# Patient Record
Sex: Female | Born: 1935 | Race: Black or African American | Hispanic: No
Health system: Southern US, Community
[De-identification: ages and names within clinical notes are randomized; demographics above are authoritative.]

## PROBLEM LIST (undated history)

## (undated) DIAGNOSIS — E119 Type 2 diabetes mellitus without complications: Secondary | ICD-10-CM

## (undated) DIAGNOSIS — J449 Chronic obstructive pulmonary disease, unspecified: Secondary | ICD-10-CM

## (undated) DIAGNOSIS — I10 Essential (primary) hypertension: Secondary | ICD-10-CM

## (undated) DIAGNOSIS — N186 End stage renal disease: Secondary | ICD-10-CM

## (undated) DIAGNOSIS — N189 Chronic kidney disease, unspecified: Secondary | ICD-10-CM

## (undated) DIAGNOSIS — E785 Hyperlipidemia, unspecified: Secondary | ICD-10-CM

## (undated) HISTORY — PX: ABDOMINAL HYSTERECTOMY: SHX81

## (undated) HISTORY — DX: Hyperlipidemia, unspecified: E78.5

## (undated) HISTORY — DX: Type 2 diabetes mellitus without complications: E11.9

## (undated) HISTORY — DX: Chronic kidney disease, unspecified: N18.9

## (undated) HISTORY — DX: Chronic obstructive pulmonary disease, unspecified: J44.9

## (undated) HISTORY — DX: Essential (primary) hypertension: I10

## (undated) HISTORY — DX: End stage renal disease: N18.6

## (undated) HISTORY — PX: AV FISTULA PLACEMENT: SHX1204

---

## 2020-03-06 ENCOUNTER — Other Ambulatory Visit: Payer: Self-pay

## 2020-03-06 DIAGNOSIS — G458 Other transient cerebral ischemic attacks and related syndromes: Secondary | ICD-10-CM

## 2020-03-21 ENCOUNTER — Other Ambulatory Visit: Payer: Self-pay

## 2020-03-21 ENCOUNTER — Ambulatory Visit (INDEPENDENT_AMBULATORY_CARE_PROVIDER_SITE_OTHER): Payer: Medicare (Managed Care) | Admitting: Vascular Surgery

## 2020-03-21 ENCOUNTER — Encounter: Payer: Self-pay | Admitting: *Deleted

## 2020-03-21 ENCOUNTER — Other Ambulatory Visit: Payer: Self-pay | Admitting: Vascular Surgery

## 2020-03-21 ENCOUNTER — Ambulatory Visit (HOSPITAL_COMMUNITY)
Admission: RE | Admit: 2020-03-21 | Discharge: 2020-03-21 | Disposition: A | Discharge status: Home / Self Care | Payer: Medicare (Managed Care) | Source: Ambulatory Visit | Attending: Vascular Surgery | Admitting: Vascular Surgery

## 2020-03-21 ENCOUNTER — Other Ambulatory Visit: Payer: Self-pay | Admitting: *Deleted

## 2020-03-21 ENCOUNTER — Encounter: Payer: Self-pay | Admitting: Vascular Surgery

## 2020-03-21 VITALS — BP 126/76 | HR 83 | Temp 97.8°F | Resp 20 | Ht 64.0 in | Wt 148.0 lb

## 2020-03-21 DIAGNOSIS — N186 End stage renal disease: Secondary | ICD-10-CM

## 2020-03-21 DIAGNOSIS — G458 Other transient cerebral ischemic attacks and related syndromes: Secondary | ICD-10-CM | POA: Insufficient documentation

## 2020-03-21 DIAGNOSIS — Z992 Dependence on renal dialysis: Secondary | ICD-10-CM | POA: Diagnosis not present

## 2020-03-21 NOTE — Progress Notes (Signed)
REASON FOR CONSULT:    Steal symptoms left upper extremity.  The consult is requested by Dr. Corliss Parish.  ASSESSMENT & PLAN:   STEAL SYNDROME: This patient has steal symptoms related to her upper arm fistula in the left arm.  In addition the fistula has been difficult to cannulate and she had a tunneled dialysis catheter placed in June.  I have recommended that we proceed with a fistulogram to evaluate our options to try to salvage the fistula.  I think we could band the fistula and help relieve her steal symptoms but if the fistula currently is difficult to cannulate this would only make things worse.  The difficulty with cannulation may be related to the tortuosity of the vein.  Based on her fistulogram she may be a candidate to have the fistula revised and retunneled to straighten it at the same time as banding.  If the fistula is not salvageable and we would have to ligate the fistula in place access in the right arm.  Veins in the right arm are somewhat marginal.  I will make further recommendations pending results of her fistulogram which is scheduled for 03/30/2020.  I have reviewed the indications for the procedure and the potential complications with the patient and her daughter.  Deitra Mayo, MD Office: [phone]   HPI:   Stacey Dorsey is a pleasant 86 y.o. female, who had a left brachiocephalic fistula placed in [place] about a year and a half ago.  She was having paresthesias in her hand ever since the fistula was placed.  The symptoms have been stable over the last year and a half.  However she began dialysis 3 months ago and the pain was worse when she was on dialysis.  In addition they had a hard time cannulating her fistula and for this reason a tunneled dialysis catheter was placed on 01/05/2020.  She is continuing to have pain and paresthesias in her left hand.  She is had no recent uremic symptoms.  Specifically, she denies nausea, vomiting, fatigue, anorexia, or  palpitations.  Past Medical History:  Diagnosis Date  . Chronic kidney disease   . COPD (chronic obstructive pulmonary disease) ([place])   . Diabetes mellitus without complication ([place])   . Hyperlipidemia   . Hypertension     History reviewed. No pertinent family history.  SOCIAL HISTORY: Social History   Socioeconomic History  . Marital status: Married    Spouse name: Not on file  . Number of children: Not on file  . Years of education: Not on file  . Highest education level: Not on file  Occupational History  . Not on file  Tobacco Use  . Smoking status: Never Smoker  . Smokeless tobacco: Never Used  Vaping Use  . Vaping Use: Never used  Substance and Sexual Activity  . Alcohol use: Not on file    Comment: occasional wine  . Drug use: Never  . Sexual activity: Not on file  Other Topics Concern  . Not on file  Social History Narrative  . Not on file   Social Determinants of Health   Financial Resource Strain:   . Difficulty of Paying Living Expenses: Not on file  Food Insecurity:   . Worried About Charity fundraiser in the Last Year: Not on file  . Ran Out of Food in the Last Year: Not on file  Transportation Needs:   . Lack of Transportation (Medical): Not on file  . Lack of Transportation (Non-Medical):  Not on file  Physical Activity:   . Days of Exercise per Week: Not on file  . Minutes of Exercise per Session: Not on file  Stress:   . Feeling of Stress : Not on file  Social Connections:   . Frequency of Communication with Friends and Family: Not on file  . Frequency of Social Gatherings with Friends and Family: Not on file  . Attends Religious Services: Not on file  . Active Member of Clubs or Organizations: Not on file  . Attends Archivist Meetings: Not on file  . Marital Status: Not on file  Intimate Partner Violence:   . Fear of Current or Ex-Partner: Not on file  . Emotionally Abused: Not on file  . Physically Abused: Not on file  .  Sexually Abused: Not on file    Allergies  Allergen Reactions  . Penicillins Rash    Current Outpatient Medications  Medication Sig Dispense Refill  . albuterol (PROVENTIL) (2.5 MG/3ML) 0.083% nebulizer solution Take by nebulization.    Marland Kitchen allopurinol (ZYLOPRIM) 100 MG tablet Take 100 mg by mouth daily.    . carvedilol (COREG) 25 MG tablet Take by mouth.    . esomeprazole (NEXIUM) 20 MG capsule Take by mouth.    . fluticasone-salmeterol (ADVAIR HFA) 115-21 MCG/ACT inhaler Inhale 2 puffs into the lungs 2 (two) times daily.    . insulin degludec (TRESIBA FLEXTOUCH) 100 UNIT/ML FlexTouch Pen Inject into the skin.    . multivitamin (RENA-VIT) TABS tablet Take 1 tablet by mouth daily.    Marland Kitchen NIFEdipine (PROCARDIA XL/NIFEDICAL XL) 60 MG 24 hr tablet Take 60 mg by mouth daily.    Marland Kitchen rOPINIRole (REQUIP) 0.5 MG tablet Take 0.5 mg by mouth at bedtime as needed.    . simvastatin (ZOCOR) 20 MG tablet Take 20 mg by mouth at bedtime.    Marland Kitchen VITAMIN D PO Take by mouth.     No current facility-administered medications for this visit.    REVIEW OF SYSTEMS:  [X]  denotes positive finding, [ ]  denotes negative finding Cardiac  Comments:  Chest pain or chest pressure:    Shortness of breath upon exertion:    Short of breath when lying flat:    Irregular heart rhythm:        Vascular    Pain in calf, thigh, or hip brought on by ambulation:    Pain in feet at night that wakes you up from your sleep:     Blood clot in your veins:    Leg swelling:         Pulmonary    Oxygen at home:    Productive cough:     Wheezing:         Neurologic    Sudden weakness in arms or legs:     Sudden numbness in arms or legs:     Sudden onset of difficulty speaking or slurred speech:    Temporary loss of vision in one eye:     Problems with dizziness:         Gastrointestinal    Blood in stool:     Vomited blood:         Genitourinary    Burning when urinating:     Blood in urine:        Psychiatric     Major depression:         Hematologic    Bleeding problems:    Problems with blood clotting too easily:  Skin    Rashes or ulcers:        Constitutional    Fever or chills:     PHYSICAL EXAM:   Vitals:   03/21/20 1243  BP: 126/76  Pulse: 83  Resp: 20  Temp: 97.8 F (36.6 C)  SpO2: 96%  Weight: 67.1 kg  Height: 5\' 4"  (1.626 m)    GENERAL: The patient is a well-nourished female, in no acute distress. The vital signs are documented above. CARDIAC: There is a regular rate and rhythm.  VASCULAR: I do not detect carotid bruits. She has a palpable right radial pulse. I do not palpate a left radial pulse. She has a thrill in her proximal fistula.  The fistula was more difficult to follow distally.  The fistula is somewhat tortuous. She has a radial and ulnar signal in the left hand which is monophasic which augments significantly with compression of her fistula. PULMONARY: There is good air exchange bilaterally without wheezing or rales. ABDOMEN: Soft and non-tender with normal pitched bowel sounds.  MUSCULOSKELETAL: There are no major deformities or cyanosis. NEUROLOGIC: No focal weakness or paresthesias are detected. SKIN: There are no ulcers or rashes noted. PSYCHIATRIC: The patient has a normal affect.  DATA:    UPPER EXTREMITY VEIN MAP: I have independently interpreted her upper extremity vein map of the right upper extremity.  The diameters of the cephalic vein in the right upper extremity range from 0.15-0.27 cm.  Thus the vein is marginal in size.  The basilic vein in the right arm ranges from 0.32-0.38 cm.  Thus she might be a candidate for basilic vein transposition based on her vein map.

## 2020-03-28 ENCOUNTER — Other Ambulatory Visit (HOSPITAL_COMMUNITY)
Admission: RE | Admit: 2020-03-28 | Discharge: 2020-03-28 | Disposition: A | Discharge status: Home / Self Care | Payer: Medicare (Managed Care) | Source: Ambulatory Visit | Attending: Vascular Surgery | Admitting: Vascular Surgery

## 2020-03-28 DIAGNOSIS — Z01812 Encounter for preprocedural laboratory examination: Secondary | ICD-10-CM | POA: Insufficient documentation

## 2020-03-28 DIAGNOSIS — Z20822 Contact with and (suspected) exposure to covid-19: Secondary | ICD-10-CM | POA: Diagnosis not present

## 2020-03-28 LAB — SARS CORONAVIRUS 2 (TAT 6-24 HRS): SARS Coronavirus 2: NEGATIVE

## 2020-03-29 ENCOUNTER — Telehealth: Payer: Self-pay

## 2020-03-29 NOTE — Telephone Encounter (Signed)
Pt's daughter [name]) called to cx pt's procedure tomorrow, as she is [facility] is out of network. MD is aware.

## 2020-03-30 ENCOUNTER — Ambulatory Visit (HOSPITAL_COMMUNITY)
Admission: RE | Admit: 2020-03-30 | Payer: Medicare (Managed Care) | Source: Ambulatory Visit | Admitting: Vascular Surgery

## 2020-03-30 ENCOUNTER — Encounter (HOSPITAL_COMMUNITY): Admission: RE | Payer: Self-pay | Source: Ambulatory Visit

## 2020-03-30 SURGERY — A/V FISTULAGRAM
Anesthesia: LOCAL

## 2020-09-04 ENCOUNTER — Other Ambulatory Visit (HOSPITAL_COMMUNITY): Payer: Self-pay | Admitting: Nephrology

## 2020-09-04 DIAGNOSIS — N186 End stage renal disease: Secondary | ICD-10-CM

## 2020-09-07 ENCOUNTER — Other Ambulatory Visit: Payer: Self-pay

## 2020-09-07 ENCOUNTER — Ambulatory Visit (HOSPITAL_COMMUNITY)
Admission: RE | Admit: 2020-09-07 | Discharge: 2020-09-07 | Disposition: A | Discharge status: Home / Self Care | Payer: Medicare (Managed Care) | Source: Ambulatory Visit | Attending: Nephrology | Admitting: Nephrology

## 2020-09-07 ENCOUNTER — Other Ambulatory Visit (HOSPITAL_COMMUNITY): Payer: Self-pay | Admitting: Nephrology

## 2020-09-07 DIAGNOSIS — Z4901 Encounter for fitting and adjustment of extracorporeal dialysis catheter: Secondary | ICD-10-CM | POA: Diagnosis not present

## 2020-09-07 DIAGNOSIS — N186 End stage renal disease: Secondary | ICD-10-CM

## 2020-09-07 HISTORY — PX: IR REMOVAL TUN CV CATH W/O FL: IMG2289

## 2020-09-07 MED ORDER — LIDOCAINE HCL 1 % IJ SOLN
INTRAMUSCULAR | Status: AC
  Filled 2020-09-07: qty 20

## 2020-09-07 MED ORDER — CHLORHEXIDINE GLUCONATE 4 % EX LIQD
CUTANEOUS | Status: AC
  Filled 2020-09-07: qty 15

## 2020-09-07 MED ORDER — LIDOCAINE HCL (PF) 1 % IJ SOLN
INTRAMUSCULAR | Status: DC | PRN
  Administered 2020-09-07: 10 mL

## 2020-09-07 NOTE — Procedures (Signed)
Successful removal of right IJ tunneled HD catheter.   After obtaining consent and performing a time-out, the right upper chest was prepped and draped in the normal sterile fashion. The heparin was removed from both ports. 1% lidocaine was used for local anesthesia. Using gentle blunt dissection the cuff of the catheter was exposed and the catheter was removed in its entirety. Pressure was held until hemostasis was obtained. A sterile dressing was applied. The patient tolerated the procedure well with no immediate complications.    Soyla Dryer, [place] [phone] 09/07/2020, 12:44 PM

## 2020-12-31 ENCOUNTER — Emergency Department (HOSPITAL_COMMUNITY)
Admission: EM | Admit: 2020-12-31 | Discharge: 2021-01-01 | Disposition: A | Discharge status: Home / Self Care | Payer: Medicare (Managed Care) | Attending: Emergency Medicine | Admitting: Emergency Medicine

## 2020-12-31 ENCOUNTER — Emergency Department (HOSPITAL_COMMUNITY): Payer: Medicare (Managed Care)

## 2020-12-31 DIAGNOSIS — Z794 Long term (current) use of insulin: Secondary | ICD-10-CM | POA: Insufficient documentation

## 2020-12-31 DIAGNOSIS — N186 End stage renal disease: Secondary | ICD-10-CM | POA: Diagnosis not present

## 2020-12-31 DIAGNOSIS — J449 Chronic obstructive pulmonary disease, unspecified: Secondary | ICD-10-CM | POA: Insufficient documentation

## 2020-12-31 DIAGNOSIS — R2243 Localized swelling, mass and lump, lower limb, bilateral: Secondary | ICD-10-CM | POA: Insufficient documentation

## 2020-12-31 DIAGNOSIS — R0603 Acute respiratory distress: Secondary | ICD-10-CM | POA: Insufficient documentation

## 2020-12-31 DIAGNOSIS — I120 Hypertensive chronic kidney disease with stage 5 chronic kidney disease or end stage renal disease: Secondary | ICD-10-CM | POA: Insufficient documentation

## 2020-12-31 DIAGNOSIS — J810 Acute pulmonary edema: Secondary | ICD-10-CM | POA: Diagnosis not present

## 2020-12-31 DIAGNOSIS — Z992 Dependence on renal dialysis: Secondary | ICD-10-CM | POA: Diagnosis not present

## 2020-12-31 DIAGNOSIS — Z20822 Contact with and (suspected) exposure to covid-19: Secondary | ICD-10-CM | POA: Diagnosis not present

## 2020-12-31 DIAGNOSIS — R0602 Shortness of breath: Secondary | ICD-10-CM | POA: Diagnosis present

## 2020-12-31 DIAGNOSIS — I160 Hypertensive urgency: Secondary | ICD-10-CM | POA: Diagnosis not present

## 2020-12-31 IMAGING — DX DG CHEST 1V PORT
1 series · 1 of 1 positions shown · non-contrast
Comparison: None.

CLINICAL DATA: Dyspnea, respiratory distress

EXAM:
PORTABLE CHEST 1 VIEW

[chest]
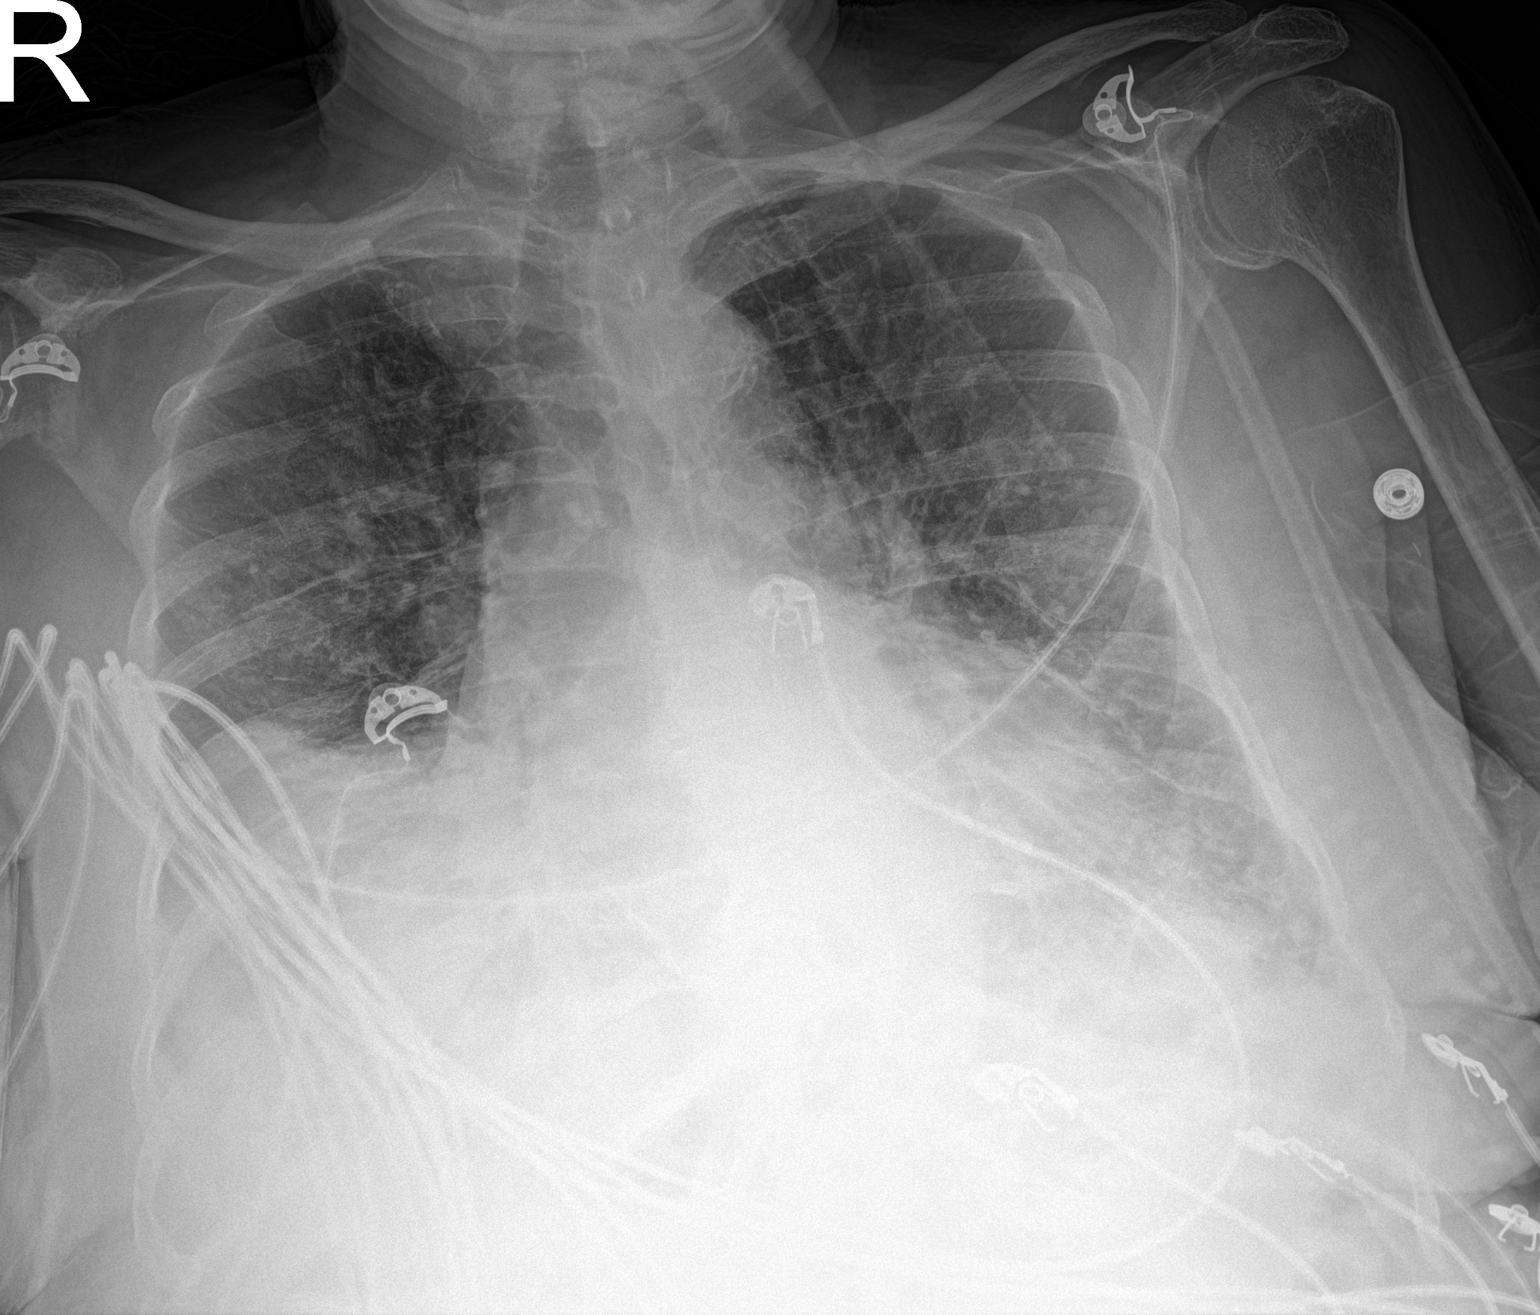

[1 of 1 positions shown; findings below may reference images not displayed]

FINDINGS: There is left basilar opacification which may relate to eventration
of the right hemidiaphragm, left pleural effusion, or a pleural
based pulmonary mass. Mild bronchial wall thickening in keeping with
airway inflammation changes are noted within the left mid and lower
lung zone. No confluent pulmonary infiltrate. No pneumothorax. No
pleural effusion on the left. Mild cardiomegaly is present.
Pulmonary vascularity is normal. No acute bone abnormality.
IMPRESSION: Left basilar opacification representing eventration of the right
hemidiaphragm, basilar pleural effusion, or pleural based mass. This
would be better assessed with a standard two view chest radiograph
or chest CT.

Findings in keeping with airway inflammation within the left mid and
lower lung zone.

## 2020-12-31 NOTE — Progress Notes (Signed)
PT on cpap via EMS on arrival, per MD PT placed on BIPAP. Tolerating will at this time.

## 2021-01-01 ENCOUNTER — Emergency Department (HOSPITAL_COMMUNITY): Payer: Medicare (Managed Care)

## 2021-01-01 LAB — CBC WITH DIFFERENTIAL/PLATELET
Abs Immature Granulocytes: 0.07 10*3/uL (ref 0.00–0.07)
Basophils Absolute: 0.1 10*3/uL (ref 0.0–0.1)
Basophils Relative: 0 %
Eosinophils Absolute: 0.4 10*3/uL (ref 0.0–0.5)
Eosinophils Relative: 3 %
HCT: 33.5 % — ABNORMAL LOW (ref 36.0–46.0)
Hemoglobin: 10.2 g/dL — ABNORMAL LOW (ref 12.0–15.0)
Immature Granulocytes: 1 %
Lymphocytes Relative: 37 %
Lymphs Abs: 5.1 10*3/uL — ABNORMAL HIGH (ref 0.7–4.0)
MCH: 30 pg (ref 26.0–34.0)
MCHC: 30.4 g/dL (ref 30.0–36.0)
MCV: 98.5 fL (ref 80.0–100.0)
Monocytes Absolute: 1.1 10*3/uL — ABNORMAL HIGH (ref 0.1–1.0)
Monocytes Relative: 8 %
Neutro Abs: 7.3 10*3/uL (ref 1.7–7.7)
Neutrophils Relative %: 51 %
Platelets: 194 10*3/uL (ref 150–400)
RBC: 3.4 MIL/uL — ABNORMAL LOW (ref 3.87–5.11)
RDW: 15.2 % (ref 11.5–15.5)
WBC: 14 10*3/uL — ABNORMAL HIGH (ref 4.0–10.5)
nRBC: 0 % (ref 0.0–0.2)

## 2021-01-01 LAB — BASIC METABOLIC PANEL
Anion gap: 14 (ref 5–15)
BUN: 63 mg/dL — ABNORMAL HIGH (ref 8–23)
CO2: 24 mmol/L (ref 22–32)
Calcium: 8.5 mg/dL — ABNORMAL LOW (ref 8.9–10.3)
Chloride: 102 mmol/L (ref 98–111)
Creatinine, Ser: 8.94 mg/dL — ABNORMAL HIGH (ref 0.44–1.00)
GFR, Estimated: 4 mL/min — ABNORMAL LOW (ref >60)
Glucose, Bld: 286 mg/dL — ABNORMAL HIGH (ref 70–99)
Potassium: 4.4 mmol/L (ref 3.5–5.1)
Sodium: 140 mmol/L (ref 135–145)

## 2021-01-01 LAB — RESP PANEL BY RT-PCR (FLU A&B, COVID) ARPGX2
Influenza A by PCR: NEGATIVE
Influenza B by PCR: NEGATIVE
SARS Coronavirus 2 by RT PCR: NEGATIVE

## 2021-01-01 IMAGING — CT CT CHEST W/O CM
2 of 3 series · 15 of 36 positions shown, 18 images · non-contrast
Comparison: None.

CLINICAL DATA: Abnormal x-ray.  Pleural effusion.

EXAM:
CT CHEST WITHOUT CONTRAST
TECHNIQUE: Multidetector CT imaging of the chest was performed following the
standard protocol without IV contrast.

[Series 3: chest wo · axial · 0.78mm/px · z∈[+1181,+1415]mm · 12 of 139 slices shown, 15 images]
[im 11/139  mediastinal]
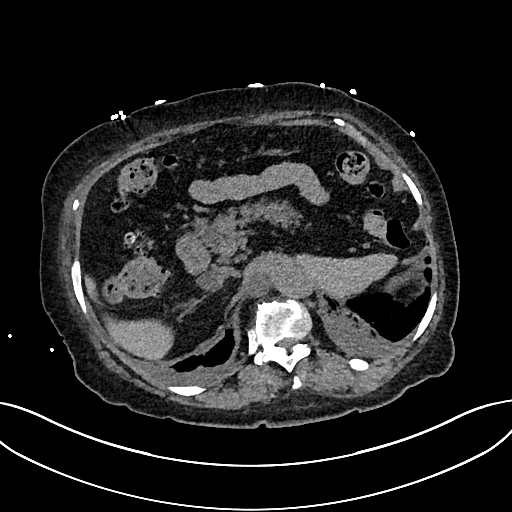
[im 11/139  lung]
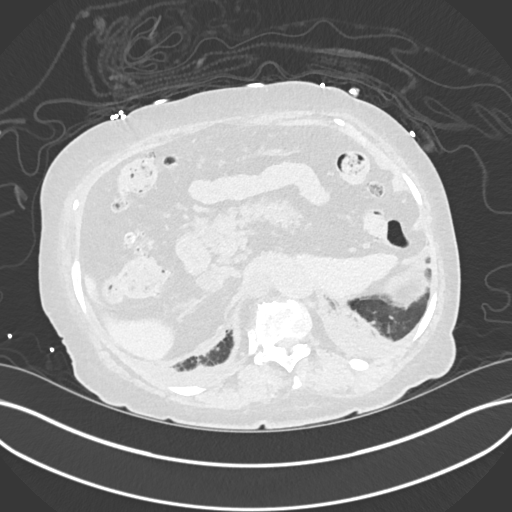
[im 21/139  lung]
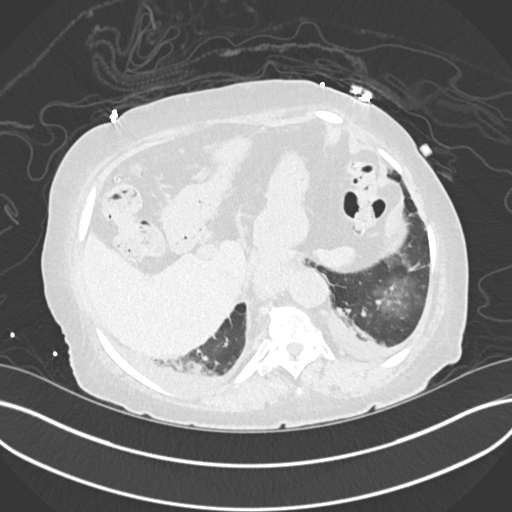
[im 31/139  lung]
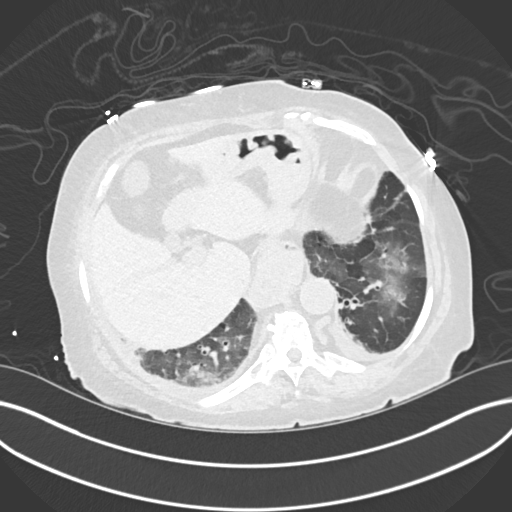
[im 41/139  lung]
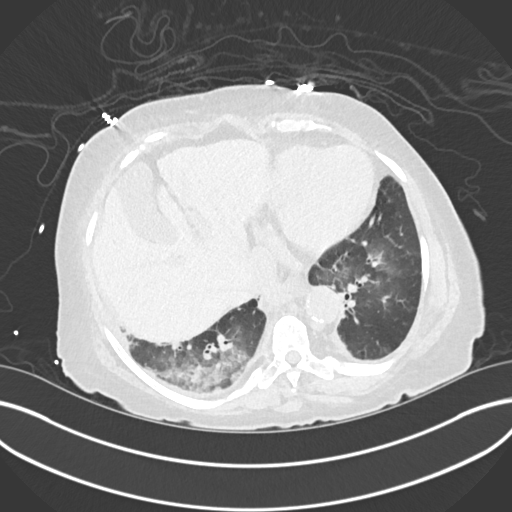
[im 52/139  mediastinal]
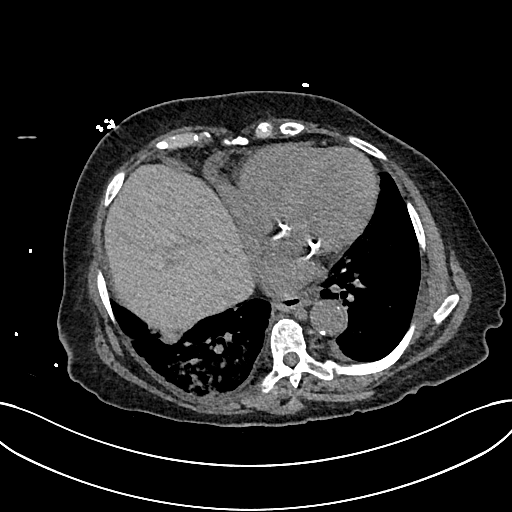
[im 52/139  lung]
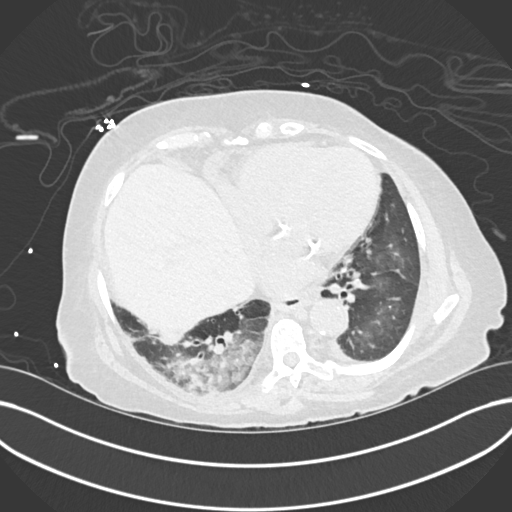
[im 62/139  lung]
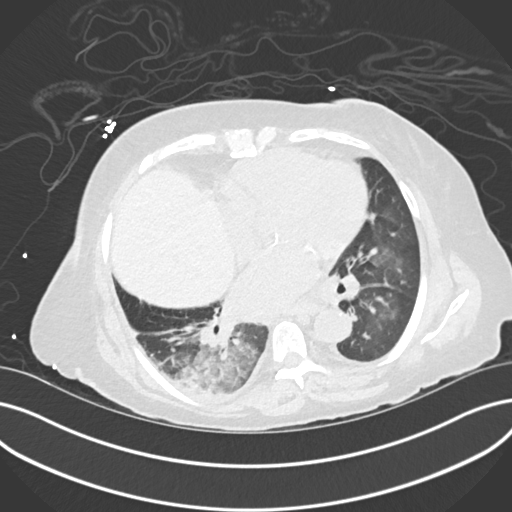
[im 77/139  lung]
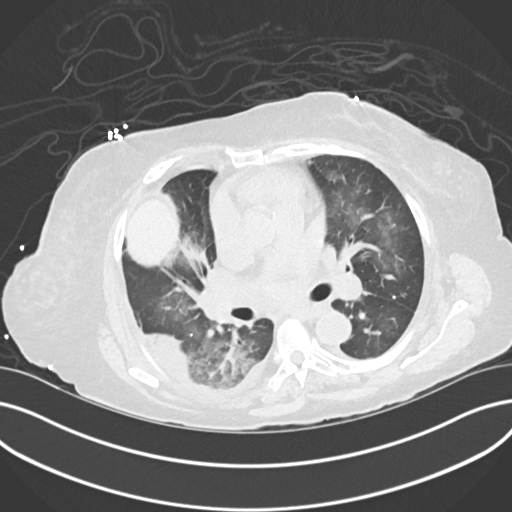
[im 87/139  lung]
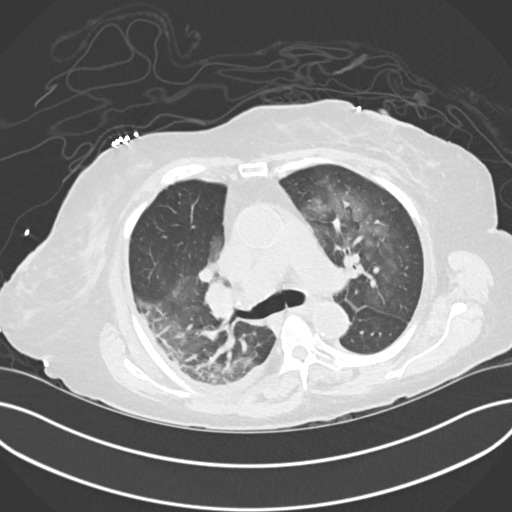
[im 98/139  mediastinal]
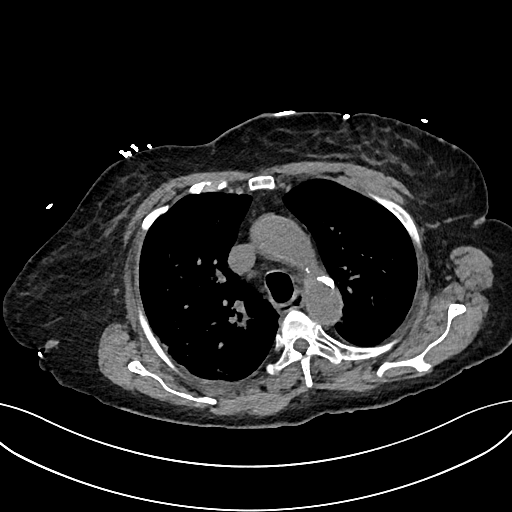
[im 98/139  lung]
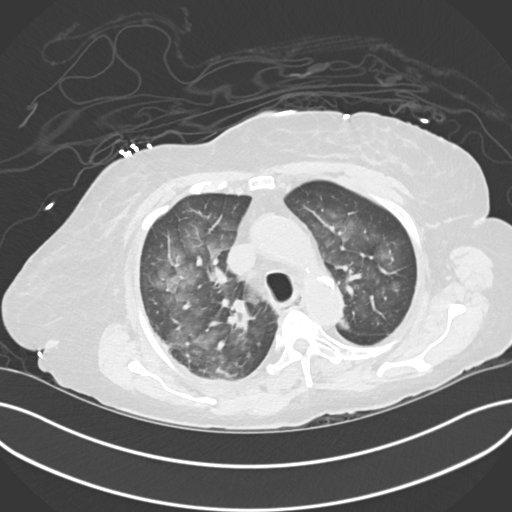
[im 108/139  lung]
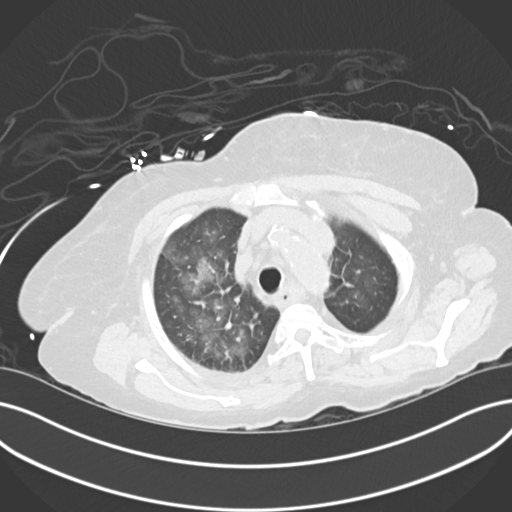
[im 118/139  lung]
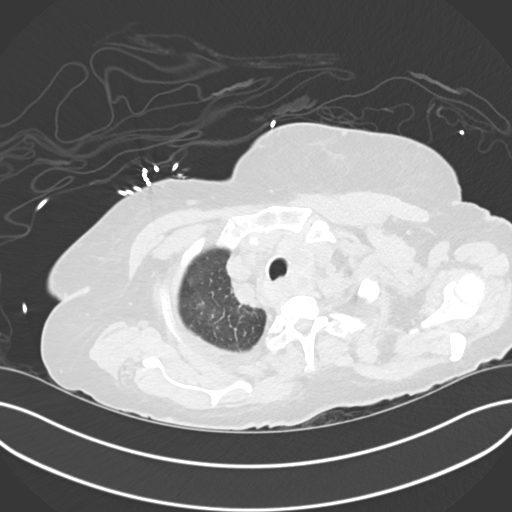
[im 128/139  lung]
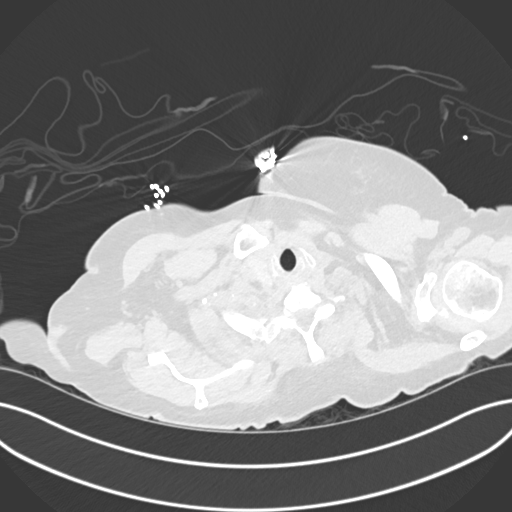

[Series 6: cor · coronal · 0.64mm/px · 3 of 138 slices shown]
[im 28/138  lung]
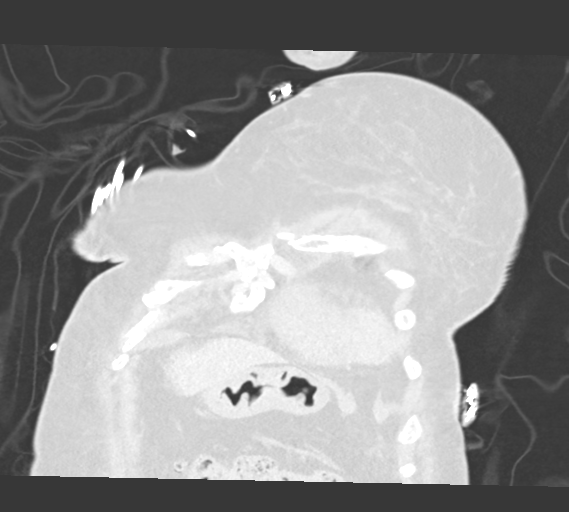
[im 55/138  lung]
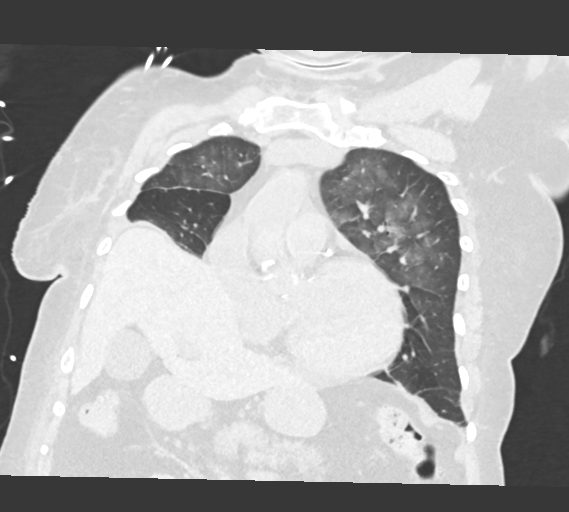
[im 83/138  lung]
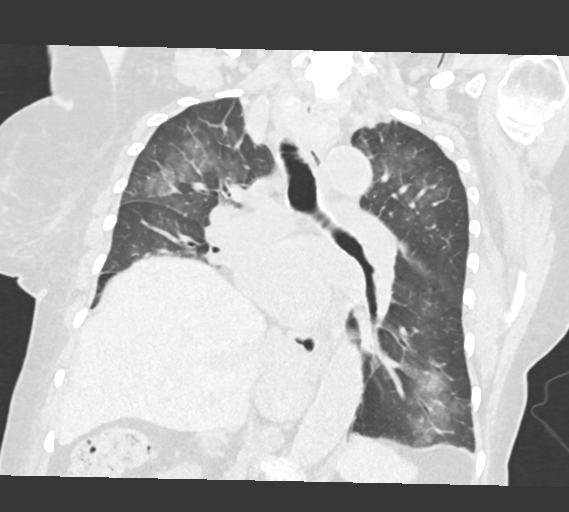

[15 of 36 positions shown; findings below may reference images not displayed]

FINDINGS: Cardiovascular: Normal heart size. No pericardial effusion. Aortic
and coronary atheromatous calcification. Mitral annular
calcification.

Mediastinum/Nodes: Small sliding hiatal hernia. Calcified right
hilar lymph nodes. Lobulated and enlarged thyroid with nodule
measuring up to 13 mm in the left upper pole. Left lower pole nodule
measures 14 mm. There is superior mediastinal extension. No overt
invasive features or regional adenopathy.

Lungs/Pleura: Airspace opacity in all lobes, greatest on the right
and at the lower lobe. Small pleural effusions. There is
interlobular septal thickening and fissure thickening. The right
diaphragm is elevated.

Calcified left upper lobe nodules

Upper Abdomen: Colonic diverticulosis.

Musculoskeletal: Advanced lower thoracic disc degeneration.
Scoliosis.
IMPRESSION: 1. Airspace disease, small pleural effusions, and interstitial
thickening favoring failure over atypical infection.
2. Nodular goiter. No follow-up recommended unless clinically
warranted (ref: [HOSPITAL]. [DATE]): 143-50).
3. Aortic Atherosclerosis ([38]-[38]).  Coronary atherosclerosis.

## 2021-01-01 NOTE — ED Notes (Signed)
Patient verbalizes understanding of discharge instructions. Opportunity for questioning and answers were provided. Armband removed by staff, pt discharged from ED and ambulated to lobby to return home with family.  

## 2021-01-01 NOTE — ED Notes (Signed)
Ambulated O2 95% HR 73

## 2021-01-01 NOTE — ED Provider Notes (Signed)
[facility] EMERGENCY DEPARTMENT Provider Note   CSN: [id number] Arrival date & time: 12/31/20  2326     History Chief Complaint  Patient presents with  . Respiratory Distress    Stacey Dorsey is a 87 y.o. female.  HPI     This is an 87 year old female with a history of chronic kidney disease on dialysis Tuesday, Thursday, Saturday, COPD, diabetes, hypertension, hyperlipidemia who presents with shortness of breath.  Per EMS, they were called out for acute onset shortness of breath.  Patient reportedly had a "good day" but 30 minutes prior to arrival had acute onset of shortness of breath.  She went out of town this past weekend and her dialysis schedule was altered last week to accommodate.  She had dialysis on Tuesday, Thursday, and Friday.  She is due for dialysis Tuesday morning.  She is not had any fevers or cough.  No chest pain.  Per EMS, her O2 sats were in the 60s upon arrival.  She does not wear oxygen.  She was administered 1 dose of albuterol by EMS.  She was placed on CPAP for oxygenation with significant clinical improvement in route.  Level 5 caveat for acuity of condition  Past Medical History:  Diagnosis Date  . Chronic kidney disease   . COPD (chronic obstructive pulmonary disease) ([place])   . Diabetes mellitus without complication ([place])   . Hyperlipidemia   . Hypertension     There are no problems to display for this patient.   Past Surgical History:  Procedure Laterality Date  . ABDOMINAL HYSTERECTOMY    . AV FISTULA PLACEMENT    . IR REMOVAL TUN CV CATH W/O FL  09/07/2020     OB History   No obstetric history on file.     No family history on file.  Social History   Tobacco Use  . Smoking status: Never Smoker  . Smokeless tobacco: Never Used  Vaping Use  . Vaping Use: Never used  Substance Use Topics  . Drug use: Never    Home Medications Prior to Admission medications   Medication Sig Start Date End Date Taking?  Authorizing Provider  albuterol (PROVENTIL) (2.5 MG/3ML) 0.083% nebulizer solution Take 2.5 mg by nebulization every 6 (six) hours as needed for wheezing or shortness of breath.  02/16/20   [provider]  carvedilol (COREG) 25 MG tablet Take 12.5 mg by mouth See admin instructions. Take 12.5 mg twice daily on nondailysis days (Sun, Mon, Wed, and Fri) 02/16/20   [provider]  fluticasone-salmeterol (ADVAIR HFA) 115-21 MCG/ACT inhaler Inhale 1 puff into the lungs daily as needed (shortness of breath).  02/17/20   [provider]  ibuprofen (ADVIL) 200 MG tablet Take 200 mg by mouth every 8 (eight) hours as needed for moderate pain.    [provider]  insulin degludec (TRESIBA FLEXTOUCH) 100 UNIT/ML FlexTouch Pen Inject 10 Units into the skin at bedtime.  02/15/20   [provider]  Menthol-Methyl Salicylate (MUSCLE RUB) 10-15 % CREA Apply 1 application topically as needed for muscle pain.    [provider]  NIFEdipine (PROCARDIA XL/NIFEDICAL XL) 60 MG 24 hr tablet Take 60 mg by mouth at bedtime.  02/16/20   [provider]  omeprazole (PRILOSEC) 20 MG capsule Take 40 mg by mouth 4 (four) times a week. On Sun, Mon, Wed, and Fri (nondialysis days)    [provider]  Polyethyl Glycol-Propyl Glycol (SYSTANE OP) Place 1 drop into  both eyes daily as needed (dry eyes).    [provider]  rOPINIRole (REQUIP) 0.5 MG tablet Take 0.5 mg by mouth at bedtime.  02/15/20   [provider]  Sennosides (SENNA) 8.6 MG CAPS Take 1 tablet by mouth daily.    [provider]  simvastatin (ZOCOR) 20 MG tablet Take 20 mg by mouth at bedtime. 02/22/20   [provider]    Allergies    Penicillins and Scallops [shellfish allergy]  Review of Systems   Review of Systems  Constitutional: Negative for fever.  Respiratory: Positive for shortness of breath. Negative for cough.   Cardiovascular: Negative for chest pain  and leg swelling.  Gastrointestinal: Negative for abdominal pain, nausea and vomiting.  All other systems reviewed and are negative.   Physical Exam Updated Vital Signs BP (!) 136/54   Pulse 85   Temp 97.8 F (36.6 C) (Oral)   Resp (!) 23   SpO2 97%   Physical Exam Vitals and nursing note reviewed.  Constitutional:      Appearance: She is well-developed. She is obese.     Comments: Ill-appearing but nontoxic  HENT:     Head: Normocephalic and atraumatic.     Mouth/Throat:     Mouth: Mucous membranes are moist.  Eyes:     Pupils: Pupils are equal, round, and reactive to light.  Cardiovascular:     Rate and Rhythm: Normal rate and regular rhythm.     Heart sounds: Normal heart sounds.  Pulmonary:     Effort: Pulmonary effort is normal. No respiratory distress.     Breath sounds: No wheezing.     Comments: Coarse breath sounds in all lung fields, no wheezing noted, CPAP in place, patient appears comfortable Abdominal:     General: Bowel sounds are normal.     Palpations: Abdomen is soft.     Tenderness: There is no guarding or rebound.  Musculoskeletal:     Cervical back: Neck supple.     Right lower leg: Edema present.     Left lower leg: Edema present.  Skin:    General: Skin is warm and dry.  Neurological:     Mental Status: She is alert and oriented to person, place, and time.  Psychiatric:        Mood and Affect: Mood normal.     ED Results / Procedures / Treatments   Labs (all labs ordered are listed, but only abnormal results are displayed) Labs Reviewed  CBC WITH DIFFERENTIAL/PLATELET - Abnormal; Notable for the following components:      Result Value   WBC 14.0 (*)    RBC 3.40 (*)    Hemoglobin 10.2 (*)    HCT 33.5 (*)    Lymphs Abs 5.1 (*)    Monocytes Absolute 1.1 (*)    All other components within normal limits  BASIC METABOLIC PANEL - Abnormal; Notable for the following components:   Glucose, Bld 286 (*)    BUN 63 (*)    Creatinine, Ser 8.94  (*)    Calcium 8.5 (*)    GFR, Estimated 4 (*)    All other components within normal limits  RESP PANEL BY RT-PCR (FLU A&B, COVID) [id number]    EKG EKG Interpretation  Date/Time:  Monday December 31 2020 23:39:35 EDT Ventricular Rate:  92 PR Interval:  171 QRS Duration: 84 QT Interval:  384 QTC Calculation: 475 R Axis:   54 Text Interpretation: Sinus rhythm Atrial premature complex Consider left  ventricular hypertrophy No prior for comparison Confirmed by Thayer Jew [phone]) on 01/01/2021 12:42:15 AM   Radiology CT Chest Wo Contrast  Result Date: 01/01/2021 CLINICAL DATA:  Abnormal x-ray.  Pleural effusion. EXAM: CT CHEST WITHOUT CONTRAST TECHNIQUE: Multidetector CT imaging of the chest was performed following the standard protocol without IV contrast. COMPARISON:  None. FINDINGS: Cardiovascular: Normal heart size. No pericardial effusion. Aortic and coronary atheromatous calcification. Mitral annular calcification. Mediastinum/Nodes: Small sliding hiatal hernia. Calcified right hilar lymph nodes. Lobulated and enlarged thyroid with nodule measuring up to 13 mm in the left upper pole. Left lower pole nodule measures 14 mm. There is superior mediastinal extension. No overt invasive features or regional adenopathy. Lungs/Pleura: Airspace opacity in all lobes, greatest on the right and at the lower lobe. Small pleural effusions. There is interlobular septal thickening and fissure thickening. The right diaphragm is elevated. Calcified left upper lobe nodules Upper Abdomen: Colonic diverticulosis. Musculoskeletal: Advanced lower thoracic disc degeneration. Scoliosis. IMPRESSION: 1. Airspace disease, small pleural effusions, and interstitial thickening favoring failure over atypical infection. 2. Nodular goiter. No follow-up recommended unless clinically warranted (ref: J Am Coll Radiol. 2015 Feb;12(2): 143-50). 3. Aortic Atherosclerosis (ICD10-I70.0).  Coronary atherosclerosis. Electronically Signed    By: Monte Fantasia M.D.   On: 01/01/2021 04:39   DG Chest Portable 1 View  Result Date: 01/01/2021 CLINICAL DATA:  Dyspnea, respiratory distress EXAM: PORTABLE CHEST 1 VIEW COMPARISON:  None. FINDINGS: There is left basilar opacification which may relate to eventration of the right hemidiaphragm, left pleural effusion, or a pleural based pulmonary mass. Mild bronchial wall thickening in keeping with airway inflammation changes are noted within the left mid and lower lung zone. No confluent pulmonary infiltrate. No pneumothorax. No pleural effusion on the left. Mild cardiomegaly is present. Pulmonary vascularity is normal. No acute bone abnormality. IMPRESSION: Left basilar opacification representing eventration of the right hemidiaphragm, basilar pleural effusion, or pleural based mass. This would be better assessed with a standard two view chest radiograph or chest CT. Findings in keeping with airway inflammation within the left mid and lower lung zone. Electronically Signed   By: Fidela Salisbury MD   On: 01/01/2021 00:30    Procedures .Critical Care Performed by: Merryl Hacker, MD Authorized by: Merryl Hacker, MD   Critical care provider statement:    Critical care time (minutes):  45   Critical care was necessary to treat or prevent imminent or life-threatening deterioration of the following conditions:  Respiratory failure   Critical care was time spent personally by me on the following activities:  Discussions with consultants, evaluation of patient's response to treatment, examination of patient, ordering and performing treatments and interventions, ordering and review of laboratory studies, ordering and review of radiographic studies, pulse oximetry, re-evaluation of patient's condition, obtaining history from patient or surrogate and review of old charts     Medications Ordered in ED Medications - No data to display  ED Course  I have reviewed the triage vital signs and the  nursing notes.  Pertinent labs & imaging results that were available during my care of the patient were reviewed by me and considered in my medical decision making (see chart for details).  Clinical Course as of 01/01/21 0515  Tue Jan 01, 2021  0511 Patient weaned to room air.  CT reviewed.  Consistent with fluid overload.  Patient's blood pressure more under control likely helping with improved respiratory state.  Discussed admission for dialysis.  Daughter and patient's preference is  that she go to her outpatient scheduled dialysis.  Requested nursing ambulate patient to ensure she did not drop her oxygen saturations precipitously. [CH]  [id number] Per nursing, patient ambulated without difficulty and maintained her O2 sats. [CH]    Clinical Course User Index [CH] Vladislav Axelson, Barbette Hair, MD   MDM Rules/Calculators/A&P                          Patient presents with acute hypoxia.  Required CPAP in route.  She is overall significantly hypertensive but nontoxic.  Denies infectious symptoms or chest pain.  EKG without acute ischemic changes.  Basic lab work including CBC and BMP were obtained.  Highly suspect pulmonary edema given physical exam findings and history.  Other considerations include but not limited to, pneumonia, pneumothorax, other atypical infection.  Doubt ACS.  Chest x-ray shows pleural effusion versus pleural-based mass.  Recommend CT.  Patient was able to come off of CPAP and transition to nasal cannula without difficulty.  Blood pressures have been downtrending without intervention.  Basic lab work shows no evidence of hyperkalemia and largely reassuring.  COVID testing is negative.  CT shows no evidence of mass.  CT favors pulmonary edema.  This would be consistent with need for dialysis as well as hypertension.  Patient likely had a component of hypertensive urgency on initial evaluation.  Again blood pressures have down trended without treatment and patient was weaned off of oxygen.   Discussed with patient and her daughter admission for dialysis as I feel this will likely completely correct the patient's presenting issue.  Both the daughter and the patient prefer for her to go to her scheduled dialysis this morning.  This is not unreasonable given that her vital signs of can significantly improved and her O2 sats are 97-98% on room air.  She did ambulate and maintain her O2 sats as well.  I highly suspect her initial presentation was likely a combination of hypertensive urgency and need for dialysis.  Based on chart review, she is a very compliant patient with her dialysis and does not have frequent visits to the emergency room for dialysis or blood pressure related issues.  Given stabilization and family preference, will discharge to dialysis this morning.  After history, exam, and medical workup I feel the patient has been appropriately medically screened and is safe for discharge home. Pertinent diagnoses were discussed with the patient. Patient was given return precautions.  Final Clinical Impression(s) / ED Diagnoses Final diagnoses:  Acute pulmonary edema ([place])  Hypertensive urgency  ESRD (end stage renal disease) ([place])    Rx / DC Orders ED Discharge Orders    None       Jamillah Camilo, Barbette Hair, MD 01/01/21 [phone]

## 2021-01-01 NOTE — ED Notes (Signed)
With RT at bedside, pt transferred from BiPap to O2 via  at 2 lpm. Pt's SpO2 maintained in the upper 90's when transferred to [facility]. Pt also laid supine without a drop in SpO2 or change in RR.

## 2021-01-01 NOTE — ED Notes (Signed)
Pt to CT

## 2021-01-01 NOTE — Discharge Instructions (Addendum)
You were seen today for shortness of breath.  This is likely related to fluid on your lungs.  This can sometimes be related to missing dialysis or high blood pressures.  It is very important that you go to your dialysis session today.  If you have recurrence of symptoms or worsening shortness of breath, you should be reevaluated.

## 2021-01-01 NOTE — ED Notes (Addendum)
Pt maintaining 100% on 2 L [place]. This RN, turned off 02. Pt 02 sat maintaining at 99%

## 2021-07-04 ENCOUNTER — Emergency Department (HOSPITAL_COMMUNITY): Payer: Medicare Other

## 2021-07-04 ENCOUNTER — Inpatient Hospital Stay (HOSPITAL_COMMUNITY)
Admission: EM | Admit: 2021-07-04 | Discharge: 2021-07-16 | DRG: 208 | Disposition: A | Discharge status: Rehabilitation Facility | Payer: Medicare Other | Attending: Internal Medicine | Admitting: Internal Medicine

## 2021-07-04 ENCOUNTER — Inpatient Hospital Stay (HOSPITAL_COMMUNITY): Payer: Medicare Other

## 2021-07-04 DIAGNOSIS — R0789 Other chest pain: Secondary | ICD-10-CM | POA: Diagnosis not present

## 2021-07-04 DIAGNOSIS — J189 Pneumonia, unspecified organism: Principal | ICD-10-CM | POA: Diagnosis present

## 2021-07-04 DIAGNOSIS — I639 Cerebral infarction, unspecified: Secondary | ICD-10-CM | POA: Diagnosis not present

## 2021-07-04 DIAGNOSIS — D696 Thrombocytopenia, unspecified: Secondary | ICD-10-CM | POA: Diagnosis present

## 2021-07-04 DIAGNOSIS — R29702 NIHSS score 2: Secondary | ICD-10-CM | POA: Diagnosis not present

## 2021-07-04 DIAGNOSIS — E785 Hyperlipidemia, unspecified: Secondary | ICD-10-CM | POA: Diagnosis present

## 2021-07-04 DIAGNOSIS — S2243XA Multiple fractures of ribs, bilateral, initial encounter for closed fracture: Secondary | ICD-10-CM | POA: Diagnosis present

## 2021-07-04 DIAGNOSIS — D509 Iron deficiency anemia, unspecified: Secondary | ICD-10-CM | POA: Diagnosis present

## 2021-07-04 DIAGNOSIS — I6340 Cerebral infarction due to embolism of unspecified cerebral artery: Secondary | ICD-10-CM | POA: Insufficient documentation

## 2021-07-04 DIAGNOSIS — M1A9XX0 Chronic gout, unspecified, without tophus (tophi): Secondary | ICD-10-CM | POA: Diagnosis present

## 2021-07-04 DIAGNOSIS — Z20822 Contact with and (suspected) exposure to covid-19: Secondary | ICD-10-CM | POA: Diagnosis present

## 2021-07-04 DIAGNOSIS — G9341 Metabolic encephalopathy: Secondary | ICD-10-CM | POA: Diagnosis present

## 2021-07-04 DIAGNOSIS — J9601 Acute respiratory failure with hypoxia: Secondary | ICD-10-CM | POA: Diagnosis not present

## 2021-07-04 DIAGNOSIS — Z4659 Encounter for fitting and adjustment of other gastrointestinal appliance and device: Secondary | ICD-10-CM

## 2021-07-04 DIAGNOSIS — E162 Hypoglycemia, unspecified: Secondary | ICD-10-CM | POA: Diagnosis not present

## 2021-07-04 DIAGNOSIS — I132 Hypertensive heart and chronic kidney disease with heart failure and with stage 5 chronic kidney disease, or end stage renal disease: Secondary | ICD-10-CM | POA: Diagnosis present

## 2021-07-04 DIAGNOSIS — J440 Chronic obstructive pulmonary disease with acute lower respiratory infection: Secondary | ICD-10-CM | POA: Diagnosis present

## 2021-07-04 DIAGNOSIS — Z8673 Personal history of transient ischemic attack (TIA), and cerebral infarction without residual deficits: Secondary | ICD-10-CM

## 2021-07-04 DIAGNOSIS — R778 Other specified abnormalities of plasma proteins: Secondary | ICD-10-CM

## 2021-07-04 DIAGNOSIS — Z880 Allergy status to penicillin: Secondary | ICD-10-CM

## 2021-07-04 DIAGNOSIS — E119 Type 2 diabetes mellitus without complications: Secondary | ICD-10-CM | POA: Diagnosis not present

## 2021-07-04 DIAGNOSIS — J9621 Acute and chronic respiratory failure with hypoxia: Secondary | ICD-10-CM | POA: Diagnosis present

## 2021-07-04 DIAGNOSIS — X58XXXA Exposure to other specified factors, initial encounter: Secondary | ICD-10-CM | POA: Diagnosis present

## 2021-07-04 DIAGNOSIS — N186 End stage renal disease: Secondary | ICD-10-CM | POA: Diagnosis present

## 2021-07-04 DIAGNOSIS — I63412 Cerebral infarction due to embolism of left middle cerebral artery: Secondary | ICD-10-CM | POA: Diagnosis not present

## 2021-07-04 DIAGNOSIS — K219 Gastro-esophageal reflux disease without esophagitis: Secondary | ICD-10-CM | POA: Diagnosis present

## 2021-07-04 DIAGNOSIS — E7220 Disorder of urea cycle metabolism, unspecified: Secondary | ICD-10-CM | POA: Diagnosis present

## 2021-07-04 DIAGNOSIS — N2581 Secondary hyperparathyroidism of renal origin: Secondary | ICD-10-CM | POA: Diagnosis present

## 2021-07-04 DIAGNOSIS — J9600 Acute respiratory failure, unspecified whether with hypoxia or hypercapnia: Secondary | ICD-10-CM

## 2021-07-04 DIAGNOSIS — I469 Cardiac arrest, cause unspecified: Secondary | ICD-10-CM | POA: Diagnosis present

## 2021-07-04 DIAGNOSIS — Z8249 Family history of ischemic heart disease and other diseases of the circulatory system: Secondary | ICD-10-CM

## 2021-07-04 DIAGNOSIS — I959 Hypotension, unspecified: Secondary | ICD-10-CM | POA: Diagnosis not present

## 2021-07-04 DIAGNOSIS — Z79899 Other long term (current) drug therapy: Secondary | ICD-10-CM

## 2021-07-04 DIAGNOSIS — Z992 Dependence on renal dialysis: Secondary | ICD-10-CM | POA: Diagnosis not present

## 2021-07-04 DIAGNOSIS — R4701 Aphasia: Secondary | ICD-10-CM | POA: Diagnosis not present

## 2021-07-04 DIAGNOSIS — E440 Moderate protein-calorie malnutrition: Secondary | ICD-10-CM | POA: Insufficient documentation

## 2021-07-04 DIAGNOSIS — G9389 Other specified disorders of brain: Secondary | ICD-10-CM | POA: Diagnosis present

## 2021-07-04 DIAGNOSIS — I6523 Occlusion and stenosis of bilateral carotid arteries: Secondary | ICD-10-CM | POA: Diagnosis present

## 2021-07-04 DIAGNOSIS — R29704 NIHSS score 4: Secondary | ICD-10-CM | POA: Diagnosis not present

## 2021-07-04 DIAGNOSIS — K5900 Constipation, unspecified: Secondary | ICD-10-CM | POA: Diagnosis not present

## 2021-07-04 DIAGNOSIS — R682 Dry mouth, unspecified: Secondary | ICD-10-CM | POA: Diagnosis not present

## 2021-07-04 DIAGNOSIS — I63512 Cerebral infarction due to unspecified occlusion or stenosis of left middle cerebral artery: Secondary | ICD-10-CM | POA: Diagnosis not present

## 2021-07-04 DIAGNOSIS — Z91013 Allergy to seafood: Secondary | ICD-10-CM

## 2021-07-04 DIAGNOSIS — Q211 Atrial septal defect, unspecified: Secondary | ICD-10-CM

## 2021-07-04 DIAGNOSIS — Z9981 Dependence on supplemental oxygen: Secondary | ICD-10-CM

## 2021-07-04 DIAGNOSIS — E1122 Type 2 diabetes mellitus with diabetic chronic kidney disease: Secondary | ICD-10-CM | POA: Diagnosis present

## 2021-07-04 DIAGNOSIS — R7401 Elevation of levels of liver transaminase levels: Secondary | ICD-10-CM | POA: Diagnosis present

## 2021-07-04 DIAGNOSIS — I509 Heart failure, unspecified: Secondary | ICD-10-CM | POA: Diagnosis present

## 2021-07-04 DIAGNOSIS — K5909 Other constipation: Secondary | ICD-10-CM | POA: Diagnosis present

## 2021-07-04 DIAGNOSIS — G2581 Restless legs syndrome: Secondary | ICD-10-CM | POA: Diagnosis present

## 2021-07-04 DIAGNOSIS — Z90710 Acquired absence of both cervix and uterus: Secondary | ICD-10-CM

## 2021-07-04 DIAGNOSIS — F05 Delirium due to known physiological condition: Secondary | ICD-10-CM | POA: Diagnosis not present

## 2021-07-04 DIAGNOSIS — R29703 NIHSS score 3: Secondary | ICD-10-CM | POA: Diagnosis not present

## 2021-07-04 DIAGNOSIS — D631 Anemia in chronic kidney disease: Secondary | ICD-10-CM | POA: Diagnosis present

## 2021-07-04 DIAGNOSIS — I6501 Occlusion and stenosis of right vertebral artery: Secondary | ICD-10-CM | POA: Diagnosis present

## 2021-07-04 DIAGNOSIS — R29701 NIHSS score 1: Secondary | ICD-10-CM | POA: Diagnosis not present

## 2021-07-04 DIAGNOSIS — R471 Dysarthria and anarthria: Secondary | ICD-10-CM | POA: Diagnosis not present

## 2021-07-04 DIAGNOSIS — E874 Mixed disorder of acid-base balance: Secondary | ICD-10-CM | POA: Diagnosis present

## 2021-07-04 DIAGNOSIS — S2242XS Multiple fractures of ribs, left side, sequela: Secondary | ICD-10-CM | POA: Diagnosis not present

## 2021-07-04 LAB — I-STAT ARTERIAL BLOOD GAS, ED
Acid-Base Excess: 3 mmol/L — ABNORMAL HIGH (ref 0.0–2.0)
Bicarbonate: 26.9 mmol/L (ref 20.0–28.0)
Calcium, Ion: 1.27 mmol/L (ref 1.15–1.40)
HCT: 33 % — ABNORMAL LOW (ref 36.0–46.0)
Hemoglobin: 11.2 g/dL — ABNORMAL LOW (ref 12.0–15.0)
O2 Saturation: 99 %
Potassium: 4 mmol/L (ref 3.5–5.1)
Sodium: 143 mmol/L (ref 135–145)
TCO2: 28 mmol/L (ref 22–32)
pCO2 arterial: 37.8 mmHg (ref 32.0–48.0)
pH, Arterial: 7.46 — ABNORMAL HIGH (ref 7.350–7.450)
pO2, Arterial: 128 mmHg — ABNORMAL HIGH (ref 83.0–108.0)

## 2021-07-04 LAB — LACTIC ACID, PLASMA
Lactic Acid, Venous: 4.1 mmol/L (ref 0.5–1.9)
Lactic Acid, Venous: 1.6 mmol/L (ref 0.5–1.9)

## 2021-07-04 LAB — RESPIRATORY PANEL BY PCR

## 2021-07-04 LAB — RESP PANEL BY RT-PCR (FLU A&B, COVID) ARPGX2
Influenza A by PCR: NEGATIVE
Influenza B by PCR: NEGATIVE
SARS Coronavirus 2 by RT PCR: NEGATIVE

## 2021-07-04 LAB — URINALYSIS, MICROSCOPIC (REFLEX): Bacteria, UA: NONE SEEN

## 2021-07-04 LAB — CBC
HCT: 34 % — ABNORMAL LOW (ref 36.0–46.0)
Hemoglobin: 10.6 g/dL — ABNORMAL LOW (ref 12.0–15.0)
MCH: 30.7 pg (ref 26.0–34.0)
MCHC: 31.2 g/dL (ref 30.0–36.0)
MCV: 98.6 fL (ref 80.0–100.0)
Platelets: 126 10*3/uL — ABNORMAL LOW (ref 150–400)
RBC: 3.45 MIL/uL — ABNORMAL LOW (ref 3.87–5.11)
RDW: 17.1 % — ABNORMAL HIGH (ref 11.5–15.5)
WBC: 13.7 10*3/uL — ABNORMAL HIGH (ref 4.0–10.5)
nRBC: 0.2 % (ref 0.0–0.2)

## 2021-07-04 LAB — GLUCOSE, CAPILLARY
Glucose-Capillary: 139 mg/dL — ABNORMAL HIGH (ref 70–99)
Glucose-Capillary: 74 mg/dL (ref 70–99)
Glucose-Capillary: 83 mg/dL (ref 70–99)

## 2021-07-04 LAB — TROPONIN I (HIGH SENSITIVITY)
Troponin I (High Sensitivity): 418 ng/L (ref <18)
Troponin I (High Sensitivity): 330 ng/L (ref <18)

## 2021-07-04 LAB — BRAIN NATRIURETIC PEPTIDE: B Natriuretic Peptide: 1308.5 pg/mL — ABNORMAL HIGH (ref 0.0–100.0)

## 2021-07-04 LAB — BASIC METABOLIC PANEL
Anion gap: 15 (ref 5–15)
BUN: 28 mg/dL — ABNORMAL HIGH (ref 8–23)
CO2: 19 mmol/L — ABNORMAL LOW (ref 22–32)
Calcium: 9 mg/dL (ref 8.9–10.3)
Chloride: 100 mmol/L (ref 98–111)
Creatinine, Ser: 5.84 mg/dL — ABNORMAL HIGH (ref 0.44–1.00)
GFR, Estimated: 7 mL/min — ABNORMAL LOW (ref >60)
Glucose, Bld: 205 mg/dL — ABNORMAL HIGH (ref 70–99)
Potassium: 4.4 mmol/L (ref 3.5–5.1)
Sodium: 134 mmol/L — ABNORMAL LOW (ref 135–145)

## 2021-07-04 LAB — HEPATIC FUNCTION PANEL
ALT: 60 U/L — ABNORMAL HIGH (ref 0–44)
AST: 93 U/L — ABNORMAL HIGH (ref 15–41)
Albumin: 2.7 g/dL — ABNORMAL LOW (ref 3.5–5.0)
Alkaline Phosphatase: 71 U/L (ref 38–126)
Bilirubin, Direct: 0.2 mg/dL (ref 0.0–0.2)
Indirect Bilirubin: 1 mg/dL — ABNORMAL HIGH (ref 0.3–0.9)
Total Bilirubin: 1.2 mg/dL (ref 0.3–1.2)
Total Protein: 5.2 g/dL — ABNORMAL LOW (ref 6.5–8.1)

## 2021-07-04 LAB — I-STAT CHEM 8, ED
BUN: 36 mg/dL — ABNORMAL HIGH (ref 8–23)
Calcium, Ion: 1.28 mmol/L (ref 1.15–1.40)
Chloride: 107 mmol/L (ref 98–111)
Creatinine, Ser: 6 mg/dL — ABNORMAL HIGH (ref 0.44–1.00)
Glucose, Bld: 228 mg/dL — ABNORMAL HIGH (ref 70–99)
HCT: 34 % — ABNORMAL LOW (ref 36.0–46.0)
Hemoglobin: 11.6 g/dL — ABNORMAL LOW (ref 12.0–15.0)
Potassium: 4.3 mmol/L (ref 3.5–5.1)
Sodium: 143 mmol/L (ref 135–145)
TCO2: 28 mmol/L (ref 22–32)

## 2021-07-04 LAB — URINALYSIS, ROUTINE W REFLEX MICROSCOPIC
Bilirubin Urine: NEGATIVE
Glucose, UA: >=500 mg/dL — AB
Ketones, ur: NEGATIVE mg/dL
Leukocytes,Ua: NEGATIVE
Nitrite: NEGATIVE
Protein, ur: 100 mg/dL — AB
Specific Gravity, Urine: 1.015 (ref 1.005–1.030)
pH: 8.5 — ABNORMAL HIGH (ref 5.0–8.0)

## 2021-07-04 LAB — CBG MONITORING, ED: Glucose-Capillary: 218 mg/dL — ABNORMAL HIGH (ref 70–99)

## 2021-07-04 LAB — ECHOCARDIOGRAM COMPLETE
AR max vel: 1.06 cm2
AV Area VTI: 1.07 cm2
AV Area mean vel: 1.03 cm2
AV Mean grad: 9 mmHg
AV Peak grad: 17.5 mmHg
Ao pk vel: 2.09 m/s
Area-P 1/2: 3.21 cm2
Height: 69 in
S' Lateral: 2.7 cm
Single Plane A4C EF: 53 %
Weight: 2821.89 oz

## 2021-07-04 LAB — MAGNESIUM: Magnesium: 2 mg/dL (ref 1.7–2.4)

## 2021-07-04 LAB — HEPATITIS B CORE ANTIBODY, TOTAL: Hep B Core Total Ab: NONREACTIVE

## 2021-07-04 LAB — HEPATITIS B SURFACE ANTIGEN: Hepatitis B Surface Ag: NONREACTIVE

## 2021-07-04 LAB — HEMOGLOBIN A1C
Hgb A1c MFr Bld: 6.1 % — ABNORMAL HIGH (ref 4.8–5.6)
Mean Plasma Glucose: 128.37 mg/dL

## 2021-07-04 LAB — AMMONIA: Ammonia: 68 umol/L — ABNORMAL HIGH (ref 9–35)

## 2021-07-04 LAB — MRSA NEXT GEN BY PCR, NASAL: MRSA by PCR Next Gen: NOT DETECTED

## 2021-07-04 LAB — HEPATITIS B SURFACE ANTIBODY,QUALITATIVE: Hep B S Ab: REACTIVE — AB

## 2021-07-04 IMAGING — DX DG ABDOMEN 1V
1 series · 1 of 1 positions shown · non-contrast
Comparison: CT chest [DATE].

CLINICAL DATA: OG tube.

EXAM:
ABDOMEN - 1 VIEW

[abdomen kub]
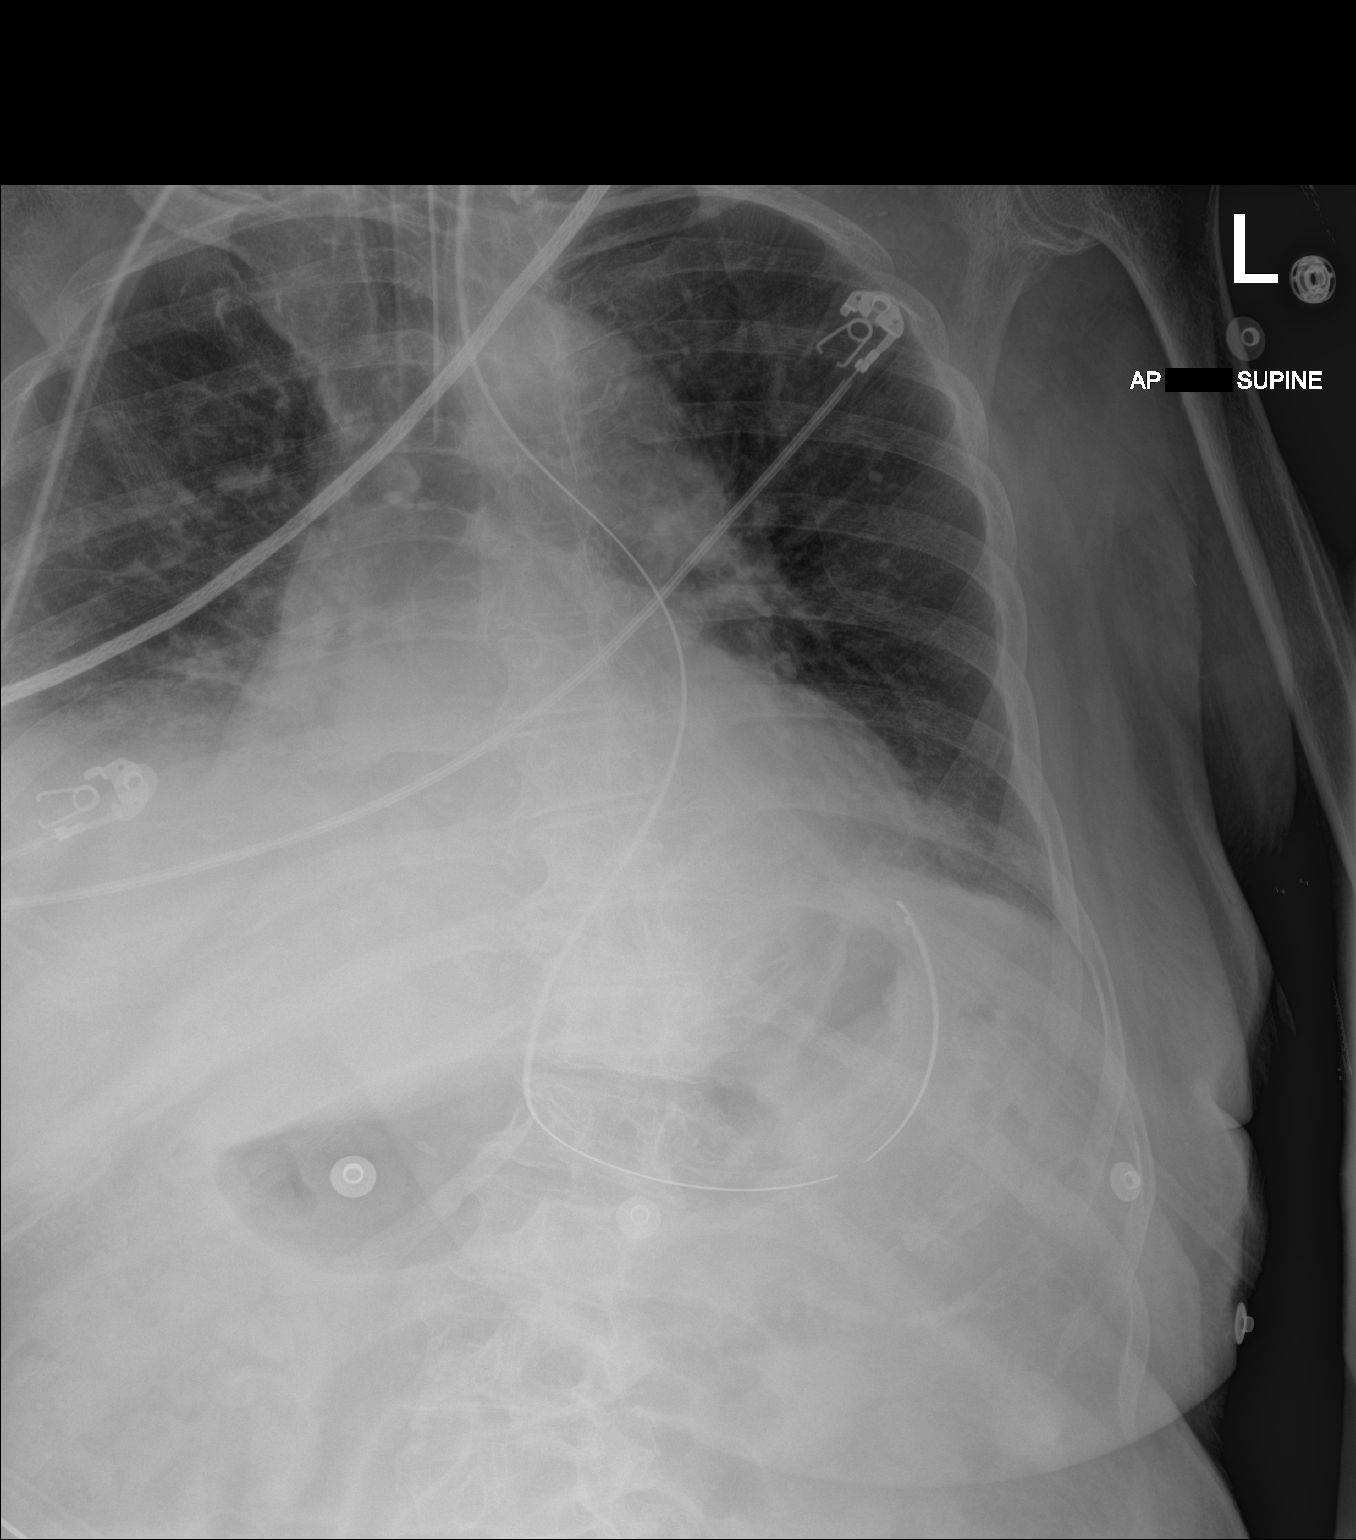

[1 of 1 positions shown; findings below may reference images not displayed]

FINDINGS: Orogastric tube tip is at the gastric fundus. No dilated bowel loops
are seen. Small right pleural effusion and right basilar infiltrates
persist. Endotracheal tube tip is 2 cm above the carina.
Cardiomediastinal silhouette within normal limits.
IMPRESSION: 1. Orogastric tube tip in the gastric fundus.

## 2021-07-04 IMAGING — CT CT HEAD W/O CM
4 of 5 series · 15 of 47 positions shown, 17 images · non-contrast
Comparison: None.

CLINICAL DATA: Altered mental status

EXAM:
CT HEAD WITHOUT CONTRAST
TECHNIQUE: Contiguous axial images were obtained from the base of the skull
through the vertex without intravenous contrast.

[Series 3: head without · axial · non-contrast · 0.42mm/px · z∈[-146,-46]mm · 4 of 34 slices shown]
[im 7/34  brain]
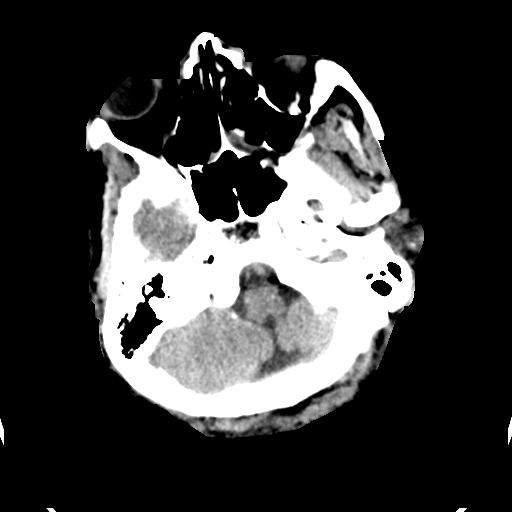
[im 14/34  brain]
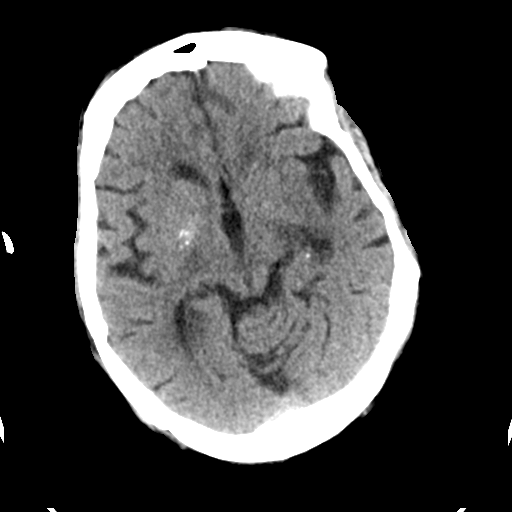
[im 20/34  brain]
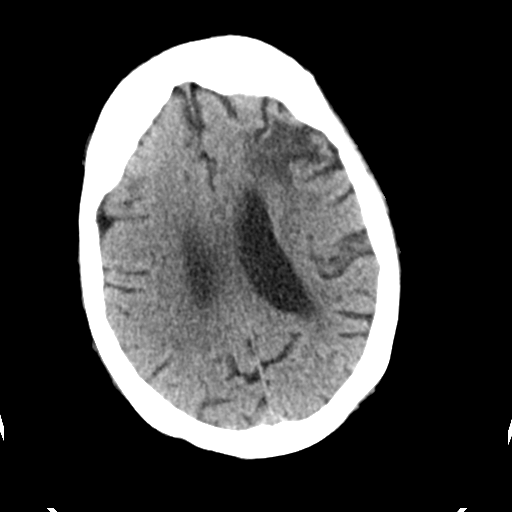
[im 27/34  brain]
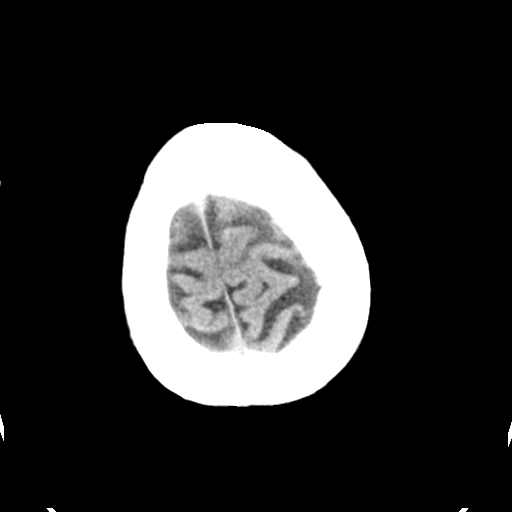

[Series 5: head without cor · coronal · non-contrast · 0.33mm/px · 3 of 67 slices shown]
[im 23/67  brain]
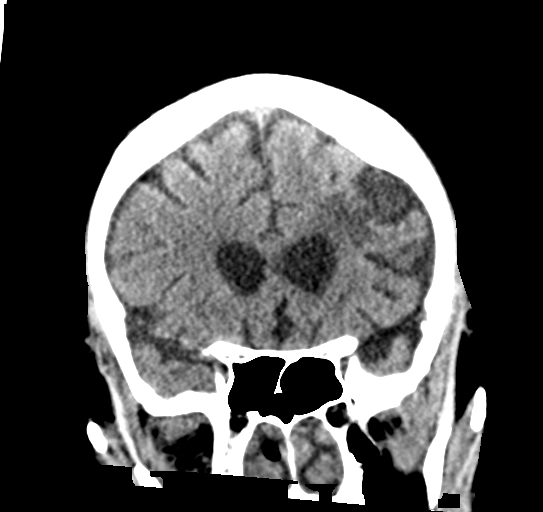
[im 30/67  brain]
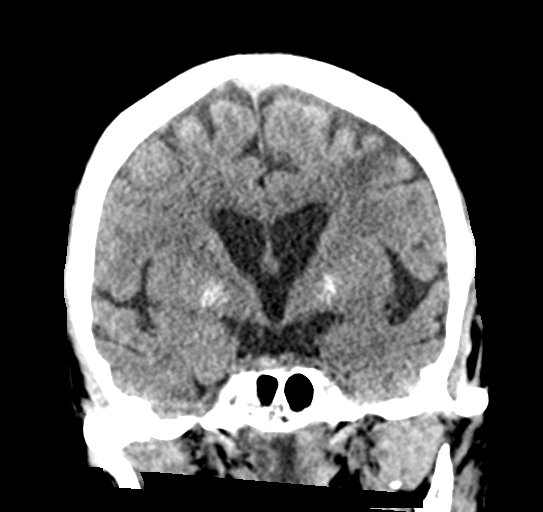
[im 37/67  brain]
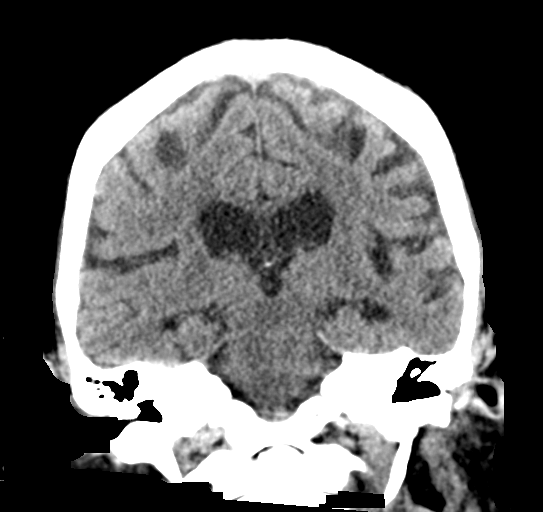

[Series 6: head without sag · sagittal · non-contrast · 0.33mm/px · 3 of 54 slices shown]
[im 19/54  brain]
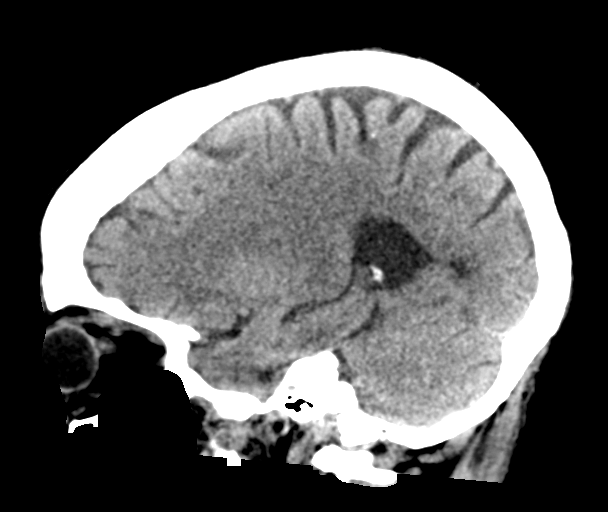
[im 27/54  brain]
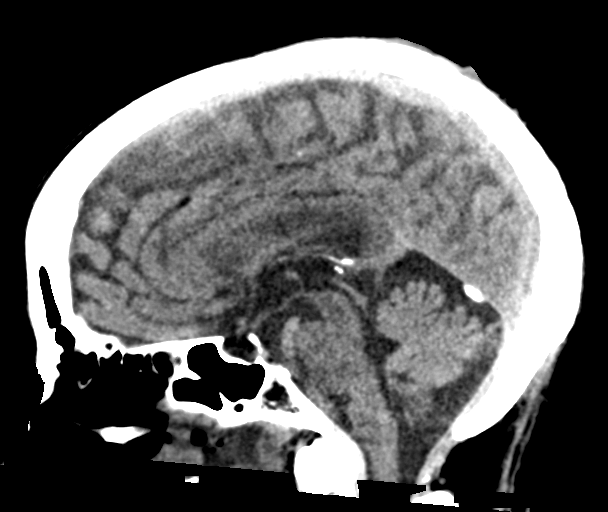
[im 36/54  brain]
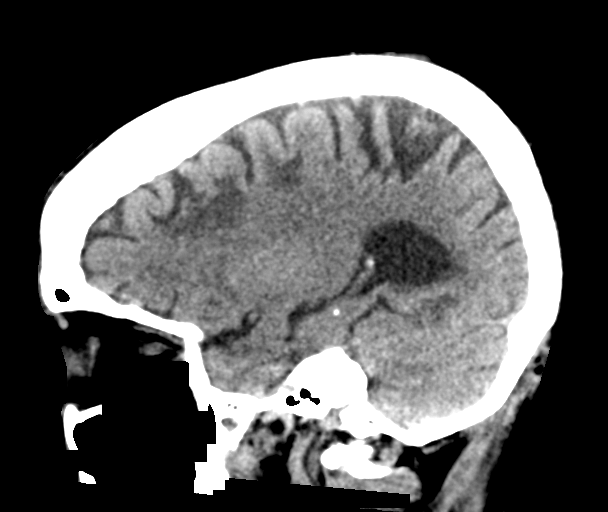

[Series 7: head without ax · axial · non-contrast · 0.33mm/px · z∈[-173,-69]mm · 5 of 33 slices shown, 7 images]
[im 6/33  brain]
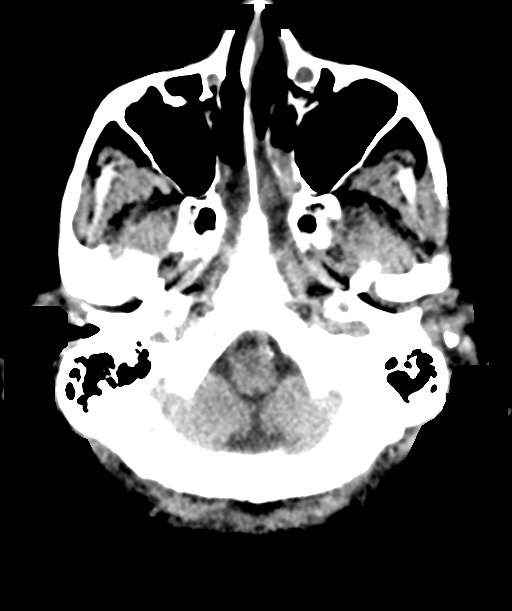
[im 6/33  bone]
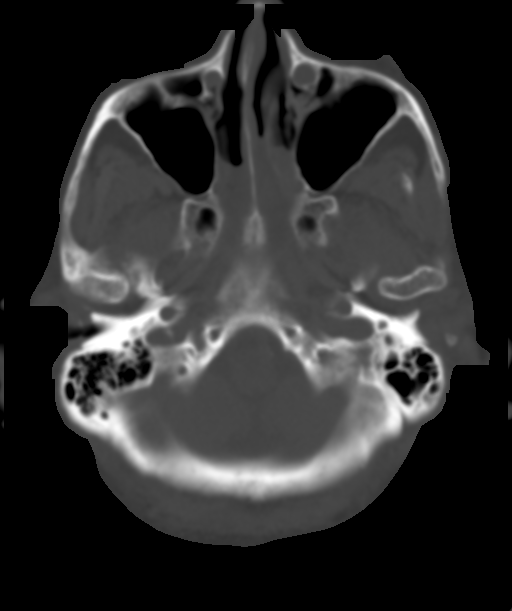
[im 11/33  brain]
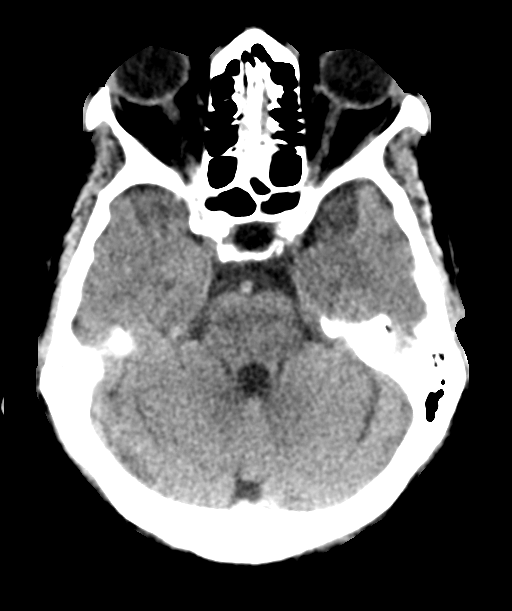
[im 17/33  brain]
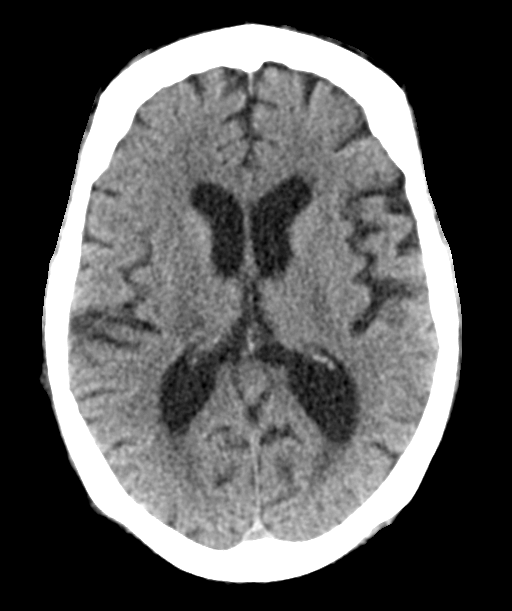
[im 22/33  brain]
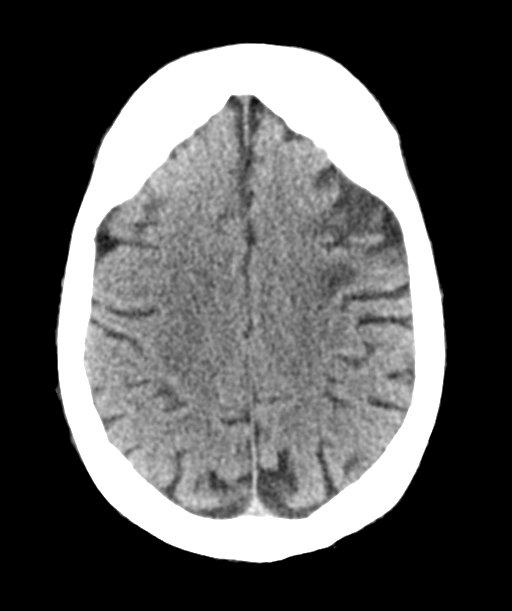
[im 27/33  brain]
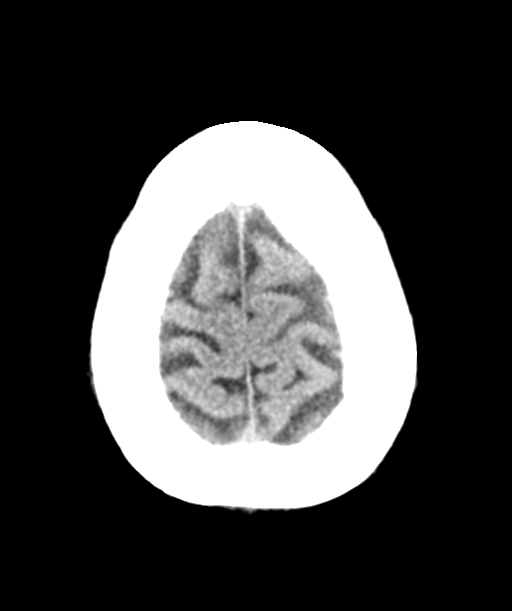
[im 27/33  bone]
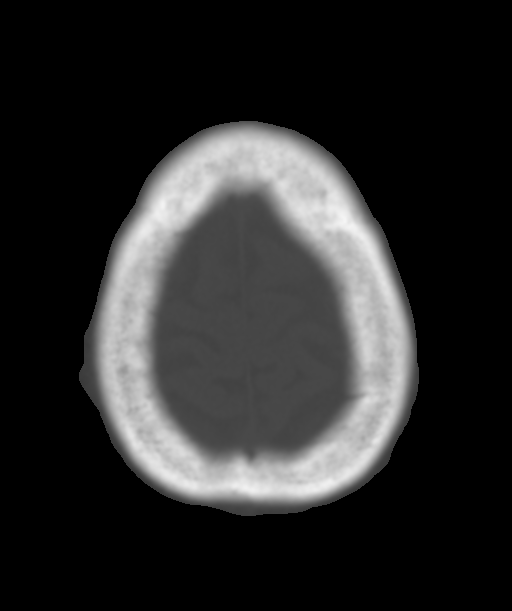

[15 of 47 positions shown; findings below may reference images not displayed]

FINDINGS: Brain: There are no signs of bleeding within the cranium. There is
moderate sized area of decreased density in the left frontal lobe
suggesting old infarct. Cortical sulci are prominent, more so in the
left temporal region. Calcifications are seen in the basal ganglia
on both sides.

Vascular: There are scattered arterial calcifications.

Skull: Unremarkable

Sinuses/Orbits: There are no air-fluid levels in the paranasal
sinuses. There is mild mucosal thickening in the ethmoid and
sphenoid sinuses. Secretions are seen in the lumen of nasopharynx.

Other: There is increased amount of CSF insula suggesting partial
empty sella.
IMPRESSION: No acute intracranial findings are seen in noncontrast CT brain.
Encephalomalacia in the left frontal lobe suggests old infarct.
Atrophy.

## 2021-07-04 IMAGING — CT CT ANGIO CHEST
2 of 7 series · 17 of 46 positions shown · IV contrast (omnipaque)
Comparison: Same-day chest radiograph, CT chest [DATE]

CLINICAL DATA: CPR with ROSC obtained, suspect PE

EXAM:
CT ANGIOGRAPHY CHEST WITH CONTRAST
TECHNIQUE: Multidetector CT imaging of the chest was performed using the
standard protocol during bolus administration of intravenous
contrast. Multiplanar CT image reconstructions and MIPs were
obtained to evaluate the vascular anatomy.
CONTRAST:  100mL OMNIPAQUE IOHEXOL 350 MG/ML SOLN

[Series 7: thins · axial · 0.85mm/px · z∈[+1193,+1431]mm · 14 of 266 slices shown]
[im 14/266  lung]
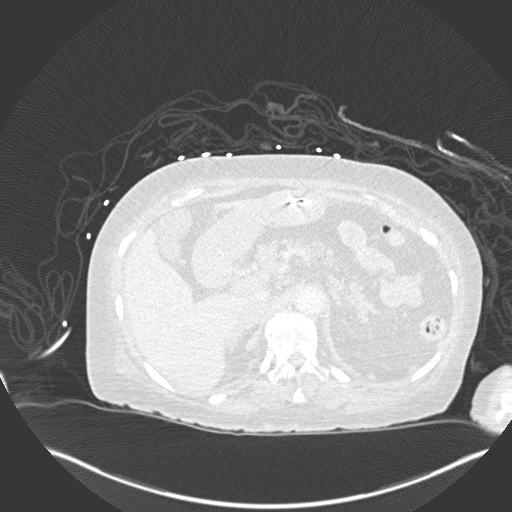
[im 28/266  soft-tissue]
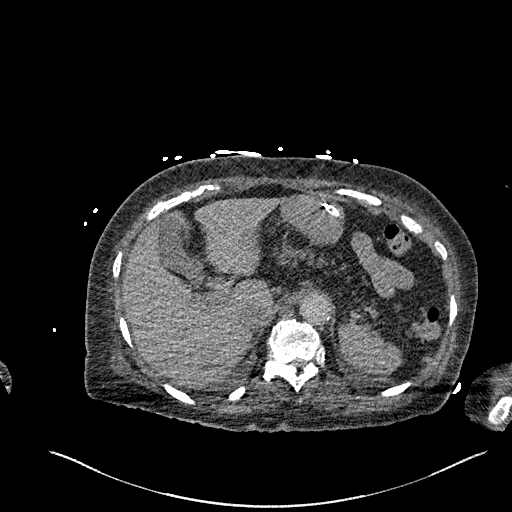
[im 56/266  lung]
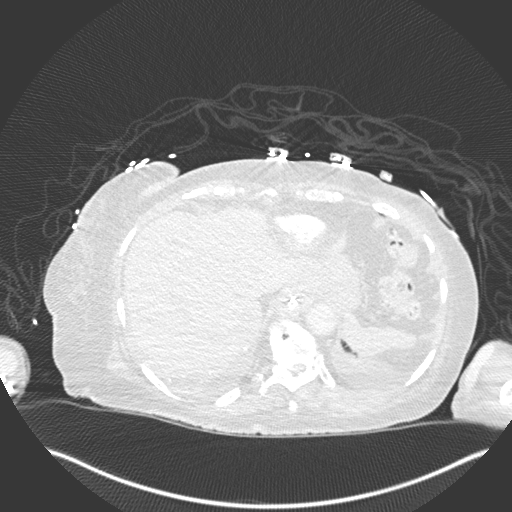
[im 70/266  soft-tissue]
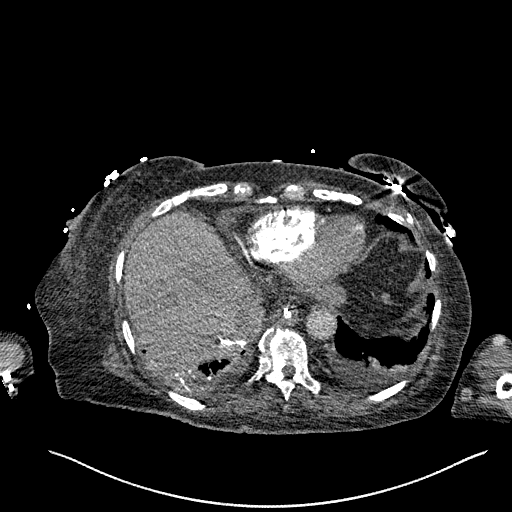
[im 84/266  lung]
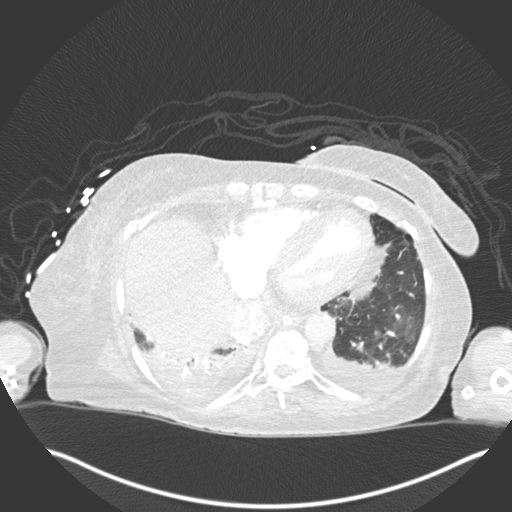
[im 112/266  soft-tissue]
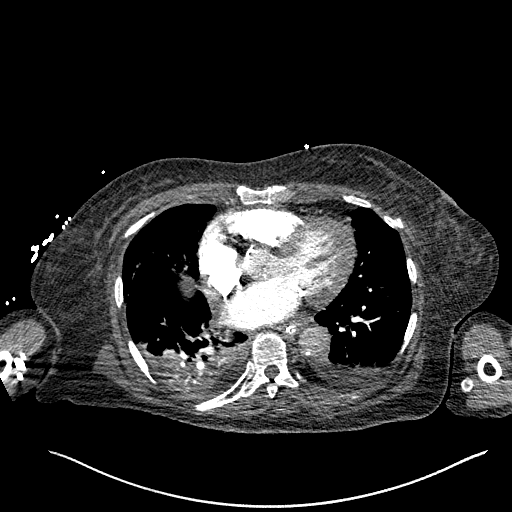
[im 126/266  lung]
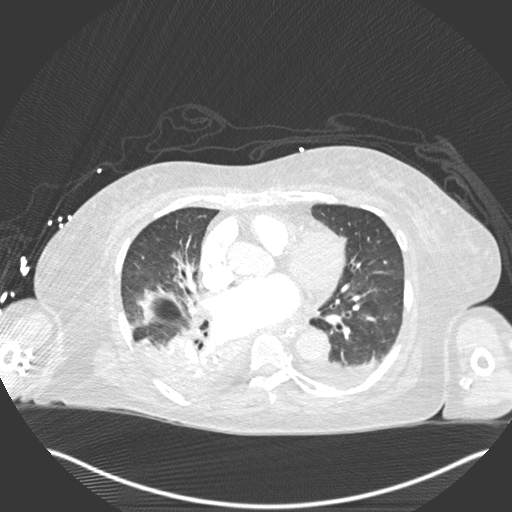
[im 140/266  soft-tissue]
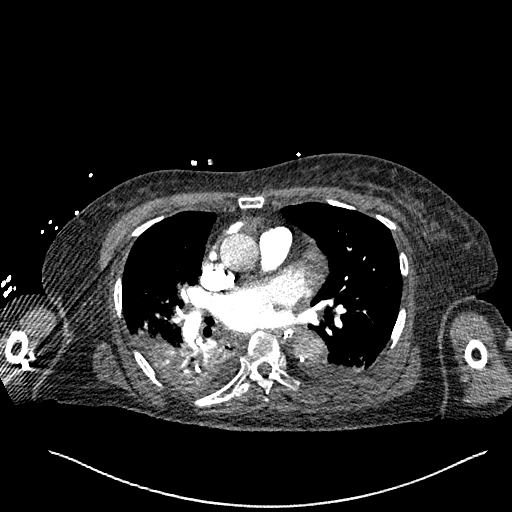
[im 154/266  lung]
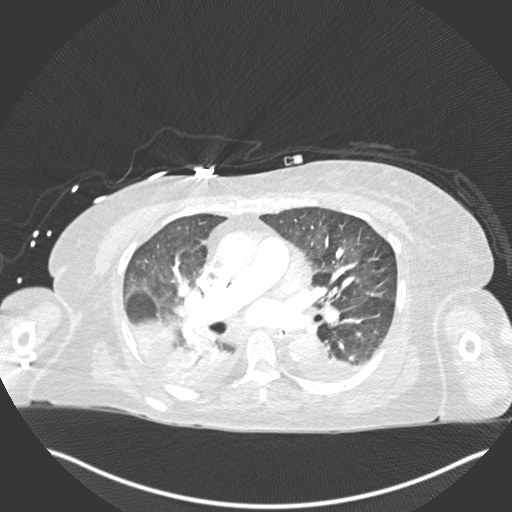
[im 182/266  soft-tissue]
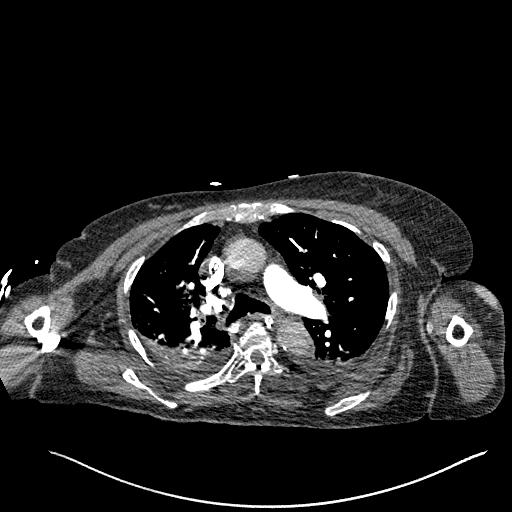
[im 196/266  lung]
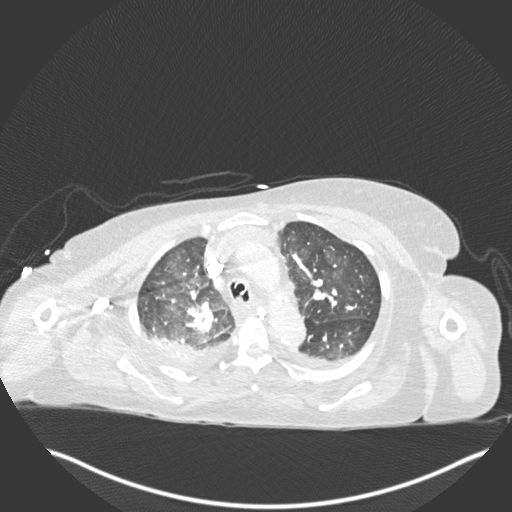
[im 210/266  soft-tissue]
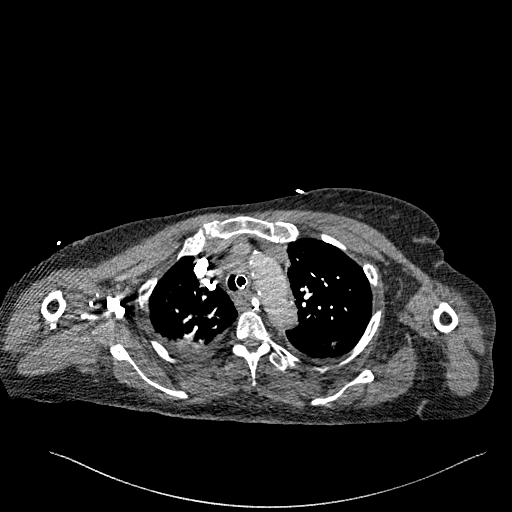
[im 238/266  lung]
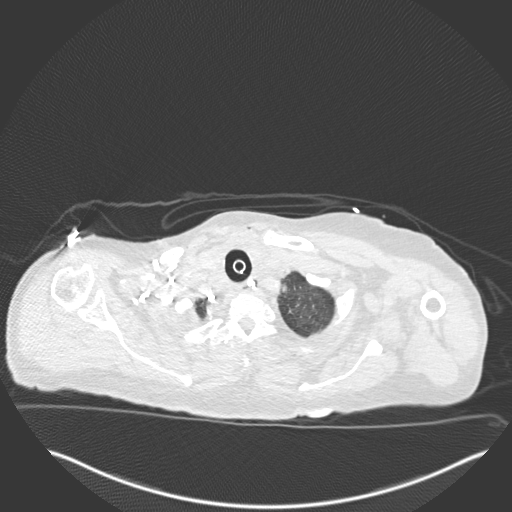
[im 252/266  soft-tissue]
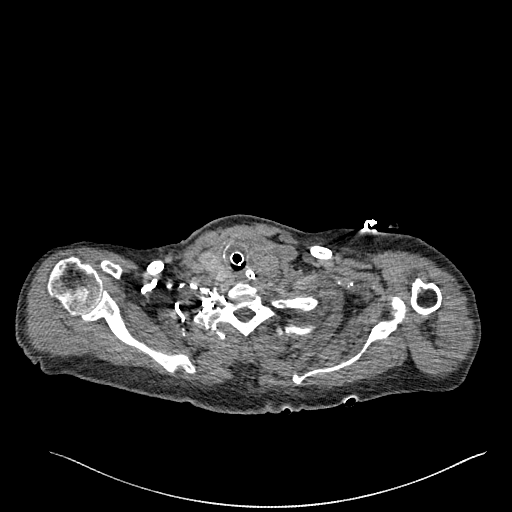

[Series 9: coronal mpr · coronal · 0.56mm/px · 3 of 130 slices shown]
[im 33/130  soft-tissue]
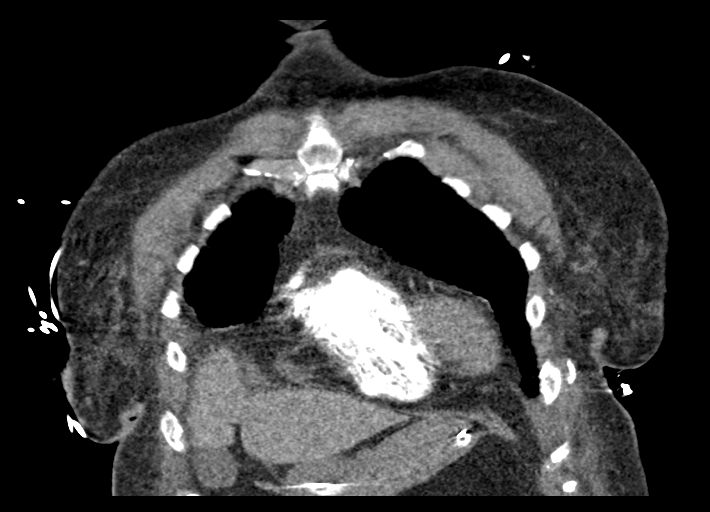
[im 65/130  soft-tissue]
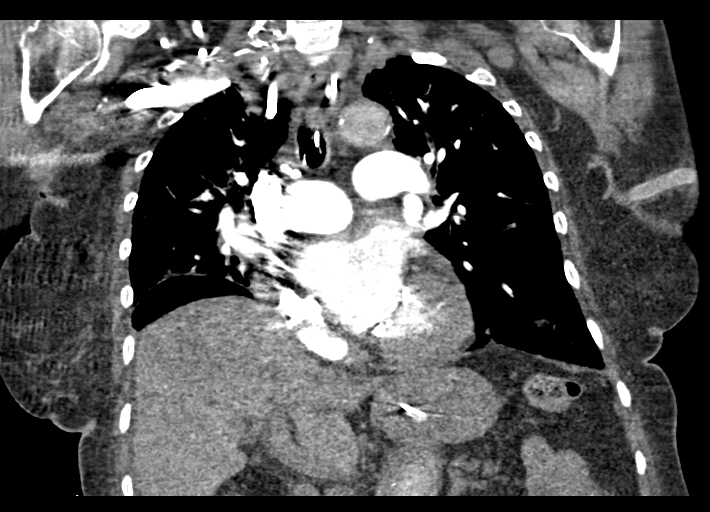
[im 97/130  soft-tissue]
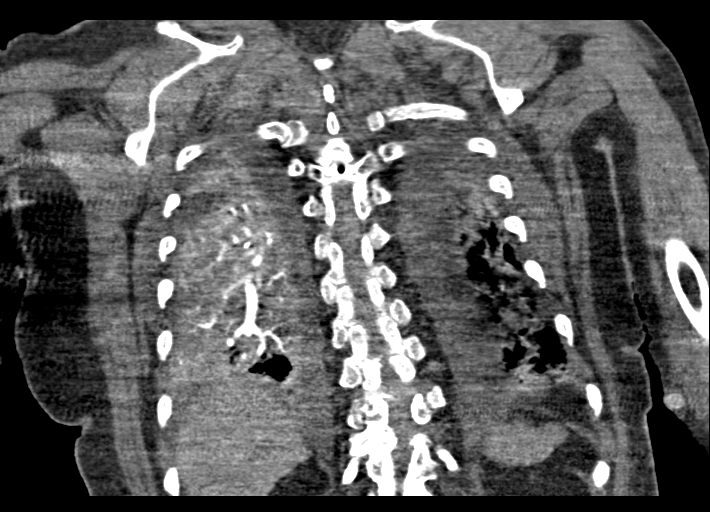

[17 of 46 positions shown; findings below may reference images not displayed]

FINDINGS: Cardiovascular: There is adequate opacification of the pulmonary
arteries to the subsegmental level. There is no evidence of
pulmonary embolism. The heart is enlarged. There are mitral annular
calcifications, mild aortic valve calcifications, and scattered
coronary artery calcifications. There is mild calcified
atherosclerotic plaque in the nonaneurysmal thoracic aorta. There is
reflux of contrast into the IVC suggesting right heart dysfunction.

Mediastinum/Nodes: The thyroid is unremarkable. The esophagus is
grossly unremarkable. There is no mediastinal, hilar, or axillary
lymphadenopathy.

Lungs/Pleura: The endotracheal tube tip is approximally 6 mm from
the carina and approaches the right main bronchus. The trachea and
central airways are patent.

There is consolidation in the dependent lower lobes, right
significantly worse than left. There are additional patchy opacities
and interlobular septal thickening in the lung apices and left lung
base. There are small bilateral pleural effusions. There is no
pneumothorax.

Upper Abdomen: The enteric catheter tip is in the distal stomach. A
right adrenal adenoma is unchanged there are no acute findings in
the upper abdomen.

Musculoskeletal: There are acute fractures of the right fourth and
sixth through eighth ribs and left sixth through ninth ribs.

Review of the MIP images confirms the above findings.
IMPRESSION: 1. No evidence of pulmonary embolism.
2. Findings above suggesting heart failure including cardiomegaly,
patchy ground-glass opacities and interlobular septal thickening
likely reflecting mild to moderate pulmonary interstitial edema, and
small bilateral pleural effusions. Additionally, reflux of contrast
into the IVC suggests right heart dysfunction.
3. Consolidations in the bilateral lower lobes may reflect
atelectasis, though pneumonia can not be excluded.
4. Multiple bilateral rib fractures likely related to CPR.
5. Endotracheal tube tip approaching the right main bronchus.
Recommend retraction by approximately 1-2 cm.

## 2021-07-04 IMAGING — DX DG CHEST 1V PORT
1 series · 1 of 1 positions shown · non-contrast
Comparison: [DATE]

CLINICAL DATA: Code, post intubation

EXAM:
PORTABLE CHEST 1 VIEW

[chest ap]
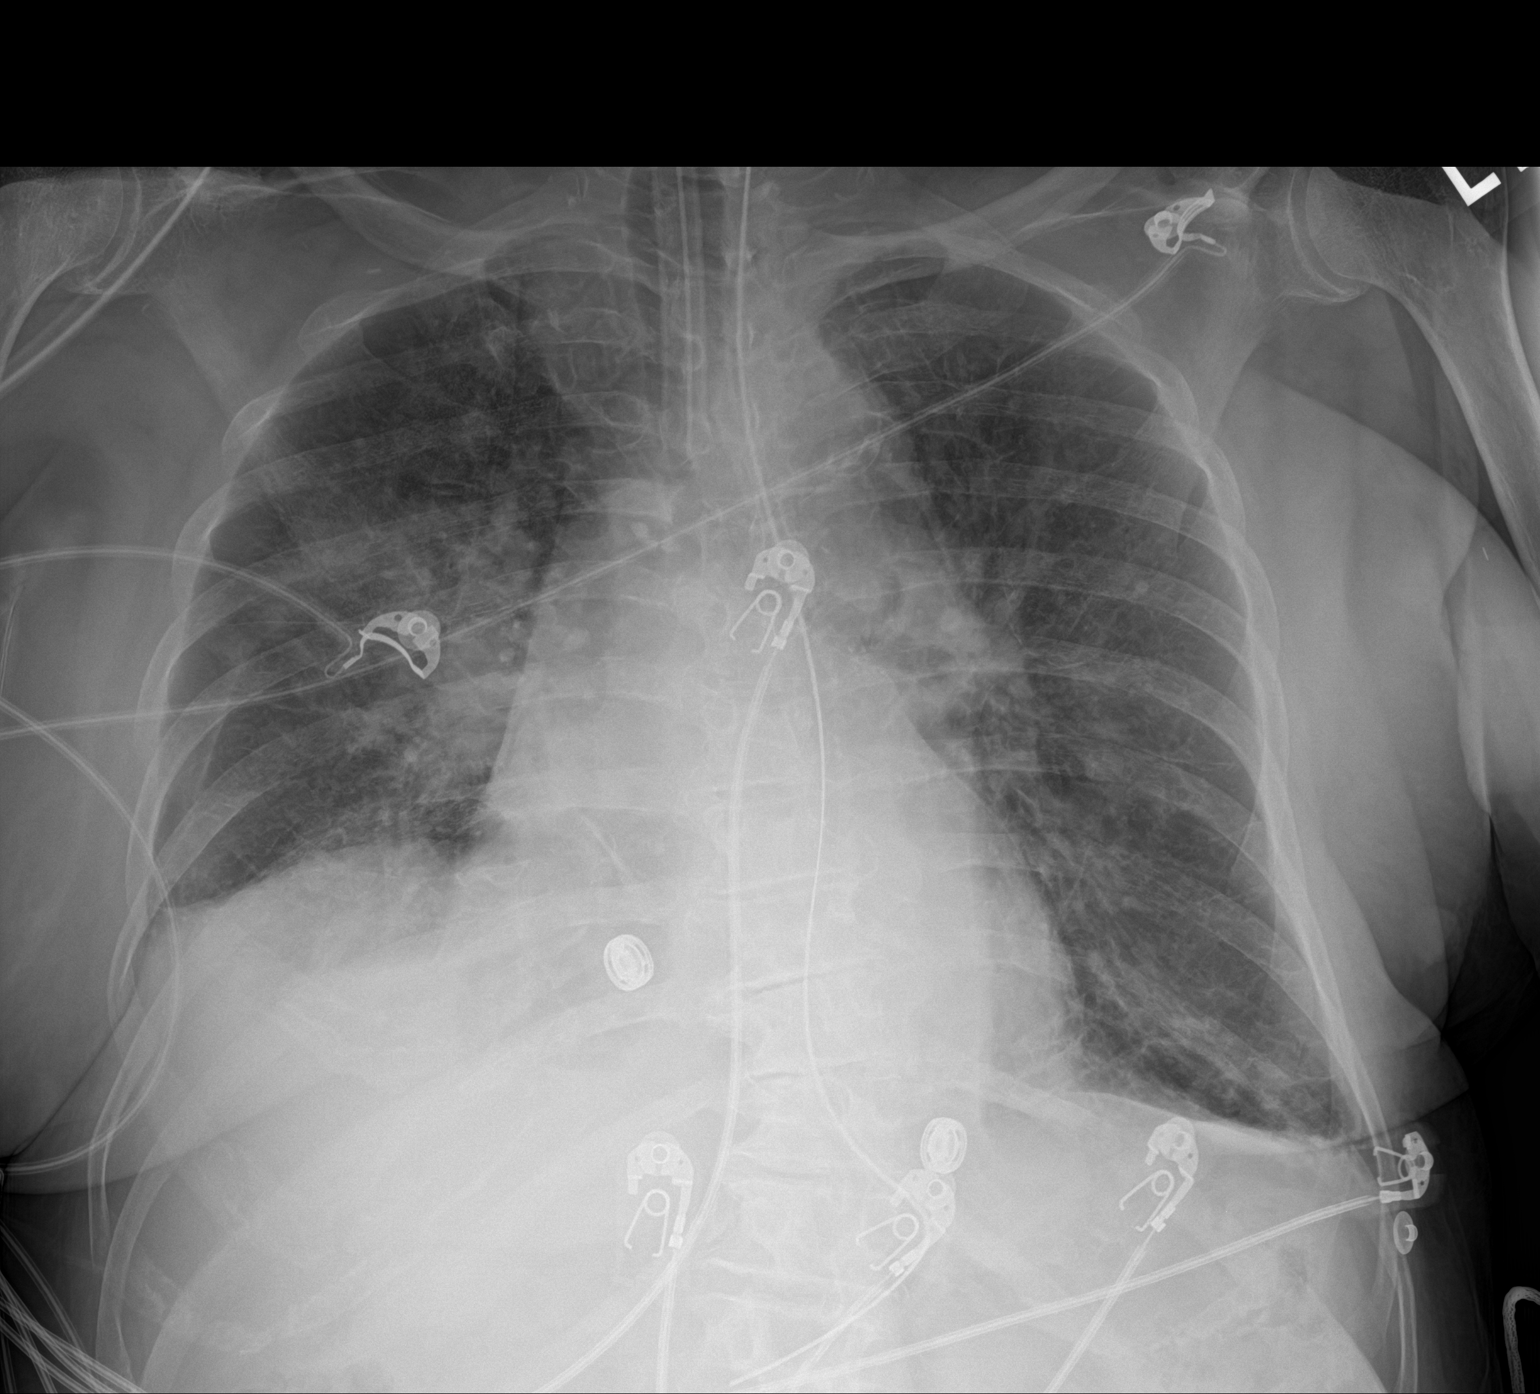

[1 of 1 positions shown; findings below may reference images not displayed]

FINDINGS: Endotracheal tube is 3 cm above the carina. NG tube tip is near the
GE junction. No confluent opacity on the left. Patchy airspace
disease in the right perihilar region and right lower lobe with
volume loss and elevation of the right hemidiaphragm. No visible
effusions or pneumothorax. No acute bony abnormality.
IMPRESSION: Airspace disease in the right perihilar region and right lower lobe
concerning for pneumonia.

Volume loss on the right.

## 2021-07-04 MED ORDER — LACTATED RINGERS IV SOLN: INTRAVENOUS | Status: DC

## 2021-07-04 MED ORDER — PROPOFOL 1000 MG/100ML IV EMUL
0.0000 ug/kg/min | INTRAVENOUS | Status: DC
  Administered 2021-07-04: 25 ug/kg/min via INTRAVENOUS

## 2021-07-04 MED ORDER — ROCURONIUM BROMIDE 50 MG/5ML IV SOLN
INTRAVENOUS | Status: DC | PRN
  Administered 2021-07-04: 80 mg via INTRAVENOUS

## 2021-07-04 MED ORDER — FENTANYL CITRATE PF 50 MCG/ML IJ SOSY
25.0000 ug | PREFILLED_SYRINGE | INTRAMUSCULAR | Status: DC | PRN
  Administered 2021-07-04: 100 ug via INTRAVENOUS
  Administered 2021-07-04 (×2): 50 ug via INTRAVENOUS
  Filled 2021-07-04 (×2): qty 1
  Filled 2021-07-04: qty 2

## 2021-07-04 MED ORDER — IOHEXOL 350 MG/ML SOLN
100.0000 mL | Freq: Once | INTRAVENOUS | Status: AC | PRN
  Administered 2021-07-04: 100 mL via INTRAVENOUS

## 2021-07-04 MED ORDER — FENTANYL CITRATE (PF) 100 MCG/2ML IJ SOLN
25.0000 ug | INTRAMUSCULAR | Status: DC | PRN
  Administered 2021-07-05: 100 ug via INTRAVENOUS
  Administered 2021-07-05: 50 ug via INTRAVENOUS
  Administered 2021-07-05: 25 ug via INTRAVENOUS
  Filled 2021-07-04 (×3): qty 2

## 2021-07-04 MED ORDER — ONDANSETRON HCL 4 MG/2ML IJ SOLN: 4.0000 mg | Freq: Four times a day (QID) | INTRAMUSCULAR | Status: DC | PRN

## 2021-07-04 MED ORDER — PANTOPRAZOLE SODIUM 40 MG IV SOLR
40.0000 mg | Freq: Every day | INTRAVENOUS | Status: DC
  Administered 2021-07-04 – 2021-07-05 (×2): 40 mg via INTRAVENOUS
  Filled 2021-07-04 (×2): qty 40

## 2021-07-04 MED ORDER — ALBUMIN HUMAN 25 % IV SOLN
25.0000 g | Freq: Once | INTRAVENOUS | Status: AC
  Administered 2021-07-04: 25 g via INTRAVENOUS
  Filled 2021-07-04: qty 100

## 2021-07-04 MED ORDER — HEPARIN SODIUM (PORCINE) 5000 UNIT/ML IJ SOLN
5000.0000 [IU] | Freq: Three times a day (TID) | INTRAMUSCULAR | Status: DC
  Administered 2021-07-04 – 2021-07-16 (×35): 5000 [IU] via SUBCUTANEOUS
  Filled 2021-07-04 (×34): qty 1

## 2021-07-04 MED ORDER — IOHEXOL 350 MG/ML SOLN: 100.0000 mL | Freq: Once | INTRAVENOUS | Status: DC

## 2021-07-04 MED ORDER — CHLORHEXIDINE GLUCONATE CLOTH 2 % EX PADS
6.0000 | MEDICATED_PAD | Freq: Every day | CUTANEOUS | Status: DC
  Administered 2021-07-04 – 2021-07-16 (×11): 6 via TOPICAL

## 2021-07-04 MED ORDER — SODIUM CHLORIDE 0.9 % IV SOLN
1.0000 g | Freq: Once | INTRAVENOUS | Status: AC
  Administered 2021-07-04: 1 g via INTRAVENOUS
  Filled 2021-07-04: qty 10

## 2021-07-04 MED ORDER — POLYETHYLENE GLYCOL 3350 17 G PO PACK: 17.0000 g | PACK | Freq: Every day | ORAL | Status: DC | PRN

## 2021-07-04 MED ORDER — DOCUSATE SODIUM 50 MG/5ML PO LIQD
100.0000 mg | Freq: Two times a day (BID) | ORAL | Status: DC | PRN
  Administered 2021-07-06: 100 mg

## 2021-07-04 MED ORDER — FENTANYL CITRATE PF 50 MCG/ML IJ SOSY: 25.0000 ug | PREFILLED_SYRINGE | INTRAMUSCULAR | Status: DC | PRN

## 2021-07-04 MED ORDER — POLYETHYLENE GLYCOL 3350 17 G PO PACK
17.0000 g | PACK | Freq: Every day | ORAL | Status: DC
  Administered 2021-07-05 – 2021-07-07 (×3): 17 g
  Filled 2021-07-04 (×3): qty 1

## 2021-07-04 MED ORDER — ACETAMINOPHEN 325 MG PO TABS
650.0000 mg | ORAL_TABLET | ORAL | Status: DC | PRN
  Administered 2021-07-05: 650 mg
  Filled 2021-07-04: qty 2

## 2021-07-04 MED ORDER — DOCUSATE SODIUM 50 MG/5ML PO LIQD
100.0000 mg | Freq: Two times a day (BID) | ORAL | Status: DC
  Administered 2021-07-04 – 2021-07-06 (×5): 100 mg
  Filled 2021-07-04 (×6): qty 10

## 2021-07-04 MED ORDER — CHLORHEXIDINE GLUCONATE 0.12% ORAL RINSE (MEDLINE KIT)
15.0000 mL | Freq: Two times a day (BID) | OROMUCOSAL | Status: DC
  Administered 2021-07-04 – 2021-07-05 (×3): 15 mL via OROMUCOSAL

## 2021-07-04 MED ORDER — SODIUM CHLORIDE 0.9 % IV SOLN
500.0000 mg | INTRAVENOUS | Status: AC
  Administered 2021-07-04 – 2021-07-08 (×5): 500 mg via INTRAVENOUS
  Filled 2021-07-04 (×5): qty 5

## 2021-07-04 MED ORDER — DEXMEDETOMIDINE HCL IN NACL 400 MCG/100ML IV SOLN
0.4000 ug/kg/h | INTRAVENOUS | Status: DC
  Administered 2021-07-04: 0.4 ug/kg/h via INTRAVENOUS
  Administered 2021-07-04: 0.6 ug/kg/h via INTRAVENOUS
  Administered 2021-07-05: 0.7 ug/kg/h via INTRAVENOUS
  Filled 2021-07-04 (×2): qty 100

## 2021-07-04 MED ORDER — INSULIN ASPART 100 UNIT/ML IJ SOLN
0.0000 [IU] | INTRAMUSCULAR | Status: DC
  Administered 2021-07-04 – 2021-07-06 (×5): 1 [IU] via SUBCUTANEOUS
  Administered 2021-07-07: 2 [IU] via SUBCUTANEOUS
  Administered 2021-07-07 (×2): 1 [IU] via SUBCUTANEOUS
  Administered 2021-07-08: 2 [IU] via SUBCUTANEOUS
  Administered 2021-07-08: 1 [IU] via SUBCUTANEOUS
  Administered 2021-07-09: 2 [IU] via SUBCUTANEOUS

## 2021-07-04 MED ORDER — SODIUM CHLORIDE 0.9 % IV BOLUS
1000.0000 mL | Freq: Once | INTRAVENOUS | Status: AC
  Administered 2021-07-04: 1000 mL via INTRAVENOUS

## 2021-07-04 MED ORDER — ETOMIDATE 2 MG/ML IV SOLN
INTRAVENOUS | Status: DC | PRN
  Administered 2021-07-04: 20 mg via INTRAVENOUS

## 2021-07-04 MED ORDER — SODIUM CHLORIDE 0.9 % IV SOLN: 2.0000 g | Freq: Once | INTRAVENOUS | Status: DC

## 2021-07-04 MED ORDER — ORAL CARE MOUTH RINSE
15.0000 mL | OROMUCOSAL | Status: DC
  Administered 2021-07-04 – 2021-07-05 (×6): 15 mL via OROMUCOSAL

## 2021-07-04 MED ORDER — CLEVIDIPINE BUTYRATE 0.5 MG/ML IV EMUL
0.0000 mg/h | INTRAVENOUS | Status: DC
  Administered 2021-07-04: 1 mg/h via INTRAVENOUS
  Administered 2021-07-05: 3 mg/h via INTRAVENOUS
  Administered 2021-07-05: 4 mg/h via INTRAVENOUS
  Administered 2021-07-05: 8 mg/h via INTRAVENOUS
  Administered 2021-07-06: 9 mg/h via INTRAVENOUS
  Filled 2021-07-04 (×2): qty 100
  Filled 2021-07-04: qty 50
  Filled 2021-07-04: qty 100
  Filled 2021-07-04: qty 50
  Filled 2021-07-04: qty 100

## 2021-07-04 NOTE — Consult Note (Addendum)
[facility] Renal Consultation Note  Indication for Consultation:  Management of ESRD/hemodialysis; anemia, hypertension/volume and secondary hyperparathyroidism  HPI: Stacey Dorsey is a 87 y.o. female with ESRD secondary to DM(HD started 12/20/2019 [place] transferred to [facility] June 2021 on  HD TTS, has not missed HD this week), HO COPD, hypertension, GERD, gout. Now admitted to [facility] service with out of hospital PEA arrest proceeding with shortness of breath, acute respiratory failure with hypoxia acute encephalopathy metabolic.  Patient seen in ER intubated ,daughter present gives history of patient in her usual state of health, got up with getting ready for HD , cooked breakfast, and then alerted her at 10 AM call EMS 2/2.SOB.  EMS arrived patient was altered subsequently sustained cardiac arrest --PEA ,"received 15-minute CPR, 1 epi, 1 g calcium, 50 mg bicarb as well as 100 MCG of Fent and 5 mg Versed.  K ING placed which was exchanged for ETT in ED.  So far labs available in ER Na  148, K4.0.  Hgb 11.2, Troponin I= 330, BNP 1308 Chest x-ray airspace disease in right perihilar disease and right lower lobe concerning for pneumonia, volume loss on the right Echo being done in ER  When I arrived patient intubated in ER, eyes open able to follow commands moves all extremities.    No past medical history on file.     No family history on file.    has no history on file for tobacco use, alcohol use, and drug use.   Allergies  Allergen Reactions   Penicillins    Shellfish Allergy     Prior to Admission medications   Medication Sig Start Date End Date Taking? Authorizing Provider  AURYXIA 1 GM 210 MG(Fe) tablet Take 1 tablet by mouth 3 (three) times daily. 07/04/21   [provider]  carvedilol (COREG) 12.5 MG tablet Take 12.5 mg by mouth 2 (two) times daily. 05/01/21   [provider]  lidocaine-prilocaine (EMLA) cream Apply 1  application topically 3 (three) times a week. 04/05/21   [provider]  NIFEdipine (PROCARDIA-XL/NIFEDICAL-XL) 30 MG 24 hr tablet Take 30 mg by mouth at bedtime. 05/01/21   [provider]  omeprazole (PRILOSEC) 20 MG capsule Take 20 mg by mouth 2 (two) times daily. 05/08/21   [provider]  rOPINIRole (REQUIP) 0.5 MG tablet Take 0.5 mg by mouth at bedtime. 05/03/21   [provider]  simvastatin (ZOCOR) 20 MG tablet Take 20 mg by mouth at bedtime. 05/03/21   [provider]      Results for orders placed or performed during the hospital encounter of 07/04/21 (from the past 48 hour(s))  Resp Panel by RT-PCR (Flu A&B, Covid) Nasopharyngeal Swab     Status: None   Collection Time: 07/04/21  9:41 AM   Specimen: Nasopharyngeal Swab; Nasopharyngeal(NP) swabs in vial transport medium  Result Value Ref Range   SARS Coronavirus 2 by RT PCR NEGATIVE NEGATIVE    Comment: (NOTE) SARS-CoV-2 target nucleic acids are NOT DETECTED.  The SARS-CoV-2 RNA is generally detectable in upper respiratory specimens during the acute phase of infection. The lowest concentration of SARS-CoV-2 viral copies this assay can detect is 138 copies/mL. A negative result does not preclude SARS-Cov-2 infection and should not be used as the sole basis for treatment or other patient management decisions. A negative result may occur with  improper specimen collection/handling, submission of specimen other than nasopharyngeal swab, presence of viral mutation(s) within the areas targeted  by this assay, and inadequate number of viral copies(<138 copies/mL). A negative result must be combined with clinical observations, patient history, and epidemiological information. The expected result is Negative.  Fact Sheet for Patients:  EntrepreneurPulse.com.au  Fact Sheet for Healthcare Providers:  IncredibleEmployment.be  This test is no t yet approved  or cleared by the [place] FDA and  has been authorized for detection and/or diagnosis of SARS-CoV-2 by FDA under an Emergency Use Authorization (EUA). This EUA will remain  in effect (meaning this test can be used) for the duration of the COVID-19 declaration under Section 564(b)(1) of the Act, 21 U.S.C.section 360bbb-3(b)(1), unless the authorization is terminated  or revoked sooner.       Influenza A by PCR NEGATIVE NEGATIVE   Influenza B by PCR NEGATIVE NEGATIVE    Comment: (NOTE) The Xpert Xpress SARS-CoV-2/FLU/RSV plus assay is intended as an aid in the diagnosis of influenza from Nasopharyngeal swab specimens and should not be used as a sole basis for treatment. Nasal washings and aspirates are unacceptable for Xpert Xpress SARS-CoV-2/FLU/RSV testing.  Fact Sheet for Patients: EntrepreneurPulse.com.au  Fact Sheet for Healthcare Providers: IncredibleEmployment.be  This test is not yet approved or cleared by the [place] FDA and has been authorized for detection and/or diagnosis of SARS-CoV-2 by FDA under an Emergency Use Authorization (EUA). This EUA will remain in effect (meaning this test can be used) for the duration of the COVID-19 declaration under Section 564(b)(1) of the Act, 21 U.S.C. section 360bbb-3(b)(1), unless the authorization is terminated or revoked.  Performed at [facility], [place] [address] [place], [place] [address]   CBG monitoring, ED     Status: Abnormal   Collection Time: 07/04/21  9:46 AM  Result Value Ref Range   Glucose-Capillary 218 (H) 70 - 99 mg/dL    Comment: Glucose reference range applies only to samples taken after fasting for at least 8 hours.  I-stat chem 8, ED (not at [facility])     Status: Abnormal   Collection Time: 07/04/21  9:51 AM  Result Value Ref Range   Sodium 143 135 - 145 mmol/L   Potassium 4.3 3.5 - 5.1 mmol/L   Chloride 107 98 - 111 mmol/L   BUN 36 (H) 8 - 23  mg/dL   Creatinine, Ser 6.00 (H) 0.44 - 1.00 mg/dL   Glucose, Bld 228 (H) 70 - 99 mg/dL    Comment: Glucose reference range applies only to samples taken after fasting for at least 8 hours.   Calcium, Ion 1.28 1.15 - 1.40 mmol/L   TCO2 28 22 - 32 mmol/L   Hemoglobin 11.6 (L) 12.0 - 15.0 g/dL   HCT 34.0 (L) 36.0 - 27.5 %  Basic metabolic panel     Status: Abnormal   Collection Time: 07/04/21 10:18 AM  Result Value Ref Range   Sodium 134 (L) 135 - 145 mmol/L    Comment: POST-ULTRACENTRIFUGATION   Potassium 4.4 3.5 - 5.1 mmol/L    Comment: POST-ULTRACENTRIFUGATION   Chloride 100 98 - 111 mmol/L   CO2 19 (L) 22 - 32 mmol/L   Glucose, Bld 205 (H) 70 - 99 mg/dL    Comment: Glucose reference range applies only to samples taken after fasting for at least 8 hours.   BUN 28 (H) 8 - 23 mg/dL   Creatinine, Ser 5.84 (H) 0.44 - 1.00 mg/dL   Calcium 9.0 8.9 - 10.3 mg/dL   GFR, Estimated 7 (L) >60 mL/min  Comment: (NOTE) Calculated using the CKD-EPI Creatinine Equation (2021)    Anion gap 15 5 - 15    Comment: Performed at [facility], [place] [address], [place], [place] [address]  CBC     Status: Abnormal   Collection Time: 07/04/21 10:18 AM  Result Value Ref Range   WBC 13.7 (H) 4.0 - 10.5 K/uL   RBC 3.45 (L) 3.87 - 5.11 MIL/uL   Hemoglobin 10.6 (L) 12.0 - 15.0 g/dL   HCT 34.0 (L) 36.0 - 46.0 %   MCV 98.6 80.0 - 100.0 fL   MCH 30.7 26.0 - 34.0 pg   MCHC 31.2 30.0 - 36.0 g/dL   RDW 17.1 (H) 11.5 - 15.5 %   Platelets 126 (L) 150 - 400 K/uL   nRBC 0.2 0.0 - 0.2 %    Comment: Performed at [facility] [address] [place], [place] [address]  Lactic acid, plasma     Status: Abnormal   Collection Time: 07/04/21 10:18 AM  Result Value Ref Range   Lactic Acid, Venous 4.1 (HH) 0.5 - 1.9 mmol/L    Comment: CRITICAL RESULT CALLED TO, READ BACK BY AND VERIFIED WITH: Shirline Frees, RN 1147 07/04/21 L. KLAR Performed at [facility], [place] [address], [place], [place] [address]    Magnesium     Status: None   Collection Time: 07/04/21 10:18 AM  Result Value Ref Range   Magnesium 2.0 1.7 - 2.4 mg/dL    Comment: Performed at [facility] [address] [place], [place] [address]  Troponin I (High Sensitivity)     Status: Abnormal   Collection Time: 07/04/21 10:18 AM  Result Value Ref Range   Troponin I (High Sensitivity) 418 (HH) <18 ng/L    Comment: CRITICAL RESULT CALLED TO, READ BACK BY AND VERIFIED WITH: Leodis Rains, RN 1204 07/04/21 L. KLAR (NOTE) Elevated high sensitivity troponin I (hsTnI) values and significant  changes across serial measurements may suggest ACS but many other  chronic and acute conditions are known to elevate hsTnI results.  Refer to the Links section for chest pain algorithms and additional  guidance. Performed at [facility], [place] [address], [place], [place] [address]   Ammonia     Status: Abnormal   Collection Time: 07/04/21 10:18 AM  Result Value Ref Range   Ammonia 68 (H) 9 - 35 umol/L    Comment: Performed at [facility], [place] [address], [place], [place] [address]  Hepatic function panel     Status: Abnormal   Collection Time: 07/04/21 10:18 AM  Result Value Ref Range   Total Protein 5.2 (L) 6.5 - 8.1 g/dL    Comment: POST-ULTRACENTRIFUGATION   Albumin 2.7 (L) 3.5 - 5.0 g/dL   AST 93 (H) 15 - 41 U/L   ALT 60 (H) 0 - 44 U/L   Alkaline Phosphatase 71 38 - 126 U/L   Total Bilirubin 1.2 0.3 - 1.2 mg/dL   Bilirubin, Direct 0.2 0.0 - 0.2 mg/dL    Comment: POST-ULTRACENTRIFUGATION   Indirect Bilirubin 1.0 (H) 0.3 - 0.9 mg/dL    Comment: Performed at [facility] [address], [place],  [address]  Urinalysis, Routine w reflex microscopic Urine, Unspecified Source     Status: Abnormal   Collection Time: 07/04/21 12:05 PM  Result Value Ref Range   Color, Urine YELLOW YELLOW   APPearance HAZY (A) CLEAR   Specific Gravity, Urine 1.015  1.005 - 1.030   pH 8.5 (H) 5.0 - 8.0   Glucose, UA >=500  (A) NEGATIVE mg/dL   Hgb urine dipstick SMALL (A) NEGATIVE   Bilirubin Urine NEGATIVE NEGATIVE   Ketones, ur NEGATIVE NEGATIVE mg/dL   Protein, ur 100 (A) NEGATIVE mg/dL   Nitrite NEGATIVE NEGATIVE   Leukocytes,Ua NEGATIVE NEGATIVE    Comment: Performed at [facility] [address], [place], [place] [address]  Urinalysis, Microscopic (reflex)     Status: None   Collection Time: 07/04/21 12:05 PM  Result Value Ref Range   RBC / HPF 0-5 0 - 5 RBC/hpf   WBC, UA 0-5 0 - 5 WBC/hpf   Bacteria, UA NONE SEEN NONE SEEN   Squamous Epithelial / LPF 0-5 0 - 5   Mucus PRESENT     Comment: Performed at [facility], [place] [address], [place], [place] [address]  I-Stat arterial blood gas, ED     Status: Abnormal   Collection Time: 07/04/21 12:34 PM  Result Value Ref Range   pH, Arterial 7.460 (H) 7.350 - 7.450   pCO2 arterial 37.8 32.0 - 48.0 mmHg   pO2, Arterial 128 (H) 83.0 - 108.0 mmHg   Bicarbonate 26.9 20.0 - 28.0 mmol/L   TCO2 28 22 - 32 mmol/L   O2 Saturation 99.0 %   Acid-Base Excess 3.0 (H) 0.0 - 2.0 mmol/L   Sodium 143 135 - 145 mmol/L   Potassium 4.0 3.5 - 5.1 mmol/L   Calcium, Ion 1.27 1.15 - 1.40 mmol/L   HCT 33.0 (L) 36.0 - 46.0 %   Hemoglobin 11.2 (L) 12.0 - 15.0 g/dL   Sample type ARTERIAL   Brain natriuretic peptide     Status: Abnormal   Collection Time: 07/04/21  1:22 PM  Result Value Ref Range   B Natriuretic Peptide 1,308.5 (H) 0.0 - 100.0 pg/mL    Comment: Performed at [facility], [place] [address], [place], [place] [address]  Troponin I (High Sensitivity)     Status: Abnormal   Collection Time: 07/04/21  1:25 PM  Result Value Ref Range   Troponin I (High Sensitivity) 330 (HH) <18 ng/L    Comment: CRITICAL VALUE NOTED.  VALUE IS CONSISTENT WITH PREVIOUSLY REPORTED AND CALLED VALUE. (NOTE) Elevated high sensitivity troponin I (hsTnI) values and significant  changes across serial measurements may suggest ACS but many other  chronic and acute  conditions are known to elevate hsTnI results.  Refer to the Links section for chest pain algorithms and additional  guidance. Performed at [facility], [place] [address] [place], [place] [address]      ROS: Patient unable to give history see HPI, but daughter relates only to shortness of breath occurring acutely otherwise was in her normal state of health.  No reported history of chills fever   Physical Exam: Vitals:   07/04/21 1445 07/04/21 1500  BP: (!) 193/84 (!) 171/67  Pulse: 73 73  Resp: 16 (!) 23  Temp: (!) 96.2 F (35.7 C) (!) 96.6 F (35.9 C)  SpO2: 100% 100%     General: Elderly female, thin chronically ill intubated, NAD HEENT: [place], intubated Neck: No JVD Heart: RRR, soft 1/6 SEM versus thrill from AV upper arm AV Gore-Tex graft, no appreciated RG Lungs: Mechanically ventilated CTA Abdomen: NABS, soft, NT, ND Extremities: No pedal edema Skin: Warm dry no appreciated pedal ulcers or acute rash Neuro: Intubated eyes open moves extremities to command Dialysis Access:  LUA AV GG positive bruit   OP Dialysis Orders:  [facility] , TTS ,EDW 64.5 kg  2K 2 CA bath 400/500 excess LUA AV GG No heparin Sensipar 30 mg Q dialysis Hectorol 5 MCG q. dialysis Mircera 75 MCG Shaili every 2 weeks last given 1201/20 Venofer 50 mg q. weekly dialysis  Assessment/Plan Out of hospital PEA arrest= currently intubated PCCM management ESRD -HD TTS, last HD on schedule 12/06, initial K 4.0, intubated with O2 sat 100% right transferred to ICU for possible HD today and will discuss with Dr. Johnney Ou Acute respiratory failure with hypoxia/aspiration NAD versus CAP= on IV antibiotics per PCM/ Ho COPD Acute encephalopathy, metabolic/hypoxia work-up per admit Hypertension/volume  -BP initially 165/72, PCM holding meds for now after equilibrates from sedation changes, no gross edema  on exam.  Anemia  -Hgb 11 current ESA needs last given 06/27/2021 ,weekly Venofer on HD hold till next  week Metabolic bone disease -Hectorol q. Dialysis hold till calcium level back, Auryxia as binder when taking p.o.'s Nutrition -renal carb modified when taking p.o.'s  Ernest Haber, PA-C [place] [phone] 07/04/2021, 3:11 PM

## 2021-07-04 NOTE — Sedation Documentation (Signed)
Portable Xray at bedside.

## 2021-07-04 NOTE — H&P (Signed)
NAME:  Stacey Dorsey, MRN:  [id number], DOB:  05-24-1933, LOS: 0 ADMISSION DATE:  07/04/2021, CONSULTATION DATE:  07/04/21 REFERRING MD:  Gilford Raid - EM, CHIEF COMPLAINT:  Cardiac arrest   History of Present Illness:  87 yo F hx ESRD on HD, poorly controlled HTN, DM, HLD, (presumed) COPD  who presented to [facility] 12/8 following cardiac arrest. Patient was in usual state of health until 12/8 morning when she felt short of breath while getting ready to go to dialysis. EMS was called, on EMS arrival pt was altered and subsequently sustained witnessed cardiac arrest -- PEA. Received 15 min CPR, 1 epi, 1g calcium, 50mg  bicarb, as well as 154mcg fent and  5mg  versed. King placed in field which was exchanged for ETT in ED. Started on rocephin and azithromycin.   PCCM consulted for admission in this setting  Labs are limited at time of consultation -- BMP not resulted ABG not resulted   CBC fairly benign. Plt are low at 126, hgb low at 10.6 and WBC slightly elevated at 13.7 Trop - 418 LA 4.1 ECG without evidence of STEMI CT H without acute abnormality.  CXR with R perihilar ASD.  CTA chest ordered and pending   Pertinent  Medical History  ESRD    Significant Hospital Events: Including procedures, antibiotic start and stop dates in addition to other pertinent events   12/8 OOH witnessed arrest. 15 min CPR before ROSC. PEA, precipitated by SOB, resp failure. Admitting to ICU   Interim History / Subjective:  Hypertensive in ED  Intubated and on 45 prop    I personally shut off propofol so neuro status could be assessed. Notified bedside RN that prop has been shut off.   Objective   Blood pressure (!) 170/68, pulse 65, temperature (!) 95.1 F (35.1 C), resp. rate 17, height 5\' 9"  (1.753 m), weight 80 kg, SpO2 100 %.    Vent Mode: PRVC FiO2 (%):  [60 %] 60 % Set Rate:  [15 bmp] 15 bmp Vt Set:  [520 mL-540 mL] 520 mL PEEP:  [5 cmH20] 5 cmH20 Plateau Pressure:  [21 cmH20] 21 cmH20  No  intake or output data in the 24 hours ending 07/04/21 1233 Filed Weights   07/04/21 0900  Weight: 80 kg    Examination: General: Critically and chronically ill appearing elderly F intubated NAD HENT: NCAT pink mm. Copious thick clear oral secretions. ETTsecure Lungs: CTAb symmetrical chest expansion, mechanically ventilated  Cardiovascular: RRR s1s2 no rgm  Abdomen: soft ndnt + bowel sounds  Extremities: LLE I/O. RUE 2 PIVs. No acute joint deformity. No extremity edema, erythema.  Neuro: With propofol off: Drowsy.Pupils 38mm reactive. Awakens to voice. Moving BUE BLE spontaneously. Following commands.  GU: clear yellow urine   Resolved Hospital Problem list     Assessment & Plan:   Out of hospital PEA arrest -preceding SOB. Likely hypoxia OOH.  -Does have R sided ASD : possible aspiration or CAP. Flu and Covid neg.  -consider also possible PE  P -CTA ordered in Ed, pending -ECHO  - no indication for targeted temp management as pt seems to be neurologically intact   Acute encephalopathy, metabolic -initially hypoxia, hypoperfusion. Now sedation related  -CT H no acute abnormality. Neuro exam promising on PCCM exam in ED. -does have degree of hyperammonemia  P -dc prop -PRN fent  -if sedation needed with PRN fent, will order precedex  -lactulose   Acute respiratory failure with hypoxia R sided ASD : Aspiration PNA  vs CAP  Presumed COPD on chronic O2 -managed by PCP, I don't see PFTs. No features of AECOPD now P -cont MV  -wean support as able -WUA/SBT  -send extended RVP  -send tracheal aspirate -cont empiric abx  ESRD P -nephro consult   HTN P -hold restarting meds -- will see how she equilibrates with sedation changes  -HD would also help   DM2 P -SSI   Thrombocytopenia Anemia -suspect chronic in setting of ESRD  P -trend CBC no indication for transfusion right now   Best Practice (right click and "Reselect all SmartList Selections" daily)    Diet/type: NPO DVT prophylaxis: prophylactic heparin  GI prophylaxis: PPI Lines: N/A Foley:  Yes, and it is still needed Code Status:  full code Last date of multidisciplinary goals of care discussion [12/8 - daughter ]  Labs   CBC: Recent Labs  Lab 07/04/21 0951 07/04/21 1018  WBC  --  13.7*  HGB 11.6* 10.6*  HCT 34.0* 34.0*  MCV  --  98.6  PLT  --  126*    Basic Metabolic Panel: Recent Labs  Lab 07/04/21 0951  NA 143  K 4.3  CL 107  GLUCOSE 228*  BUN 36*  CREATININE 6.00*   GFR: Estimated Creatinine Clearance: 7.6 mL/min (A) (by C-G formula based on SCr of 6 mg/dL (H)). Recent Labs  Lab 07/04/21 1018  WBC 13.7*  LATICACIDVEN 4.1*    Liver Function Tests: No results for input(s): AST, ALT, ALKPHOS, BILITOT, PROT, ALBUMIN in the last 168 hours. No results for input(s): LIPASE, AMYLASE in the last 168 hours. No results for input(s): AMMONIA in the last 168 hours.  ABG    Component Value Date/Time   TCO2 28 07/04/2021 0951     Coagulation Profile: No results for input(s): INR, PROTIME in the last 168 hours.  Cardiac Enzymes: No results for input(s): CKTOTAL, CKMB, CKMBINDEX, TROPONINI in the last 168 hours.  HbA1C: No results found for: HGBA1C  CBG: Recent Labs  Lab 07/04/21 0946  GLUCAP 218*    Review of Systems:   Unable to obtain, Intubated   Past Medical History:  She,  has no past medical history on file.   Surgical History:  AVF   Social History:     Glass of wine, occasionally Family History:  Father - cancer Son - DM   Allergies Allergies  Allergen Reactions   Penicillins    Shellfish Allergy      Home Medications  Prior to Admission medications   Medication Sig Start Date End Date Taking? Authorizing Provider  AURYXIA 1 GM 210 MG(Fe) tablet Take 1 tablet by mouth 3 (three) times daily. 07/04/21   [provider]  carvedilol (COREG) 12.5 MG tablet Take 12.5 mg by mouth 2 (two) times daily. 05/01/21    [provider]  NIFEdipine (PROCARDIA-XL/NIFEDICAL-XL) 30 MG 24 hr tablet Take 30 mg by mouth at bedtime. 05/01/21   [provider]  omeprazole (PRILOSEC) 20 MG capsule Take 20 mg by mouth 2 (two) times daily. 05/08/21   [provider]  rOPINIRole (REQUIP) 0.5 MG tablet Take 0.5 mg by mouth at bedtime. 05/03/21   [provider]  simvastatin (ZOCOR) 20 MG tablet Take 20 mg by mouth at bedtime. 05/03/21   [provider]     Critical care time: 55 minutes     CRITICAL CARE Performed by: Cristal Generous   Total critical care time: 55 minutes  Critical care time was exclusive  of separately billable procedures and treating other patients.  Critical care was necessary to treat or prevent imminent or life-threatening deterioration.  Critical care was time spent personally by me on the following activities: development of treatment plan with patient and/or surrogate as well as nursing, discussions with consultants, evaluation of patient's response to treatment, examination of patient, obtaining history from patient or surrogate, ordering and performing treatments and interventions, ordering and review of laboratory studies, ordering and review of radiographic studies, pulse oximetry and re-evaluation of patient's condition.  Eliseo Gum MSN, AGACNP-BC [place] for pager  07/04/2021, 2:06 PM

## 2021-07-04 NOTE — ED Provider Notes (Signed)
[facility] DEPART[facility] Provider Note   CSN: [id number] Arrival date & time: 07/04/21  9518     History Chief Complaint  Patient presents with   Respiratory Arrest    Stacey Dorsey is a 87 y.o. female.  Pt presents to the ED today s/p CPR.  Pt was getting ready for dialysis.  She was sob, so family called EMS.  Pt lost pulses for EMS and was in PEA.  Pt had 15 minutes of CPR.  She was given 1 epi, 1 calcium, 1 amp bicarb.  They placed a king airway, but she started to bite on it and was a little agitated, so she was given 100 mcg fentanyl and 5 of versed as well.   PMhx: Chronic kidney disease Date Unknown COPD (chronic obstructive pulmonary disease) ([place]) Date Unknown Diabetes mellitus without complication ([place]) Date Unknown Hyperlipidemia Date Unknown Hypertension     OB History   No obstetric history on file.     No family history on file.  Surg Hx:   Ir removal tun cv cath w/o fl Date Unknown Abdominal hysterectomy Date Unknown Av fistula placement  Home Medications Prior to Admission medications   Medication Sig Start Date End Date Taking? Authorizing Provider  AURYXIA 1 GM 210 MG(Fe) tablet Take 1 tablet by mouth 3 (three) times daily. 07/04/21   [provider]  carvedilol (COREG) 12.5 MG tablet Take 12.5 mg by mouth 2 (two) times daily. 05/01/21   [provider]  lidocaine-prilocaine (EMLA) cream Apply 1 application topically 3 (three) times a week. 04/05/21   [provider]  NIFEdipine (PROCARDIA-XL/NIFEDICAL-XL) 30 MG 24 hr tablet Take 30 mg by mouth at bedtime. 05/01/21   [provider]  omeprazole (PRILOSEC) 20 MG capsule Take 20 mg by mouth 2 (two) times daily. 05/08/21   [provider]  rOPINIRole (REQUIP) 0.5 MG tablet Take 0.5 mg by mouth at bedtime. 05/03/21   [provider]  simvastatin (ZOCOR) 20 MG tablet Take 20 mg by mouth at bedtime. 05/03/21   [provider]  albuterol (PROVENTIL) (2.5 MG/3ML) 0.083% nebulizer solution carvedilol (COREG) 25 MG tablet fluticasone-salmeterol (ADVAIR HFA) 115-21 MCG/ACT inhaler ibuprofen (ADVIL) 200 MG tablet insulin degludec (TRESIBA FLEXTOUCH) 100 UNIT/ML FlexTouch Pen Menthol-Methyl Salicylate (MUSCLE RUB) 10-15 % CREA NIFEdipine (PROCARDIA XL/NIFEDICAL XL) 60 MG 24 hr tablet omeprazole (PRILOSEC) 20 MG capsule Polyethyl Glycol-Propyl Glycol (SYSTANE OP) rOPINIRole (REQUIP) 0.5 MG tablet Sennosides (SENNA) 8.6 MG CAPS simvastatin (ZOCOR) 20 MG tablet  Allergies    Penicillins and Shellfish allergy  Review of Systems   Review of Systems  Unable to perform ROS: Mental status change   Physical Exam Updated Vital Signs BP (!) 171/70   Pulse 65   Temp (!) 95 F (35 C)   Resp 15   Ht 5\' 9"  (1.753 m)   Wt 80 kg   SpO2 100%   BMI 26.05 kg/m   Physical Exam Vitals and nursing note reviewed.  Constitutional:      General: She is in acute distress.     Appearance: She is ill-appearing and toxic-appearing.  HENT:     Head: Normocephalic and atraumatic.     Right Ear: External ear normal.     Left Ear: External ear normal.     Nose: Nose normal.     Mouth/Throat:     Mouth: Mucous membranes are moist.     Pharynx: Oropharynx is clear.  Eyes:     Pupils:  Pupils are equal, round, and reactive to light.  Cardiovascular:     Rate and Rhythm: Normal rate and regular rhythm.     Pulses: Normal pulses.     Heart sounds: Normal heart sounds.  Pulmonary:     Effort: Bradypnea present.     Comments: Breaths assisted by BVM Abdominal:     General: Abdomen is flat. Bowel sounds are normal.     Palpations: Abdomen is soft.  Musculoskeletal:        General: Normal range of motion.  Lymphadenopathy:     Cervical: No cervical adenopathy.  Skin:    General: Skin is warm.     Capillary Refill: Capillary refill takes less than 2 seconds.  Neurological:     Mental Status: She is  unresponsive.  Psychiatric:     Comments: Unable to assess    ED Results / Procedures / Treatments   Labs (all labs ordered are listed, but only abnormal results are displayed) Labs Reviewed  BASIC METABOLIC PANEL - Abnormal; Notable for the following components:      Result Value   Sodium 134 (*)    CO2 19 (*)    Glucose, Bld 205 (*)    BUN 28 (*)    Creatinine, Ser 5.84 (*)    GFR, Estimated 7 (*)    All other components within normal limits  CBC - Abnormal; Notable for the following components:   WBC 13.7 (*)    RBC 3.45 (*)    Hemoglobin 10.6 (*)    HCT 34.0 (*)    RDW 17.1 (*)    Platelets 126 (*)    All other components within normal limits  LACTIC ACID, PLASMA - Abnormal; Notable for the following components:   Lactic Acid, Venous 4.1 (*)    All other components within normal limits  URINALYSIS, ROUTINE W REFLEX MICROSCOPIC - Abnormal; Notable for the following components:   APPearance HAZY (*)    pH 8.5 (*)    Glucose, UA >=500 (*)    Hgb urine dipstick SMALL (*)    Protein, ur 100 (*)    All other components within normal limits  AMMONIA - Abnormal; Notable for the following components:   Ammonia 68 (*)    All other components within normal limits  HEPATIC FUNCTION PANEL - Abnormal; Notable for the following components:   Total Protein 5.2 (*)    Albumin 2.7 (*)    AST 93 (*)    ALT 60 (*)    Indirect Bilirubin 1.0 (*)    All other components within normal limits  I-STAT CHEM 8, ED - Abnormal; Notable for the following components:   BUN 36 (*)    Creatinine, Ser 6.00 (*)    Glucose, Bld 228 (*)    Hemoglobin 11.6 (*)    HCT 34.0 (*)    All other components within normal limits  CBG MONITORING, ED - Abnormal; Notable for the following components:   Glucose-Capillary 218 (*)    All other components within normal limits  I-STAT ARTERIAL BLOOD GAS, ED - Abnormal; Notable for the following components:   pH, Arterial 7.460 (*)    pO2, Arterial 128 (*)     Acid-Base Excess 3.0 (*)    HCT 33.0 (*)    Hemoglobin 11.2 (*)    All other components within normal limits  TROPONIN I (HIGH SENSITIVITY) - Abnormal; Notable for the following components:   Troponin I (High Sensitivity) 418 (*)    All  other components within normal limits  RESP PANEL BY RT-PCR (FLU A&B, COVID) ARPGX2  CULTURE, BLOOD (ROUTINE X 2)  CULTURE, BLOOD (ROUTINE X 2)  RESPIRATORY PANEL BY PCR  CULTURE, RESPIRATORY W GRAM STAIN  MAGNESIUM  URINALYSIS, MICROSCOPIC (REFLEX)  BLOOD GAS, ARTERIAL  BRAIN NATRIURETIC PEPTIDE  TROPONIN I (HIGH SENSITIVITY)    EKG EKG Interpretation  Date/Time:  Thursday July 04 2021 09:36:54 EST Ventricular Rate:  84 PR Interval:  178 QRS Duration: 85 QT Interval:  408 QTC Calculation: 483 R Axis:   66 Text Interpretation: Sinus rhythm Left ventricular hypertrophy no stemi Confirmed by Isla Pence [phone]) on 07/04/2021 10:00:54 AM  Radiology CT Head Wo Contrast  Result Date: 07/04/2021 CLINICAL DATA:  Altered mental status EXAM: CT HEAD WITHOUT CONTRAST TECHNIQUE: Contiguous axial images were obtained from the base of the skull through the vertex without intravenous contrast. COMPARISON:  None. FINDINGS: Brain: There are no signs of bleeding within the cranium. There is moderate sized area of decreased density in the left frontal lobe suggesting old infarct. Cortical sulci are prominent, more so in the left temporal region. Calcifications are seen in the basal ganglia on both sides. Vascular: There are scattered arterial calcifications. Skull: Unremarkable Sinuses/Orbits: There are no air-fluid levels in the paranasal sinuses. There is mild mucosal thickening in the ethmoid and sphenoid sinuses. Secretions are seen in the lumen of nasopharynx. Other: There is increased amount of CSF insula suggesting partial empty sella. IMPRESSION: No acute intracranial findings are seen in noncontrast CT brain. Encephalomalacia in the left frontal lobe  suggests old infarct. Atrophy. Electronically Signed   By: Elmer Picker M.D.   On: 07/04/2021 10:49   CT Angio Chest PE W and/or Wo Contrast  Result Date: 07/04/2021 CLINICAL DATA:  CPR with ROSC obtained, suspect PE EXAM: CT ANGIOGRAPHY CHEST WITH CONTRAST TECHNIQUE: Multidetector CT imaging of the chest was performed using the standard protocol during bolus administration of intravenous contrast. Multiplanar CT image reconstructions and MIPs were obtained to evaluate the vascular anatomy. CONTRAST:  132mL OMNIPAQUE IOHEXOL 350 MG/ML SOLN COMPARISON:  Same-day chest radiograph, CT chest 01/01/2021 FINDINGS: Cardiovascular: There is adequate opacification of the pulmonary arteries to the subsegmental level. There is no evidence of pulmonary embolism. The heart is enlarged. There are mitral annular calcifications, mild aortic valve calcifications, and scattered coronary artery calcifications. There is mild calcified atherosclerotic plaque in the nonaneurysmal thoracic aorta. There is reflux of contrast into the IVC suggesting right heart dysfunction. Mediastinum/Nodes: The thyroid is unremarkable. The esophagus is grossly unremarkable. There is no mediastinal, hilar, or axillary lymphadenopathy. Lungs/Pleura: The endotracheal tube tip is approximally 6 mm from the carina and approaches the right main bronchus. The trachea and central airways are patent. There is consolidation in the dependent lower lobes, right significantly worse than left. There are additional patchy opacities and interlobular septal thickening in the lung apices and left lung base. There are small bilateral pleural effusions. There is no pneumothorax. Upper Abdomen: The enteric catheter tip is in the distal stomach. A right adrenal adenoma is unchanged there are no acute findings in the upper abdomen. Musculoskeletal: There are acute fractures of the right fourth and sixth through eighth ribs and left sixth through ninth ribs. Review of  the MIP images confirms the above findings. IMPRESSION: 1. No evidence of pulmonary embolism. 2. Findings above suggesting heart failure including cardiomegaly, patchy ground-glass opacities and interlobular septal thickening likely reflecting mild to moderate pulmonary interstitial edema, and small bilateral pleural effusions.  Additionally, reflux of contrast into the IVC suggests right heart dysfunction. 3. Consolidations in the bilateral lower lobes may reflect atelectasis, though pneumonia can not be excluded. 4. Multiple bilateral rib fractures likely related to CPR. 5. Endotracheal tube tip approaching the right main bronchus. Recommend retraction by approximately 1-2 cm. Electronically Signed   By: Valetta Mole M.D.   On: 07/04/2021 13:21   DG Chest Portable 1 View  Result Date: 07/04/2021 CLINICAL DATA:  Code, post intubation EXAM: PORTABLE CHEST 1 VIEW COMPARISON:  01/01/2021 FINDINGS: Endotracheal tube is 3 cm above the carina. NG tube tip is near the GE junction. No confluent opacity on the left. Patchy airspace disease in the right perihilar region and right lower lobe with volume loss and elevation of the right hemidiaphragm. No visible effusions or pneumothorax. No acute bony abnormality. IMPRESSION: Airspace disease in the right perihilar region and right lower lobe concerning for pneumonia. Volume loss on the right. Electronically Signed   By: Rolm Baptise M.D.   On: 07/04/2021 10:13    Procedures Procedure Name: Intubation Date/Time: 07/04/2021 10:12 AM Performed by: Isla Pence, MD Pre-anesthesia Checklist: Patient identified, Patient being monitored, Emergency Drugs available, Timeout performed and Suction available Oxygen Delivery Method: Non-rebreather mask Preoxygenation: Pre-oxygenation with 100% oxygen Induction Type: Rapid sequence Ventilation: Mask ventilation without difficulty Laryngoscope Size: Glidescope and 3 Tube size: 7.5 mm Number of attempts: 1 Placement  Confirmation: ETT inserted through vocal cords under direct vision, CO2 detector and Breath sounds checked- equal and bilateral Secured at: 23 cm Tube secured with: ETT holder Dental Injury: Teeth and Oropharynx as per pre-operative assessment       Medications Ordered in ED Medications  etomidate (AMIDATE) injection (20 mg Intravenous Given 07/04/21 0934)  propofol (DIPRIVAN) 1000 MG/100ML infusion (0 mcg/kg/min  80 kg Intravenous Stopped 07/04/21 1337)  rocuronium (ZEMURON) injection (80 mg Intravenous Given 07/04/21 0935)  azithromycin (ZITHROMAX) 500 mg in sodium chloride 0.9 % 250 mL IVPB (0 mg Intravenous Stopped 07/04/21 1324)  iohexol (OMNIPAQUE) 350 MG/ML injection 100 mL (has no administration in time range)  heparin injection 5,000 Units (has no administration in time range)  pantoprazole (PROTONIX) injection 40 mg (has no administration in time range)  lactated ringers infusion (0 mLs Intravenous Hold 07/04/21 1324)  acetaminophen (TYLENOL) tablet 650 mg (has no administration in time range)  docusate (COLACE) 50 MG/5ML liquid 100 mg (has no administration in time range)  polyethylene glycol (MIRALAX / GLYCOLAX) packet 17 g (has no administration in time range)  ondansetron (ZOFRAN) injection 4 mg (has no administration in time range)  docusate (COLACE) 50 MG/5ML liquid 100 mg (100 mg Per Tube Not Given 07/04/21 1323)  polyethylene glycol (MIRALAX / GLYCOLAX) packet 17 g (17 g Per Tube Not Given 07/04/21 1324)  fentaNYL (SUBLIMAZE) injection 25 mcg (has no administration in time range)  fentaNYL (SUBLIMAZE) injection 25-100 mcg (has no administration in time range)  cefTRIAXone (ROCEPHIN) 1 g in sodium chloride 0.9 % 100 mL IVPB (0 g Intravenous Stopped 07/04/21 1206)  sodium chloride 0.9 % bolus 1,000 mL (1,000 mLs Intravenous New Bag/Given 07/04/21 1231)  iohexol (OMNIPAQUE) 350 MG/ML injection 100 mL (100 mLs Intravenous Contrast Given 07/04/21 1303)    ED Course  I have  reviewed the triage vital signs and the nursing notes.  Pertinent labs & imaging results that were available during my care of the patient were reviewed by me and considered in my medical decision making (see chart for details).  MDM Rules/Calculators/A&P                           Pt intubated for bradypnea and airway protection.  CXR shows pna.  Pt given rocephin and zithromax.  Pt give IVFs.  Covid/flu neg.  Pt's head ct neg.  CTA shows no PE.  She has pulmonary edema and pna vs atelectasis.  She also has multiple rib fx (likely from CPR).  Pt's troponin elevated.  Pt d/w CCM for admission.  CRITICAL CARE Performed by: Isla Pence   Total critical care time: 45 minutes  Critical care time was exclusive of separately billable procedures and treating other patients.  Critical care was necessary to treat or prevent imminent or life-threatening deterioration.  Critical care was time spent personally by me on the following activities: development of treatment plan with patient and/or surrogate as well as nursing, discussions with consultants, evaluation of patient's response to treatment, examination of patient, obtaining history from patient or surrogate, ordering and performing treatments and interventions, ordering and review of laboratory studies, ordering and review of radiographic studies, pulse oximetry and re-evaluation of patient's condition.   Stacey Dorsey was evaluated in Emergency Department on 07/04/2021 for the symptoms described in the history of present illness. She was evaluated in the context of the global COVID-19 pandemic, which necessitated consideration that the patient might be at risk for infection with the SARS-CoV-2 virus that causes COVID-19. Institutional protocols and algorithms that pertain to the evaluation of patients at risk for COVID-19 are in a state of rapid change based on information released by regulatory bodies including the CDC and federal and  state organizations. These policies and algorithms were followed during the patient's care in the ED.    Final Clinical Impression(s) / ED Diagnoses Final diagnoses:  Cardiac arrest ([place])  Acute respiratory failure, unspecified whether with hypoxia or hypercapnia ([place])  ESRD on hemodialysis ([place])  Community acquired pneumonia of right lower lobe of lung  Elevated troponin  Multiple fractures of ribs, bilateral, initial encounter for closed fracture  Acute on chronic congestive heart failure, unspecified heart failure type [facility])    Rx / DC Orders ED Discharge Orders     None        Isla Pence, MD 07/04/21 1340

## 2021-07-04 NOTE — Progress Notes (Signed)
  Echocardiogram 2D Echocardiogram has been performed.  Stacey Dorsey 07/04/2021, 3:32 PM

## 2021-07-04 NOTE — ED Triage Notes (Signed)
Pt BIB EMS from home as post CPR. Originally called out for SOB. After fire dept arrived, pt went into respiratory arrest and lost pulses. Per EMS, pt received 15 minutes of CPR, then ROSC was obtained. IO established to L tibia.  Per EMS: PEA until pulses obtained, then ST. 1 Epi, 100 mcg Fentanyl, 5 Versed, 50 bicarb, 1g calcium

## 2021-07-04 NOTE — Progress Notes (Signed)
eLink Physician-Brief Progress Note Patient Name: Stacey Dorsey DOB: 1933/04/29 MRN: [id number]   Date of Service  07/04/2021  HPI/Events of Note  Pt hypertensive to 197/99.  Review of BP's shows her to be quite variable.  eICU Interventions  Will use a titratable infusion and avoid enteral meds given the variability in her MAP today     Intervention Category Major Interventions: Hypertension - evaluation and management  Tilden Dome 07/04/2021, 10:50 PM

## 2021-07-04 NOTE — Progress Notes (Signed)
Transported Pt. Via ventilator with no complications. Pt is now on [place] ED and is stable. RT will continue to monitor

## 2021-07-04 NOTE — Progress Notes (Signed)
Pt was transported via ventilator with no complications. Pt is currently stable at this time. Report was given to 51M RT.

## 2021-07-04 NOTE — ED Notes (Signed)
Social research officer, government with verbal order from admitting MD.

## 2021-07-04 NOTE — ED Notes (Signed)
Pt's daughter arrived at bedside. MD notified daughter is requesting an update on pt status and plan of care. Pt's daughter given bag with pt's watch, bracelet, and earrings.

## 2021-07-05 ENCOUNTER — Inpatient Hospital Stay (HOSPITAL_COMMUNITY): Payer: Medicare Other

## 2021-07-05 DIAGNOSIS — J9601 Acute respiratory failure with hypoxia: Secondary | ICD-10-CM

## 2021-07-05 LAB — POCT I-STAT 7, (LYTES, BLD GAS, ICA,H+H)
Acid-Base Excess: 3 mmol/L — ABNORMAL HIGH (ref 0.0–2.0)
Bicarbonate: 25.6 mmol/L (ref 20.0–28.0)
Calcium, Ion: 1.15 mmol/L (ref 1.15–1.40)
HCT: 34 % — ABNORMAL LOW (ref 36.0–46.0)
Hemoglobin: 11.6 g/dL — ABNORMAL LOW (ref 12.0–15.0)
O2 Saturation: 98 %
Patient temperature: 97.3
Potassium: 3.1 mmol/L — ABNORMAL LOW (ref 3.5–5.1)
Sodium: 141 mmol/L (ref 135–145)
TCO2: 27 mmol/L (ref 22–32)
pCO2 arterial: 31.2 mmHg — ABNORMAL LOW (ref 32.0–48.0)
pH, Arterial: 7.519 — ABNORMAL HIGH (ref 7.350–7.450)
pO2, Arterial: 88 mmHg (ref 83.0–108.0)

## 2021-07-05 LAB — GLUCOSE, CAPILLARY
Glucose-Capillary: 109 mg/dL — ABNORMAL HIGH (ref 70–99)
Glucose-Capillary: 114 mg/dL — ABNORMAL HIGH (ref 70–99)
Glucose-Capillary: 116 mg/dL — ABNORMAL HIGH (ref 70–99)
Glucose-Capillary: 121 mg/dL — ABNORMAL HIGH (ref 70–99)
Glucose-Capillary: 132 mg/dL — ABNORMAL HIGH (ref 70–99)
Glucose-Capillary: 95 mg/dL (ref 70–99)

## 2021-07-05 LAB — CBC
HCT: 33.5 % — ABNORMAL LOW (ref 36.0–46.0)
Hemoglobin: 10.4 g/dL — ABNORMAL LOW (ref 12.0–15.0)
MCH: 28.9 pg (ref 26.0–34.0)
MCHC: 31 g/dL (ref 30.0–36.0)
MCV: 93.1 fL (ref 80.0–100.0)
Platelets: 114 10*3/uL — ABNORMAL LOW (ref 150–400)
RBC: 3.6 MIL/uL — ABNORMAL LOW (ref 3.87–5.11)
RDW: 17.1 % — ABNORMAL HIGH (ref 11.5–15.5)
WBC: 8.9 10*3/uL (ref 4.0–10.5)
nRBC: 0.2 % (ref 0.0–0.2)

## 2021-07-05 LAB — BASIC METABOLIC PANEL
Anion gap: 15 (ref 5–15)
BUN: 22 mg/dL (ref 8–23)
CO2: 24 mmol/L (ref 22–32)
Calcium: 8.8 mg/dL — ABNORMAL LOW (ref 8.9–10.3)
Chloride: 101 mmol/L (ref 98–111)
Creatinine, Ser: 4.91 mg/dL — ABNORMAL HIGH (ref 0.44–1.00)
GFR, Estimated: 8 mL/min — ABNORMAL LOW (ref >60)
Glucose, Bld: 109 mg/dL — ABNORMAL HIGH (ref 70–99)
Potassium: 3.2 mmol/L — ABNORMAL LOW (ref 3.5–5.1)
Sodium: 140 mmol/L (ref 135–145)

## 2021-07-05 LAB — TRIGLYCERIDES: Triglycerides: 107 mg/dL (ref <150)

## 2021-07-05 LAB — AMMONIA: Ammonia: 32 umol/L (ref 9–35)

## 2021-07-05 LAB — MAGNESIUM: Magnesium: 1.9 mg/dL (ref 1.7–2.4)

## 2021-07-05 IMAGING — DX DG CHEST 1V PORT
1 series · 1 of 1 positions shown · non-contrast
Comparison: Previous studies including the examination of
[DATE]

CLINICAL DATA: Difficulty breathing

EXAM:
PORTABLE CHEST 1 VIEW

[chest ap]
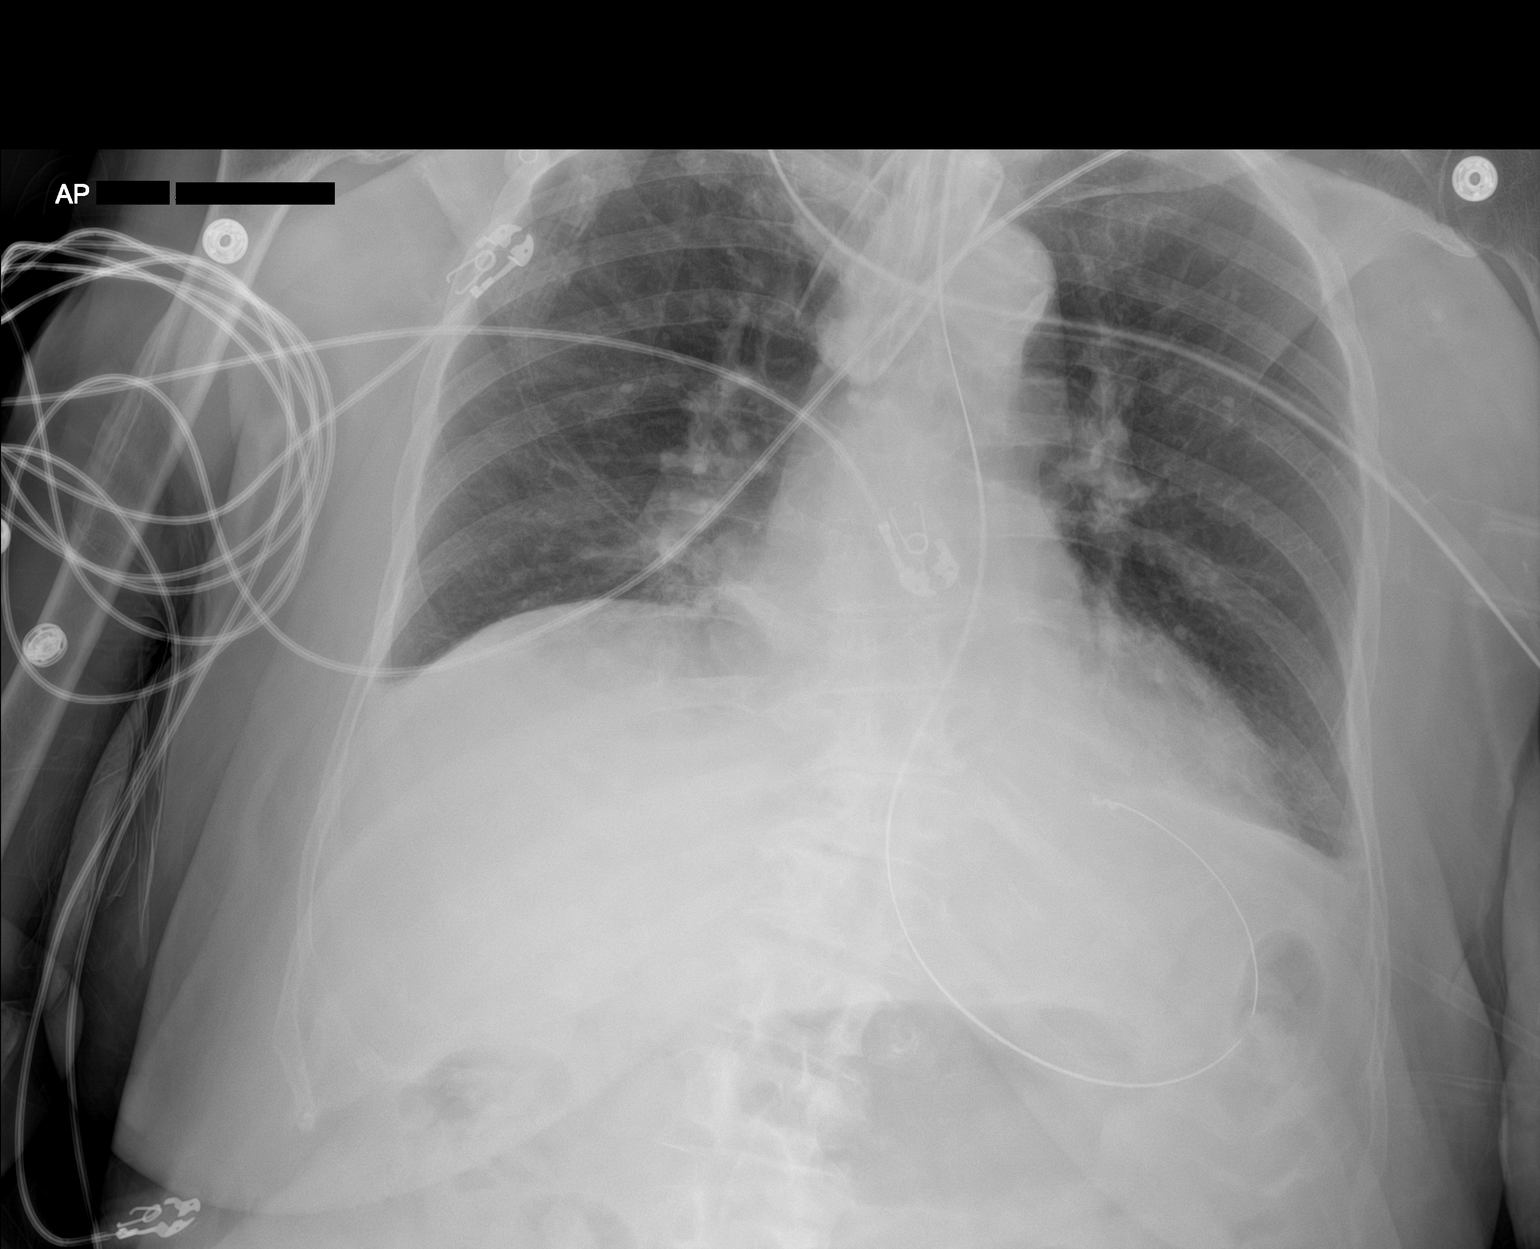

[1 of 1 positions shown; findings below may reference images not displayed]

FINDINGS: Transverse diameter of heart is increased. There is interval
decrease in pulmonary vascular congestion. There is improvement in
aeration of parahilar regions and lower lung fields. Residual
increased markings are seen in the medial lower lung fields. There
is blunting of both lateral CP angles. There is no pneumothorax. Tip
of endotracheal tube is 2.9 cm above the carina. Tip of enteric tube
is seen in the fundus of the stomach.
IMPRESSION: There is interval decrease in pulmonary vascular congestion and
pulmonary edema. Increased markings in the medial lower lung fields
may suggest crowding of normal bronchovascular structures due to
poor inspiration or atelectasis/pneumonitis. Small bilateral pleural
effusions are seen.

## 2021-07-05 MED ORDER — PHENOL 1.4 % MT LIQD
1.0000 | OROMUCOSAL | Status: DC | PRN
  Administered 2021-07-05: 1 via OROMUCOSAL
  Filled 2021-07-05: qty 177

## 2021-07-05 MED ORDER — POTASSIUM CHLORIDE 20 MEQ PO PACK
20.0000 meq | PACK | Freq: Once | ORAL | Status: AC
  Administered 2021-07-05: 20 meq
  Filled 2021-07-05: qty 1

## 2021-07-05 MED ORDER — NEPRO/CARBSTEADY PO LIQD
237.0000 mL | Freq: Two times a day (BID) | ORAL | Status: DC
  Administered 2021-07-06 – 2021-07-16 (×13): 237 mL via ORAL

## 2021-07-05 MED ORDER — RENA-VITE PO TABS
1.0000 | ORAL_TABLET | Freq: Every day | ORAL | Status: DC
  Administered 2021-07-05 – 2021-07-15 (×11): 1 via ORAL
  Filled 2021-07-05 (×11): qty 1

## 2021-07-05 MED ORDER — ORAL CARE MOUTH RINSE
15.0000 mL | Freq: Two times a day (BID) | OROMUCOSAL | Status: DC
  Administered 2021-07-05 (×2): 15 mL via OROMUCOSAL

## 2021-07-05 MED ORDER — MAGNESIUM SULFATE IN D5W 1-5 GM/100ML-% IV SOLN
1.0000 g | Freq: Once | INTRAVENOUS | Status: AC
  Administered 2021-07-05: 1 g via INTRAVENOUS
  Filled 2021-07-05: qty 100

## 2021-07-05 MED ORDER — IPRATROPIUM-ALBUTEROL 0.5-2.5 (3) MG/3ML IN SOLN
3.0000 mL | Freq: Four times a day (QID) | RESPIRATORY_TRACT | Status: DC
  Administered 2021-07-05 – 2021-07-06 (×4): 3 mL via RESPIRATORY_TRACT
  Filled 2021-07-05 (×4): qty 3

## 2021-07-05 MED ORDER — SODIUM CHLORIDE 0.9 % IV SOLN: INTRAVENOUS | Status: DC | PRN

## 2021-07-05 MED ORDER — SODIUM CHLORIDE 0.9 % IV SOLN
2.0000 g | Freq: Every day | INTRAVENOUS | Status: AC
  Administered 2021-07-05 – 2021-07-11 (×7): 2 g via INTRAVENOUS
  Filled 2021-07-05 (×7): qty 20

## 2021-07-05 NOTE — Plan of Care (Addendum)
1530 hrs: Patient fails first attempt at YSS (choking, coughing). Also of note, patient declines removal of Foley at this time despite re-education.    Problem: Education: Goal: Knowledge of General Education information will improve Description: Including pain rating scale, medication(s)/side effects and non-pharmacologic comfort measures Outcome: Progressing   Problem: Health Behavior/Discharge Planning: Goal: Ability to manage health-related needs will improve Outcome: Progressing   Problem: Clinical Measurements: Goal: Ability to maintain clinical measurements within normal limits will improve Outcome: Progressing Goal: Will remain free from infection Outcome: Progressing Goal: Diagnostic test results will improve Outcome: Progressing Goal: Respiratory complications will improve Outcome: Progressing Goal: Cardiovascular complication will be avoided Outcome: Progressing   Problem: Activity: Goal: Risk for activity intolerance will decrease Outcome: Progressing   Problem: Nutrition: Goal: Adequate nutrition will be maintained Outcome: Progressing   Problem: Coping: Goal: Level of anxiety will decrease Outcome: Progressing   Problem: Elimination: Goal: Will not experience complications related to bowel motility Outcome: Progressing Goal: Will not experience complications related to urinary retention Outcome: Progressing   Problem: Pain Managment: Goal: General experience of comfort will improve Outcome: Progressing   Problem: Safety: Goal: Ability to remain free from injury will improve Outcome: Progressing   Problem: Skin Integrity: Goal: Risk for impaired skin integrity will decrease Outcome: Progressing

## 2021-07-05 NOTE — Progress Notes (Signed)
Initial Nutrition Assessment  DOCUMENTATION CODES:   Underweight, Non-severe (moderate) malnutrition in context of chronic illness  INTERVENTION:   - Given pt with malnutrition, recommend Regular diet once diet able to be advanced  - Once diet advanced, Nepro Shake po BID, each supplement provides 425 kcal and 19 grams protein  - Once able to take POs, renal MVI daily  NUTRITION DIAGNOSIS:   Moderate Malnutrition related to chronic illness (ESRD, COPD) as evidenced by moderate fat depletion, severe muscle depletion.  GOAL:   Patient will meet greater than or equal to 90% of their needs  MONITOR:   Diet advancement, Supplement acceptance, Labs, Weight trends, I & O's  REASON FOR ASSESSMENT:   Ventilator, Consult Enteral/tube feeding initiation and management  ASSESSMENT:   87 year old female who presented to the ED on 12/08 after a PEA arrest. Pt required intubation in the ED. PMH of ESRD on HD, HTN, T2DM, HLD, COPD.  12/09 - extubated  Discussed pt with RN and during ICU rounds. Pt still NPO after being extubated this morning. Last HD was 12/08. Treatment was terminated early due to heart rate issues. Post-HD weight was 54.3 kg.  Spoke with pt and daughter at bedside. Pt has been living with daughter recently. Pt reports that she typically eats 3 times a day. Pt does not eat during HD treatments and is very hungry afterwards. For breakfast, pt will typically eat a piece of bacon and a piece of toast. Pt may have something small for lunch. Pt typically eats dinner with the family but has small portions. Pt's daughter recently purchased Ensure for pt to have.  Pt reports a UBW of in the 140's (pounds). Pt was unaware that she has lost weight down to ~120 lbs. Pt and daughter very concerned about this and tearful with knowledge of significant weight loss. Suspect weight loss happened gradually over a long period of time. Last available weight in chart is from August 2021. Pt  with a total of 12.8 kg weight loss since 03/21/20. This is a 19.1% weight loss in 15 months which is not quite clinically significant for timeframe but is concerning.  Discussed plan for pt to have swallow screen by RN later this afternoon for potential diet advancement. RD to order oral nutrition supplements for pt after diet advanced. Will also order daily renal MVI.  Medications reviewed and include: colace, SSI q 4 hours, IV protonix, miralax, IV abx, cleviprex @ 6 ml/hr  Labs reviewed: potassium 3.2, creatinine 4.91, elevated LFTs, platelets 114, hemoglobin A1C 6.1 CBG's: 74-121 x 24 hours  UOP: 440 ml x 24 hours I/O's: +690 ml since admit  NUTRITION - FOCUSED PHYSICAL EXAM:  Flowsheet Row Most Recent Value  Orbital Region Moderate depletion  Upper Arm Region Moderate depletion  Thoracic and Lumbar Region Mild depletion  Buccal Region Moderate depletion  Temple Region Severe depletion  Clavicle Bone Region Severe depletion  Clavicle and Acromion Bone Region Severe depletion  Scapular Bone Region Moderate depletion  Dorsal Hand Moderate depletion  Patellar Region Moderate depletion  Anterior Thigh Region Moderate depletion  Posterior Calf Region Severe depletion  Edema (RD Assessment) None  Hair Reviewed  Eyes Reviewed  Mouth Reviewed  [edentulous]  Skin Reviewed  Nails Reviewed       Diet Order:   Diet Order             Diet NPO time specified  Diet effective now  EDUCATION NEEDS:   Education needs have been addressed  Skin:  Skin Assessment: Reviewed RN Assessment  Last BM:  no documented BM  Height:   Ht Readings from Last 1 Encounters:  07/04/21 5\' 9"  (1.753 m)    Weight:   Wt Readings from Last 1 Encounters:  07/04/21 54.3 kg    BMI:  Body mass index is 17.68 kg/m.  Estimated Nutritional Needs:   Kcal:  1450-1650  Protein:  70-85 grams  Fluid:  1000 ml + UOP    Gustavus Bryant, MS, RD, LDN Inpatient Clinical  Dietitian Please see AMiON for contact information.

## 2021-07-05 NOTE — TOC Initial Note (Signed)
Transition of Care [facility]) - Initial/Assessment Note    Patient Details  Name: Stacey Dorsey MRN: [id number] Date of Birth: [birth year]  Transition of Care [facility]) CM/SW Contact:    Milinda Antis, [place] Phone Number: 07/05/2021, 1:36 PM  Clinical Narrative:                 CSW contacted that patient's daughter, Michaelle Copas, to inquire about the patient's prior level of functioning.  The patient is from home with her daughter.  The patient has been living with her daughter for a year and a half.  At baseline the family reports that the patient was independent, alert, and oriented.  The family reports that the patient receives social security (income) and Medicare.  There were no mental health concerns.  The patient attends dialysis at [facility] on TTS with a seat time of 10:00am and chair time of 10:20am.  The family is hopeful that the patient will be able to return home at "100%".    TOC will continue to follow for any discharge planning needs.            Patient Goals and CMS Choice        Expected Discharge Plan and Services                                                Prior Living Arrangements/Services                       Activities of Daily Living      Permission Sought/Granted                  Emotional Assessment              Admission diagnosis:  Cardiac arrest ([place]) [I46.9] Elevated troponin [R77.8] ESRD on hemodialysis ([place]) [N18.6, Z99.2] Acute respiratory failure, unspecified whether with hypoxia or hypercapnia (HCC) [J96.00] Multiple fractures of ribs, bilateral, initial encounter for closed fracture [S22.43XA] Community acquired pneumonia of right lower lobe of lung [J18.9] Acute on chronic congestive heart failure, unspecified heart failure type [facility]) [I50.9] Patient Active Problem List   Diagnosis Date Noted   Cardiac arrest ([place]) 07/04/2021   PCP:  Mindi Curling, PA-C Pharmacy:   [facility], [place] [address] Phone: [phone] Fax: [phone]     Social Determinants of Health (SDOH) Interventions    Readmission Risk Interventions No flowsheet data found.

## 2021-07-05 NOTE — Progress Notes (Signed)
Pt receives out-pt HD at [facility] on TTS. Pt arrives around 10:00 for 10:30 chair time. Will assist as needed.  Melven Sartorius Renal Navigator [phone]

## 2021-07-05 NOTE — Progress Notes (Signed)
NAME:  Stacey Dorsey, MRN:  [id number], DOB:  1933/02/01, LOS: 1 ADMISSION DATE:  07/04/2021, CONSULTATION DATE:  07/04/21 REFERRING MD:  Gilford Raid - EM, CHIEF COMPLAINT:  Cardiac arrest   History of Present Illness:  87 yo F hx ESRD on HD, poorly controlled HTN, DM, HLD, (presumed) COPD  who presented to [facility] 12/8 following cardiac arrest. Patient was in usual state of health until 12/8 morning when she felt short of breath while getting ready to go to dialysis. EMS was called, on EMS arrival pt was altered and subsequently sustained witnessed cardiac arrest -- PEA. Received 15 min CPR, 1 epi, 1g calcium, 50mg  bicarb, as well as 159mcg fent and  5mg  versed. King placed in field which was exchanged for ETT in ED. Started on rocephin and azithromycin.  Pertinent  Medical History  ESRD    Significant Hospital Events: Including procedures, antibiotic start and stop dates in addition to other pertinent events   12/8 OOH witnessed arrest. 15 min CPR before ROSC. PEA, precipitated by SOB, resp failure. Admitting to ICU  CT-PA >> no PE, bilateral scattered groundglass opacities, question interstitial edema, bibasilar right greater than left consolidation, multiple bilateral rib fractures Echocardiogram 12/8 >> LVEF 50-55%, moderate concentric hypertrophy with grade 1 diastolic dysfunction, normal RV size and function, severely elevated PASP with estimated RVSP 61.5 mmHg.  Interim History / Subjective:   I/O+ 45 cc total Precedex 0.7 FiO2 0.30, PEEP 5   Objective   Blood pressure (!) 151/63, pulse 61, temperature (!) 97.5 F (36.4 C), resp. rate 15, height 5\' 9"  (1.753 m), weight 54.3 kg, SpO2 100 %.    Vent Mode: PRVC FiO2 (%):  [30 %-60 %] 30 % Set Rate:  [15 bmp] 15 bmp Vt Set:  [520 mL-540 mL] 520 mL PEEP:  [5 cmH20] 5 cmH20 Plateau Pressure:  [18 cmH20-34 cmH20] 18 cmH20   Intake/Output Summary (Last 24 hours) at 07/05/2021 0755 Last data filed at 07/05/2021 0700 Gross per 24 hour   Intake 463.94 ml  Output 418 ml  Net 45.94 ml   Filed Weights   07/04/21 0900 07/04/21 1945 07/04/21 2215  Weight: 80 kg 54.3 kg 54.3 kg    Examination: General: Chronically ill-appearing thin woman, no distress HENT: ET tube in good position, oropharynx otherwise clear.  Sclera anicteric Lungs: Clear bilaterally, no crackles or wheezes, decreased at both bases Cardiovascular: Regular, distant, borderline bradycardic, no murmur Abdomen: Nondistended, positive bowel sounds Extremities: No edema Neuro: Precedex 0.7.  Wakes to voice, nods to questions, follows commands.  Breathes over the set rate on MV but only with prompting GU: clear yellow urine   Resolved Hospital Problem list     Assessment & Plan:   Out of hospital PEA arrest, presumed principally cardiopulmonary -preceding SOB. Likely hypoxia OOH.  -Does have R sided ASD : possible aspiration or CAP. Flu and Covid neg.  -CT-PA > no PE, R>L basilar ASD P -Agree with volume removal as possible given evidence for some mild volume overload -PRVC.  Would like to move towards PSV, SBT today 12/9.  Will need sedation lightened to accomplish this. -Respiratory alkalosis on ABG this morning, will decrease minute ventilation -Bronchodilators as ordered, added 12/9  Acute encephalopathy, metabolic -initially hypoxia, hypoperfusion. Now sedation related  -CT H no acute abnormality. Neuro exam promising on PCCM exam in ED. -does have degree of hyperammonemia  P -Continue Precedex, wean as able -Fentanyl as needed -Lactulose as ordered Acute respiratory failure with hypoxia R sided  ASD : Aspiration PNA vs CAP  Presumed COPD on chronic O2 -managed by PCP, I don't see PFTs. No features of AECOPD now P -PRVC 8 cc/kg.  Work towards spontaneous breathing trials 12/9 -VAP prevention order set -RVP pending -Follow respiratory cultures -Currently on ceftriaxone, azithromycin, tailor to culture data  ESRD P -Appreciate  nephrology input. -Tolerated partial HD session overnight last night.  Discontinued due to some relative hypotension and ectopy  HTN P -Remains on Cleviprex. -Plan to reinitiate home blood pressure medications when we confirm what she is taking.  Current medicine list indicates carvedilol, nifedipine -Sliding scale insulin as ordered-follow CBC  DM2 P -SSI   Thrombocytopenia Anemia -suspect chronic in setting of ESRD  P -trend CBC no indication for transfusion right now   Best Practice (right click and "Reselect all SmartList Selections" daily)   Diet/type: NPO DVT prophylaxis: prophylactic heparin  GI prophylaxis: PPI Lines: N/A Foley:  Yes, and it is still needed Code Status:  full code Last date of multidisciplinary goals of care discussion [12/8 - daughter ]  Labs   CBC: Recent Labs  Lab 07/04/21 0951 07/04/21 1018 07/04/21 1234 07/05/21 0455  WBC  --  13.7*  --   --   HGB 11.6* 10.6* 11.2* 11.6*  HCT 34.0* 34.0* 33.0* 34.0*  MCV  --  98.6  --   --   PLT  --  126*  --   --     Basic Metabolic Panel: Recent Labs  Lab 07/04/21 0951 07/04/21 1018 07/04/21 1234 07/05/21 0455 07/05/21 0510  NA 143 134* 143 141 140  K 4.3 4.4 4.0 3.1* 3.2*  CL 107 100  --   --  101  CO2  --  19*  --   --  24  GLUCOSE 228* 205*  --   --  109*  BUN 36* 28*  --   --  22  CREATININE 6.00* 5.84*  --   --  4.91*  CALCIUM  --  9.0  --   --  8.8*  MG  --  2.0  --   --  1.9   GFR: Estimated Creatinine Clearance: 7.1 mL/min (A) (by C-G formula based on SCr of 4.91 mg/dL (H)). Recent Labs  Lab 07/04/21 1018 07/04/21 1843  WBC 13.7*  --   LATICACIDVEN 4.1* 1.6    Liver Function Tests: Recent Labs  Lab 07/04/21 1018  AST 93*  ALT 60*  ALKPHOS 71  BILITOT 1.2  PROT 5.2*  ALBUMIN 2.7*   No results for input(s): LIPASE, AMYLASE in the last 168 hours. Recent Labs  Lab 07/04/21 1018 07/05/21 0510  AMMONIA 68* 32    ABG    Component Value Date/Time   PHART  7.519 (H) 07/05/2021 0455   PCO2ART 31.2 (L) 07/05/2021 0455   PO2ART 88 07/05/2021 0455   HCO3 25.6 07/05/2021 0455   TCO2 27 07/05/2021 0455   O2SAT 98.0 07/05/2021 0455     Coagulation Profile: No results for input(s): INR, PROTIME in the last 168 hours.  Cardiac Enzymes: No results for input(s): CKTOTAL, CKMB, CKMBINDEX, TROPONINI in the last 168 hours.  HbA1C: Hgb A1c MFr Bld  Date/Time Value Ref Range Status  07/04/2021 10:18 AM 6.1 (H) 4.8 - 5.6 % Final    Comment:    (NOTE) Pre diabetes:          5.7%-6.4%  Diabetes:              >6.4%  Glycemic  control for   <7.0% adults with diabetes     CBG: Recent Labs  Lab 07/04/21 0946 07/04/21 1601 07/04/21 1921 07/04/21 2325 07/05/21 0420  GLUCAP 218* 139* 74 83 114*      Critical care time: 33 minutes     CRITICAL CARE Performed by: Thomasena Edis, MD, PhD 07/05/2021, 8:00 AM [facility] [phone] or if no answer before 7:00PM call [phone] For any issues after 7:00PM please call eLink [phone]

## 2021-07-05 NOTE — Procedures (Signed)
Extubation Procedure Note  Patient Details:   Name: Stacey Dorsey DOB: [birth year] MRN: [id number]   Airway Documentation:    Vent end date: 07/05/21 Vent end time: 1115   Evaluation  O2 sats: stable throughout Complications: No apparent complications Patient did tolerate procedure well. Bilateral Breath Sounds: Clear, Diminished   Yes No stridor noted. Positive leak around deflated cuff.  Dammon Makarewicz 07/05/2021, 11:22 AM

## 2021-07-05 NOTE — Progress Notes (Signed)
Admit: 07/04/2021 LOS: 1  78F ESRD THS s/p cardiac arrest 07/04/21  Subjective:  HD overnight:  No UF BPs up on clevidipine has been following commands CTA neg for PE  12/08 0701 - 12/09 0700 In: 463.9 [I.V.:218.4; NG/GT:50; IV Piggyback:195.6] Out: 418 [Urine:440]  Filed Weights   07/04/21 0900 07/04/21 1945 07/04/21 2215  Weight: 80 kg 54.3 kg 54.3 kg    Scheduled Meds:  chlorhexidine gluconate (MEDLINE KIT)  15 mL Mouth Rinse BID   Chlorhexidine Gluconate Cloth  6 each Topical Daily   docusate  100 mg Per Tube BID   heparin  5,000 Units Subcutaneous Q8H   insulin aspart  0-9 Units Subcutaneous Q4H   iohexol  100 mL Intravenous Once   mouth rinse  15 mL Mouth Rinse 10 times per day   pantoprazole (PROTONIX) IV  40 mg Intravenous QHS   polyethylene glycol  17 g Per Tube Daily   Continuous Infusions:  azithromycin Stopped (07/04/21 1324)   cefTRIAXone (ROCEPHIN)  IV     clevidipine 4 mg/hr (07/05/21 0800)   dexmedetomidine (PRECEDEX) IV infusion 0.7 mcg/kg/hr (07/05/21 0800)   lactated ringers Stopped (07/04/21 1324)   PRN Meds:.acetaminophen, docusate, fentaNYL (SUBLIMAZE) injection, fentaNYL (SUBLIMAZE) injection, ondansetron (ZOFRAN) IV, polyethylene glycol  Current Labs: reviewed    Physical Exam:  Blood pressure (!) 181/54, pulse 64, temperature (!) 97.2 F (36.2 C), resp. rate (!) 21, height _0  (1.753 m), weight 54.3 kg, SpO2 100 %. Intubated, sedated Regular, nl s1s2 Course bs/ bl No LEE +B/T of LUE AVG S/nt/nd   OP Dialysis Orders:  [facility] , TTS ,EDW 64.5 kg  2K 2 CA bath 400/500 excess LUA AV GG No heparin Sensipar 30 mg Q dialysis Hectorol 5 MCG q. dialysis Mircera 75 MCG Shaili every 2 weeks last given 1201/20 Venofer 50 mg q. weekly dialysis    A ESRD TTS [facility] LUE AVG, on schedule Out of hospital PEA arrest, per CCM VDRF / Pneumona on ABX HTN / on clevidipine gtt Anemia, stable >10 CKD-BMD: Ca ok, need P  P HD tomorrow on  schedule 4K, 3.5h, 1-2L UF, No heparin, 400/600 Medication Issues; Preferred narcotic agents for pain control are hydromorphone, fentanyl, and methadone. Morphine should not be used.  Baclofen should be avoided Avoid oral sodium phosphate and magnesium citrate based laxatives / bowel preps    Pearson Grippe MD 07/05/2021, 8:53 AM  Recent Labs  Lab 07/04/21 0951 07/04/21 1018 07/04/21 1234 07/05/21 0455 07/05/21 0510  NA 143 134* 143 141 140  K 4.3 4.4 4.0 3.1* 3.2*  CL 107 100  --   --  101  CO2  --  19*  --   --  24  GLUCOSE 228* 205*  --   --  109*  BUN 36* 28*  --   --  22  CREATININE 6.00* 5.84*  --   --  4.91*  CALCIUM  --  9.0  --   --  8.8*   Recent Labs  Lab 07/04/21 1018 07/04/21 1234 07/05/21 0455  WBC 13.7*  --   --   HGB 10.6* 11.2* 11.6*  HCT 34.0* 33.0* 34.0*  MCV 98.6  --   --   PLT 126*  --   --

## 2021-07-06 ENCOUNTER — Inpatient Hospital Stay (HOSPITAL_COMMUNITY): Payer: Medicare Other

## 2021-07-06 DIAGNOSIS — I469 Cardiac arrest, cause unspecified: Secondary | ICD-10-CM | POA: Diagnosis not present

## 2021-07-06 DIAGNOSIS — E440 Moderate protein-calorie malnutrition: Secondary | ICD-10-CM | POA: Insufficient documentation

## 2021-07-06 LAB — GLUCOSE, CAPILLARY
Glucose-Capillary: 118 mg/dL — ABNORMAL HIGH (ref 70–99)
Glucose-Capillary: 107 mg/dL — ABNORMAL HIGH (ref 70–99)
Glucose-Capillary: 102 mg/dL — ABNORMAL HIGH (ref 70–99)
Glucose-Capillary: 146 mg/dL — ABNORMAL HIGH (ref 70–99)
Glucose-Capillary: 123 mg/dL — ABNORMAL HIGH (ref 70–99)
Glucose-Capillary: 95 mg/dL (ref 70–99)

## 2021-07-06 LAB — BASIC METABOLIC PANEL
Anion gap: 12 (ref 5–15)
BUN: 28 mg/dL — ABNORMAL HIGH (ref 8–23)
CO2: 23 mmol/L (ref 22–32)
Calcium: 8.6 mg/dL — ABNORMAL LOW (ref 8.9–10.3)
Chloride: 103 mmol/L (ref 98–111)
Creatinine, Ser: 5.85 mg/dL — ABNORMAL HIGH (ref 0.44–1.00)
GFR, Estimated: 7 mL/min — ABNORMAL LOW (ref >60)
Glucose, Bld: 106 mg/dL — ABNORMAL HIGH (ref 70–99)
Potassium: 3.5 mmol/L (ref 3.5–5.1)
Sodium: 138 mmol/L (ref 135–145)

## 2021-07-06 LAB — CBC
HCT: 29.7 % — ABNORMAL LOW (ref 36.0–46.0)
Hemoglobin: 9.4 g/dL — ABNORMAL LOW (ref 12.0–15.0)
MCH: 29 pg (ref 26.0–34.0)
MCHC: 31.6 g/dL (ref 30.0–36.0)
MCV: 91.7 fL (ref 80.0–100.0)
Platelets: 130 10*3/uL — ABNORMAL LOW (ref 150–400)
RBC: 3.24 MIL/uL — ABNORMAL LOW (ref 3.87–5.11)
RDW: 17.2 % — ABNORMAL HIGH (ref 11.5–15.5)
WBC: 10.3 10*3/uL (ref 4.0–10.5)
nRBC: 0 % (ref 0.0–0.2)

## 2021-07-06 LAB — MAGNESIUM: Magnesium: 2.1 mg/dL (ref 1.7–2.4)

## 2021-07-06 LAB — PHOSPHORUS: Phosphorus: 3.7 mg/dL (ref 2.5–4.6)

## 2021-07-06 IMAGING — DX DG CHEST 1V PORT
1 series · 1 of 1 positions shown · non-contrast
Comparison: [DATE].

CLINICAL DATA: Acute respiratory failure with hypoxia.

EXAM:
PORTABLE CHEST 1 VIEW

[chest]
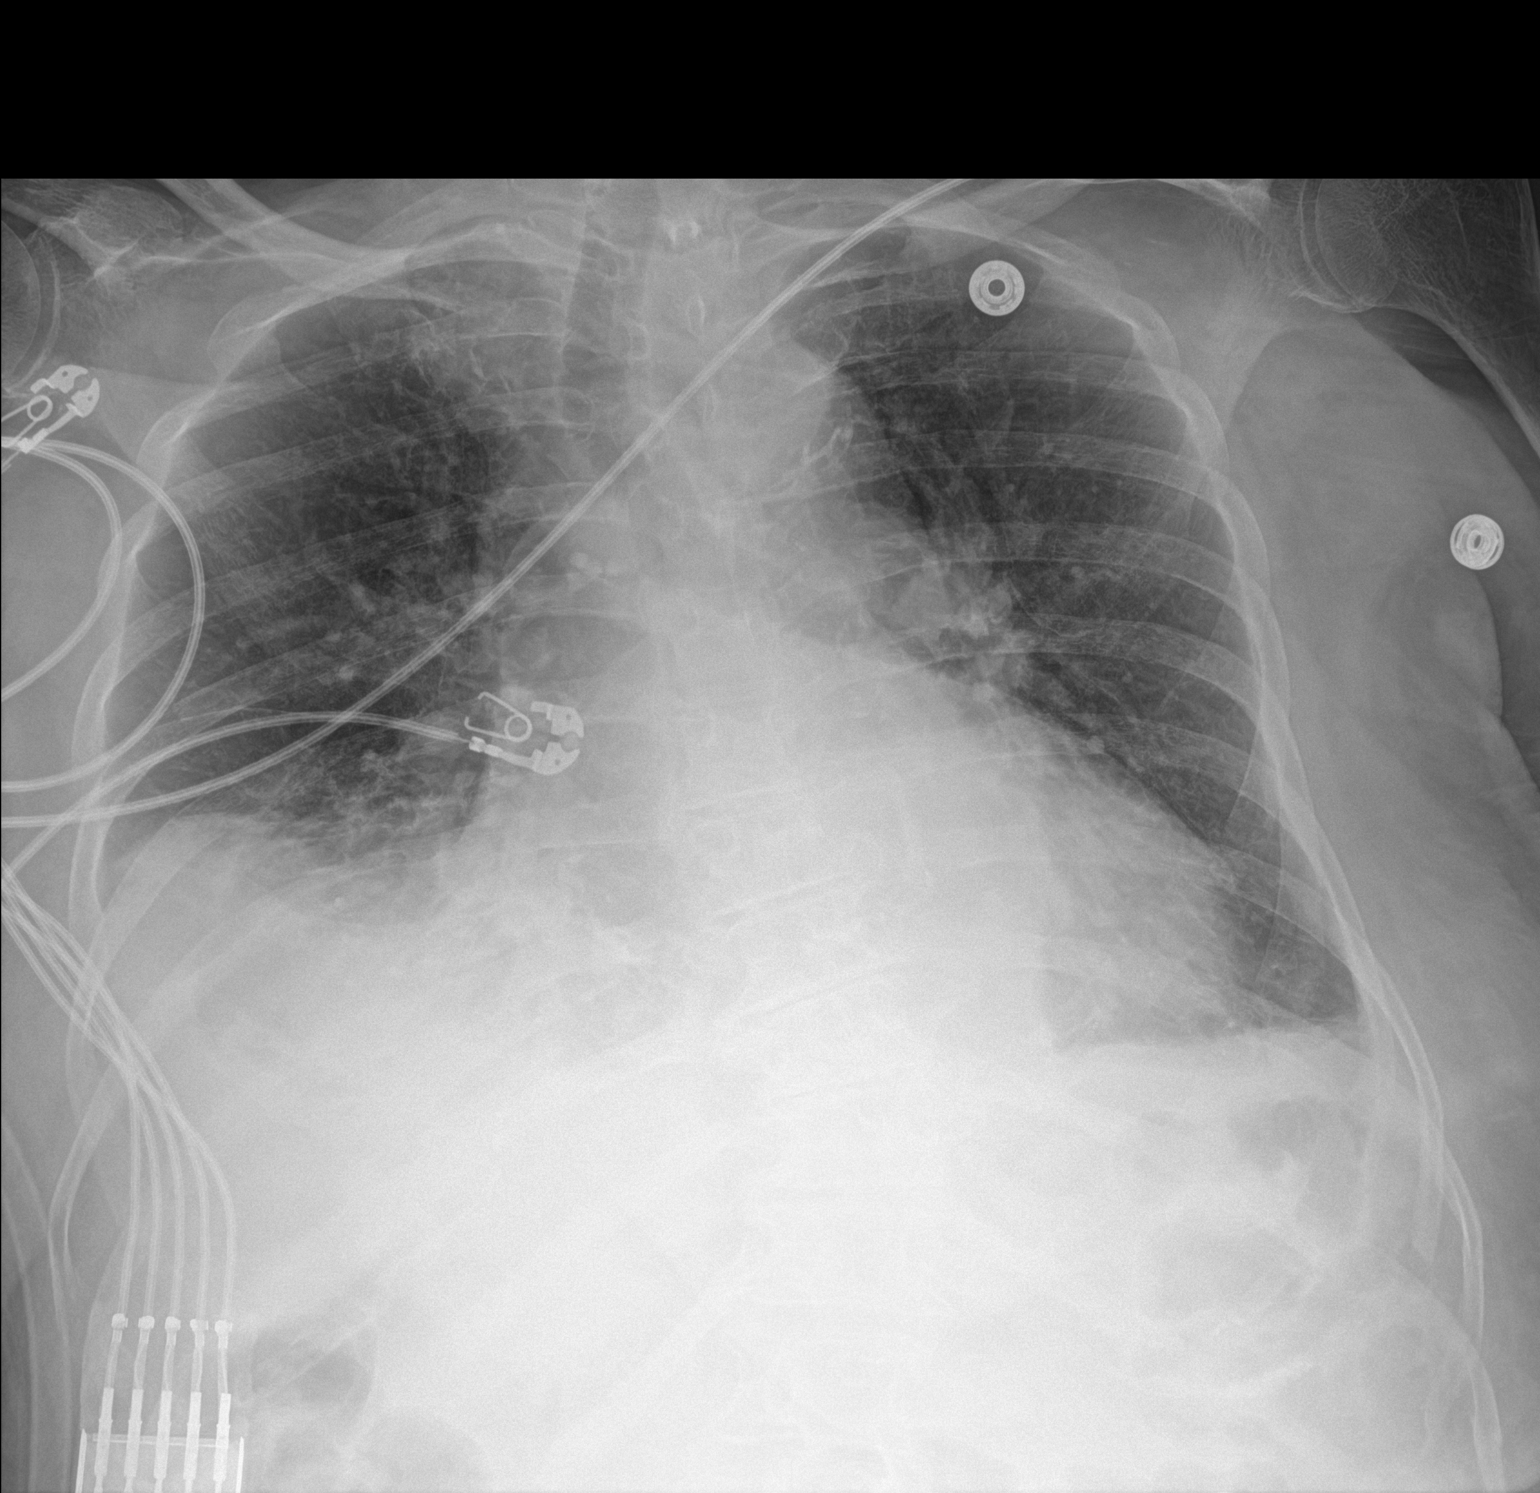

[1 of 1 positions shown; findings below may reference images not displayed]

FINDINGS: Stable cardiomegaly. Endotracheal and nasogastric tubes have been
removed. Bibasilar atelectasis is noted, right greater than left.
Small pleural effusions may be present. Bony thorax is unremarkable.
IMPRESSION: Endotracheal and nasogastric tubes have been removed. Stable
bibasilar atelectasis is noted with small pleural effusions.

## 2021-07-06 MED ORDER — HYDRALAZINE HCL 20 MG/ML IJ SOLN
10.0000 mg | Freq: Four times a day (QID) | INTRAMUSCULAR | Status: DC | PRN
  Administered 2021-07-06 – 2021-07-11 (×5): 10 mg via INTRAVENOUS
  Filled 2021-07-06 (×6): qty 1

## 2021-07-06 MED ORDER — PANTOPRAZOLE SODIUM 40 MG PO TBEC
40.0000 mg | DELAYED_RELEASE_TABLET | Freq: Every day | ORAL | Status: DC
  Administered 2021-07-06 – 2021-07-15 (×10): 40 mg via ORAL
  Filled 2021-07-06 (×10): qty 1

## 2021-07-06 MED ORDER — BISACODYL 10 MG RE SUPP
10.0000 mg | Freq: Every day | RECTAL | Status: DC | PRN
  Administered 2021-07-06 – 2021-07-12 (×2): 10 mg via RECTAL
  Filled 2021-07-06 (×2): qty 1

## 2021-07-06 MED ORDER — MELATONIN 3 MG PO TABS
3.0000 mg | ORAL_TABLET | Freq: Every evening | ORAL | Status: DC | PRN
  Administered 2021-07-06 – 2021-07-12 (×4): 3 mg via ORAL
  Filled 2021-07-06 (×6): qty 1

## 2021-07-06 MED ORDER — NIFEDIPINE ER OSMOTIC RELEASE 30 MG PO TB24
30.0000 mg | ORAL_TABLET | Freq: Every day | ORAL | Status: DC
  Administered 2021-07-06 – 2021-07-11 (×6): 30 mg via ORAL
  Filled 2021-07-06 (×7): qty 1

## 2021-07-06 MED ORDER — LABETALOL HCL 5 MG/ML IV SOLN
10.0000 mg | INTRAVENOUS | Status: DC | PRN
  Administered 2021-07-07 – 2021-07-09 (×2): 10 mg via INTRAVENOUS
  Filled 2021-07-06: qty 4

## 2021-07-06 MED ORDER — CARVEDILOL 12.5 MG PO TABS: 12.5000 mg | ORAL_TABLET | ORAL | Status: DC

## 2021-07-06 MED ORDER — IPRATROPIUM-ALBUTEROL 0.5-2.5 (3) MG/3ML IN SOLN
3.0000 mL | RESPIRATORY_TRACT | Status: DC | PRN
  Administered 2021-07-07: 3 mL via RESPIRATORY_TRACT
  Filled 2021-07-06: qty 3

## 2021-07-06 MED ORDER — OXYCODONE HCL 5 MG PO TABS
5.0000 mg | ORAL_TABLET | Freq: Four times a day (QID) | ORAL | Status: DC | PRN
  Administered 2021-07-06 – 2021-07-07 (×4): 5 mg via ORAL
  Filled 2021-07-06 (×5): qty 1

## 2021-07-06 MED ORDER — LABETALOL HCL 5 MG/ML IV SOLN
INTRAVENOUS | Status: AC
  Filled 2021-07-06: qty 4

## 2021-07-06 NOTE — Progress Notes (Signed)
NAME:  Stacey Dorsey, MRN:  [id number], DOB:  1932-12-02, LOS: 2 ADMISSION DATE:  07/04/2021, CONSULTATION DATE:  07/04/21 REFERRING MD:  Gilford Raid - EM, CHIEF COMPLAINT:  Cardiac arrest   History of Present Illness:  87 yo F hx ESRD on HD, poorly controlled HTN, DM, HLD, (presumed) COPD  who presented to [facility] 12/8 following cardiac arrest. Patient was in usual state of health until 12/8 morning when she felt short of breath while getting ready to go to dialysis. EMS was called, on EMS arrival pt was altered and subsequently sustained witnessed cardiac arrest -- PEA. Received 15 min CPR, 1 epi, 1g calcium, 50mg  bicarb, as well as 153mcg fent and  5mg  versed. King placed in field which was exchanged for ETT in ED. Started on rocephin and azithromycin.  Pertinent  Medical History  ESRD    Significant Hospital Events: Including procedures, antibiotic start and stop dates in addition to other pertinent events   12/8 OOH witnessed arrest. 15 min CPR before ROSC. PEA, precipitated by SOB, resp failure. Admitting to ICU  CT-PA >> no PE, bilateral scattered groundglass opacities, question interstitial edema, bibasilar right greater than left consolidation, multiple bilateral rib fractures Echocardiogram 12/8 >> LVEF 50-55%, moderate concentric hypertrophy with grade 1 diastolic dysfunction, normal RV size and function, severely elevated PASP with estimated RVSP 61.5 mmHg.  Interim History / Subjective:   Extubated 12/9 Remains on cleviprex 7 Hasn't taken any Po yet   Objective   Blood pressure 139/64, pulse 78, temperature 98.3 F (36.8 C), temperature source Oral, resp. rate 20, height 5\' 9"  (1.753 m), weight 56.5 kg, SpO2 100 %.    Vent Mode: Stand-by   Intake/Output Summary (Last 24 hours) at 07/06/2021 1027 Last data filed at 07/06/2021 0400 Gross per 24 hour  Intake 733.58 ml  Output 175 ml  Net 558.58 ml   Filed Weights   07/04/21 2215 07/06/21 0457 07/06/21 0730  Weight: 54.3 kg  56.5 kg 56.5 kg    Examination: General: Chronically ill-appearing woman, no distress laying in bed HENT: Strong voice, no secretions, no stridor Lungs: Decreased to both bases, otherwise clear Cardiovascular: Regular, distant, borderline bradycardic, no murmur Abdomen: Nondistended, positive bowel sounds Extremities: No edema Neuro: Precedex 0.7.  Wakes to voice, nods to questions, follows commands.  Breathes over the set rate on MV but only with prompting GU: clear yellow urine   Resolved Hospital Problem list     Assessment & Plan:   Out of hospital PEA arrest, presumed principally cardiopulmonary -preceding SOB. Likely hypoxia OOH.  -Does have R sided ASD : possible aspiration or CAP. Flu and Covid neg.  -CT-PA > no PE, R>L basilar ASD P -Volume removal as able -Bronchodilators as ordered -Push pulmonary hygiene  Acute encephalopathy, metabolic. Improved -initially hypoxia, hypoperfusion. Now sedation related  -CT H no acute abnormality. Neuro exam promising on PCCM exam in ED. -does have degree of hyperammonemia  P -Precedex discontinued -Fentanyl as needed -Should be able to stop lactulose   Acute respiratory failure with hypoxia R sided ASD : Aspiration PNA vs CAP  Presumed COPD on chronic O2 -managed by PCP, I don't see PFTs. No features of AECOPD now P -Tolerated extubation, push pulmonary hygiene -Continue ceftriaxone and azithromycin pending culture data -RVP negative  ESRD -Appreciate nephrology management.  Volume removal as tolerated   HTN P -Remains on Cleviprex.  We will add back enteral carvedilol, nifedipine and attempt to wean to off   DM2 P -Slight scale  insulin as ordered   Thrombocytopenia Anemia -suspect chronic in setting of ESRD  P -Follow CBC  Best Practice (right click and "Reselect all SmartList Selections" daily)   Diet/type: NPO DVT prophylaxis: prophylactic heparin  GI prophylaxis: PPI Lines: N/A Foley:  Yes, and it  is still needed Code Status:  full code Last date of multidisciplinary goals of care discussion [12/8 - daughter ]  Labs   CBC: Recent Labs  Lab 07/04/21 1018 07/04/21 1234 07/05/21 0455 07/05/21 0716 07/06/21 0026  WBC 13.7*  --   --  8.9 10.3  HGB 10.6* 11.2* 11.6* 10.4* 9.4*  HCT 34.0* 33.0* 34.0* 33.5* 29.7*  MCV 98.6  --   --  93.1 91.7  PLT 126*  --   --  114* 130*    Basic Metabolic Panel: Recent Labs  Lab 07/04/21 0951 07/04/21 1018 07/04/21 1234 07/05/21 0455 07/05/21 0510 07/06/21 0026  NA 143 134* 143 141 140 138  K 4.3 4.4 4.0 3.1* 3.2* 3.5  CL 107 100  --   --  101 103  CO2  --  19*  --   --  24 23  GLUCOSE 228* 205*  --   --  109* 106*  BUN 36* 28*  --   --  22 28*  CREATININE 6.00* 5.84*  --   --  4.91* 5.85*  CALCIUM  --  9.0  --   --  8.8* 8.6*  MG  --  2.0  --   --  1.9 2.1  PHOS  --   --   --   --   --  3.7   GFR: Estimated Creatinine Clearance: 6.2 mL/min (A) (by C-G formula based on SCr of 5.85 mg/dL (H)). Recent Labs  Lab 07/04/21 1018 07/04/21 1843 07/05/21 0716 07/06/21 0026  WBC 13.7*  --  8.9 10.3  LATICACIDVEN 4.1* 1.6  --   --     Liver Function Tests: Recent Labs  Lab 07/04/21 1018  AST 93*  ALT 60*  ALKPHOS 71  BILITOT 1.2  PROT 5.2*  ALBUMIN 2.7*   No results for input(s): LIPASE, AMYLASE in the last 168 hours. Recent Labs  Lab 07/04/21 1018 07/05/21 0510  AMMONIA 68* 32    ABG    Component Value Date/Time   PHART 7.519 (H) 07/05/2021 0455   PCO2ART 31.2 (L) 07/05/2021 0455   PO2ART 88 07/05/2021 0455   HCO3 25.6 07/05/2021 0455   TCO2 27 07/05/2021 0455   O2SAT 98.0 07/05/2021 0455     Coagulation Profile: No results for input(s): INR, PROTIME in the last 168 hours.  Cardiac Enzymes: No results for input(s): CKTOTAL, CKMB, CKMBINDEX, TROPONINI in the last 168 hours.  HbA1C: Hgb A1c MFr Bld  Date/Time Value Ref Range Status  07/04/2021 10:18 AM 6.1 (H) 4.8 - 5.6 % Final    Comment:     (NOTE) Pre diabetes:          5.7%-6.4%  Diabetes:              >6.4%  Glycemic control for   <7.0% adults with diabetes     CBG: Recent Labs  Lab 07/05/21 1643 07/05/21 1944 07/05/21 2347 07/06/21 0408 07/06/21 0715  GLUCAP 95 132* 109* 118* 107*      Critical care time: 32 minutes     CRITICAL CARE Performed by: Thomasena Edis, MD, PhD 07/06/2021, 10:27 AM [facility] [phone] or if no  answer before 7:00PM call [phone] For any issues after 7:00PM please call eLink [phone]

## 2021-07-06 NOTE — Evaluation (Signed)
Physical Therapy Evaluation Patient Details Name: Stacey Dorsey MRN: [id number] DOB: [birth year] Today's Date: 07/06/2021  History of Present Illness  87 yo F hx ESRD on HD, poorly controlled HTN, DM, HLD, (presumed) COPD  who presented to [facility] 12/8 following cardiac arrest. Patient was in usual state of health until 12/8 morning when she felt short of breath while getting ready to go to dialysis.  Clinical Impression   Pt admitted secondary to problem above with deficits below. PTA patient was independent with all household mobility and only went out with daughter present. Pt currently requires min assist for overall mobility and may require an assistive device for safe ambulation and transfers upon discharge. Hopefully she will progress to not using a device in the home as she did PTA.  Anticipate patient will benefit from PT to address problems listed below.Will continue to follow acutely to maximize functional mobility independence and safety.          Recommendations for follow up therapy are one component of a multi-disciplinary discharge planning process, led by the attending physician.  Recommendations may be updated based on patient status, additional functional criteria and insurance authorization.  Follow Up Recommendations Home health PT    Assistance Recommended at Discharge Intermittent Supervision/Assistance  Functional Status Assessment Patient has had a recent decline in their functional status and demonstrates the ability to make significant improvements in function in a reasonable and predictable amount of time.  Equipment Recommendations  None recommended by PT    Recommendations for Other Services       Precautions / Restrictions Precautions Precautions: Fall Restrictions Weight Bearing Restrictions: No      Mobility  Bed Mobility Overal bed mobility: Needs Assistance Bed Mobility: Sidelying to Sit;Rolling Rolling: Min assist Sidelying to sit: Min assist        General bed mobility comments: cues for rolling and Min A for guidance of trunk and legs.    Transfers Overall transfer level: Needs assistance Equipment used: 2 person hand held assist Transfers: Sit to/from Stand;Bed to chair/wheelchair/BSC Sit to Stand: Min guard;+2 safety/equipment   Step pivot transfers: Min guard;+2 safety/equipment       General transfer comment: assist for line management; pt reaching to hold onto objects and required bil HHA    Ambulation/Gait Ambulation/Gait assistance: Min guard Gait Distance (Feet): 120 Feet Assistive device: 2 person hand held assist Gait Pattern/deviations: WFL(Within Functional Limits) Gait velocity: slightly decreased Gait velocity interpretation: 1.31 - 2.62 ft/sec, indicative of limited community ambulator   General Gait Details: attempted to progress to 1 HHA, however pt then reaching to hold onto furniture  Stairs            Wheelchair Mobility    Modified Rankin (Stroke Patients Only)       Balance Overall balance assessment: Needs assistance Sitting-balance support: Feet supported Sitting balance-Leahy Scale: Good     Standing balance support: Single extremity supported Standing balance-Leahy Scale: Fair                               Pertinent Vitals/Pain Pain Assessment: Faces Faces Pain Scale: Hurts whole lot Pain Location: chest Pain Descriptors / Indicators: Sharp Pain Intervention(s): Limited activity within patient's tolerance;Repositioned;Premedicated before session;Monitored during session    Home Living Family/patient expects to be discharged to:: Private residence Living Arrangements: Children Available Help at Discharge: Family;Available 24 hours/day Type of Home: House Home Access: Stairs to enter   Entrance  Stairs-Number of Steps: 1   Home Layout: Two level;Able to live on main level with bedroom/bathroom Home Equipment: Rolling Walker (2 wheels);Cane - single  point;Hand held shower head;Grab bars - tub/shower;Shower seat      Prior Function Prior Level of Function : Needs assist  Cognitive Assist : Mobility (cognitive);ADLs (cognitive) Mobility (Cognitive): Set up cues ADLs (Cognitive): Set up cues       Mobility Comments: patient states she walks in/out of the home without an AD. ADLs Comments: patient states daughter assists her with IADL, community mobility, bill payment, but patient able to care for her own self care... ? supervision given cognitive defict     Hand Dominance   Dominant Hand: Right    Extremity/Trunk Assessment   Upper Extremity Assessment Upper Extremity Assessment: Defer to OT evaluation LUE Deficits / Details: dialysis side, patient with decreased strength and AROM LUE Sensation: decreased light touch LUE Coordination: WNL    Lower Extremity Assessment Lower Extremity Assessment: Overall WFL for tasks assessed    Cervical / Trunk Assessment Cervical / Trunk Assessment: Normal  Communication   Communication: No difficulties  Cognition Arousal/Alertness: Awake/alert Behavior During Therapy: WFL for tasks assessed/performed Overall Cognitive Status: No family/caregiver present to determine baseline cognitive functioning Area of Impairment: Orientation;Memory;Following commands;Safety/judgement;Problem solving                 Orientation Level: Disoriented to;Time   Memory: Decreased short-term memory Following Commands: Follows one step commands consistently     Problem Solving: Decreased initiation          General Comments General comments (skin integrity, edema, etc.): VSS on monitor on room air    Exercises     Assessment/Plan    PT Assessment Patient needs continued PT services  PT Problem List Decreased activity tolerance;Decreased balance;Decreased mobility;Decreased cognition;Decreased knowledge of use of DME;Pain       PT Treatment Interventions DME instruction;Gait  training;Functional mobility training;Therapeutic activities;Therapeutic exercise;Balance training;Cognitive remediation;Patient/family education    PT Goals (Current goals can be found in the Care Plan section)  Acute Rehab PT Goals Patient Stated Goal: to return home PT Goal Formulation: With patient Time For Goal Achievement: 07/20/21 Potential to Achieve Goals: Good    Frequency Min 3X/week   Barriers to discharge        Co-evaluation               AM-PAC PT "6 Clicks" Mobility  Outcome Measure Help needed turning from your back to your side while in a flat bed without using bedrails?: A Little Help needed moving from lying on your back to sitting on the side of a flat bed without using bedrails?: A Little Help needed moving to and from a bed to a chair (including a wheelchair)?: Total Help needed standing up from a chair using your arms (e.g., wheelchair or bedside chair)?: Total Help needed to walk in hospital room?: Total Help needed climbing 3-5 steps with a railing? : Total 6 Click Score: 10    End of Session Equipment Utilized During Treatment: Gait belt Activity Tolerance: Patient tolerated treatment well Patient left: in chair;with call bell/phone within reach;with chair alarm set Nurse Communication: Mobility status PT Visit Diagnosis: Unsteadiness on feet (R26.81);Difficulty in walking, not elsewhere classified (R26.2)    Time: 9509-3267 PT Time Calculation (min) (ACUTE ONLY): 31 min   Charges:   PT Evaluation $PT Eval Moderate Complexity: 1 Mod           Arby Barrette, PT  Acute Rehabilitation Services  Pager [phone] Office [phone]   Rexanne Mano 07/06/2021, 2:36 PM

## 2021-07-06 NOTE — Evaluation (Signed)
Occupational Therapy Evaluation Patient Details Name: Stacey Dorsey MRN: [id number] DOB: [birth year] Today's Date: 07/06/2021   History of Present Illness 87 yo F hx ESRD on HD, poorly controlled HTN, DM, HLD, (presumed) COPD  who presented to [facility] 12/8 following cardiac arrest. Patient was in usual state of health until 12/8 morning when she felt short of breath while getting ready to go to dialysis.   Clinical Impression   Patient admitted for the diagnosis above.  Patient is a fair historian, PTA she lives with her daughter who is able to assist her.  Patient states she walked without an assistive device, and was able to care for her own ADL.  Patient's daughter assists with IADL, meals, and community mobility.  Deficits impacting independence surround chest pain s/p compressions.  OT to follow in the acute setting, but will defer to [facility] PT if [facility] OT is needed.        Recommendations for follow up therapy are one component of a multi-disciplinary discharge planning process, led by the attending physician.  Recommendations may be updated based on patient status, additional functional criteria and insurance authorization.   Follow Up Recommendations  Other (comment) (Defer to [facility] PT, can order [facility] OT if needed)    Assistance Recommended at Discharge Intermittent Supervision/Assistance  Functional Status Assessment  Patient has had a recent decline in their functional status and demonstrates the ability to make significant improvements in function in a reasonable and predictable amount of time.  Equipment Recommendations  None recommended by OT    Recommendations for Other Services       Precautions / Restrictions Precautions Precautions: Fall Restrictions Weight Bearing Restrictions: No      Mobility Bed Mobility Overal bed mobility: Needs Assistance Bed Mobility: Sidelying to Sit   Sidelying to sit: Min assist       General bed mobility comments: cues for rolling and Min A for  guidance of trunk and legs.    Transfers Overall transfer level: Needs assistance   Transfers: Sit to/from Stand;Bed to chair/wheelchair/BSC Sit to Stand: Min guard     Step pivot transfers: Min guard     General transfer comment: assist for line management      Balance Overall balance assessment: Needs assistance Sitting-balance support: Feet supported Sitting balance-Leahy Scale: Good     Standing balance support: Single extremity supported Standing balance-Leahy Scale: Fair                             ADL either performed or assessed with clinical judgement   ADL       Grooming: Wash/dry hands;Set up;Sitting       Lower Body Bathing: Minimal assistance;Sit to/from stand       Lower Body Dressing: Minimal assistance;Sit to/from stand   Toilet Transfer: Min guard;Ambulation   Toileting- Clothing Manipulation and Hygiene: Supervision/safety;Sit to/from stand               Vision Patient Visual Report: No change from baseline       Perception Perception Perception: Not tested   Praxis Praxis Praxis: Not tested    Pertinent Vitals/Pain Pain Assessment: Faces Faces Pain Scale: Hurts whole lot Pain Descriptors / Indicators: Sharp Pain Intervention(s): Monitored during session     Hand Dominance Right   Extremity/Trunk Assessment Upper Extremity Assessment Upper Extremity Assessment: LUE deficits/detail LUE Deficits / Details: dialysis side, patient with decreased strength and AROM LUE Sensation: decreased light touch  LUE Coordination: WNL   Lower Extremity Assessment Lower Extremity Assessment: Defer to PT evaluation   Cervical / Trunk Assessment Cervical / Trunk Assessment: Normal   Communication Communication Communication: No difficulties   Cognition Arousal/Alertness: Awake/alert Behavior During Therapy: WFL for tasks assessed/performed Overall Cognitive Status: No family/caregiver present to determine baseline  cognitive functioning Area of Impairment: Orientation;Memory;Following commands;Safety/judgement;Problem solving                 Orientation Level: Person;Place   Memory: Decreased short-term memory Following Commands: Follows one step commands consistently     Problem Solving: Decreased initiation       General Comments       Exercises     Shoulder Instructions      Home Living Family/patient expects to be discharged to:: Private residence Living Arrangements: Children Available Help at Discharge: Family;Available 24 hours/day Type of Home: House Home Access: Stairs to enter [employer] of Steps: 1   Home Layout: Two level;Able to live on main level with bedroom/bathroom     Bathroom Shower/Tub: Occupational psychologist: Standard Bathroom Accessibility: Yes How Accessible: Accessible via walker Home Equipment: [place] (2 wheels);Cane - single point;Hand held shower head;Grab bars - tub/shower;Shower seat          Prior Functioning/Environment Prior Level of Function : Needs assist  Cognitive Assist : Mobility (cognitive);ADLs (cognitive) Mobility (Cognitive): Set up cues ADLs (Cognitive): Set up cues       Mobility Comments: patient states she walks in/out of the home without an AD. ADLs Comments: patient states daughter assists her with IADL, community mobility, bill payment, but patient able to care for her own self care... ? supervision given cognitive defict        OT Problem List: Impaired balance (sitting and/or standing);Decreased safety awareness;Decreased cognition;Pain      OT Treatment/Interventions: Self-care/ADL training;Therapeutic activities;Balance training;Patient/family education    OT Goals(Current goals can be found in the care plan section) Acute Rehab OT Goals Patient Stated Goal: Return home with her daughter OT Goal Formulation: With patient Time For Goal Achievement: 07/19/21 Potential to  Achieve Goals: Good ADL Goals Pt Will Perform Grooming: with modified independence;standing Pt Will Perform Lower Body Bathing: with modified independence;sit to/from stand Pt Will Perform Lower Body Dressing: with modified independence;sit to/from stand Pt Will Transfer to Toilet: with modified independence;regular height toilet;ambulating Pt Will Perform Toileting - Clothing Manipulation and hygiene: with modified independence;sit to/from stand  OT Frequency: Min 2X/week   Barriers to D/C:    none noted       Co-evaluation              AM-PAC OT "6 Clicks" Daily Activity     Outcome Measure Help from another person eating meals?: None Help from another person taking care of personal grooming?: None Help from another person toileting, which includes using toliet, bedpan, or urinal?: A Little Help from another person bathing (including washing, rinsing, drying)?: A Little Help from another person to put on and taking off regular upper body clothing?: A Little Help from another person to put on and taking off regular lower body clothing?: A Little 6 Click Score: 20   End of Session Nurse Communication: Mobility status  Activity Tolerance: Patient tolerated treatment well Patient left: in chair;with call bell/phone within reach;with chair alarm set  OT Visit Diagnosis: Unsteadiness on feet (R26.81);Pain                Time: 9323-5573 OT  Time Calculation (min): 23 min Charges:  OT General Charges $OT Visit: 1 Visit OT Evaluation $OT Eval Moderate Complexity: 1 Mod  07/06/2021  RP, OTR/L  Acute Rehabilitation Services  Office:  [phone]   Metta Clines 07/06/2021, 2:03 PM

## 2021-07-06 NOTE — Procedures (Signed)
I was present at this dialysis session. I have reviewed the session itself and made appropriate changes.   Extubated, still on clevidipine.  Interactive.  AVF, 4K, 2L UF goal tolerating well.    After HD would restart home CCB and BB to ween IV meds.    Filed Weights   07/04/21 1945 07/04/21 2215 07/06/21 0457  Weight: 54.3 kg 54.3 kg 56.5 kg    Recent Labs  Lab 07/06/21 0026  NA 138  K 3.5  CL 103  CO2 23  GLUCOSE 106*  BUN 28*  CREATININE 5.85*  CALCIUM 8.6*  PHOS 3.7    Recent Labs  Lab 07/04/21 1018 07/04/21 1234 07/05/21 0455 07/05/21 0716 07/06/21 0026  WBC 13.7*  --   --  8.9 10.3  HGB 10.6*   < > 11.6* 10.4* 9.4*  HCT 34.0*   < > 34.0* 33.5* 29.7*  MCV 98.6  --   --  93.1 91.7  PLT 126*  --   --  114* 130*   < > = values in this interval not displayed.    Scheduled Meds:  Chlorhexidine Gluconate Cloth  6 each Topical Daily   docusate  100 mg Per Tube BID   feeding supplement (NEPRO CARB STEADY)  237 mL Oral BID BM   heparin  5,000 Units Subcutaneous Q8H   insulin aspart  0-9 Units Subcutaneous Q4H   iohexol  100 mL Intravenous Once   multivitamin  1 tablet Oral QHS   pantoprazole (PROTONIX) IV  40 mg Intravenous QHS   polyethylene glycol  17 g Per Tube Daily   Continuous Infusions:  sodium chloride Stopped (07/05/21 1832)   azithromycin Stopped (07/05/21 1133)   cefTRIAXone (ROCEPHIN)  IV Stopped (07/05/21 1021)   clevidipine 9 mg/hr (07/06/21 0559)   dexmedetomidine (PRECEDEX) IV infusion Stopped (07/05/21 1103)   lactated ringers Stopped (07/04/21 1324)   PRN Meds:.sodium chloride, acetaminophen, docusate, fentaNYL (SUBLIMAZE) injection, fentaNYL (SUBLIMAZE) injection, ipratropium-albuterol, melatonin, ondansetron (ZOFRAN) IV, phenol, polyethylene glycol   Stacey Grippe  MD 07/06/2021, 8:09 AM

## 2021-07-06 NOTE — Progress Notes (Signed)
OT Cancellation Note  Patient Details Name: [name] MRN: [id number] DOB: 12/06/1932   Cancelled Treatment:    Reason Eval/Treat Not Completed: Patient currently undergoing dialysis.  OT to continue efforts as appropriate.    Leanda Padmore D Gao Mitnick 07/06/2021, 11:30 AM

## 2021-07-07 ENCOUNTER — Inpatient Hospital Stay (HOSPITAL_COMMUNITY): Payer: Medicare Other

## 2021-07-07 DIAGNOSIS — I469 Cardiac arrest, cause unspecified: Secondary | ICD-10-CM | POA: Diagnosis not present

## 2021-07-07 LAB — CBC
HCT: 29.6 % — ABNORMAL LOW (ref 36.0–46.0)
Hemoglobin: 9 g/dL — ABNORMAL LOW (ref 12.0–15.0)
MCH: 28.3 pg (ref 26.0–34.0)
MCHC: 30.4 g/dL (ref 30.0–36.0)
MCV: 93.1 fL (ref 80.0–100.0)
Platelets: 123 10*3/uL — ABNORMAL LOW (ref 150–400)
RBC: 3.18 MIL/uL — ABNORMAL LOW (ref 3.87–5.11)
RDW: 17.2 % — ABNORMAL HIGH (ref 11.5–15.5)
WBC: 9.8 10*3/uL (ref 4.0–10.5)
nRBC: 0 % (ref 0.0–0.2)

## 2021-07-07 LAB — BASIC METABOLIC PANEL
Anion gap: 10 (ref 5–15)
BUN: 17 mg/dL (ref 8–23)
CO2: 27 mmol/L (ref 22–32)
Calcium: 8.8 mg/dL — ABNORMAL LOW (ref 8.9–10.3)
Chloride: 101 mmol/L (ref 98–111)
Creatinine, Ser: 4.41 mg/dL — ABNORMAL HIGH (ref 0.44–1.00)
GFR, Estimated: 9 mL/min — ABNORMAL LOW (ref >60)
Glucose, Bld: 113 mg/dL — ABNORMAL HIGH (ref 70–99)
Potassium: 3.9 mmol/L (ref 3.5–5.1)
Sodium: 138 mmol/L (ref 135–145)

## 2021-07-07 LAB — GLUCOSE, CAPILLARY
Glucose-Capillary: 130 mg/dL — ABNORMAL HIGH (ref 70–99)
Glucose-Capillary: 129 mg/dL — ABNORMAL HIGH (ref 70–99)
Glucose-Capillary: 176 mg/dL — ABNORMAL HIGH (ref 70–99)
Glucose-Capillary: 123 mg/dL — ABNORMAL HIGH (ref 70–99)

## 2021-07-07 LAB — MAGNESIUM: Magnesium: 2 mg/dL (ref 1.7–2.4)

## 2021-07-07 IMAGING — CT CT HEAD W/O CM
4 series · 16 of 47 positions shown, 18 images · non-contrast
Comparison: Head CT [DATE].

CLINICAL DATA: 88-year-old female TIA.

EXAM:
CT HEAD WITHOUT CONTRAST
TECHNIQUE: Contiguous axial images were obtained from the base of the skull
through the vertex without intravenous contrast.

[Series 3: head without · axial · non-contrast · 0.42mm/px · z∈[-99,+21]mm · 7 of 33 slices shown, 9 images]
[im 5/33  brain]
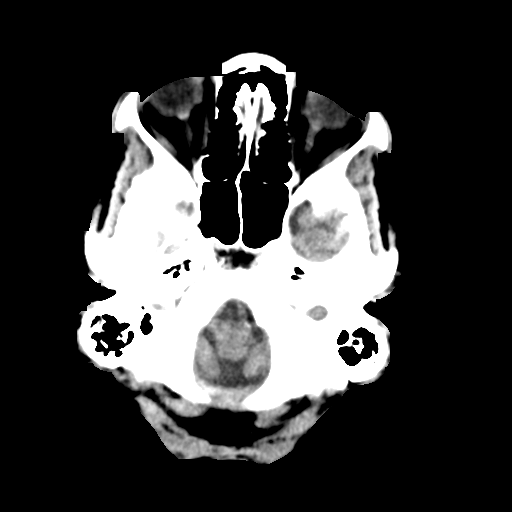
[im 5/33  bone]
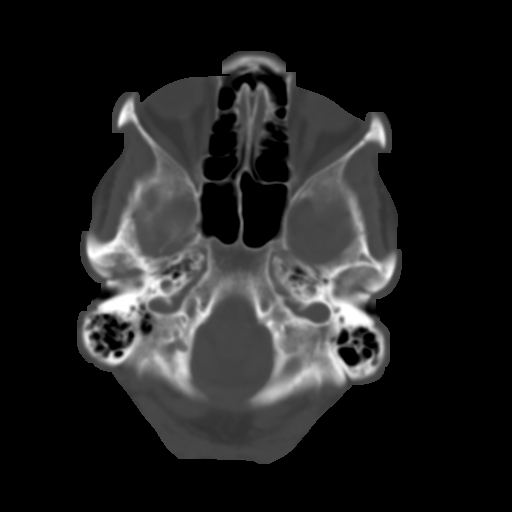
[im 9/33  brain]
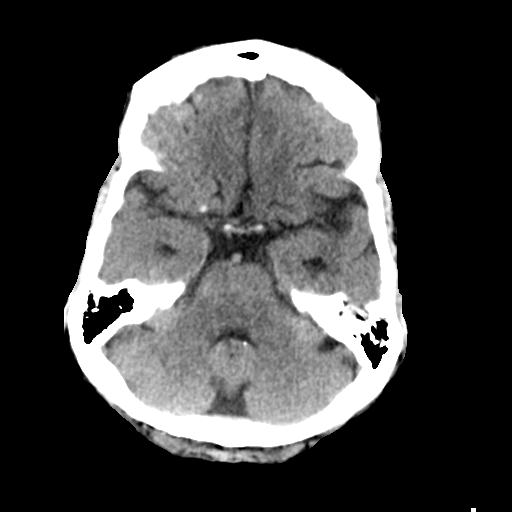
[im 13/33  brain]
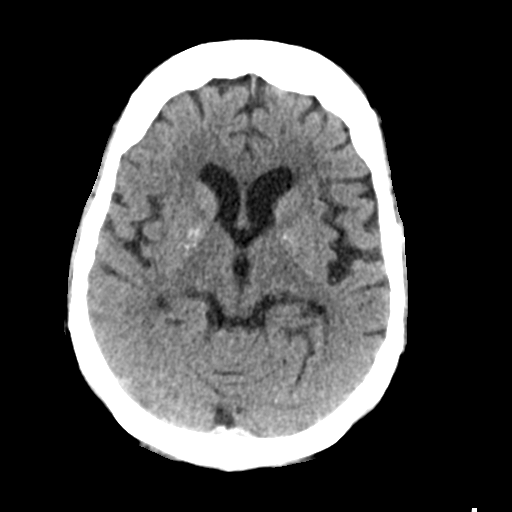
[im 17/33  brain]
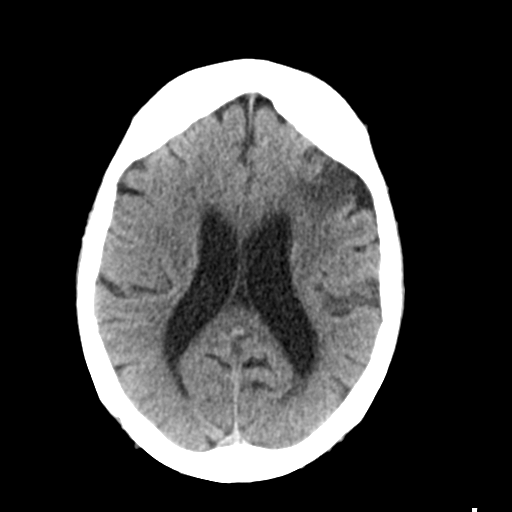
[im 21/33  brain]
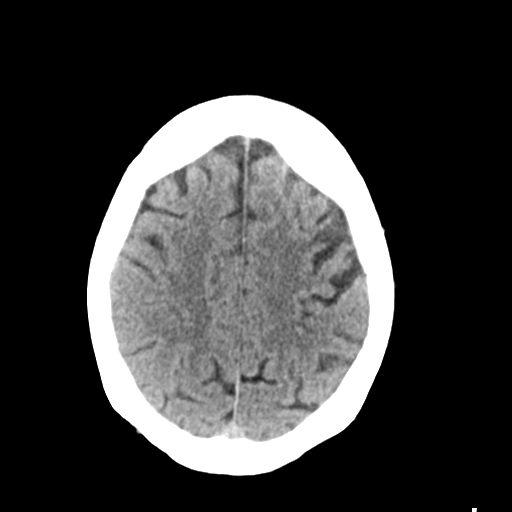
[im 21/33  bone]
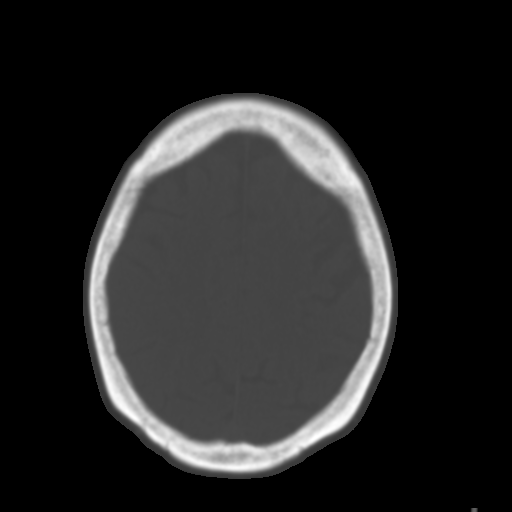
[im 25/33  brain]
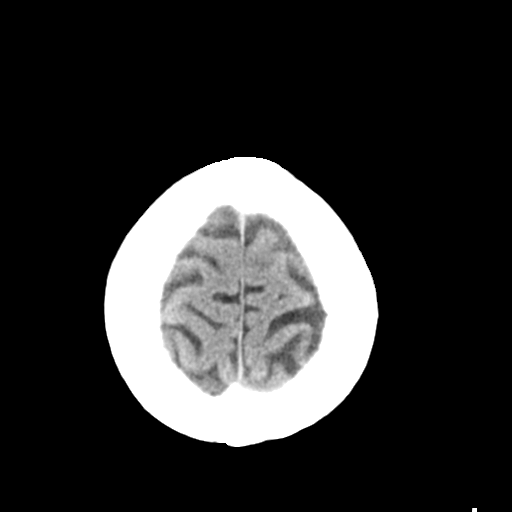
[im 29/33  brain]
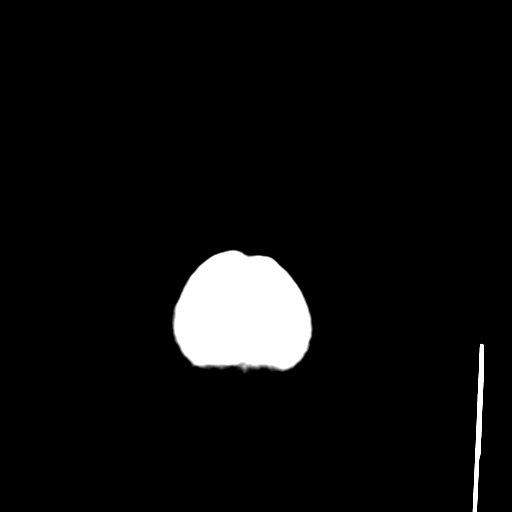

[Series 4: head bone · axial · 0.42mm/px · z∈[-103,-71]mm · 3 of 81 slices shown]
[im 9/81  bone]
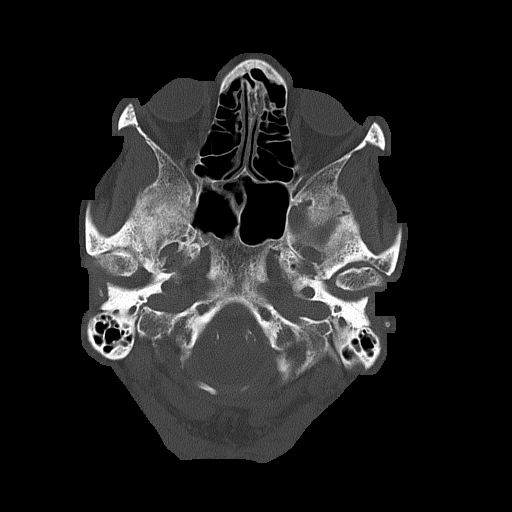
[im 17/81  bone]
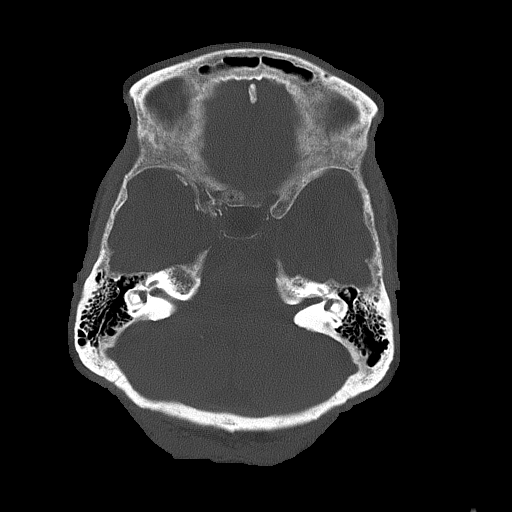
[im 25/81  bone]
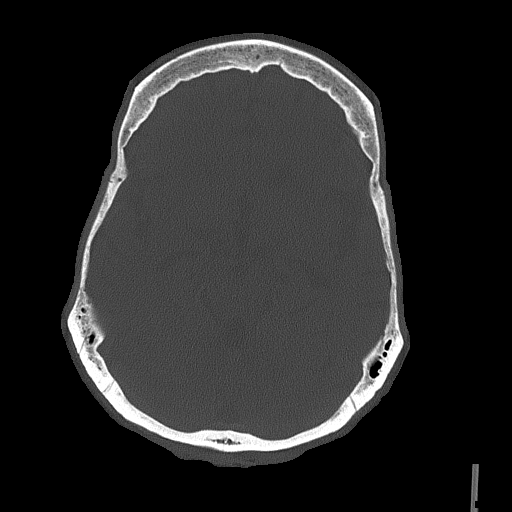

[Series 5: head without cor · coronal · non-contrast · 0.27mm/px · 3 of 67 slices shown]
[im 23/67  brain]
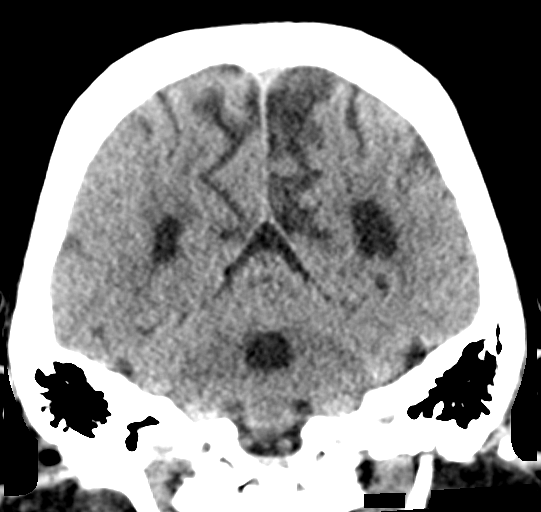
[im 30/67  brain]
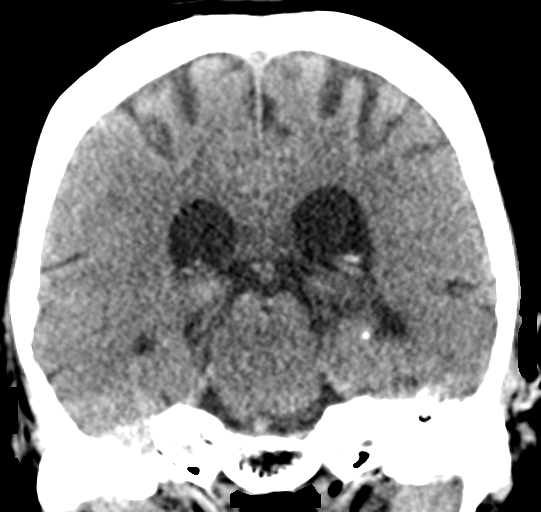
[im 37/67  brain]
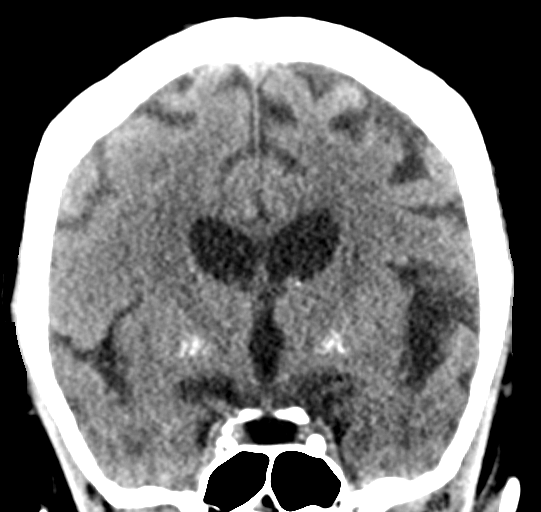

[Series 6: head without sag · sagittal · non-contrast · 0.30mm/px · 3 of 52 slices shown]
[im 18/52  brain]
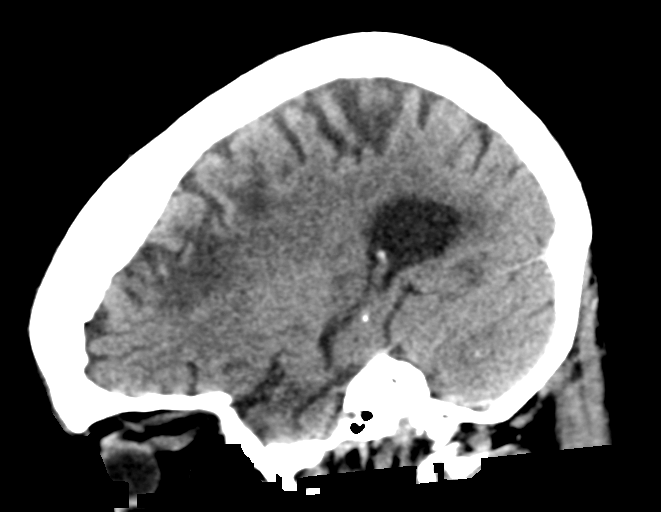
[im 26/52  brain]
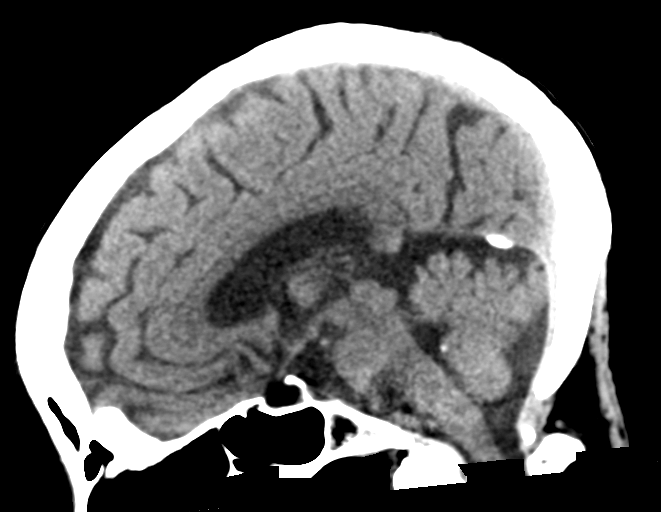
[im 35/52  brain]
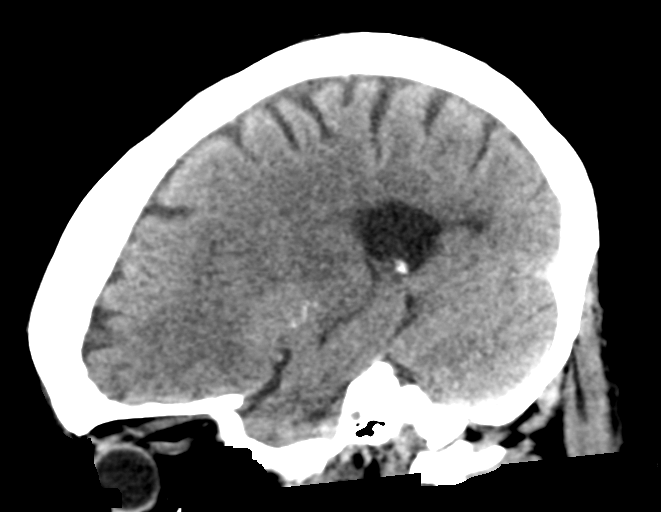

[16 of 47 positions shown; findings below may reference images not displayed]

FINDINGS: Brain: Stable bilateral basal ganglia vascular calcifications.
Stable encephalomalacia in the left middle frontal gyrus along the
anterior insula and operculum. Elsewhere gray-white matter
differentiation remains normal for age.

No superimposed midline shift, ventriculomegaly, mass effect,
evidence of mass lesion, intracranial hemorrhage or evidence of
cortically based acute infarction.

Vascular: Calcified atherosclerosis at the skull base. No suspicious
intracranial vascular hyperdensity.

Skull: Questionable chronic left orbital floor fracture. No acute
osseous abnormality identified.

Sinuses/Orbits: Visualized paranasal sinuses and mastoids are stable
and well aerated.

Other: No acute orbit or scalp soft tissue finding.
IMPRESSION: 1. Stable non contrast CT appearance of the brain. No acute or
evolving infarct is identified.
2. Stable chronic appearing left MCA anterior division territory
infarct.

## 2021-07-07 MED ORDER — ALLOPURINOL 100 MG PO TABS
100.0000 mg | ORAL_TABLET | Freq: Every day | ORAL | Status: DC
  Administered 2021-07-07 – 2021-07-16 (×7): 100 mg via ORAL
  Filled 2021-07-07 (×8): qty 1

## 2021-07-07 MED ORDER — IBUPROFEN 200 MG PO TABS
600.0000 mg | ORAL_TABLET | Freq: Four times a day (QID) | ORAL | Status: DC | PRN
  Administered 2021-07-07 – 2021-07-14 (×3): 600 mg via ORAL
  Filled 2021-07-07 (×3): qty 3

## 2021-07-07 MED ORDER — DOCUSATE SODIUM 100 MG PO CAPS
100.0000 mg | ORAL_CAPSULE | Freq: Two times a day (BID) | ORAL | Status: DC
  Administered 2021-07-07 – 2021-07-16 (×16): 100 mg via ORAL
  Filled 2021-07-07 (×16): qty 1

## 2021-07-07 MED ORDER — ROPINIROLE HCL 1 MG PO TABS
0.5000 mg | ORAL_TABLET | Freq: Every day | ORAL | Status: DC
  Administered 2021-07-07 – 2021-07-15 (×9): 0.5 mg via ORAL
  Filled 2021-07-07 (×11): qty 1

## 2021-07-07 MED ORDER — ACETAMINOPHEN 325 MG PO TABS
650.0000 mg | ORAL_TABLET | Freq: Three times a day (TID) | ORAL | Status: DC | PRN
  Administered 2021-07-07 – 2021-07-11 (×5): 650 mg via ORAL
  Filled 2021-07-07 (×5): qty 2

## 2021-07-07 MED ORDER — CLONIDINE HCL 0.1 MG PO TABS
0.1000 mg | ORAL_TABLET | Freq: Two times a day (BID) | ORAL | Status: DC
  Administered 2021-07-07 – 2021-07-16 (×17): 0.1 mg via ORAL
  Filled 2021-07-07 (×17): qty 1

## 2021-07-07 MED ORDER — DOCUSATE SODIUM 100 MG PO CAPS
100.0000 mg | ORAL_CAPSULE | Freq: Two times a day (BID) | ORAL | Status: DC | PRN
  Administered 2021-07-16: 12:00:00 100 mg via ORAL

## 2021-07-07 MED ORDER — CARVEDILOL 12.5 MG PO TABS
25.0000 mg | ORAL_TABLET | ORAL | Status: DC
  Administered 2021-07-07 – 2021-07-15 (×12): 25 mg via ORAL
  Filled 2021-07-07 (×2): qty 1
  Filled 2021-07-07 (×2): qty 2
  Filled 2021-07-07: qty 1
  Filled 2021-07-07: qty 2
  Filled 2021-07-07: qty 1
  Filled 2021-07-07 (×5): qty 2

## 2021-07-07 MED ORDER — SIMVASTATIN 20 MG PO TABS
20.0000 mg | ORAL_TABLET | Freq: Every day | ORAL | Status: DC
  Administered 2021-07-07 – 2021-07-15 (×9): 20 mg via ORAL
  Filled 2021-07-07 (×10): qty 1

## 2021-07-07 MED ORDER — SENNA 8.6 MG PO TABS
1.0000 | ORAL_TABLET | Freq: Every day | ORAL | Status: DC
Start: 2021-07-07 — End: 2021-07-10
  Administered 2021-07-08: 8.6 mg via ORAL
  Filled 2021-07-07 (×2): qty 1

## 2021-07-07 NOTE — Progress Notes (Signed)
NAME:  Stacey Dorsey, MRN:  [id number], DOB:  1933-01-05, LOS: 3 ADMISSION DATE:  07/04/2021, CONSULTATION DATE:  07/04/21 REFERRING MD:  Gilford Raid - EM, CHIEF COMPLAINT:  Cardiac arrest   History of Present Illness:  87 yo F hx ESRD on HD, poorly controlled HTN, DM, HLD, (presumed) COPD  who presented to [facility] 12/8 following cardiac arrest. Patient was in usual state of health until 12/8 morning when she felt short of breath while getting ready to go to dialysis. EMS was called, on EMS arrival pt was altered and subsequently sustained witnessed cardiac arrest -- PEA. Received 15 min CPR, 1 epi, 1g calcium, 50mg  bicarb, as well as 165mcg fent and  5mg  versed. King placed in field which was exchanged for ETT in ED. Started on rocephin and azithromycin.  Pertinent  Medical History  ESRD    Significant Hospital Events: Including procedures, antibiotic start and stop dates in addition to other pertinent events   12/8 OOH witnessed arrest. 15 min CPR before ROSC. PEA, precipitated by SOB, resp failure. Admitting to ICU  CT-PA >> no PE, bilateral scattered groundglass opacities, question interstitial edema, bibasilar right greater than left consolidation, multiple bilateral rib fractures Echocardiogram 12/8 >> LVEF 50-55%, moderate concentric hypertrophy with grade 1 diastolic dysfunction, normal RV size and function, severely elevated PASP with estimated RVSP 61.5 mmHg.  Interim History / Subjective:   I/O -620cc She had some delirium overnight, question sundowning, question component narcotics Still with significant rib pain Cleviprex is off   Objective   Blood pressure (!) 162/115, pulse 87, temperature 98.8 F (37.1 C), temperature source Oral, resp. rate 18, height 5\' 9"  (1.753 m), weight 62.7 kg, SpO2 99 %.        Intake/Output Summary (Last 24 hours) at 07/07/2021 0723 Last data filed at 07/06/2021 2200 Gross per 24 hour  Intake 536.14 ml  Output 2000 ml  Net -1463.86 ml   Filed  Weights   07/06/21 0730 07/06/21 1115 07/07/21 0354  Weight: 56.5 kg 54.5 kg 62.7 kg    Examination: General: Thin ill-appearing woman, laying in bed no distress HENT: Oropharynx clear, strong voice, no stridor or secretions Lungs: Decreased to both bases.  No wheezing or crackles.  Tender to palpation along costal margin and upper abdomen Cardiovascular: Regular, distant, no murmur Abdomen: Nondistended, positive bowel sounds Extremities: No edema Neuro: Awake, alert, interacting appropriately.  Oriented to self and situation.  Had some delirium overnight but not showing signs of this currently GU: clear yellow urine   Resolved Hospital Problem list     Assessment & Plan:   Out of hospital PEA arrest, presumed principally cardiopulmonary -preceding SOB. Likely hypoxia OOH.  -Does have R sided ASD : possible aspiration or CAP. Flu and Covid neg.  -CT-PA > no PE, R>L basilar ASD P -volume removal with HD as able -Bronchodilators as ordered -Pulmonary hygiene, especially given her rib soreness.  She will need narcotics to enhance cough, deep breathing.  We will have to balance with mental status   Acute encephalopathy, metabolic. Improved Delirium, suspect due to critical illness, ICU -initially hypoxia, hypoperfusion. Now sedation related  -CT H no acute abnormality. Neuro exam promising on PCCM exam in ED. -does have degree of hyperammonemia  P -Improved mental status but with some delirium.  Initiate delirium prevention maneuvers, minimize sedating medications although she is going to require some pain control   Acute respiratory failure with hypoxia R sided ASD : Aspiration PNA vs CAP  Presumed COPD  on chronic O2 -managed by PCP, I don't see PFTs. No features of AECOPD now P -Pulmonary hygiene as above -Continue ceftriaxone and azithromycin, plan for 7 days total   ESRD -Appreciate nephrology management.  Volume removal as tolerated   HTN P -Cleviprex is  off -Added back enteral carvedilol, nifedipine -Start clonidine 12/11  DM2 P -Sliding scale insulin as indicated   Thrombocytopenia Anemia -suspect chronic in setting of ESRD  P -Follow CBC  Dispo: hopefully to progressive unit 12/11 if she tolerates meds, MS stable, BP stable.   Best Practice (right click and "Reselect all SmartList Selections" daily)   Diet/type: NPO DVT prophylaxis: prophylactic heparin  GI prophylaxis: PPI Lines: N/A Foley:  Yes, and it is still needed Code Status:  full code Last date of multidisciplinary goals of care discussion [12/8 - daughter ]  Labs   CBC: Recent Labs  Lab 07/04/21 1018 07/04/21 1234 07/05/21 0455 07/05/21 0716 07/06/21 0026 07/07/21 0233  WBC 13.7*  --   --  8.9 10.3 9.8  HGB 10.6* 11.2* 11.6* 10.4* 9.4* 9.0*  HCT 34.0* 33.0* 34.0* 33.5* 29.7* 29.6*  MCV 98.6  --   --  93.1 91.7 93.1  PLT 126*  --   --  114* 130* 123*    Basic Metabolic Panel: Recent Labs  Lab 07/04/21 0951 07/04/21 1018 07/04/21 1234 07/05/21 0455 07/05/21 0510 07/06/21 0026 07/07/21 0233  NA 143 134* 143 141 140 138 138  K 4.3 4.4 4.0 3.1* 3.2* 3.5 3.9  CL 107 100  --   --  101 103 101  CO2  --  19*  --   --  24 23 27   GLUCOSE 228* 205*  --   --  109* 106* 113*  BUN 36* 28*  --   --  22 28* 17  CREATININE 6.00* 5.84*  --   --  4.91* 5.85* 4.41*  CALCIUM  --  9.0  --   --  8.8* 8.6* 8.8*  MG  --  2.0  --   --  1.9 2.1 2.0  PHOS  --   --   --   --   --  3.7  --    GFR: Estimated Creatinine Clearance: 9.1 mL/min (A) (by C-G formula based on SCr of 4.41 mg/dL (H)). Recent Labs  Lab 07/04/21 1018 07/04/21 1843 07/05/21 0716 07/06/21 0026 07/07/21 0233  WBC 13.7*  --  8.9 10.3 9.8  LATICACIDVEN 4.1* 1.6  --   --   --     Liver Function Tests: Recent Labs  Lab 07/04/21 1018  AST 93*  ALT 60*  ALKPHOS 71  BILITOT 1.2  PROT 5.2*  ALBUMIN 2.7*   No results for input(s): LIPASE, AMYLASE in the last 168 hours. Recent Labs  Lab  07/04/21 1018 07/05/21 0510  AMMONIA 68* 32    ABG    Component Value Date/Time   PHART 7.519 (H) 07/05/2021 0455   PCO2ART 31.2 (L) 07/05/2021 0455   PO2ART 88 07/05/2021 0455   HCO3 25.6 07/05/2021 0455   TCO2 27 07/05/2021 0455   O2SAT 98.0 07/05/2021 0455     Coagulation Profile: No results for input(s): INR, PROTIME in the last 168 hours.  Cardiac Enzymes: No results for input(s): CKTOTAL, CKMB, CKMBINDEX, TROPONINI in the last 168 hours.  HbA1C: Hgb A1c MFr Bld  Date/Time Value Ref Range Status  07/04/2021 10:18 AM 6.1 (H) 4.8 - 5.6 % Final    Comment:    (NOTE)  Pre diabetes:          5.7%-6.4%  Diabetes:              >6.4%  Glycemic control for   <7.0% adults with diabetes     CBG: Recent Labs  Lab 07/06/21 1152 07/06/21 1538 07/06/21 1924 07/06/21 2324 07/07/21 0338  GLUCAP 102* 146* 123* 95 130*      Critical care time: 31 minutes     CRITICAL CARE Performed by: Thomasena Edis, MD, PhD 07/07/2021, 7:23 AM [facility] [phone] or if no answer before 7:00PM call [phone] For any issues after 7:00PM please call eLink [phone]

## 2021-07-07 NOTE — Plan of Care (Signed)

## 2021-07-07 NOTE — Progress Notes (Signed)
Admit: 07/04/2021 LOS: 3  47F ESRD THS s/p cardiac arrest 07/04/21  Subjective:  HD yesterday with 2L UF Weened off IV clevidipine C/o pain in chest, after CPR  12/10 0701 - 12/11 0700 In: 536.1 [P.O.:120; I.V.:65.7; IV Piggyback:350.4] Out: 2000   Filed Weights   07/06/21 0730 07/06/21 1115 07/07/21 0354  Weight: 56.5 kg 54.5 kg 62.7 kg    Scheduled Meds:  carvedilol  12.5 mg Oral 2 times per day on Sun Mon Wed Fri   Chlorhexidine Gluconate Cloth  6 each Topical Daily   docusate  100 mg Per Tube BID   feeding supplement (NEPRO CARB STEADY)  237 mL Oral BID BM   heparin  5,000 Units Subcutaneous Q8H   insulin aspart  0-9 Units Subcutaneous Q4H   iohexol  100 mL Intravenous Once   multivitamin  1 tablet Oral QHS   NIFEdipine  30 mg Oral QHS   pantoprazole  40 mg Oral QHS   polyethylene glycol  17 g Per Tube Daily   Continuous Infusions:  sodium chloride Stopped (07/05/21 1832)   azithromycin Stopped (07/06/21 1250)   cefTRIAXone (ROCEPHIN)  IV Stopped (07/06/21 1128)   clevidipine Stopped (07/06/21 1321)   lactated ringers Stopped (07/04/21 1324)   PRN Meds:.sodium chloride, acetaminophen, bisacodyl, docusate, hydrALAZINE, ipratropium-albuterol, labetalol, melatonin, ondansetron (ZOFRAN) IV, oxyCODONE, phenol, polyethylene glycol  Current Labs: reviewed    Physical Exam:  Blood pressure (!) 153/98, pulse 90, temperature 98.8 F (37.1 C), temperature source Oral, resp. rate (!) 23, height 5\' 9"  (1.753 m), weight 62.7 kg, SpO2 96 %. Intubated, sedated Regular, nl s1s2 Course bs/ bl No LEE +B/T of LUE AVG S/nt/nd   OP Dialysis Orders:  [facility] , TTS ,EDW 64.5 kg  2K 2 CA bath 400/500 excess LUA AV GG No heparin Sensipar 30 mg Q dialysis Hectorol 5 MCG q. dialysis Mircera 75 MCG Shaili every 2 weeks last given 1201/20 Venofer 50 mg q. weekly dialysis    A ESRD TTS [facility] LUE AVG, on schedule Out of hospital PEA arrest, per CCM VDRF / Pneumona on  ABX HTN / off clevidipine gtt; back on PO BB and CCB, stable Anemia, stable >10 CKD-BMD: Ca ok, P at goal  P HD Tuesday on schedule  Medication Issues; Preferred narcotic agents for pain control are hydromorphone, fentanyl, and methadone. Morphine should not be used.  Baclofen should be avoided Avoid oral sodium phosphate and magnesium citrate based laxatives / bowel preps    Pearson Grippe MD 07/07/2021, 8:19 AM  Recent Labs  Lab 07/05/21 0510 07/06/21 0026 07/07/21 0233  NA 140 138 138  K 3.2* 3.5 3.9  CL 101 103 101  CO2 24 23 27   GLUCOSE 109* 106* 113*  BUN 22 28* 17  CREATININE 4.91* 5.85* 4.41*  CALCIUM 8.8* 8.6* 8.8*  PHOS  --  3.7  --     Recent Labs  Lab 07/05/21 0716 07/06/21 0026 07/07/21 0233  WBC 8.9 10.3 9.8  HGB 10.4* 9.4* 9.0*  HCT 33.5* 29.7* 29.6*  MCV 93.1 91.7 93.1  PLT 114* 130* 123*

## 2021-07-07 NOTE — Progress Notes (Addendum)
RN entering room for rounds, patient asleep. 1041- Woke patient up to retake blood pressure in chair. Upon awakening patient having objective trouble articulating words. Patient able to follow commands and NIH scale performed at bedside with fellow nurse, Shanon Brow. NIH scale . MD Byrum paged with new onset of symptoms. See new orders  Pt. Stating "something is wrong" "I feel like my brain is crazy" and is having intermittent expressive aphasia.   Lower than usual blood pressures noted after antihypertensives given this morning. Patient is alert, having conservations and following commands.   CT completed without complications- family called to notify of changes, voicemail left.

## 2021-07-08 ENCOUNTER — Inpatient Hospital Stay (HOSPITAL_COMMUNITY): Payer: Medicare Other

## 2021-07-08 ENCOUNTER — Encounter (HOSPITAL_COMMUNITY): Payer: Self-pay | Admitting: Critical Care Medicine

## 2021-07-08 ENCOUNTER — Other Ambulatory Visit: Payer: Self-pay

## 2021-07-08 DIAGNOSIS — E440 Moderate protein-calorie malnutrition: Secondary | ICD-10-CM

## 2021-07-08 DIAGNOSIS — R4701 Aphasia: Secondary | ICD-10-CM

## 2021-07-08 LAB — BASIC METABOLIC PANEL
Anion gap: 12 (ref 5–15)
BUN: 26 mg/dL — ABNORMAL HIGH (ref 8–23)
CO2: 25 mmol/L (ref 22–32)
Calcium: 8.9 mg/dL (ref 8.9–10.3)
Chloride: 101 mmol/L (ref 98–111)
Creatinine, Ser: 6.02 mg/dL — ABNORMAL HIGH (ref 0.44–1.00)
GFR, Estimated: 6 mL/min — ABNORMAL LOW (ref >60)
Glucose, Bld: 90 mg/dL (ref 70–99)
Potassium: 3.8 mmol/L (ref 3.5–5.1)
Sodium: 138 mmol/L (ref 135–145)

## 2021-07-08 LAB — CBC
HCT: 30.9 % — ABNORMAL LOW (ref 36.0–46.0)
Hemoglobin: 9.4 g/dL — ABNORMAL LOW (ref 12.0–15.0)
MCH: 28.5 pg (ref 26.0–34.0)
MCHC: 30.4 g/dL (ref 30.0–36.0)
MCV: 93.6 fL (ref 80.0–100.0)
Platelets: 154 10*3/uL (ref 150–400)
RBC: 3.3 MIL/uL — ABNORMAL LOW (ref 3.87–5.11)
RDW: 17 % — ABNORMAL HIGH (ref 11.5–15.5)
WBC: 9.5 10*3/uL (ref 4.0–10.5)
nRBC: 0 % (ref 0.0–0.2)

## 2021-07-08 LAB — GLUCOSE, CAPILLARY
Glucose-Capillary: 116 mg/dL — ABNORMAL HIGH (ref 70–99)
Glucose-Capillary: 119 mg/dL — ABNORMAL HIGH (ref 70–99)
Glucose-Capillary: 137 mg/dL — ABNORMAL HIGH (ref 70–99)
Glucose-Capillary: 157 mg/dL — ABNORMAL HIGH (ref 70–99)
Glucose-Capillary: 92 mg/dL (ref 70–99)
Glucose-Capillary: 97 mg/dL (ref 70–99)

## 2021-07-08 LAB — MAGNESIUM: Magnesium: 2.2 mg/dL (ref 1.7–2.4)

## 2021-07-08 IMAGING — MR MR HEAD W/O CM
6 of 11 series · 26 of 48 positions shown · non-contrast
Comparison: Head CT yesterday.

CLINICAL DATA: Neuro deficit, acute, stroke suspected.

EXAM:
MRI HEAD WITHOUT CONTRAST
TECHNIQUE: Multiplanar, multiecho pulse sequences of the brain and surrounding
structures were obtained without intravenous contrast.

[Series 2: DWI · axial · 3.0mm · 0.94mm/px · z∈[-53,+99]mm · 8 of 103 slices shown (1 of 2)]
[im 1/103]
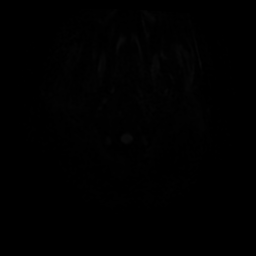
[im 15/103]
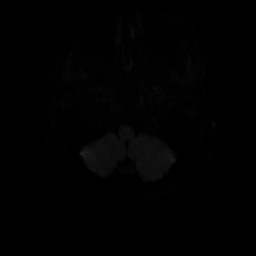
[im 30/103]
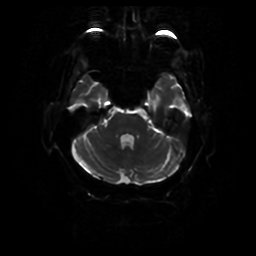
[im 44/103]
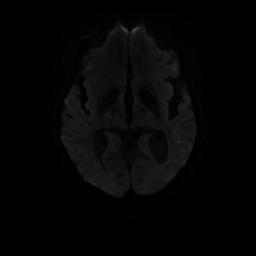
[im 59/103]
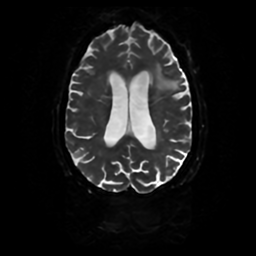
[im 73/103]
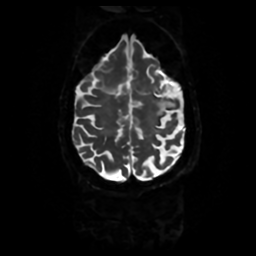
[im 88/103]
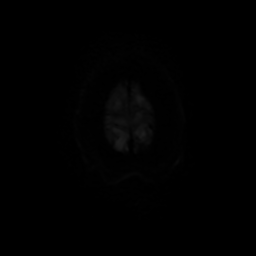
[im 103/103]
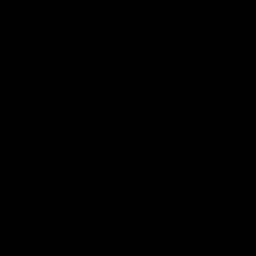

[Series 3: DWI · coronal · 4.0mm · 0.94mm/px · 6 of 76 slices shown (2 of 2)]
[im 1/76]
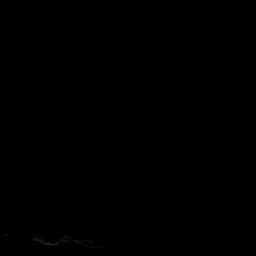
[im 16/76]
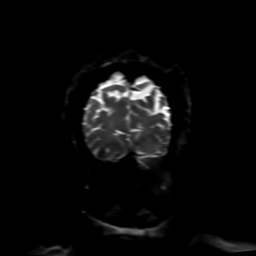
[im 31/76]
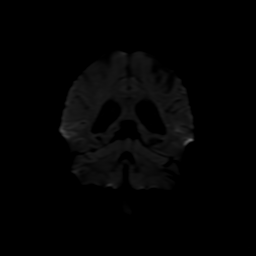
[im 46/76]
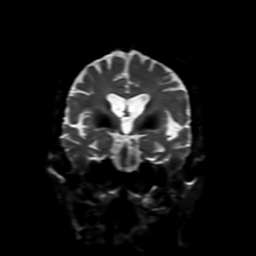
[im 61/76]
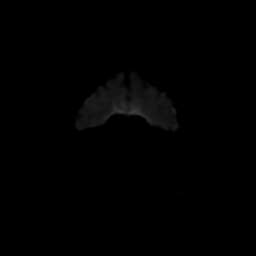
[im 76/76]
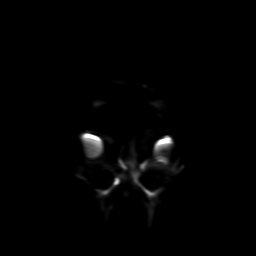

[Series 4: FLAIR · sagittal · 5.0mm · 0.23mm/px · 2 of 26 slices shown (1 of 2)]
[im 1/26]
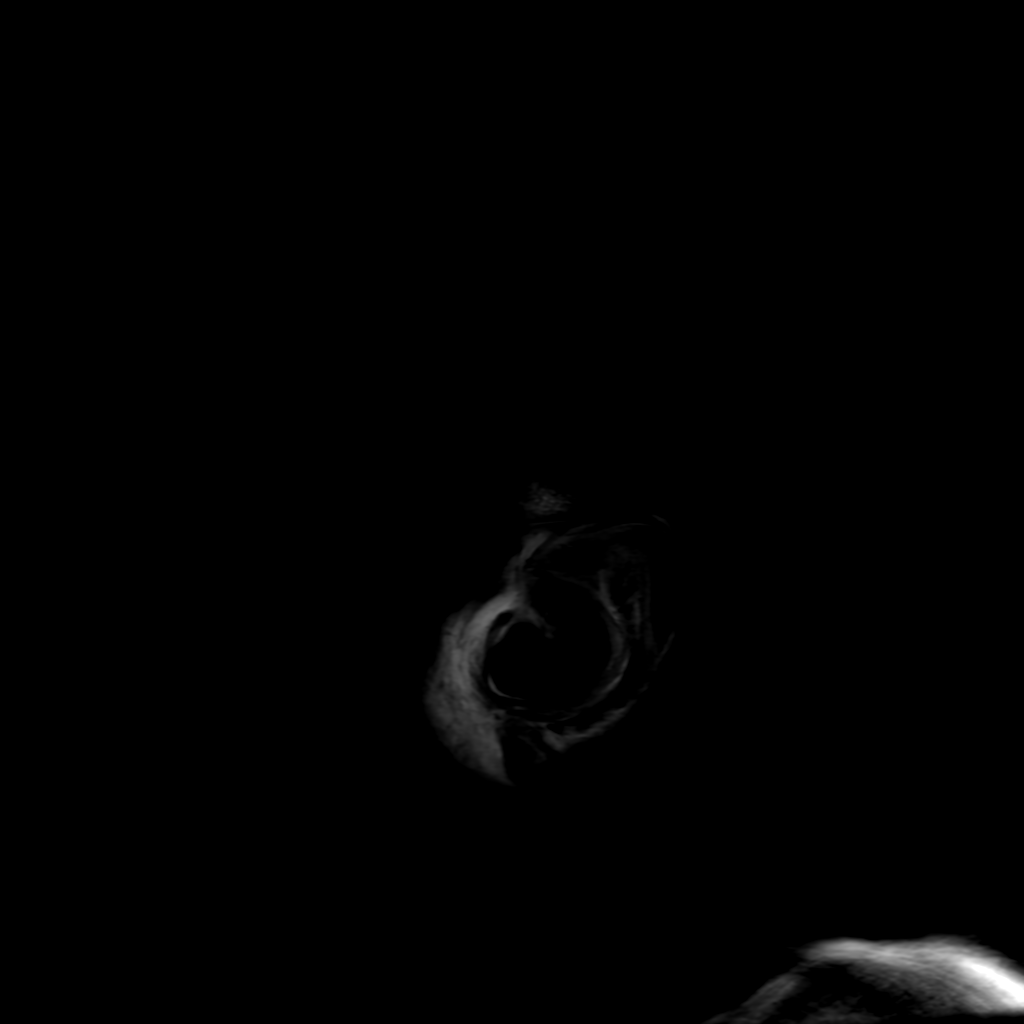
[im 26/26]
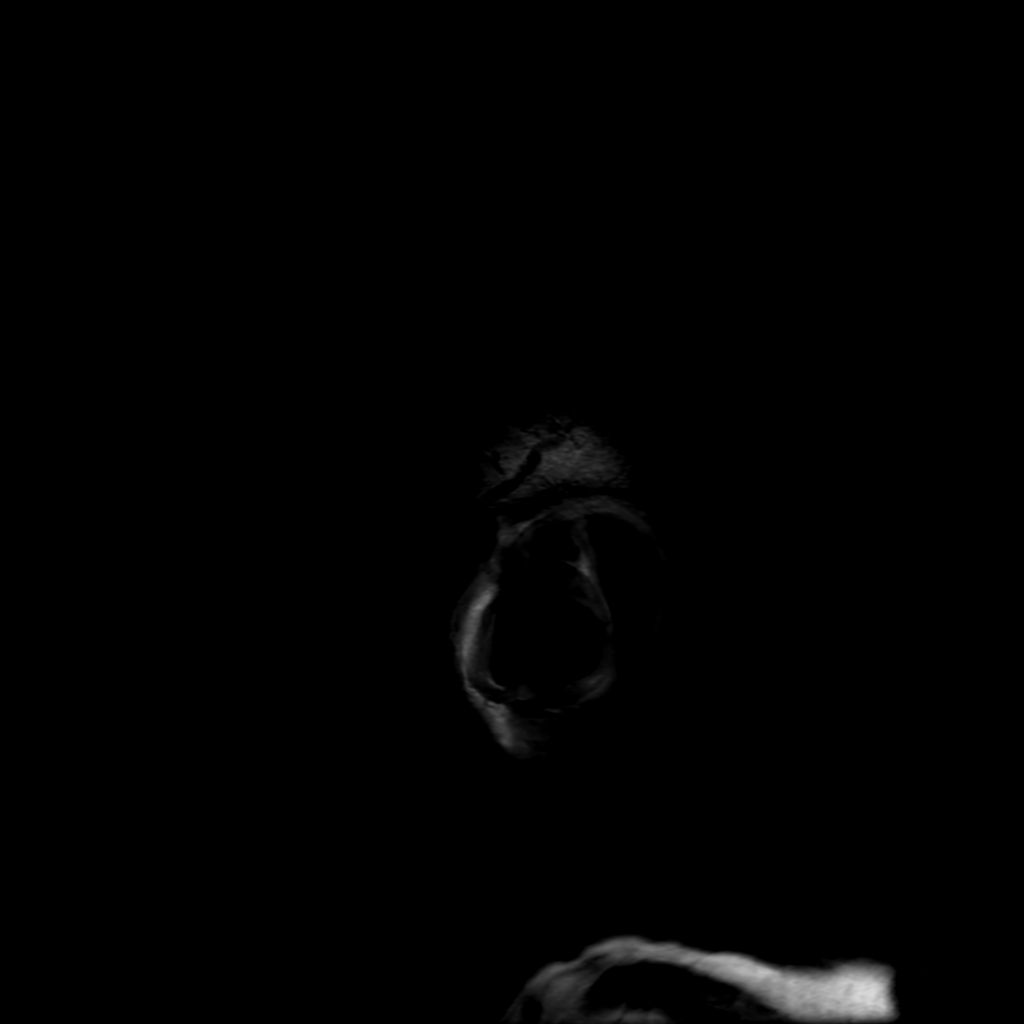

[Series 6: FLAIR · axial · 4.0mm · 0.45mm/px · z∈[-48,+101]mm · 3 of 35 slices shown (2 of 2)]
[im 1/35]
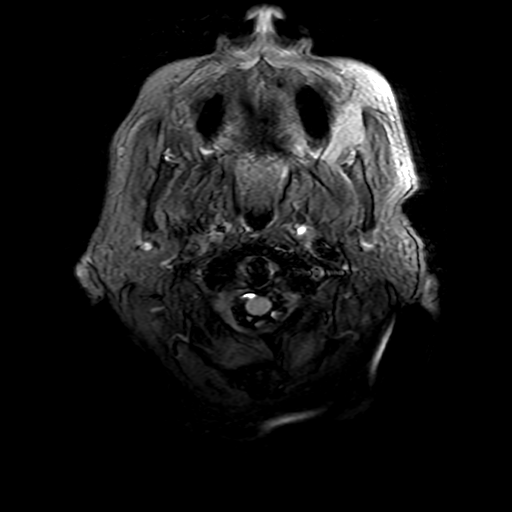
[im 18/35]
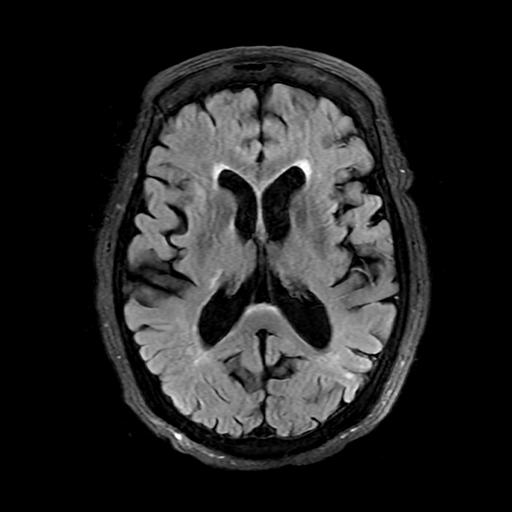
[im 35/35]
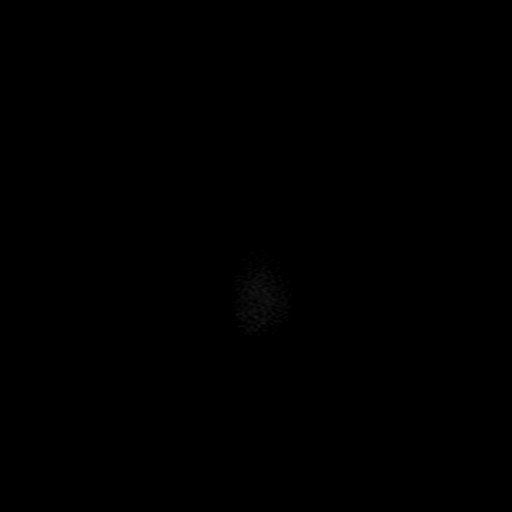

[Series 250: ADC · axial · 3.0mm · 0.94mm/px · z∈[-53,+99]mm · 4 of 52 slices shown (1 of 2)]
[im 1/52]
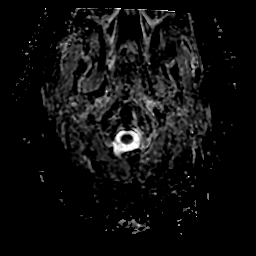
[im 18/52]
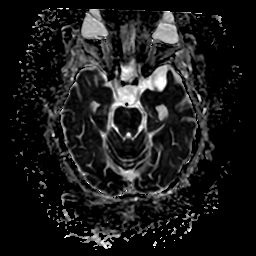
[im 35/52]
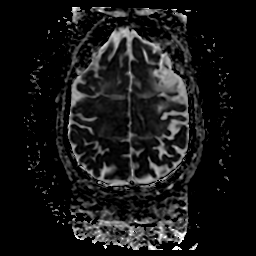
[im 52/52]
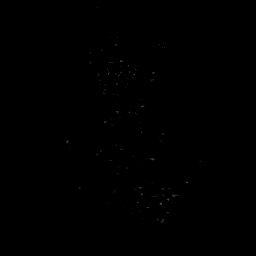

[Series 350: ADC · coronal · 4.0mm · 0.94mm/px · 3 of 38 slices shown (2 of 2)]
[im 1/38]
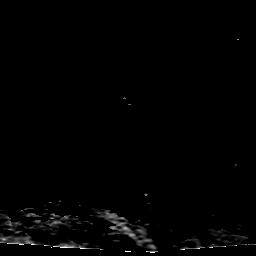
[im 19/38]
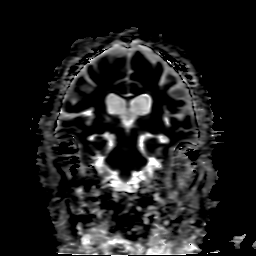
[im 38/38]
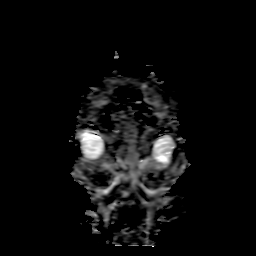

[26 of 48 positions shown; findings below may reference images not displayed]

FINDINGS: Brain: Diffusion imaging shows approximately 10 punctate acute
infarctions scattered within the left MCA territory consistent with
micro embolic infarctions. No large or confluent acute infarction.

No focal abnormality affects the brainstem or cerebellum. Old small
vessel infarction in the right thalamus. Mild chronic small-vessel
ischemic changes elsewhere within the cerebral hemispheric white
matter. Old cortical and subcortical infarctions in the left frontal
lobe. No mass, hydrocephalus or extra-axial collection.

Vascular: Major vessels at the base of the brain show flow.

Skull and upper cervical spine: Negative

Sinuses/Orbits: Clear/normal

Other: Small right mastoid effusion.
IMPRESSION: Approximately 10 punctate acute infarctions scattered within the
left MCA territory consistent with micro embolic infarctions.

Old cortical and subcortical infarctions in the left frontal lobe.

Mild chronic small-vessel ischemic changes elsewhere within the
cerebral hemispheric white matter. Old lacunar infarction right
thalamus.

## 2021-07-08 MED ORDER — HYDROMORPHONE HCL 2 MG PO TABS
1.0000 mg | ORAL_TABLET | ORAL | Status: DC | PRN
  Administered 2021-07-08 – 2021-07-11 (×4): 1 mg via ORAL
  Filled 2021-07-08 (×5): qty 1

## 2021-07-08 MED ORDER — HYDROMORPHONE HCL 2 MG PO TABS
2.0000 mg | ORAL_TABLET | ORAL | Status: DC | PRN
  Administered 2021-07-11 – 2021-07-12 (×3): 2 mg via ORAL
  Filled 2021-07-08 (×2): qty 1

## 2021-07-08 NOTE — TOC Progression Note (Addendum)
Transition of Care [facility]) - Progression Note    Patient Details  Name: Stacey Dorsey MRN: [id number] Date of Birth: [birth year]  Transition of Care [facility]) CM/SW Contact  Zenon Mayo, RN Phone Number: 07/08/2021, 4:37 PM  Clinical Narrative:    From home with daughter, had PEA, cpr for 15 minutes, asp pna vs cap, on iv abx, HD patient. , TOC will continue to follow for dc needs. Pt eval rec CIR.        Expected Discharge Plan and Services                                                 Social Determinants of Health (SDOH) Interventions    Readmission Risk Interventions No flowsheet data found.

## 2021-07-08 NOTE — Progress Notes (Signed)
Occupational Therapy Treatment Patient Details Name: Stacey Dorsey MRN: [id number] DOB: Sep 23, 1932 Today's Date: 07/08/2021   History of present illness 87 yo F hx ESRD on HD, poorly controlled HTN, DM, HLD, (presumed) COPD  who presented to [facility] 12/8 following cardiac arrest. Patient was in usual state of health until 12/8 morning when she felt short of breath while getting ready to go to dialysis.   OT comments  This 87 yo female admitted with above seen today and not doing as well today due pain and confusion. She will continue to benefit from acute OT with follow up HHOT and 24 hour S/prn A.   Recommendations for follow up therapy are one component of a multi-disciplinary discharge planning process, led by the attending physician.  Recommendations may be updated based on patient status, additional functional criteria and insurance authorization.    Follow Up Recommendations  Home health OT    Assistance Recommended at Discharge Frequent or constant Supervision/Assistance  Equipment Recommendations  None recommended by OT       Precautions / Restrictions Precautions Precautions: Fall Restrictions Weight Bearing Restrictions: No       Mobility Bed Mobility Overal bed mobility: Needs Assistance Bed Mobility: Supine to Sit   Sidelying to sit: Mod assist       General bed mobility comments: cues for squencing, A for trunk    Transfers Overall transfer level: Needs assistance Equipment used: 1 person hand held assist Transfers: Sit to/from Stand;Bed to chair/wheelchair/BSC Sit to Stand: Min assist   Step pivot transfers: Min assist             Balance Overall balance assessment: Needs assistance Sitting-balance support: No upper extremity supported;Feet supported Sitting balance-Leahy Scale: Good     Standing balance support: Single extremity supported Standing balance-Leahy Scale: Poor                             ADL either performed or  assessed with clinical judgement   ADL Overall ADL's : Needs assistance/impaired Eating/Feeding: Set up Eating/Feeding Details (indicate cue type and reason): sittiing in recliner Grooming: Set up;Sitting Grooming Details (indicate cue type and reason): EOB to wash face             Lower Body Dressing: Moderate assistance Lower Body Dressing Details (indicate cue type and reason): min A sit<>stand; pain limiting factor in ADLs Toilet Transfer: Minimal assistance;Stand-pivot                  Extremity/Trunk Assessment Upper Extremity Assessment Upper Extremity Assessment: Generalized weakness            Vision Patient Visual Report: No change from baseline            Cognition Arousal/Alertness: Awake/alert Behavior During Therapy: WFL for tasks assessed/performed Overall Cognitive Status: No family/caregiver present to determine baseline cognitive functioning Area of Impairment: Memory;Safety/judgement                     Memory: Decreased short-term memory ("is there a nurse here? I havent seen one all night or this morning"  RN had been in eariler this AM and she came in while I was with patient but told us she would be back when I finished.) Following Commands: Follows one step commands consistently Safety/Judgement: Decreased awareness of safety;Decreased awareness of deficits  Pertinent Vitals/ Pain       Pain Assessment: Faces Faces Pain Scale: Hurts whole lot Pain Location: chest--witih movement/coughing Pain Descriptors / Indicators: Sharp Pain Intervention(s): Limited activity within patient's tolerance;Monitored during session;Repositioned (had her hold heart pilliow)         Frequency  Min 2X/week        Progress Toward Goals  OT Goals(current goals can now be found in the care plan section)  Progress towards OT goals: Not progressing toward goals - comment (having more pain so not able to do as  much for herself today; also confusion)  Acute Rehab OT Goals Patient Stated Goal: to get nurse to check on her OT Goal Formulation: With patient Time For Goal Achievement: 07/19/21 Potential to Achieve Goals: Good  Plan Discharge plan needs to be updated       AM-PAC OT "6 Clicks" Daily Activity     Outcome Measure   Help from another person eating meals?: A Little Help from another person taking care of personal grooming?: A Little Help from another person toileting, which includes using toliet, bedpan, or urinal?: A Lot Help from another person bathing (including washing, rinsing, drying)?: A Lot Help from another person to put on and taking off regular upper body clothing?: A Lot Help from another person to put on and taking off regular lower body clothing?: A Lot 6 Click Score: 14    End of Session Equipment Utilized During Treatment: Gait belt  OT Visit Diagnosis: Unsteadiness on feet (R26.81);Pain Pain - part of body:  (chest)   Activity Tolerance Patient limited by pain   Patient Left in chair;with call bell/phone within reach;with chair alarm set   Nurse Communication Mobility status        Time: 4562-5638 OT Time Calculation (min): 33 min  Charges: OT General Charges $OT Visit: 1 Visit OT Treatments $Self Care/Home Management : 23-37 mins  Golden Circle, OTR/L Acute [employer] Pager [phone] Office [phone]   Almon Register 07/08/2021, 12:12 PM

## 2021-07-08 NOTE — Progress Notes (Signed)
Physical Therapy Treatment Patient Details Name: Stacey Dorsey MRN: [id number] DOB: 09-Jan-1933 Today's Date: 07/08/2021   History of Present Illness 87 yo female presenting 12/8 after cardiac arrest requiring 15 min CPR prior to ROSC. Intubated PTA, extubated 12/9. On 12/11, pt with new onset word-finding, rapid response called and neurology consulted. MRI ordered 12/12. PMH includes: ESRD on HD, poorly controlled HTN, DM, HLD, (presumed) COPD.    PT Comments    The pt was agreeable to session, but presents with decline in mobility and cognition from prior PT evaluation on 12/10. The pt continues to present with word-finding issues, in addition to dysarthria and mumbled words when she does speak. The pt also presents with difficulties in following commands, problem solving, and sequencing, all of which needed increased cues and often tactile facilitation of the movement for the pt to complete. The pt also presents with increased assist needed to complete bed mobility, sit-stand transfers, and shorter bout of ambulation in the room. The pt demos increased difficulty with coordination of steps, scissoring often with the LLE, and leaning to the R requiring min-modA to steady. Given the pt's mobility deficits and independence PTA, recommend acute inpatient rehab at d/c to maximize the pt's functional recovery.    Recommendations for follow up therapy are one component of a multi-disciplinary discharge planning process, led by the attending physician.  Recommendations may be updated based on patient status, additional functional criteria and insurance authorization.  Follow Up Recommendations  Acute inpatient rehab (3hours/day)     Assistance Recommended at Discharge Frequent or constant Supervision/Assistance  Equipment Recommendations  Rollator (4 wheels);BSC/3in1    Recommendations for Other Services Rehab consult     Precautions / Restrictions Precautions Precautions:  Fall Restrictions Weight Bearing Restrictions: No     Mobility  Bed Mobility Overal bed mobility: Needs Assistance Bed Mobility: Supine to Sit;Sit to Supine     Supine to sit: Mod assist Sit to supine: Mod assist   General bed mobility comments: modA with max sequential cues.    Transfers Overall transfer level: Needs assistance Equipment used: 1 person hand held assist Transfers: Sit to/from Stand Sit to Stand: Min assist           General transfer comment: significantly increased time to power up, minA to steady    Ambulation/Gait Ambulation/Gait assistance: Min assist;Mod assist Gait Distance (Feet): 30 Feet Assistive device: 1 person hand held assist Gait Pattern/deviations: Step-through pattern;Decreased stride length;Shuffle;Scissoring;Ataxic;Narrow base of support Gait velocity: decreased Gait velocity interpretation: <1.31 ft/sec, indicative of household ambulator   General Gait Details: pt drifting to L and R with frequent scissoring with LLE. VSS on RA, pt with no c/o chest pain once walking.       Balance Overall balance assessment: Needs assistance Sitting-balance support: No upper extremity supported;Feet supported Sitting balance-Leahy Scale: Poor Sitting balance - Comments: pt leaning to R with limited ability to correct until directly cued   Standing balance support: Single extremity supported Standing balance-Leahy Scale: Poor Standing balance comment: dependent on at least single UE support and min-modA                            Cognition Arousal/Alertness: Awake/alert Behavior During Therapy: WFL for tasks assessed/performed Overall Cognitive Status: Impaired/Different from baseline Area of Impairment: Memory;Following commands;Safety/judgement;Problem solving                     Memory: Decreased recall of precautions;Decreased short-term  memory Following Commands: Follows one step commands inconsistently;Follows  one step commands with increased time Safety/Judgement: Decreased awareness of safety;Decreased awareness of deficits   Problem Solving: Decreased initiation General Comments: pt with difficulty following commands for MMT, when asked to kick her RLE out, the pt would make a small step movement, then look at PT and say "right?" it required tactile facilitation of movement before the pt was able to complete the movement when asked. equal difficulty on R and L. continued significant work-finding difficulties. mumbling and dysarthric at times. daughter present and states this is worse        Exercises      General Comments General comments (skin integrity, edema, etc.): VSS on RA      Pertinent Vitals/Pain Pain Assessment: Faces Faces Pain Scale: Hurts whole lot Pain Location: chest, ribs with any movement of trunk Pain Descriptors / Indicators: Sharp Pain Intervention(s): Limited activity within patient's tolerance;Monitored during session;Repositioned     PT Goals (current goals can now be found in the care plan section) Acute Rehab PT Goals Patient Stated Goal: to return home PT Goal Formulation: With patient Time For Goal Achievement: 07/20/21 Potential to Achieve Goals: Good Progress towards PT goals: Progressing toward goals    Frequency    Min 3X/week      PT Plan Discharge plan needs to be updated       AM-PAC PT "6 Clicks" Mobility   Outcome Measure  Help needed turning from your back to your side while in a flat bed without using bedrails?: A Little Help needed moving from lying on your back to sitting on the side of a flat bed without using bedrails?: A Lot Help needed moving to and from a bed to a chair (including a wheelchair)?: A Lot Help needed standing up from a chair using your arms (e.g., wheelchair or bedside chair)?: A Lot Help needed to walk in hospital room?: A Lot Help needed climbing 3-5 steps with a railing? : Total 6 Click Score: 12    End  of Session Equipment Utilized During Treatment: Gait belt Activity Tolerance: Patient tolerated treatment well Patient left: in chair;with call bell/phone within reach;with chair alarm set Nurse Communication: Mobility status PT Visit Diagnosis: Unsteadiness on feet (R26.81);Difficulty in walking, not elsewhere classified (R26.2)     Time: 8828-0034 PT Time Calculation (min) (ACUTE ONLY): 38 min  Charges:  $Gait Training: 8-22 mins $Therapeutic Activity: 23-37 mins                     West Carbo, PT, DPT   Acute Rehabilitation Department Pager #: [phone]   Sandra Cockayne 07/08/2021, 6:12 PM

## 2021-07-08 NOTE — Consult Note (Signed)
Neurology Consultation  Reason for Consult: Intermittent aphasia Referring Physician: Dr. Bonner Puna  CC: "I can't speak"  History is obtained from: Chart review, unable to obtain from patient due to her speech deficit, patient's daughter Ms. [name] via telephone  HPI: Stacey Dorsey is a 87 y.o. female with a medical history significant for ESRD on hemodialysis, poorly controlled hypertension, type 2 diabetes mellitus, hyperlipidemia, and COPD on chronic supplemental oxygen who presented to the ED on 12/8 following a bout of hospital cardiac arrest.  Patient was getting ready to go to dialysis on the morning of 12/8 when she became short of breath.  EMS was activated and found patient to be altered with a subsequent cardiac arrest in PEA s/p 15 minutes of CPR prior to ROSC.  Arrival to the ED patient was intubated and a chest x-ray revealed patient with right sided airspace disease and patient was admitted and started on Rocephin and azithromycin for pneumonia. A CT head was obtained on arrival revealing left frontal lobe encephalomalacia, though family reported that patient had never previously had a stroke. While in the ICU, patient had intermittent agitation and restlessness with presumed delirium. Patient was also noted to have intermittent aphasia on 12/11 into 12/12 with a stable repeat CT head. Due to ongoing concerns for intermittent expressive aphasia, neurology was consulted for further evaluation.   Per patient's daughter, Ms. Allyne Gee, patient was speaking normally yesterday afternoon on 12/11 from 2:30 PM - 7:30 PM speaking on the telephone with family members without noted speech difficulties. Ms. Allyne Gee states that when she left her mother last night at 7:30 PM, she was speaking at her baseline with some disturbance in pitch due to her recent intubation. This morning, Ms. Allyne Gee called her mother on the phone and noted that she was not speaking normally and could not understand what she  was saying. There is no known history of stroke or word-finding difficulties in the past, through, Ms. Pastrana just moved in with her daughter about 1.5 years ago from [place] when she started getting hemodialysis.   LKW: Prior to hospital arrival 12/8  ROS: Unable to obtain due to patient's speech disturbance.   Past Medical History:  Diagnosis Date   Chronic kidney disease    COPD (chronic obstructive pulmonary disease) ([place])    Diabetes mellitus without complication ([place])    Hyperlipidemia    Hypertension    Past Surgical History:  Procedure Laterality Date   ABDOMINAL HYSTERECTOMY     AV FISTULA PLACEMENT     IR REMOVAL TUN CV CATH W/O FL  09/07/2020   History reviewed. No pertinent family history.  Social History:   reports that she has never smoked. She has never used smokeless tobacco. She reports that she does not use drugs. No history on file for alcohol use.  Medications  Current Facility-Administered Medications:    0.9 %  sodium chloride infusion, , Intravenous, PRN, Collene Gobble, MD, Stopped at 07/05/21 1832   acetaminophen (TYLENOL) tablet 650 mg, 650 mg, Oral, Q8H PRN, Collene Gobble, MD, 650 mg at 07/08/21 1034   allopurinol (ZYLOPRIM) tablet 100 mg, 100 mg, Oral, Daily, Byrum, Rose Fillers, MD, 100 mg at 07/08/21 1024   azithromycin (ZITHROMAX) 500 mg in sodium chloride 0.9 % 250 mL IVPB, 500 mg, Intravenous, Q24H, Byrum, Rose Fillers, MD, Stopped at 07/07/21 1121   bisacodyl (DULCOLAX) suppository 10 mg, 10 mg, Rectal, Daily PRN, Collene Gobble, MD, 10 mg at 07/06/21 1735   carvedilol (COREG)  tablet 25 mg, 25 mg, Oral, 2 times per day on Sun Mon Wed Fri, Byrum, Rose Fillers, MD, 25 mg at 07/08/21 1039   cefTRIAXone (ROCEPHIN) 2 g in sodium chloride 0.9 % 100 mL IVPB, 2 g, Intravenous, Daily, Collene Gobble, MD, Last Rate: 200 mL/hr at 07/08/21 1213, 2 g at 07/08/21 1213   Chlorhexidine Gluconate Cloth 2 % PADS 6 each, 6 each, Topical, Daily, Collene Gobble, MD, 6 each at  07/07/21 1334   cloNIDine (CATAPRES) tablet 0.1 mg, 0.1 mg, Oral, BID, Collene Gobble, MD, 0.1 mg at 07/08/21 1024   docusate sodium (COLACE) capsule 100 mg, 100 mg, Oral, BID, Collene Gobble, MD, 100 mg at 07/08/21 1024   docusate sodium (COLACE) capsule 100 mg, 100 mg, Oral, BID PRN, Collene Gobble, MD   feeding supplement (NEPRO CARB STEADY) liquid 237 mL, 237 mL, Oral, BID BM, Collene Gobble, MD, Rate Verify at 07/08/21 0300   heparin injection 5,000 Units, 5,000 Units, Subcutaneous, Q8H, Collene Gobble, MD, 5,000 Units at 07/08/21 8101   hydrALAZINE (APRESOLINE) injection 10 mg, 10 mg, Intravenous, Q6H PRN, Collene Gobble, MD, 10 mg at 07/07/21 2302   HYDROmorphone (DILAUDID) tablet 1 mg, 1 mg, Oral, Q4H PRN, Patrecia Pour, MD   HYDROmorphone (DILAUDID) tablet 2 mg, 2 mg, Oral, Q4H PRN, Patrecia Pour, MD   ibuprofen (ADVIL) tablet 600 mg, 600 mg, Oral, Q6H PRN, Collene Gobble, MD, 600 mg at 07/07/21 0923   insulin aspart (novoLOG) injection 0-9 Units, 0-9 Units, Subcutaneous, Q4H, Byrum, Rose Fillers, MD, 1 Units at 07/08/21 1213   ipratropium-albuterol (DUONEB) 0.5-2.5 (3) MG/3ML nebulizer solution 3 mL, 3 mL, Nebulization, Q4H PRN, Collene Gobble, MD, 3 mL at 07/07/21 7510   labetalol (NORMODYNE) injection 10 mg, 10 mg, Intravenous, Q4H PRN, Collene Gobble, MD, 10 mg at 07/07/21 0236   lactated ringers infusion, , Intravenous, Continuous, Collene Gobble, MD, Held at 07/04/21 1324   melatonin tablet 3 mg, 3 mg, Oral, QHS PRN, Collene Gobble, MD, 3 mg at 07/08/21 2585   multivitamin (RENA-VIT) tablet 1 tablet, 1 tablet, Oral, QHS, Collene Gobble, MD, 1 tablet at 07/07/21 2044   NIFEdipine (PROCARDIA-XL/NIFEDICAL-XL) 24 hr tablet 30 mg, 30 mg, Oral, QHS, Byrum, Rose Fillers, MD, 30 mg at 07/07/21 2251   ondansetron (ZOFRAN) injection 4 mg, 4 mg, Intravenous, Q6H PRN, Byrum, Rose Fillers, MD   pantoprazole (PROTONIX) EC tablet 40 mg, 40 mg, Oral, QHS, Byrum, Rose Fillers, MD, 40 mg at 07/07/21  2044   phenol (CHLORASEPTIC) mouth spray 1 spray, 1 spray, Mouth/Throat, PRN, Collene Gobble, MD, 1 spray at 07/05/21 1408   polyethylene glycol (MIRALAX / GLYCOLAX) packet 17 g, 17 g, Per Tube, Daily PRN, Byrum, Rose Fillers, MD   rOPINIRole (REQUIP) tablet 0.5 mg, 0.5 mg, Oral, QHS, Byrum, Rose Fillers, MD, 0.5 mg at 07/07/21 2251   senna (SENOKOT) tablet 8.6 mg, 1 tablet, Oral, Daily, Byrum, Rose Fillers, MD, 8.6 mg at 07/08/21 1024   simvastatin (ZOCOR) tablet 20 mg, 20 mg, Oral, QHS, Byrum, Rose Fillers, MD, 20 mg at 07/07/21 2044  Exam: Current vital signs: BP 139/72   Pulse 74   Temp 97.8 F (36.6 C) (Oral)   Resp (!) 21   Ht 5\' 9"  (1.753 m)   Wt 65.3 kg   SpO2 96%   BMI 21.26 kg/m  Vital signs in last 24 hours: Temp:  [97.8 F (36.6 C)-98.1 F (  36.7 C)] 97.8 F (36.6 C) (12/11 2252) Pulse Rate:  [73-83] 74 (12/12 1139) Resp:  [14-27] 21 (12/12 1139) BP: (106-183)/(51-90) 139/72 (12/12 1139) SpO2:  [92 %-96 %] 96 % (12/12 1139) Weight:  [65.3 kg] 65.3 kg (12/12 0100)  GENERAL: Awake, alert, becomes frustrated during examination due to her speech disturbance Psych: Affect appropriate for situation, patient is cooperative with examination, though appropriately frustrated with her speech disturbance Head: Normocephalic and atraumatic, without obvious abnormality EENT: Normal conjunctivae, dry mucous membranes, no OP obstruction LUNGS: Normal respiratory effort. Non-labored breathing on nasal cannula  CV: Regular rate and rhythm on telemetry ABDOMEN: Soft, non-tender, non-distended Extremities: warm, well perfused, without obvious deformity  NEURO:  Mental Status: Awake, alert, and oriented to name.  She is unable to answer further orientation questions.  She is unable to provide a clear and coherent history of present illness. Speech/Language: speech is without dysarthria. Naming and repetition are impaired. Fluency is intermittently intact and patient is able to form full sentences  intermittently. She asks "what is wrong with me?" and "I can't speak".  She follows commands without difficulty.  No neglect is noted Cranial Nerves:  II: PERRL. Visual fields full.  III, IV, VI: EOMI without ptosis  V: Sensation is intact to light touch and symmetrical to face. Blinks to threat throughout.  VII: Face is symmetric resting and with movement. VIII: Hearing is intact to voice IX, X: Phonation normal.  XI: Normal sternocleidomastoid and trapezius muscle strength XII: Tongue protrudes midline without fasciculations.   Motor: 5/5 strength is all muscle groups without vertical drift. There is no asymmetry.  Tone is normal. Bulk is normal.  Sensation: Intact to light touch bilaterally in all four extremities.  Gait: Deferred  NIHSS: 1a Level of Conscious.: 0 1b LOC Questions: 2 1c LOC Commands: 0 2 Best Gaze: 0 3 Visual: 0 4 Facial Palsy: 0 5a Motor Arm - left: 0 5b Motor Arm - Right: 0 6a Motor Leg - Left: 0 6b Motor Leg - Right: 0 7 Limb Ataxia: 0 8 Sensory: 0 9 Best Language: 2 10 Dysarthria: 0 11 Extinct. and Inatten.: 0 TOTAL: 4  Labs I have reviewed labs in epic and the results pertinent to this consultation are:\ CBC    Component Value Date/Time   WBC 9.5 07/08/2021 0041   RBC 3.30 (L) 07/08/2021 0041   HGB 9.4 (L) 07/08/2021 0041   HCT 30.9 (L) 07/08/2021 0041   PLT 154 07/08/2021 0041   MCV 93.6 07/08/2021 0041   MCH 28.5 07/08/2021 0041   MCHC 30.4 07/08/2021 0041   RDW 17.0 (H) 07/08/2021 0041   LYMPHSABS 5.1 (H) 12/31/2020 2334   MONOABS 1.1 (H) 12/31/2020 2334   EOSABS 0.4 12/31/2020 2334   BASOSABS 0.1 12/31/2020 2334   CMP     Component Value Date/Time   NA 138 07/08/2021 0041   K 3.8 07/08/2021 0041   CL 101 07/08/2021 0041   CO2 25 07/08/2021 0041   GLUCOSE 90 07/08/2021 0041   BUN 26 (H) 07/08/2021 0041   CREATININE 6.02 (H) 07/08/2021 0041   CALCIUM 8.9 07/08/2021 0041   PROT 5.2 (L) 07/04/2021 1018   ALBUMIN 2.7 (L)  07/04/2021 1018   AST 93 (H) 07/04/2021 1018   ALT 60 (H) 07/04/2021 1018   ALKPHOS 71 07/04/2021 1018   BILITOT 1.2 07/04/2021 1018   GFRNONAA 6 (L) 07/08/2021 0041   Lipid Panel     Component Value Date/Time   TRIG 107 07/05/2021  0510   Lab Results  Component Value Date   HGBA1C 6.1 (H) 07/04/2021   Imaging I have reviewed the images obtained:  CT-scan of the brain 12/12: 1. Stable non contrast CT appearance of the brain. No acute or evolving infarct is identified. 2. Stable chronic appearing left MCA anterior division territory infarct.  MRI examination of the brain pending  Assessment: 87 y.o. female who presented to the hospital 12/8 for evaluation of out-of-hospital cardiac arrest with 15 minutes of CPR prior to ROSC. Patient had presumed delirium while intubated and was extubated on 12/9. Patient has had documented concern of expressive aphasia since 12/11 with CT head imaging without acute intracranial abnormality. Neurology was consulted for further evaluation. - Examination reveals patient with expressive aphasia with naming and repetition impairment. She does have intermittent impairment of fluency as well. She does appear frustrated with her speech throughout assessment.  - CTH scans on 12/8 and 12/11 are without evidence of acute or evolving infarction but with chronic appearing left MCA anterior division territory infarct. Family denies history of stroke in the past. Family reported to bedside RN concern for worsening word-finding difficulties today compared to yesterday.  - Presentation is concerning for acute infarction versus seizures with intermittent word-finding difficulties versus recrudescence of old stroke symptoms though family does not recall any history of stroke or word-finding difficulties (patient only recently has lived with her daughter in the past 1.5 years and prior to that, they lived long-distance). Will obtain MRI brain and routine EEG for further  evaluation.  Recommendations: - MRI brain wo contrast- if acute finding will expand stroke work up  - Routine EEG  - Further recommendations pending initial testing  Anibal Henderson, AGAC-NP [facility] Pager: [phone]  Attending addendum Patient seen and examined Consultation requested by Dr. Bonner Puna for speech disturbance. Patient status post PEA arrest with 15 minutes of CPR prior to ROSC. On examination has significant expressive aphasia. CT head on arrival and 3 days later with left frontal encephalomalacia which does not appear to acute. Family has no history of prior strokes. At this time, I would recommend MRI of the brain-if acute, will do the full stroke work-up, routine EEG. Further recommendations based on the testing above. I have reviewed the imaging personally. Plan was discussed with Dr. Bonner Puna over secure chat.  -- Amie Portland, MD Neurologist [facility] Pager: [phone]

## 2021-07-08 NOTE — Progress Notes (Signed)
At approximately 11 am, pt's daughter called RN and said her mom was not speaking "as clearly as she was yesterday." RN assessed pt and did not notice any change in status from start of shift this am, though pt is having some difficulty retrieving words. Pt notices that her speech is at times difficult to express. RN took vitals, paged MD, and called rapid response. Rapid response RN to bedside. MD called RN. No new orders at this time. RN will continue to monitor pt.

## 2021-07-08 NOTE — Progress Notes (Signed)
Admit: 07/04/2021 LOS: 4  33F ESRD THS s/p cardiac arrest 07/04/21  Subjective: seen in room, having trouble w/ word-finding and memory  12/11 0701 - 12/12 0700 In: 308 [IV Piggyback:308] Out: -   Filed Weights   07/06/21 1115 07/07/21 0354 07/08/21 0100  Weight: 54.5 kg 62.7 kg 65.3 kg    Scheduled Meds:  allopurinol  100 mg Oral Daily   carvedilol  25 mg Oral 2 times per day on Sun Mon Wed Fri   Chlorhexidine Gluconate Cloth  6 each Topical Daily   cloNIDine  0.1 mg Oral BID   docusate sodium  100 mg Oral BID   feeding supplement (NEPRO CARB STEADY)  237 mL Oral BID BM   heparin  5,000 Units Subcutaneous Q8H   insulin aspart  0-9 Units Subcutaneous Q4H   multivitamin  1 tablet Oral QHS   NIFEdipine  30 mg Oral QHS   pantoprazole  40 mg Oral QHS   rOPINIRole  0.5 mg Oral QHS   senna  1 tablet Oral Daily   simvastatin  20 mg Oral QHS   Continuous Infusions:  sodium chloride Stopped (07/05/21 1832)   azithromycin Stopped (07/07/21 1121)   cefTRIAXone (ROCEPHIN)  IV Stopped (07/07/21 1016)   lactated ringers Stopped (07/04/21 1324)   PRN Meds:.sodium chloride, acetaminophen, bisacodyl, docusate sodium, hydrALAZINE, ibuprofen, ipratropium-albuterol, labetalol, melatonin, ondansetron (ZOFRAN) IV, oxyCODONE, phenol, polyethylene glycol  Current Labs: reviewed    Physical Exam:  Blood pressure (!) 133/59, pulse 83, temperature 97.8 F (36.6 C), temperature source Oral, resp. rate 20, height 5\' 9"  (1.753 m), weight 65.3 kg, SpO2 96 %. Intubated, sedated Regular, nl s1s2 Course bs/ bl No LEE +B/T of LUE AVG S/nt/nd   OP HD: [facility]  ? Hrs   64.5kg  2/2 bath  400/500   LUA AVG  Hep none  Sensipar 30 ug tiw po Hectorol 5 ug tiw IV Mircera 75 ug q 2wks , last 12/01  Venofer 50 mg q. weekly    Assessment/ Plan ESRD - TTS [facility] LUE AVG. HD tomorrow.  AMS - word-finding issues today, will d/w pmd PEA arrest - Out of hospital , per CCM VDRF/ Pneumonia - on  ABX HTN - off clevidipine gtt; back on PO BB and CCB, stable Anemia-  stable >10 CKD-BMD- Ca ok, P ok   Kelly Splinter, MD 07/08/2021, 11:32 AM      Recent Labs  Lab 07/06/21 0026 07/07/21 0233 07/08/21 0041  NA 138 138 138  K 3.5 3.9 3.8  CL 103 101 101  CO2 23 27 25   GLUCOSE 106* 113* 90  BUN 28* 17 26*  CREATININE 5.85* 4.41* 6.02*  CALCIUM 8.6* 8.8* 8.9  PHOS 3.7  --   --     Recent Labs  Lab 07/06/21 0026 07/07/21 0233 07/08/21 0041  WBC 10.3 9.8 9.5  HGB 9.4* 9.0* 9.4*  HCT 29.7* 29.6* 30.9*  MCV 91.7 93.1 93.6  PLT 130* 123* 154

## 2021-07-08 NOTE — Progress Notes (Signed)
EEG complete - results pending 

## 2021-07-08 NOTE — Progress Notes (Signed)
PROGRESS NOTE  Stacey Dorsey  WEX:[id number] DOB: 1933/05/10 DOA: 07/04/2021 PCP: Mindi Curling, PA-C   Brief Narrative: Stacey Dorsey is an 87 yo F hx ESRD on HD, poorly controlled HTN, T2DM, HLD, (presumed) COPD on chronic supplemental oxygen who presented to [facility] 12/8 following cardiac arrest. Patient was in usual state of health until 12/8 morning when she felt short of breath while getting ready to go to dialysis. EMS was called, on EMS arrival pt was altered and subsequently sustained witnessed cardiac arrest -- PEA. Received 15 min CPR, 1 epi, 1g calcium, 50mg  bicarb, as well as 191mcg fent and  5mg  versed. King placed in field which was exchanged for ETT in ED. CXR revealed right-sided airspace disease. Opacities confirmed on CTA chest which showed no PE, suggested volume overload, and multiple bilateral rib fractures. CT head showed only encephalomalacia from old anterior MCA-territory stroke. Started on rocephin and azithromycin for pneumonia. Echocardiogram revealed LVEF 50-55%, G1DD, normal RV size and function, severely elevated PASP with estimated RVSP of 61.61mmHg. Volume status has been regulated by hemodialysis. Ultimately was extubated 12/9 and transferred out of ICU 12/11. CT head on that date due to memory difficulties was stable from admission. Due to persistence of symptoms, MRI brain is ordered, neurology consulted.  Assessment & Plan: Principal Problem:   Cardiac arrest [facility]) Active Problems:   Malnutrition of moderate degree  OOH PEA cardiac arrest: ROSC after 15 minutes. With asymmetric infiltrates, preceding dyspnea and preserved LVEF, pneumonia/hypoxia is presumptive trigger.  - Continue cardiac monitoring.   Rib fractures:  - Continue pain medications as below - Pulmonary hygiene.   Acute metabolic encephalopathy: Suspected to be delirium related to ICU/critical illness and reportedly improving, though with some verbal blocking. - CT head suggests old anterior left  MCA stroke. With 15 minutes prior to ROSC, some watershed hypoperfusion could be expected which would reasonably be expected to impact verbal functioning. Checking brain MRI.  - Neurology, Dr. Rory Percy, consulted.  - Ammonia was 32. No asterixis or lethargy to suggest hepatic encephalopathy. No other suggestion of uremia. - Delirium precautions - Change oxycodone prn to hydromorphone prn due to ESRD.  Acute on chronic hypoxic respiratory failure: Due to right-sided CAP vs. aspiration pneumonia - Continue antibiotics x7 days per PCCM - Continue aggressive pulmonary hygiene - Consider outpatient PFTs/pulmonary follow up.  ESRD:  - Continue HD TTS per nephrology  HTN:  - Continue coreg, nifedipine. Clonidine was added, will monitor BP trends, avoid hypotension  T2DM: HbA1c 6.1%. Would not attempt too rigid of control to avoid neuroglycopenia.  - SSI   AOCKD and iron deficiency anemia: Stable - Per nephrology.   Gout: Chronic, quiescent.  - Continue allopurinol.  RLS:  - Continue ropinirole  HLD:  - Continue statin  Moderate protein-calorie malnutrition:  - MVM, protein supplementation  DVT prophylaxis: Heparin  Code Status: Full Family Communication: None at bedside Disposition Plan:  Status is: Inpatient  Remains inpatient appropriate because: AMS, neurological deficit.  Consultants:  PCCM Neurology  Procedures:  Echo  Antimicrobials: Ceftriaxone, azithromycin   Subjective: Has been finding it more difficult to get words out that she means to say. Not saying wrong words or having trouble forming the words. No trouble with people understanding her speech, just blocking words very frequently which is not her baseline. Started yesterday, getting worse. No numbness or weakness any where. Has severe chest pain with deep breathing.  Objective: Vitals:   07/08/21 0100 07/08/21 0756 07/08/21 1024 07/08/21 1139  BP:  (!) 132/54 (!) 133/59 139/72  Pulse:  83  74  Resp:   20  (!) 21  Temp:      TempSrc:      SpO2:  96%  96%  Weight: 65.3 kg     Height:        Intake/Output Summary (Last 24 hours) at 07/08/2021 1251 Last data filed at 07/08/2021 0300 Gross per 24 hour  Intake 308 ml  Output --  Net 308 ml   Filed Weights   07/06/21 1115 07/07/21 0354 07/08/21 0100  Weight: 54.5 kg 62.7 kg 65.3 kg    Gen: 87 y.o. female in no distress Pulm: Non-labored breathing supplemental oxygen, diminished without wheezes.  CV: Regular rate and rhythm. No murmur, rub, or gallop. No JVD, minimal pedal edema. Chest wall tender to palpation bilaterally GI: Abdomen soft, non-tender, non-distended, with normoactive bowel sounds. No organomegaly or masses felt. Ext: Warm, no deformities Skin: No rashes, lesions or ulcers on visualized skin Neuro: Alert and oriented. Strength and sensation in cranial and peripheral nerves appears intact. No dysarthria or word salad, but comprehensible speech is stuttering with long deliberation. Psych: Judgement and insight appear normal. Mood & affect appropriate.   Data Reviewed: I have personally reviewed following labs and imaging studies  CBC: Recent Labs  Lab 07/04/21 1018 07/04/21 1234 07/05/21 0455 07/05/21 0716 07/06/21 0026 07/07/21 0233 07/08/21 0041  WBC 13.7*  --   --  8.9 10.3 9.8 9.5  HGB 10.6*   < > 11.6* 10.4* 9.4* 9.0* 9.4*  HCT 34.0*   < > 34.0* 33.5* 29.7* 29.6* 30.9*  MCV 98.6  --   --  93.1 91.7 93.1 93.6  PLT 126*  --   --  114* 130* 123* 154   < > = values in this interval not displayed.   Basic Metabolic Panel: Recent Labs  Lab 07/04/21 1018 07/04/21 1234 07/05/21 0455 07/05/21 0510 07/06/21 0026 07/07/21 0233 07/08/21 0041  NA 134*   < > 141 140 138 138 138  K 4.4   < > 3.1* 3.2* 3.5 3.9 3.8  CL 100  --   --  101 103 101 101  CO2 19*  --   --  24 23 27 25   GLUCOSE 205*  --   --  109* 106* 113* 90  BUN 28*  --   --  22 28* 17 26*  CREATININE 5.84*  --   --  4.91* 5.85* 4.41* 6.02*   CALCIUM 9.0  --   --  8.8* 8.6* 8.8* 8.9  MG 2.0  --   --  1.9 2.1 2.0 2.2  PHOS  --   --   --   --  3.7  --   --    < > = values in this interval not displayed.   GFR: Estimated Creatinine Clearance: 6.9 mL/min (A) (by C-G formula based on SCr of 6.02 mg/dL (H)). Liver Function Tests: Recent Labs  Lab 07/04/21 1018  AST 93*  ALT 60*  ALKPHOS 71  BILITOT 1.2  PROT 5.2*  ALBUMIN 2.7*   No results for input(s): LIPASE, AMYLASE in the last 168 hours. Recent Labs  Lab 07/04/21 1018 07/05/21 0510  AMMONIA 68* 32   Coagulation Profile: No results for input(s): INR, PROTIME in the last 168 hours. Cardiac Enzymes: No results for input(s): CKTOTAL, CKMB, CKMBINDEX, TROPONINI in the last 168 hours. BNP (last 3 results) No results for input(s): PROBNP in the last 8760  hours. HbA1C: No results for input(s): HGBA1C in the last 72 hours. CBG: Recent Labs  Lab 07/07/21 1918 07/08/21 0052 07/08/21 0423 07/08/21 0755 07/08/21 1137  GLUCAP 123* 97 119* 92 137*   Lipid Profile: No results for input(s): CHOL, HDL, LDLCALC, TRIG, CHOLHDL, LDLDIRECT in the last 72 hours. Thyroid Function Tests: No results for input(s): TSH, T4TOTAL, FREET4, T3FREE, THYROIDAB in the last 72 hours. Anemia Panel: No results for input(s): VITAMINB12, FOLATE, FERRITIN, TIBC, IRON, RETICCTPCT in the last 72 hours. Urine analysis:    Component Value Date/Time   COLORURINE YELLOW 07/04/2021 1205   APPEARANCEUR HAZY (A) 07/04/2021 1205   LABSPEC 1.015 07/04/2021 1205   PHURINE 8.5 (H) 07/04/2021 1205   GLUCOSEU >=500 (A) 07/04/2021 1205   HGBUR SMALL (A) 07/04/2021 1205   BILIRUBINUR NEGATIVE 07/04/2021 1205   [place] 07/04/2021 1205   PROTEINUR 100 (A) 07/04/2021 1205   NITRITE NEGATIVE 07/04/2021 1205   LEUKOCYTESUR NEGATIVE 07/04/2021 1205   Recent Results (from the past 240 hour(s))  Resp Panel by RT-PCR (Flu A&B, Covid) Nasopharyngeal Swab     Status: None   Collection Time:  07/04/21  9:41 AM   Specimen: Nasopharyngeal Swab; Nasopharyngeal(NP) swabs in vial transport medium  Result Value Ref Range Status   SARS Coronavirus 2 by RT PCR NEGATIVE NEGATIVE Final    Comment: (NOTE) SARS-CoV-2 target nucleic acids are NOT DETECTED.  The SARS-CoV-2 RNA is generally detectable in upper respiratory specimens during the acute phase of infection. The lowest concentration of SARS-CoV-2 viral copies this assay can detect is 138 copies/mL. A negative result does not preclude SARS-Cov-2 infection and should not be used as the sole basis for treatment or other patient management decisions. A negative result may occur with  improper specimen collection/handling, submission of specimen other than nasopharyngeal swab, presence of viral mutation(s) within the areas targeted by this assay, and inadequate number of viral copies(<138 copies/mL). A negative result must be combined with clinical observations, patient history, and epidemiological information. The expected result is Negative.  Fact Sheet for Patients:  EntrepreneurPulse.com.au  Fact Sheet for Healthcare Providers:  IncredibleEmployment.be  This test is no t yet approved or cleared by the [place] FDA and  has been authorized for detection and/or diagnosis of SARS-CoV-2 by FDA under an Emergency Use Authorization (EUA). This EUA will remain  in effect (meaning this test can be used) for the duration of the COVID-19 declaration under Section 564(b)(1) of the Act, 21 U.S.C.section 360bbb-3(b)(1), unless the authorization is terminated  or revoked sooner.       Influenza A by PCR NEGATIVE NEGATIVE Final   Influenza B by PCR NEGATIVE NEGATIVE Final    Comment: (NOTE) The Xpert Xpress SARS-CoV-2/FLU/RSV plus assay is intended as an aid in the diagnosis of influenza from Nasopharyngeal swab specimens and should not be used as a sole basis for treatment. Nasal washings  and aspirates are unacceptable for Xpert Xpress SARS-CoV-2/FLU/RSV testing.  Fact Sheet for Patients: EntrepreneurPulse.com.au  Fact Sheet for Healthcare Providers: IncredibleEmployment.be  This test is not yet approved or cleared by the [place] FDA and has been authorized for detection and/or diagnosis of SARS-CoV-2 by FDA under an Emergency Use Authorization (EUA). This EUA will remain in effect (meaning this test can be used) for the duration of the COVID-19 declaration under Section 564(b)(1) of the Act, 21 U.S.C. section 360bbb-3(b)(1), unless the authorization is terminated or revoked.  Performed at [facility], [place] [address], [place], [place]  [address]   Culture, blood (routine x 2)     Status: None (Preliminary result)   Collection Time: 07/04/21 11:23 AM   Specimen: Right Antecubital; Blood  Result Value Ref Range Status   Specimen Description RIGHT ANTECUBITAL  Final   Special Requests BOTTLES DRAWN AEROBIC ONLY  Final   Culture   Final    NO GROWTH 4 DAYS Performed at [facility], [address] [place], [place] [address]    Report Status PENDING  Incomplete  Culture, blood (routine x 2)     Status: None (Preliminary result)   Collection Time: 07/04/21 11:23 AM   Specimen: Right Antecubital; Blood  Result Value Ref Range Status   Specimen Description RIGHT ANTECUBITAL  Final   Special Requests ANAEROBIC  Final   Culture   Final    NO GROWTH 4 DAYS Performed at [facility], [place] [address], [place], [place] [address]    Report Status PENDING  Incomplete  Respiratory (~20 pathogens) panel by PCR     Status: None   Collection Time: 07/04/21  1:15 PM   Specimen: Nasopharyngeal Swab; Respiratory  Result Value Ref Range Status   Adenovirus NOT DETECTED NOT DETECTED Final   Coronavirus 229E NOT DETECTED NOT DETECTED Final    Comment: (NOTE) The Coronavirus on the Respiratory Panel, DOES NOT test for  the novel  Coronavirus (2019 nCoV)    Coronavirus HKU1 NOT DETECTED NOT DETECTED Final   Coronavirus NL63 NOT DETECTED NOT DETECTED Final   Coronavirus OC43 NOT DETECTED NOT DETECTED Final   Metapneumovirus NOT DETECTED NOT DETECTED Final   Rhinovirus / Enterovirus NOT DETECTED NOT DETECTED Final   Influenza A NOT DETECTED NOT DETECTED Final   Influenza B NOT DETECTED NOT DETECTED Final   Parainfluenza Virus 1 NOT DETECTED NOT DETECTED Final   Parainfluenza Virus 2 NOT DETECTED NOT DETECTED Final   Parainfluenza Virus 3 NOT DETECTED NOT DETECTED Final   Parainfluenza Virus 4 NOT DETECTED NOT DETECTED Final   Respiratory Syncytial Virus NOT DETECTED NOT DETECTED Final   Bordetella pertussis NOT DETECTED NOT DETECTED Final   Bordetella Parapertussis NOT DETECTED NOT DETECTED Final   Chlamydophila pneumoniae NOT DETECTED NOT DETECTED Final   Mycoplasma pneumoniae NOT DETECTED NOT DETECTED Final    Comment: Performed at [facility], [place]. [address] [place], [place] [address]  MRSA Next Gen by PCR, Nasal     Status: None   Collection Time: 07/04/21  4:32 PM   Specimen: Nasal Mucosa; Nasal Swab  Result Value Ref Range Status   MRSA by PCR Next Gen NOT DETECTED NOT DETECTED Final    Comment: (NOTE) The GeneXpert MRSA Assay (FDA approved for NASAL specimens only), is one component of a comprehensive MRSA colonization surveillance program. It is not intended to diagnose MRSA infection nor to guide or monitor treatment for MRSA infections. Test performance is not FDA approved in patients less than 63 years old. Performed at [facility], [place] [address] [place], [place] [address]       Radiology Studies: CT HEAD WO CONTRAST (5MM)  Result Date: 07/07/2021 CLINICAL DATA:  87 year old female TIA. EXAM: CT HEAD WITHOUT CONTRAST TECHNIQUE: Contiguous axial images were obtained from the base of the skull through the vertex without intravenous contrast. COMPARISON:  Head CT  07/04/2021. FINDINGS: Brain: Stable bilateral basal ganglia vascular calcifications. Stable encephalomalacia in the left middle frontal gyrus along the anterior insula and operculum. Elsewhere gray-white matter differentiation  remains normal for age. No superimposed midline shift, ventriculomegaly, mass effect, evidence of mass lesion, intracranial hemorrhage or evidence of cortically based acute infarction. Vascular: Calcified atherosclerosis at the skull base. No suspicious intracranial vascular hyperdensity. Skull: Questionable chronic left orbital floor fracture. No acute osseous abnormality identified. Sinuses/Orbits: Visualized paranasal sinuses and mastoids are stable and well aerated. Other: No acute orbit or scalp soft tissue finding. IMPRESSION: 1. Stable non contrast CT appearance of the brain. No acute or evolving infarct is identified. 2. Stable chronic appearing left MCA anterior division territory infarct. Electronically Signed   By: Genevie Ann M.D.   On: 07/07/2021 11:56    Scheduled Meds:  allopurinol  100 mg Oral Daily   carvedilol  25 mg Oral 2 times per day on Sun Mon Wed Fri   Chlorhexidine Gluconate Cloth  6 each Topical Daily   cloNIDine  0.1 mg Oral BID   docusate sodium  100 mg Oral BID   feeding supplement (NEPRO CARB STEADY)  237 mL Oral BID BM   heparin  5,000 Units Subcutaneous Q8H   insulin aspart  0-9 Units Subcutaneous Q4H   multivitamin  1 tablet Oral QHS   NIFEdipine  30 mg Oral QHS   pantoprazole  40 mg Oral QHS   rOPINIRole  0.5 mg Oral QHS   senna  1 tablet Oral Daily   simvastatin  20 mg Oral QHS   Continuous Infusions:  sodium chloride Stopped (07/05/21 1832)   azithromycin Stopped (07/07/21 1121)   cefTRIAXone (ROCEPHIN)  IV 2 g (07/08/21 1213)   lactated ringers Stopped (07/04/21 1324)     LOS: 4 days   Time spent: 35 minutes.  Patrecia Pour, MD [facility] [email or web address] 07/08/2021, 12:51 PM

## 2021-07-08 NOTE — Significant Event (Signed)
Rapid Response Event Note   Reason for Call :  Difficulty with speech/word finding  Initial Focused Assessment:  Patient is sitting up in the chair.  She is alert.  No distress. She is having trouble with word finding.  She occasionally speaks clearly and other times can't find her words and has mild dysarthria.  Her last known well is unclear, over the past 24 hours there is documentation of her being delirious and occasionally having difficulty finding her words.  Per RN she has had difficulty with speech and word finding since she met her at shift change.   NIHSS 4, missed both questions, mild aphasia, mild dysarthria.  BP 139/72  SR 70s  RR 18-22  O2 sat 96% on RA  CT done yesterday for similar symptoms   Interventions:    Plan of Care:  MRI   Event Summary:   MD Notified: Bonner Puna Call Time: [place] Time: [id number] End Time: [place]  Raliegh Ip, RN

## 2021-07-09 ENCOUNTER — Inpatient Hospital Stay (HOSPITAL_COMMUNITY): Payer: Medicare Other

## 2021-07-09 DIAGNOSIS — I63412 Cerebral infarction due to embolism of left middle cerebral artery: Secondary | ICD-10-CM

## 2021-07-09 DIAGNOSIS — I639 Cerebral infarction, unspecified: Secondary | ICD-10-CM

## 2021-07-09 DIAGNOSIS — I6340 Cerebral infarction due to embolism of unspecified cerebral artery: Secondary | ICD-10-CM | POA: Insufficient documentation

## 2021-07-09 LAB — CULTURE, BLOOD (ROUTINE X 2)
Culture: NO GROWTH
Culture: NO GROWTH

## 2021-07-09 LAB — RENAL FUNCTION PANEL
Albumin: 2.8 g/dL — ABNORMAL LOW (ref 3.5–5.0)
Anion gap: 9 (ref 5–15)
BUN: 32 mg/dL — ABNORMAL HIGH (ref 8–23)
CO2: 26 mmol/L (ref 22–32)
Calcium: 8.7 mg/dL — ABNORMAL LOW (ref 8.9–10.3)
Chloride: 104 mmol/L (ref 98–111)
Creatinine, Ser: 6.48 mg/dL — ABNORMAL HIGH (ref 0.44–1.00)
GFR, Estimated: 6 mL/min — ABNORMAL LOW (ref >60)
Glucose, Bld: 95 mg/dL (ref 70–99)
Phosphorus: 3.5 mg/dL (ref 2.5–4.6)
Potassium: 3.5 mmol/L (ref 3.5–5.1)
Sodium: 139 mmol/L (ref 135–145)

## 2021-07-09 LAB — GLUCOSE, CAPILLARY
Glucose-Capillary: 113 mg/dL — ABNORMAL HIGH (ref 70–99)
Glucose-Capillary: 116 mg/dL — ABNORMAL HIGH (ref 70–99)
Glucose-Capillary: 152 mg/dL — ABNORMAL HIGH (ref 70–99)
Glucose-Capillary: 96 mg/dL (ref 70–99)

## 2021-07-09 LAB — CBC
HCT: 28.1 % — ABNORMAL LOW (ref 36.0–46.0)
Hemoglobin: 8.8 g/dL — ABNORMAL LOW (ref 12.0–15.0)
MCH: 29.1 pg (ref 26.0–34.0)
MCHC: 31.3 g/dL (ref 30.0–36.0)
MCV: 93 fL (ref 80.0–100.0)
Platelets: 176 10*3/uL (ref 150–400)
RBC: 3.02 MIL/uL — ABNORMAL LOW (ref 3.87–5.11)
RDW: 17.2 % — ABNORMAL HIGH (ref 11.5–15.5)
WBC: 6 10*3/uL (ref 4.0–10.5)
nRBC: 0 % (ref 0.0–0.2)

## 2021-07-09 IMAGING — MR MR MRA HEAD W/O CM
1 series · 19 of 48 positions shown · non-contrast
Comparison: MRI [DATE]

CLINICAL DATA: Stroke follow-up

EXAM:
MRA HEAD WITHOUT CONTRAST
TECHNIQUE: Angiographic images of the [place] were acquired using MRA
technique without intravenous contrast.

[Series 3: (id) mt fs · axial · 1.4mm · 0.43mm/px · z∈[-46,+48]mm · 19 of 144 slices shown]
[im 1/144]
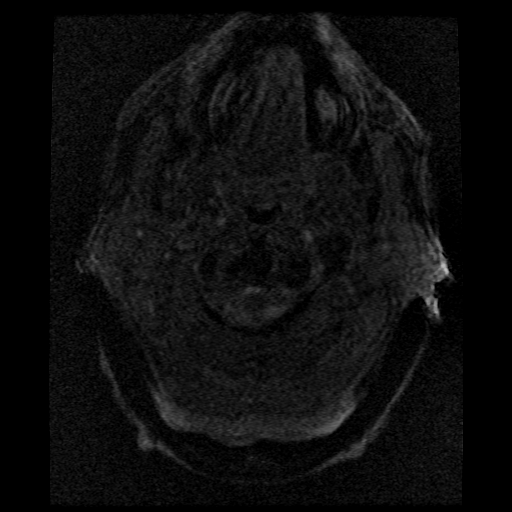
[im 4/144]
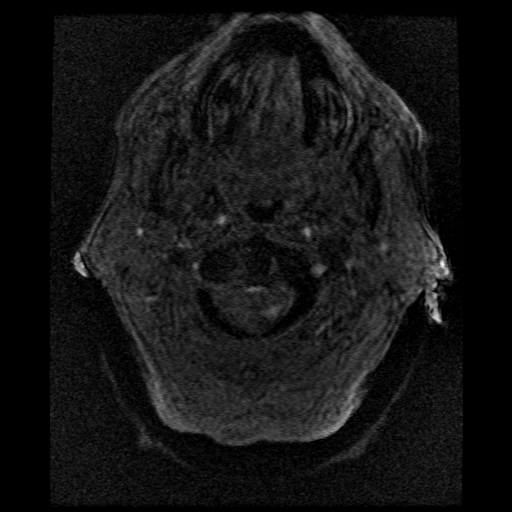
[im 7/144]
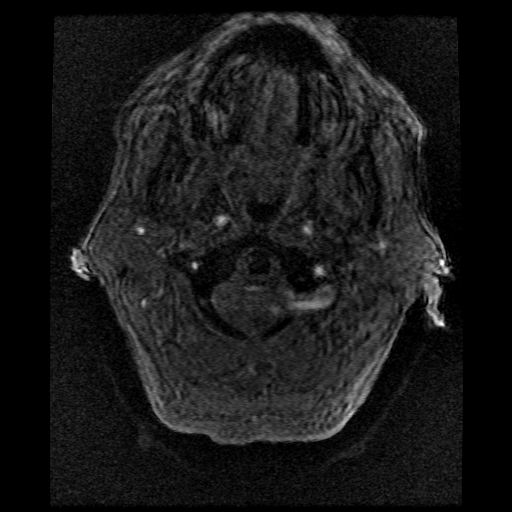
[im 10/144]
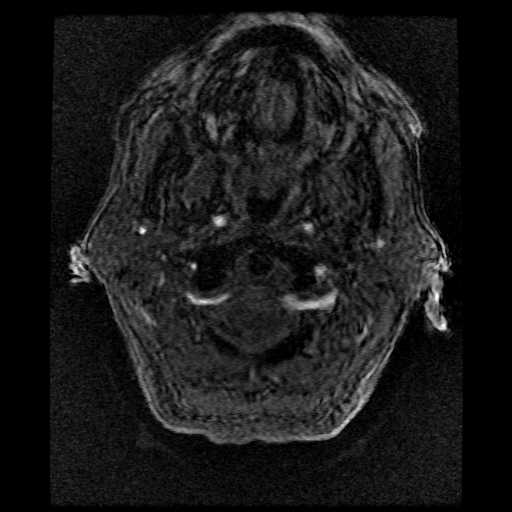
[im 13/144]
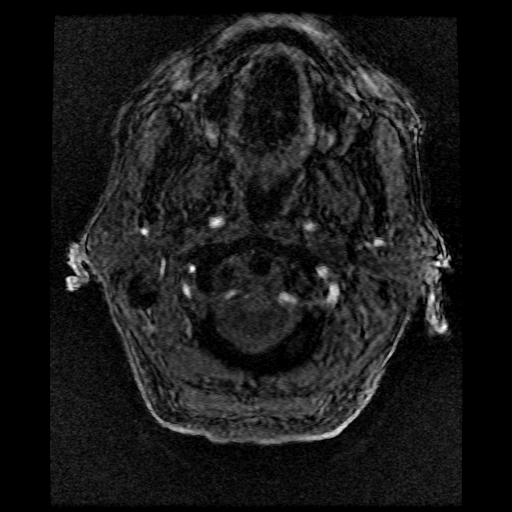
[im 16/144]
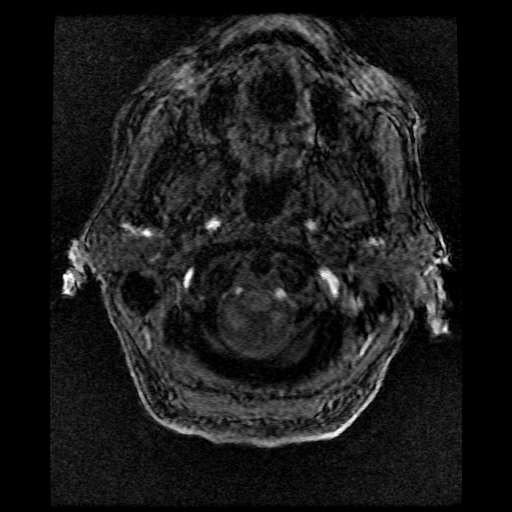
[im 19/144]
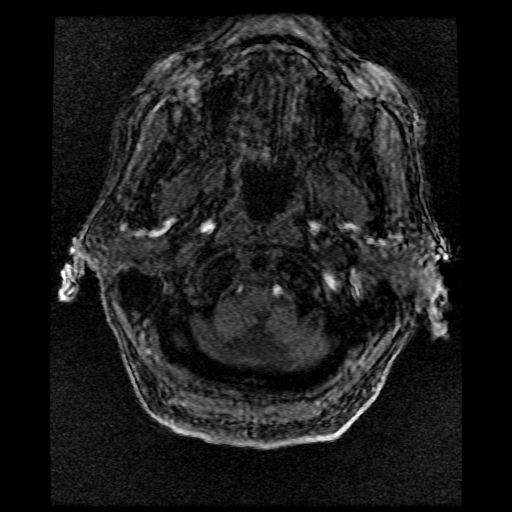
[im 22/144]
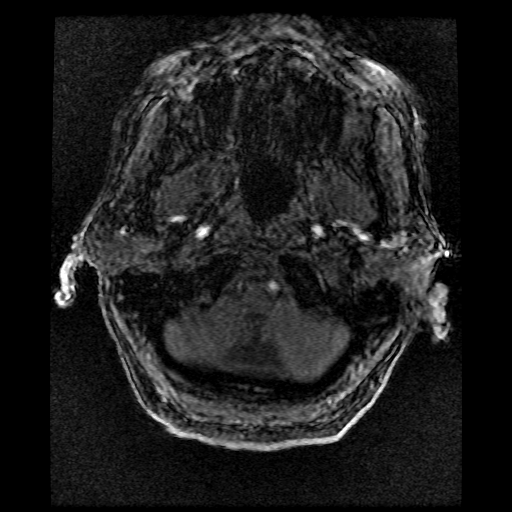
[im 25/144]
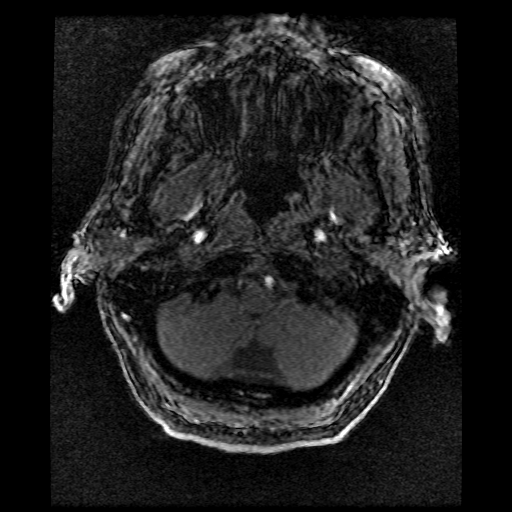
[im 28/144]
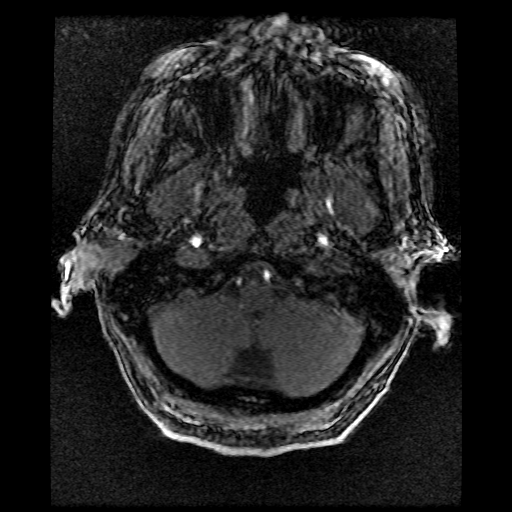
[im 31/144]
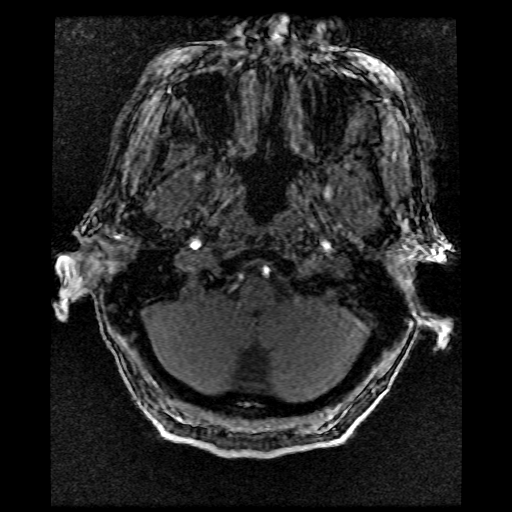
[im 46/144]
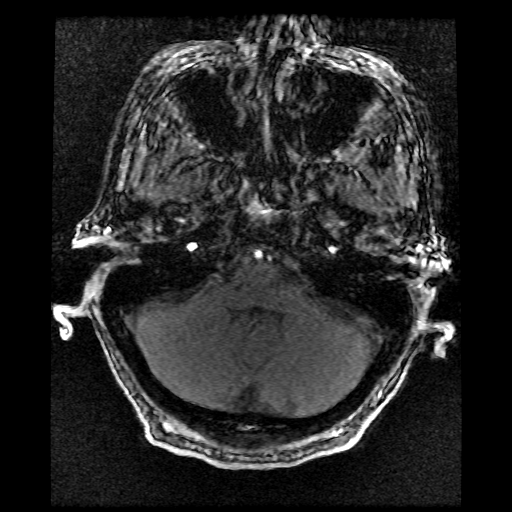
[im 64/144]
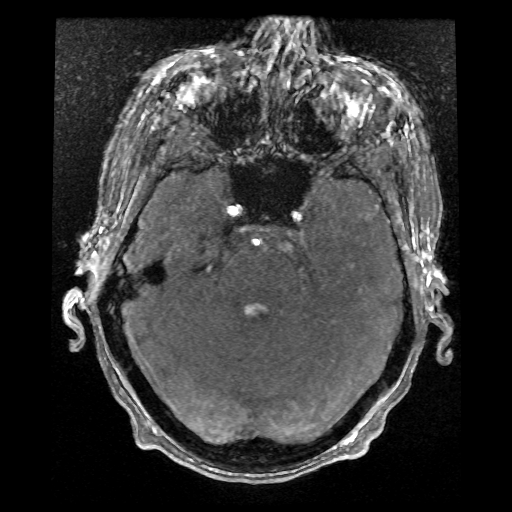
[im 74/144]
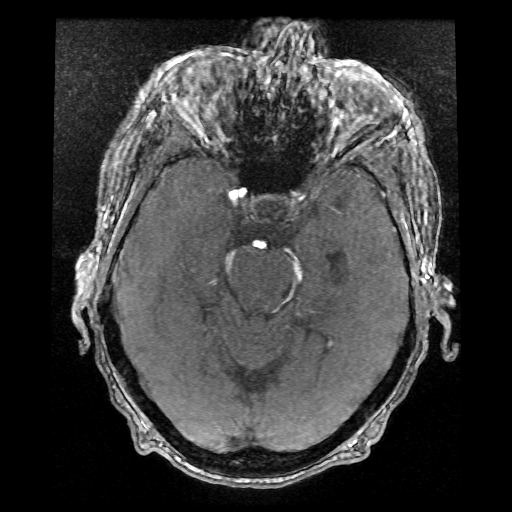
[im 83/144]
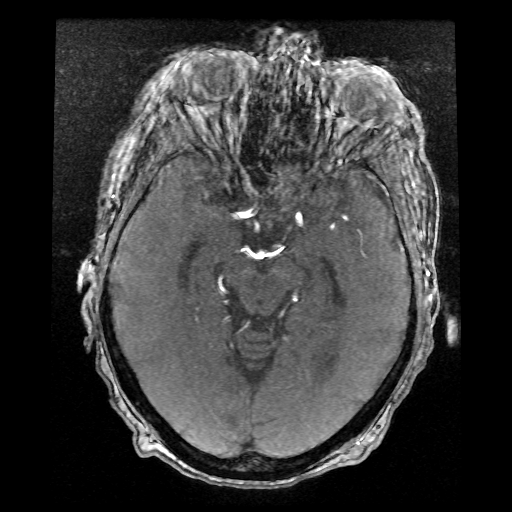
[im 101/144]
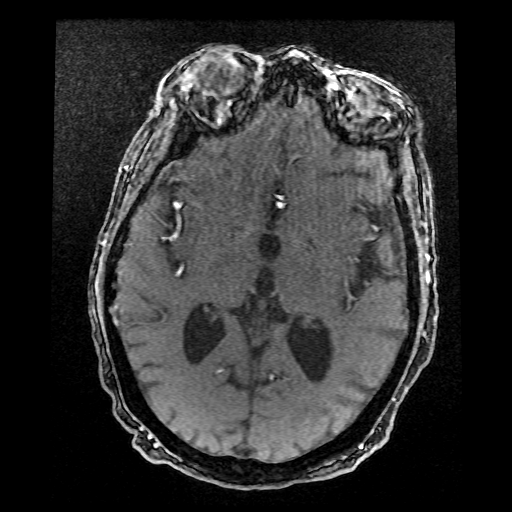
[im 119/144]
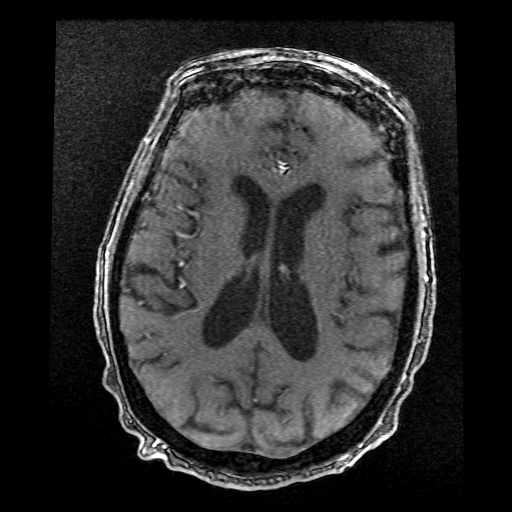
[im 122/144]
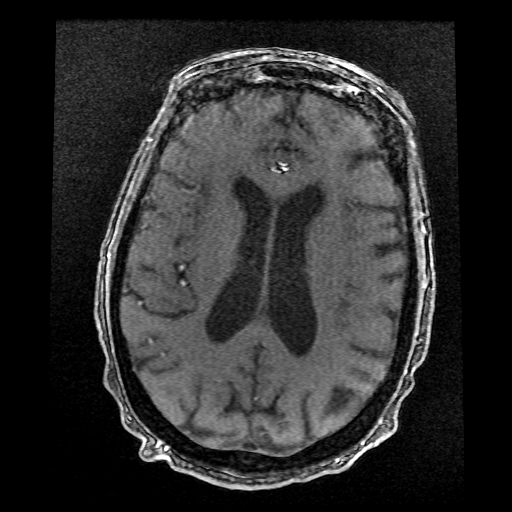
[im 137/144]
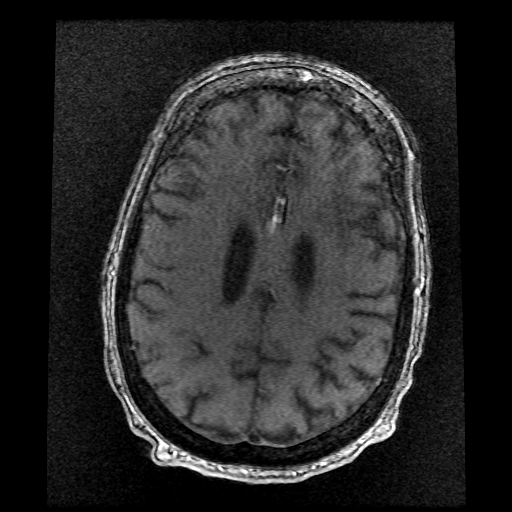

[19 of 48 positions shown; findings below may reference images not displayed]

FINDINGS: Evaluation is somewhat limited by motion artifact.

Anterior circulation: Both internal carotid arteries are patent to
the termini, although there is moderate to severe narrowing in the
left supraclinoid portion (series 3, image 75).

Hypoplastic or aplastic left A1. Normal right A1. Normal anterior
communicating artery. Anterior cerebral arteries are patent to their
distal aspects.

No M1 stenosis or occlusion. Normal MCA bifurcations. Distal MCA
branches perfused, although there is decreased perfusion in the left
distal MCA branches, without focal stenosis or occlusion.

Posterior circulation: Multifocal irregularity in the right
vertebral artery, which appears occluded just proximal to the
vertebrobasilar junction. The left vertebral artery is dominant and
patent to the vertebrobasilar junction without stenosis.

Basilar patent to its distal aspect. Superior cerebellar arteries
patent bilaterally. PCAs perfused to their distal aspects without
stenosis. The right posterior communicating artery is patent. The
left posterior communicating artery is not definitively seen.

Anatomic variants: None significant

Other: None.
IMPRESSION: 1. Evaluation is limited by motion artifact. Within this limitation,
there is moderate to severe narrowing in the left supraclinoid ICA.
Decreased perfusion in the left distal MCA branches, without focal
MCA stenosis or occlusion.
2. Multifocal narrowing of the right vertebral artery, which appears
occluded just proximal to the vertebrobasilar junction.

## 2021-07-09 MED ORDER — ASPIRIN 300 MG RE SUPP
300.0000 mg | Freq: Every day | RECTAL | Status: DC
  Filled 2021-07-09: qty 1

## 2021-07-09 MED ORDER — ASPIRIN EC 325 MG PO TBEC
325.0000 mg | DELAYED_RELEASE_TABLET | Freq: Every day | ORAL | Status: DC
  Filled 2021-07-09: qty 1

## 2021-07-09 MED ORDER — INSULIN ASPART 100 UNIT/ML IJ SOLN
0.0000 [IU] | Freq: Three times a day (TID) | INTRAMUSCULAR | Status: DC
  Administered 2021-07-10: 13:00:00 2 [IU] via SUBCUTANEOUS
  Administered 2021-07-10 – 2021-07-11 (×2): 1 [IU] via SUBCUTANEOUS
  Administered 2021-07-11: 3 [IU] via SUBCUTANEOUS
  Administered 2021-07-12: 2 [IU] via SUBCUTANEOUS
  Administered 2021-07-14: 13:00:00 5 [IU] via SUBCUTANEOUS

## 2021-07-09 MED ORDER — CLOPIDOGREL BISULFATE 75 MG PO TABS
75.0000 mg | ORAL_TABLET | Freq: Every day | ORAL | Status: DC
  Administered 2021-07-09 – 2021-07-16 (×7): 75 mg via ORAL
  Filled 2021-07-09 (×7): qty 1

## 2021-07-09 MED ORDER — ASPIRIN EC 81 MG PO TBEC
81.0000 mg | DELAYED_RELEASE_TABLET | Freq: Every day | ORAL | Status: DC
  Administered 2021-07-09 – 2021-07-10 (×2): 81 mg via ORAL
  Filled 2021-07-09 (×2): qty 1

## 2021-07-09 MED ORDER — STROKE: EARLY STAGES OF RECOVERY BOOK
Freq: Once | Status: DC
  Filled 2021-07-09: qty 1

## 2021-07-09 NOTE — Progress Notes (Addendum)
Admit: 07/04/2021 LOS: 5  85F ESRD THS s/p cardiac arrest 07/04/21  Subjective: seen on HD, no new c/o's  Physical Exam:  Blood pressure (!) 160/62, pulse 64, temperature 98.9 F (37.2 C), resp. rate 15, height 5\' 9"  (1.753 m), weight 63.6 kg, SpO2 99 %. Alert, pleasant, word-finding problems  Regular, nl s1s2 Course bs/ bl No LEE LUE AVG +bruit   OP HD: [facility] TTS  3.5h   64.5kg  2/2 bath  400/500   LUA AVG  Hep none  Sensipar 30 ug tiw po Hectorol 5 ug tiw IV Mircera 75 ug q 2wks , last 12/01  Venofer 50 mg q. weekly    Assessment/ Plan ESRD - TTS HD. HD today.   Acute CVA - L MCA by CT and MRI, neuro following PEA arrest - Out of hospital , per CCM Pneumonia/ resp failure- off of vent, getting IV ABX, improving HTN - off clevidipine gtt; back on PO BB and CCB, clonidine added Anemia-  stable >10 CKD-BMD- Ca ok, P ok   Rob Jonnie Finner, MD 07/09/2021, 11:33 AM     Recent Labs  Lab 07/06/21 0026 07/07/21 0233 07/08/21 0041 07/09/21 0800  NA 138 138 138 139  K 3.5 3.9 3.8 3.5  CL 103 101 101 104  CO2 23 27 25 26   GLUCOSE 106* 113* 90 95  BUN 28* 17 26* 32*  CREATININE 5.85* 4.41* 6.02* 6.48*  CALCIUM 8.6* 8.8* 8.9 8.7*  PHOS 3.7  --   --  3.5    Recent Labs  Lab 07/07/21 0233 07/08/21 0041 07/09/21 0802  WBC 9.8 9.5 6.0  HGB 9.0* 9.4* 8.8*  HCT 29.6* 30.9* 28.1*  MCV 93.1 93.6 93.0  PLT 123* 154 176   Scheduled Meds:   stroke: mapping our early stages of recovery book   Does not apply Once   allopurinol  100 mg Oral Daily   aspirin EC  325 mg Oral Daily   Or   aspirin  300 mg Rectal Daily   carvedilol  25 mg Oral 2 times per day on Sun Mon Wed Fri   Chlorhexidine Gluconate Cloth  6 each Topical Daily   cloNIDine  0.1 mg Oral BID   docusate sodium  100 mg Oral BID   feeding supplement (NEPRO CARB STEADY)  237 mL Oral BID BM   heparin  5,000 Units Subcutaneous Q8H   insulin aspart  0-9 Units Subcutaneous Q4H   multivitamin  1 tablet Oral QHS    NIFEdipine  30 mg Oral QHS   pantoprazole  40 mg Oral QHS   rOPINIRole  0.5 mg Oral QHS   senna  1 tablet Oral Daily   simvastatin  20 mg Oral QHS   Continuous Infusions:  sodium chloride Stopped (07/05/21 1832)   cefTRIAXone (ROCEPHIN)  IV 2 g (07/08/21 1213)   lactated ringers Stopped (07/04/21 1324)   PRN Meds:.sodium chloride, acetaminophen, bisacodyl, docusate sodium, hydrALAZINE, HYDROmorphone, HYDROmorphone, ibuprofen, ipratropium-albuterol, labetalol, melatonin, ondansetron (ZOFRAN) IV, phenol, polyethylene glycol

## 2021-07-09 NOTE — Progress Notes (Signed)
Carotid artery duplex and bilateral lower extremity venous duplex has been completed. Preliminary results can be found in CV Proc through chart review.   07/09/21 3:37 PM Stacey Dorsey RVT

## 2021-07-09 NOTE — Progress Notes (Addendum)
STROKE TEAM PROGRESS NOTE   INTERVAL HISTORY Patient seen in dialysis.  She appears hemodynamically stable and in sinus rhythm currently.  When discussing her course of the events leading up to today, she states that she still sometimes felt off or out of sorts and that she has noticed a fluctuation in her word finding and speaking since being here.  She has also felt palpitations, dizziness, fluttering in the past.  We discussed the possibility of a 30-day monitor or loop recorder and the possibility of needing rehab.  Vitals:   07/09/21 1030 07/09/21 1100 07/09/21 1213 07/09/21 1300  BP: (!) 145/63 (!) 160/62 (!) 197/59 (!) 177/68  Pulse: 63 64 72 71  Resp:   16 19  Temp:   98.9 F (37.2 C)   TempSrc:   Oral   SpO2:   98% 96%  Weight:      Height:       CBC:  Recent Labs  Lab 07/08/21 0041 07/09/21 0802  WBC 9.5 6.0  HGB 9.4* 8.8*  HCT 30.9* 28.1*  MCV 93.6 93.0  PLT 154 174   Basic Metabolic Panel:  Recent Labs  Lab 07/06/21 0026 07/07/21 0233 07/08/21 0041 07/09/21 0800  NA 138 138 138 139  K 3.5 3.9 3.8 3.5  CL 103 101 101 104  CO2 23 27 25 26   GLUCOSE 106* 113* 90 95  BUN 28* 17 26* 32*  CREATININE 5.85* 4.41* 6.02* 6.48*  CALCIUM 8.6* 8.8* 8.9 8.7*  MG 2.1 2.0 2.2  --   PHOS 3.7  --   --  3.5   Lipid Panel:  Recent Labs  Lab 07/05/21 0510  TRIG 107   HgbA1c:  Recent Labs  Lab 07/04/21 1018  HGBA1C 6.1*   Urine Drug Screen: No results for input(s): LABOPIA, COCAINSCRNUR, LABBENZ, AMPHETMU, THCU, LABBARB in the last 168 hours.  Alcohol Level No results for input(s): ETH in the last 168 hours.  IMAGING past 24 hours MR BRAIN WO CONTRAST  Result Date: 07/08/2021 CLINICAL DATA:  Neuro deficit, acute, stroke suspected. EXAM: MRI HEAD WITHOUT CONTRAST TECHNIQUE: Multiplanar, multiecho pulse sequences of the brain and surrounding structures were obtained without intravenous contrast. COMPARISON:  Head CT yesterday. FINDINGS: Brain: Diffusion imaging  shows approximately 10 punctate acute infarctions scattered within the left MCA territory consistent with micro embolic infarctions. No large or confluent acute infarction. No focal abnormality affects the brainstem or cerebellum. Old small vessel infarction in the right thalamus. Mild chronic small-vessel ischemic changes elsewhere within the cerebral hemispheric white matter. 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 No mass, hydrocephalus or extra-axial collection. Vascular: Major vessels at the base of the brain show flow. Skull and upper cervical spine: Negative Sinuses/Orbits: Clear/normal Other: Small right mastoid effusion. IMPRESSION: Approximately 10 punctate acute infarctions scattered within the left MCA territory consistent with micro embolic infarctions. 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 Mild chronic small-vessel ischemic changes elsewhere within the cerebral hemispheric white matter. Old lacunar infarction right thalamus. Electronically Signed   By: Nelson Chimes M.D.   On: 07/08/2021 19:52   EEG adult  Result Date: 07/09/2021 Lora Havens, MD     07/09/2021  8:15 AM Patient Name: Stacey Dorsey MRN: [id number] Epilepsy Attending: Lora Havens Referring Physician/Provider: Anibal Henderson, NP Date: 07/08/2021 Duration: 23.01 mins Patient history: 87 y.o. female who presented to the hospital 12/8 for evaluation of out-of-hospital cardiac arrest with 15 minutes of CPR prior to  ROSC.  EEG to evaluate for seizure. Level of alertness: Awake AEDs during EEG study: None Technical aspects: This EEG study was done with scalp electrodes positioned according to the 10-20 International system of electrode placement. Electrical activity was acquired at a sampling rate of 500Hz  and reviewed with a high frequency filter of 70Hz  and a low frequency filter of 1Hz . EEG data were recorded continuously and digitally stored. Description: The posterior  dominant rhythm consists of 9 Hz activity of moderate voltage (25-35 uV) seen predominantly in posterior head regions, symmetric and reactive to eye opening and eye closing. EEG showed intermittent generalized 3 to 6 Hz theta-delta slowing. Hyperventilation and photic stimulation were not performed.   ABNORMALITY - Intermittent slow, generalized IMPRESSION: This study is suggestive of mild diffuse encephalopathy, nonspecific etiology. No seizures or epileptiform discharges were seen throughout the recording. Priyanka Barbra Sarks    PHYSICAL EXAM  Temp:  [97.8 F (36.6 C)-98.9 F (37.2 C)] 98.9 F (37.2 C) (12/13 1213) Pulse Rate:  [60-77] 71 (12/13 1300) Resp:  [12-19] 19 (12/13 1300) BP: (141-197)/(44-91) 177/68 (12/13 1300) SpO2:  [96 %-99 %] 96 % (12/13 1300) FiO2 (%):  [21 %] 21 % (12/13 0733) Weight:  [63.6 kg-64.5 kg] 63.6 kg (12/13 0737)  General - Well nourished, well developed, in no apparent distress.  Ophthalmologic - fundi not visualized due to noncooperation.  Cardiovascular - Regular rhythm and rate.  Mental Status -  Level of arousal and orientation to time, place, and person were intact. Difficulty with the fluidity of speech.  Seems to worsen with patient's frustration.  Patient is able to repeat, name items, and form full sentences with some difficulty towards the end of the exam. Attention span and concentration were normal. Recent and remote memory were intact. Fund of Knowledge was assessed and was intact.  Cranial Nerves II - XII - II - Visual field intact OU. III, IV, VI - Extraocular movements intact. V - Facial sensation intact bilaterally. VII - Facial movement intact bilaterally. VIII - Hearing & vestibular intact bilaterally. X - Palate elevates symmetrically. XI - Chin turning & shoulder shrug intact bilaterally. XII - Tongue protrusion intact.  Motor Strength - The patients strength was normal in all extremities and pronator drift was absent.  Bulk was  normal and fasciculations were absent.   Motor Tone - Muscle tone was assessed at the neck and appendages and was normal. Reflexes - The patients reflexes were symmetrical in all extremities and she had no pathological reflexes. Sensory - Light touch, temperature/pinprick were assessed and were symmetrical.   Coordination - Tremors noted in bilateral upper extremities.  Gait and Station - deferred.   ASSESSMENT/PLAN Stacey Dorsey is a 87 y.o. female with history of ESRD on hemodialysis, poorly controlled hypertension, diabetes myelitis type II, hyperlipidemia, COPD with chronic oxygen presenting s/p cardiac arrest.  Patient was extubated 12/9.  Patient was noted to have intermittent aphasia on 12/11 and 212/12 with a stable CT head.  On the morning of 12/12 the patient's daughter was speaking with her on the phone and could not understand what she was saying.  MRI noted left MCA punctate infarcts that appear microembolic.  Plan for 30-day monitor or loop recorder prior to discharge.  Patient is currently n.p.o., needs speech consult after dialysis.  Plan to start Plavix, aspirin, and atorvastatin once she is able to take p.o. medications.  Patient was made n.p.o. after her episode of aphasia yesterday.  Stroke:  Left MCA scattered several  punctate infarcts, etiology unclear, possibly cardioembolic vs cryptogenic Code Stroke- stable left frontal encephalomalacia Carotid ultrasound-pending Lower extremity venous duplex-pending MRI  -approximately 10 punctate acute infarctions scattered within the left MCA territory consistent with microembolic infarctions.  Old cortical and subcortical infarctions on the left frontal lobe. Old lacunar infarction in the right thalamus MRA head pending 2D Echo LVEF 50 to 55% Recommend loop recorder versus 30-day cardiac event monitoring.  Patient and her daughter are considering. LDL pending HgbA1c pending VTE prophylaxis - Heparin subq No antithrombotic prior  to admission, now on Plavix and aspirin DAPT. Therapy recommendations: CIR Disposition:  Pending  Hypertension Home meds: Coreg 25 mg, nifedipine 30 mg Stable Long-term BP goal normotensive Avoid low BP during dialysis  Hyperlipidemia Home meds: Simvastatin 20 mg, resumed in hospital LDL pending, goal < 70 Continue statin at discharge  Diabetes type II Controlled Home meds: None HgbA1c 6.1, goal < 7.0 CBGs SSI-NovoLog  Other Stroke Risk Factors Advanced Age >/= 81   Other Active Problems Cardiac arrest PEA cardiac arrest, ROSC after 15 minutes Presumed trigger pneumonia/hypoxia Acute on chronic respiratory failure Continue antibiotics x7 days per PCCM Continue aggressive pulmonary hygiene Consider outpatient PFTs/pulmonary follow up. ESRD with AKI Hemodialysis per nephrology Rib fractures Pain managed by primary.  Adequately controlled with as needed medications. RLS Ropinirole  Hospital day # 5  Patient seen and examined by NP/APP with MD. MD to update note as needed.   Janine Ores, DNP, FNP-BC [facility] Pager: [phone]  ATTENDING NOTE: I reviewed above note and agree with the assessment and plan. Pt was seen and examined.   87 year old female with history of ESRD on hemodialysis, HTN, diabetes, HLD, COPD on oxygen admitted on 07/04/2021 for SOB and altered mental status found to have PEA arrest for 15 minutes status post CPR achieved ROSC.  She was intubated and then extubated 12/9.  Found to have pneumonia from antibiotics with azithromycin and Rocephin.  CT head showed left frontal encephalomalacia, no acute infarct.  However patient has intermittent agitation, restlessness and delirium with episode of aphasia on 12/12, now resolved.  Repeat CT no acute abnormality.  MRI showed 10 punctate left MCA infarcts, concerning for cardioembolic source.  MRA, carotid Doppler pending.  EF 50 to 55%.  LDL, A1c pending.  LE venous Doppler pending.  EEG no  seizure.  Running 6.02, hemoglobin 8.8.  On exam, patient seen at HD unit, she is lying in bed, awake alert, oriented x3, mild to moderate dysarthria, however, no aphasia, follows simple commands, able to name and repeat.  Visual fields full, no gaze palsy, facial symmetrical.  Lateral upper and lower extremity 4/5, sensation symmetrical, no sensory neglect.  Bilateral finger-to-nose slow but grossly intact.  Etiology for patient's toe concerning for cardioembolic source, will follow-up MRA and carotid Doppler to rule out large vessel disease.  Given concern for cardioembolic source, follow-up with LE venous Doppler and recommend loop recorder versus 30-day cardiac event monitoring to rule out occult A. fib.  Patient and the daughter are considering.  They will let us know.  She passed a swallow, will put on aspirin 81 and Plavix 75 DAPT for now.  Continue Zocor 20.  PT/OT repair and home health PT/OT.  We will follow  For detailed assessment and plan, please refer to above as I have made changes wherever appropriate.   Rosalin Hawking, MD PhD Stroke Neurology 07/09/2021 4:38 PM    To contact Stroke Continuity provider, please refer to [email or web address] After  hours, contact General Neurology

## 2021-07-09 NOTE — Progress Notes (Signed)
Inpatient Rehab Admissions Coordinator:   Per PT recommendations,  patient was screened for CIR candidacy by Clemens Catholic, MS, CCC-SLP  At this time, OT is recommending HH and Pt.'s insurance is unlikely to approve CIR for her diagnosis. I will not pursue a rehab consult for this Pt. Recommend TOC consider other rehab venues. Please contact me with any questions.   Clemens Catholic, [place], [place] Admissions Coordinator  [phone] ([place]) [phone] (office)

## 2021-07-09 NOTE — Progress Notes (Signed)
SLP Cancellation Note  Patient Details Name: Stacey Dorsey MRN: [id number] DOB: Sep 11, 1932   Cancelled treatment:       Reason Eval/Treat Not Completed: Patient at procedure or test/unavailable (HD). Will f/u as soon as able.     Osie Bond., M.A. [place] [facility] Pager [phone] Office [phone]  07/09/2021, 8:35 AM

## 2021-07-09 NOTE — Evaluation (Signed)
Clinical/Bedside Swallow Evaluation Patient Details  Name: Stacey Dorsey MRN: [id number] Date of Birth: 11-22-1932  Today's Date: 07/09/2021 Time: SLP Start Time (ACUTE ONLY): 90 SLP Stop Time (ACUTE ONLY): 1415 SLP Time Calculation (min) (ACUTE ONLY): 13 min  Past Medical History:  Past Medical History:  Diagnosis Date   Chronic kidney disease    COPD (chronic obstructive pulmonary disease) ([place])    Diabetes mellitus without complication ([place])    Hyperlipidemia    Hypertension    Past Surgical History:  Past Surgical History:  Procedure Laterality Date   ABDOMINAL HYSTERECTOMY     AV FISTULA PLACEMENT     IR REMOVAL TUN CV CATH W/O FL  09/07/2020   HPI:  87 yo female presenting 12/8 after cardiac arrest requiring 15 min CPR prior to ROSC. Intubated PTA, extubated 12/9. On 12/11, pt with new onset word-finding, and MRI revealed approximately 10 punctate acute infarctions in the L frontal lobe. PMH includes: ESRD on HD, poorly controlled HTN, DM, HLD, (presumed    Assessment / Plan / Recommendation  Clinical Impression  Pt's oropharyngeal swallow appears to be [place] with no overt s/s of aspiration observed. She is observed to have an audible swallow, but she reports this to be baseline for her. Note that she also passed a [facility] swallow screen earlier today. Will advance back to previous diet of regular solids and thin liquids. No SLP f/u indicated for swallowing, but will continue to follow for communication. SLP Visit Diagnosis: Dysphagia, unspecified (R13.10)    Aspiration Risk  Mild aspiration risk    Diet Recommendation Regular;Thin liquid   Liquid Administration via: Cup;Straw Medication Administration: Whole meds with liquid Supervision: Patient able to self feed Compensations: Slow rate;Small sips/bites Postural Changes: Seated upright at 90 degrees    Other  Recommendations Oral Care Recommendations: Oral care BID    Recommendations for follow up therapy are one  component of a multi-disciplinary discharge planning process, led by the attending physician.  Recommendations may be updated based on patient status, additional functional criteria and insurance authorization.  Follow up Recommendations Acute inpatient rehab (3hours/day) (for cognitive-linguistic function)      Assistance Recommended at Discharge Intermittent Supervision/Assistance  Functional Status Assessment Patient has not had a recent decline in their functional status (for swallowing)  Frequency and Duration            Prognosis Prognosis for Safe Diet Advancement: Good      Swallow Study   General HPI: 87 yo female presenting 12/8 after cardiac arrest requiring 15 min CPR prior to [place]. Intubated PTA, extubated 12/9. On 12/11, pt with new onset word-finding, and MRI revealed approximately 10 punctate acute infarctions in the L frontal lobe. PMH includes: ESRD on HD, poorly controlled HTN, DM, HLD, (presumed Type of Study: Bedside Swallow Evaluation Previous Swallow Assessment: none in chart Diet Prior to this Study: NPO Temperature Spikes Noted: No Respiratory Status: Room air History of Recent Intubation: No Behavior/Cognition: Alert;Cooperative Oral Cavity Assessment: Within Functional Limits Oral Care Completed by SLP: No Oral Cavity - Dentition: Dentures, top;Dentures, bottom Vision: Functional for self-feeding Self-Feeding Abilities: Able to feed self Patient Positioning: Upright in bed Baseline Vocal Quality: Normal Volitional Swallow: Able to elicit    Oral/Motor/Sensory Function Overall Oral Motor/Sensory Function: Within functional limits   Ice Chips Ice chips: Not tested   Thin Liquid Thin Liquid: Within functional limits Presentation: Cup;Self Fed;Straw    Nectar Thick Nectar Thick Liquid: Not tested   Honey Thick Honey Thick Liquid:  Not tested   Puree Puree: Within functional limits Presentation: Spoon   Solid     Solid: Within functional  limits Presentation: Self Fed      Osie Bond., M.A. [place] Pager [phone] Office [phone]  07/09/2021,2:58 PM

## 2021-07-09 NOTE — Procedures (Signed)
° °  I was present at this dialysis session, have reviewed the session itself and made  appropriate changes Kelly Splinter MD [place] pager [phone]   07/09/2021, 11:39 AM

## 2021-07-09 NOTE — Evaluation (Signed)
Speech Language Pathology Evaluation Patient Details Name: Stacey Dorsey MRN: [id number] DOB: April 30, 1933 Today's Date: 07/09/2021 Time: 7628-3151 SLP Time Calculation (min) (ACUTE ONLY): 17 min  Problem List:  Patient Active Problem List   Diagnosis Date Noted   Cerebral embolism with cerebral infarction 07/09/2021   Malnutrition of moderate degree 07/06/2021   Cardiac arrest ([place]) 07/04/2021   Past Medical History:  Past Medical History:  Diagnosis Date   Chronic kidney disease    COPD (chronic obstructive pulmonary disease) ([place])    Diabetes mellitus without complication ([place])    Hyperlipidemia    Hypertension    Past Surgical History:  Past Surgical History:  Procedure Laterality Date   ABDOMINAL HYSTERECTOMY     AV FISTULA PLACEMENT     IR REMOVAL TUN CV CATH W/O FL  09/07/2020   HPI:  87 yo female presenting 12/8 after cardiac arrest requiring 15 min CPR prior to ROSC. Intubated PTA, extubated 12/9. On 12/11, pt with new onset word-finding, and MRI revealed approximately 10 punctate acute infarctions in the L frontal lobe. PMH includes: ESRD on HD, poorly controlled HTN, DM, HLD, (presumed   Assessment / Plan / Recommendation Clinical Impression  Pt presents with acute cognitive-linguistic deficits, with expressive language particularly effected. She has word-finding errors as well as frequent phonemic paraphasias. She is often aware of these errors but needs assistance to correct. She also has impaired repetition at the sentence level and difficulties with mildly complex comprehension. She was able to repeat at the word level to attempt delayed recall task, but had difficulty with immediate and delayed recall (0 out of 4 words recalled). Pt will benefit from ongoing cognitive-linguistic therapy.    SLP Assessment  SLP Recommendation/Assessment: Patient needs continued Speech [facility] SLP Visit Diagnosis: Aphasia (R47.01)    Recommendations for follow  up therapy are one component of a multi-disciplinary discharge planning process, led by the attending physician.  Recommendations may be updated based on patient status, additional functional criteria and insurance authorization.    Follow Up Recommendations  Acute inpatient rehab (3hours/day)    Assistance Recommended at Discharge  Intermittent Supervision/Assistance  Functional Status Assessment Patient has had a recent decline in their functional status and demonstrates the ability to make significant improvements in function in a reasonable and predictable amount of time.  Frequency and Duration min 2x/week  2 weeks      SLP Evaluation Cognition  Overall Cognitive Status: Impaired/Different from baseline Arousal/Alertness: Awake/alert Orientation Level: Oriented X4 Attention: Sustained Sustained Attention: Appears intact Memory: Impaired Memory Impairment: Storage deficit;Retrieval deficit;Decreased recall of new information Awareness: Impaired Awareness Impairment: Emergent impairment Safety/Judgment: Impaired       Comprehension  Auditory Comprehension Overall Auditory Comprehension: Impaired Yes/No Questions: Impaired Basic Biographical Questions: 76-100% accurate Basic Immediate Environment Questions: 75-100% accurate Complex Questions: 50-74% accurate Commands: Within Functional Limits    Expression Expression Primary Mode of Expression: Verbal Verbal Expression Overall Verbal Expression: Impaired Initiation: No impairment Automatic Speech: Name;Social Response Level of Generative/Spontaneous Verbalization: Phrase;Sentence Repetition: Impaired Level of Impairment: Sentence level Naming: Impairment Confrontation: Within functional limits Divergent:  (8 words in one minute) Verbal Errors: Phonemic paraphasias;Neologisms;Aware of errors (mostly aware) Pragmatics: No impairment   Oral / Motor  Oral Motor/Sensory Function Overall Oral Motor/Sensory Function:  Within functional limits Motor Speech Overall Motor Speech: Appears within functional limits for tasks assessed   GO                    Mickel Baas  N., M.A. [place] [facility] Pager [phone] Office [phone]  07/09/2021, 3:04 PM

## 2021-07-09 NOTE — Plan of Care (Signed)

## 2021-07-09 NOTE — Progress Notes (Signed)
PROGRESS NOTE  Luciel Rico  EUM:[id number] DOB: December 28, 1932 DOA: 07/04/2021 PCP: Mindi Curling, PA-C   Brief Narrative: Brixton Ellis is an 87 yo F hx ESRD on HD, poorly controlled HTN, T2DM, HLD, (presumed) COPD on chronic supplemental oxygen who presented to [facility] 12/8 following cardiac arrest. Patient was in usual state of health until 12/8 morning when she felt short of breath while getting ready to go to dialysis. EMS was called, on EMS arrival pt was altered and subsequently sustained witnessed cardiac arrest -- PEA. Received 15 min CPR, 1 epi, 1g calcium, 50mg  bicarb, as well as 126mcg fent and  5mg  versed. King placed in field which was exchanged for ETT in ED. CXR revealed right-sided airspace disease. Opacities confirmed on CTA chest which showed no PE, suggested volume overload, and multiple bilateral rib fractures. CT head showed only encephalomalacia from old anterior MCA-territory stroke. Started on rocephin and azithromycin for pneumonia. Echocardiogram revealed LVEF 50-55%, G1DD, normal RV size and function, severely elevated PASP with estimated RVSP of 61.13mmHg. Volume status has been regulated by hemodialysis. Ultimately was extubated 12/9 and transferred out of ICU 12/11. CT head on that date due to memory difficulties was stable from admission. Due to persistence of symptoms, MRI brain was performed showing multiple infarcts suspected to be due to microemboli during resuscitation. Neurology is consulted and further work up is ordered.  Assessment & Plan: Principal Problem:   Cardiac arrest [facility]) Active Problems:   Malnutrition of moderate degree   Cerebral embolism with cerebral infarction  OOH PEA cardiac arrest: ROSC after 15 minutes. With asymmetric infiltrates, preceding dyspnea and preserved LVEF, pneumonia/hypoxia is presumptive trigger.  - Continue cardiac monitoring.   Rib fractures:  - Continue pain medication: Changed oxycodone prn to hydromorphone prn due to  ESRD. - Pulmonary hygiene.   Acute metabolic encephalopathy, acute stroke: Suspected to be delirium as well as expressive aphasia due to stroke. EEG 12/12 was suggestive of mild, nonspecific, diffuse encephalopathy without seizures or epileptiform discharges.  - CT head suggested anterior left MCA stroke/encephalomalacia further defined by MRI as multiple microembolic infarcts in that distribution. D/w Dr. Rory Percy who has ordered CTA and confirms stroke neurology will round on patient today.  Acute on chronic hypoxic respiratory failure: Due to right-sided CAP vs. aspiration pneumonia - Continue antibiotics x7 days per PCCM - Continue aggressive pulmonary hygiene - Consider outpatient PFTs/pulmonary follow up.  ESRD:  - Continue HD TTS per nephrology  HTN:  - Continue coreg, nifedipine. Clonidine was added, will monitor BP trends, avoid hypotension  T2DM: HbA1c 6.1%. Would not attempt too rigid of control to avoid neuroglycopenia.  - SSI   AOCKD and iron deficiency anemia: Stable - Per nephrology. Continue monitoring.  Gout: Chronic, quiescent.  - Continue allopurinol.  RLS:  - Continue ropinirole  HLD:  - Continue statin  Moderate protein-calorie malnutrition:  - MVM, protein supplementation  DVT prophylaxis: Heparin  Code Status: Full Family Communication: None at bedside. Daughter by phone yesterday. Called today without answer.  Disposition Plan:  Status is: Inpatient  Remains inpatient appropriate because: AMS, neurological deficit.  Consultants:  PCCM Neurology  Procedures:  Echo  Antimicrobials: Ceftriaxone, azithromycin   Subjective: Expressive aphasia a bit better this morning, though still troublesome to patient. Seen in HD, tolerating dialysis without issues. She was made NPO and complains of dry mouth currently. No new numbness or weakness. Pain across her chest is stable, improved with dilaudid.  Objective: Vitals:   07/09/21 0930 07/09/21 1000  07/09/21 1030 07/09/21 1100  BP: (!) 141/63 (!) 145/71 (!) 145/63 (!) 160/62  Pulse: 61 62 63 64  Resp: 15     Temp:      TempSrc:      SpO2:      Weight:      Height:        Intake/Output Summary (Last 24 hours) at 07/09/2021 1118 Last data filed at 07/09/2021 0300 Gross per 24 hour  Intake 597.4 ml  Output --  Net 597.4 ml   Filed Weights   07/08/21 0100 07/09/21 0500 07/09/21 0737  Weight: 65.3 kg 64.5 kg 63.6 kg   Gen: Elderly female in no distress Pulm: Nonlabored breathing room air. Clear. CV: Regular rate and rhythm. No murmur, rub, or gallop. No JVD, no pitting dependent edema. GI: Abdomen soft, non-tender, non-distended, with normoactive bowel sounds.  Ext: Warm, no deformities Skin: No new rashes, lesions or ulcers on visualized skin. Neuro: Alert and essentially oriented with slowed speech, continued expressive aphasia. Moves extremities without focality.   Psych: Judgement and insight appear fair. Mood euthymic & affect congruent. Behavior is appropriate.    Data Reviewed: I have personally reviewed following labs and imaging studies  CBC: Recent Labs  Lab 07/05/21 0716 07/06/21 0026 07/07/21 0233 07/08/21 0041 07/09/21 0802  WBC 8.9 10.3 9.8 9.5 6.0  HGB 10.4* 9.4* 9.0* 9.4* 8.8*  HCT 33.5* 29.7* 29.6* 30.9* 28.1*  MCV 93.1 91.7 93.1 93.6 93.0  PLT 114* 130* 123* 154 024   Basic Metabolic Panel: Recent Labs  Lab 07/04/21 1018 07/04/21 1234 07/05/21 0510 07/06/21 0026 07/07/21 0233 07/08/21 0041 07/09/21 0800  NA 134*   < > 140 138 138 138 139  K 4.4   < > 3.2* 3.5 3.9 3.8 3.5  CL 100  --  101 103 101 101 104  CO2 19*  --  24 23 27 25 26   GLUCOSE 205*  --  109* 106* 113* 90 95  BUN 28*  --  22 28* 17 26* 32*  CREATININE 5.84*  --  4.91* 5.85* 4.41* 6.02* 6.48*  CALCIUM 9.0  --  8.8* 8.6* 8.8* 8.9 8.7*  MG 2.0  --  1.9 2.1 2.0 2.2  --   PHOS  --   --   --  3.7  --   --  3.5   < > = values in this interval not displayed.   GFR: Estimated  Creatinine Clearance: 6.3 mL/min (A) (by C-G formula based on SCr of 6.48 mg/dL (H)). Liver Function Tests: Recent Labs  Lab 07/04/21 1018 07/09/21 0800  AST 93*  --   ALT 60*  --   ALKPHOS 71  --   BILITOT 1.2  --   PROT 5.2*  --   ALBUMIN 2.7* 2.8*   No results for input(s): LIPASE, AMYLASE in the last 168 hours. Recent Labs  Lab 07/04/21 1018 07/05/21 0510  AMMONIA 68* 32   Coagulation Profile: No results for input(s): INR, PROTIME in the last 168 hours. Cardiac Enzymes: No results for input(s): CKTOTAL, CKMB, CKMBINDEX, TROPONINI in the last 168 hours. BNP (last 3 results) No results for input(s): PROBNP in the last 8760 hours. HbA1C: No results for input(s): HGBA1C in the last 72 hours. CBG: Recent Labs  Lab 07/08/21 1137 07/08/21 1626 07/08/21 2005 07/09/21 0032 07/09/21 0434  GLUCAP 137* 157* 116* 116* 152*   Lipid Profile: No results for input(s): CHOL, HDL, LDLCALC, TRIG, CHOLHDL, LDLDIRECT in the last 72 hours.  Thyroid Function Tests: No results for input(s): TSH, T4TOTAL, FREET4, T3FREE, THYROIDAB in the last 72 hours. Anemia Panel: No results for input(s): VITAMINB12, FOLATE, FERRITIN, TIBC, IRON, RETICCTPCT in the last 72 hours. Urine analysis:    Component Value Date/Time   COLORURINE YELLOW 07/04/2021 1205   APPEARANCEUR HAZY (A) 07/04/2021 1205   LABSPEC 1.015 07/04/2021 1205   PHURINE 8.5 (H) 07/04/2021 1205   GLUCOSEU >=500 (A) 07/04/2021 1205   HGBUR SMALL (A) 07/04/2021 1205   BILIRUBINUR NEGATIVE 07/04/2021 1205   [place] 07/04/2021 1205   PROTEINUR 100 (A) 07/04/2021 1205   NITRITE NEGATIVE 07/04/2021 1205   LEUKOCYTESUR NEGATIVE 07/04/2021 1205   Recent Results (from the past 240 hour(s))  Resp Panel by RT-PCR (Flu A&B, Covid) Nasopharyngeal Swab     Status: None   Collection Time: 07/04/21  9:41 AM   Specimen: Nasopharyngeal Swab; Nasopharyngeal(NP) swabs in vial transport medium  Result Value Ref Range Status   SARS  Coronavirus 2 by RT PCR NEGATIVE NEGATIVE Final    Comment: (NOTE) SARS-CoV-2 target nucleic acids are NOT DETECTED.  The SARS-CoV-2 RNA is generally detectable in upper respiratory specimens during the acute phase of infection. The lowest concentration of SARS-CoV-2 viral copies this assay can detect is 138 copies/mL. A negative result does not preclude SARS-Cov-2 infection and should not be used as the sole basis for treatment or other patient management decisions. A negative result may occur with  improper specimen collection/handling, submission of specimen other than nasopharyngeal swab, presence of viral mutation(s) within the areas targeted by this assay, and inadequate number of viral copies(<138 copies/mL). A negative result must be combined with clinical observations, patient history, and epidemiological information. The expected result is Negative.  Fact Sheet for Patients:  EntrepreneurPulse.com.au  Fact Sheet for Healthcare Providers:  IncredibleEmployment.be  This test is no t yet approved or cleared by the [place] FDA and  has been authorized for detection and/or diagnosis of SARS-CoV-2 by FDA under an Emergency Use Authorization (EUA). This EUA will remain  in effect (meaning this test can be used) for the duration of the COVID-19 declaration under Section 564(b)(1) of the Act, 21 U.S.C.section 360bbb-3(b)(1), unless the authorization is terminated  or revoked sooner.       Influenza A by PCR NEGATIVE NEGATIVE Final   Influenza B by PCR NEGATIVE NEGATIVE Final    Comment: (NOTE) The Xpert Xpress SARS-CoV-2/FLU/RSV plus assay is intended as an aid in the diagnosis of influenza from Nasopharyngeal swab specimens and should not be used as a sole basis for treatment. Nasal washings and aspirates are unacceptable for Xpert Xpress SARS-CoV-2/FLU/RSV testing.  Fact Sheet for  Patients: EntrepreneurPulse.com.au  Fact Sheet for Healthcare Providers: IncredibleEmployment.be  This test is not yet approved or cleared by the [place] FDA and has been authorized for detection and/or diagnosis of SARS-CoV-2 by FDA under an Emergency Use Authorization (EUA). This EUA will remain in effect (meaning this test can be used) for the duration of the COVID-19 declaration under Section 564(b)(1) of the Act, 21 U.S.C. section 360bbb-3(b)(1), unless the authorization is terminated or revoked.  Performed at [facility], [place] [address] [place], [place] [address]   Culture, blood (routine x 2)     Status: None   Collection Time: 07/04/21 11:23 AM   Specimen: Right Antecubital; Blood  Result Value Ref Range Status   Specimen Description RIGHT ANTECUBITAL  Final   Special Requests BOTTLES DRAWN AEROBIC ONLY  Final  Culture   Final    NO GROWTH 5 DAYS Performed at [facility], [place] [address] [place], [place] [address]    Report Status 07/09/2021 FINAL  Final  Culture, blood (routine x 2)     Status: None   Collection Time: 07/04/21 11:23 AM   Specimen: Right Antecubital; Blood  Result Value Ref Range Status   Specimen Description RIGHT ANTECUBITAL  Final   Special Requests ANAEROBIC  Final   Culture   Final    NO GROWTH 5 DAYS Performed at [facility], [place] [address], [place], [place] [address]    Report Status 07/09/2021 FINAL  Final  Respiratory (~20 pathogens) panel by PCR     Status: None   Collection Time: 07/04/21  1:15 PM   Specimen: Nasopharyngeal Swab; Respiratory  Result Value Ref Range Status   Adenovirus NOT DETECTED NOT DETECTED Final   Coronavirus 229E NOT DETECTED NOT DETECTED Final    Comment: (NOTE) The Coronavirus on the Respiratory Panel, DOES NOT test for the novel  Coronavirus (2019 nCoV)    Coronavirus HKU1 NOT DETECTED NOT DETECTED Final   Coronavirus NL63 NOT DETECTED NOT  DETECTED Final   Coronavirus OC43 NOT DETECTED NOT DETECTED Final   Metapneumovirus NOT DETECTED NOT DETECTED Final   Rhinovirus / Enterovirus NOT DETECTED NOT DETECTED Final   Influenza A NOT DETECTED NOT DETECTED Final   Influenza B NOT DETECTED NOT DETECTED Final   Parainfluenza Virus 1 NOT DETECTED NOT DETECTED Final   Parainfluenza Virus 2 NOT DETECTED NOT DETECTED Final   Parainfluenza Virus 3 NOT DETECTED NOT DETECTED Final   Parainfluenza Virus 4 NOT DETECTED NOT DETECTED Final   Respiratory Syncytial Virus NOT DETECTED NOT DETECTED Final   Bordetella pertussis NOT DETECTED NOT DETECTED Final   Bordetella Parapertussis NOT DETECTED NOT DETECTED Final   Chlamydophila pneumoniae NOT DETECTED NOT DETECTED Final   Mycoplasma pneumoniae NOT DETECTED NOT DETECTED Final    Comment: Performed at [facility], [place] [address] [place], [place] [address]  MRSA Next Gen by PCR, Nasal     Status: None   Collection Time: 07/04/21  4:32 PM   Specimen: Nasal Mucosa; Nasal Swab  Result Value Ref Range Status   MRSA by PCR Next Gen NOT DETECTED NOT DETECTED Final    Comment: (NOTE) The GeneXpert MRSA Assay (FDA approved for NASAL specimens only), is one component of a comprehensive MRSA colonization surveillance program. It is not intended to diagnose MRSA infection nor to guide or monitor treatment for MRSA infections. Test performance is not FDA approved in patients less than 29 years old. Performed at [facility], [place] [address], [place], [place] [address]       Radiology Studies: CT HEAD WO CONTRAST (5MM)  Result Date: 07/07/2021 CLINICAL DATA:  87 year old female TIA. EXAM: CT HEAD WITHOUT CONTRAST TECHNIQUE: Contiguous axial images were obtained from the base of the skull through the vertex without intravenous contrast. COMPARISON:  Head CT 07/04/2021. FINDINGS: Brain: Stable bilateral basal ganglia vascular calcifications. Stable encephalomalacia in the left middle  frontal gyrus along the anterior insula and operculum. Elsewhere gray-white matter differentiation remains normal for age. No superimposed midline shift, ventriculomegaly, mass effect, evidence of mass lesion, intracranial hemorrhage or evidence of cortically based acute infarction. Vascular: Calcified atherosclerosis at the skull base. No suspicious intracranial vascular hyperdensity. Skull: Questionable chronic left orbital floor fracture. No acute osseous abnormality identified. Sinuses/Orbits: Visualized paranasal sinuses and mastoids are  stable and well aerated. Other: No acute orbit or scalp soft tissue finding. IMPRESSION: 1. Stable non contrast CT appearance of the brain. No acute or evolving infarct is identified. 2. Stable chronic appearing left MCA anterior division territory infarct. Electronically Signed   By: Genevie Ann M.D.   On: 07/07/2021 11:56   MR BRAIN WO CONTRAST  Result Date: 07/08/2021 CLINICAL DATA:  Neuro deficit, acute, stroke suspected. EXAM: MRI HEAD WITHOUT CONTRAST TECHNIQUE: Multiplanar, multiecho pulse sequences of the brain and surrounding structures were obtained without intravenous contrast. COMPARISON:  Head CT yesterday. FINDINGS: Brain: Diffusion imaging shows approximately 10 punctate acute infarctions scattered within the left MCA territory consistent with micro embolic infarctions. No large or confluent acute infarction. No focal abnormality affects the brainstem or cerebellum. Old small vessel infarction in the right thalamus. Mild chronic small-vessel ischemic changes elsewhere within the cerebral hemispheric white matter. 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 No mass, hydrocephalus or extra-axial collection. Vascular: Major vessels at the base of the brain show flow. Skull and upper cervical spine: Negative Sinuses/Orbits: Clear/normal Other: Small right mastoid effusion. IMPRESSION: Approximately 10 punctate acute infarctions scattered  within the left MCA territory consistent with micro embolic infarctions. 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 Mild chronic small-vessel ischemic changes elsewhere within the cerebral hemispheric white matter. Old lacunar infarction right thalamus. Electronically Signed   By: Nelson Chimes M.D.   On: 07/08/2021 19:52   EEG adult  Result Date: 07/09/2021 Lora Havens, MD     07/09/2021  8:15 AM Patient Name: Hanifah Wuebker MRN: [id number] Epilepsy Attending: Lora Havens Referring Physician/Provider: Anibal Henderson, NP Date: 07/08/2021 Duration: 23.01 mins Patient history: 87 y.o. female who presented to the hospital 12/8 for evaluation of out-of-hospital cardiac arrest with 15 minutes of CPR prior to ROSC.  EEG to evaluate for seizure. Level of alertness: Awake AEDs during EEG study: None Technical aspects: This EEG study was done with scalp electrodes positioned according to the 10-20 International system of electrode placement. Electrical activity was acquired at a sampling rate of 500Hz  and reviewed with a high frequency filter of 70Hz  and a low frequency filter of 1Hz . EEG data were recorded continuously and digitally stored. Description: The posterior dominant rhythm consists of 9 Hz activity of moderate voltage (25-35 uV) seen predominantly in posterior head regions, symmetric and reactive to eye opening and eye closing. EEG showed intermittent generalized 3 to 6 Hz theta-delta slowing. Hyperventilation and photic stimulation were not performed.   ABNORMALITY - Intermittent slow, generalized IMPRESSION: This study is suggestive of mild diffuse encephalopathy, nonspecific etiology. No seizures or epileptiform discharges were seen throughout the recording. Priyanka Barbra Sarks    Scheduled Meds:   stroke: mapping our early stages of recovery book   Does not apply Once   allopurinol  100 mg Oral Daily   aspirin EC  325 mg Oral Daily   Or   aspirin  300 mg Rectal Daily    carvedilol  25 mg Oral 2 times per day on Sun Mon Wed Fri   Chlorhexidine Gluconate Cloth  6 each Topical Daily   cloNIDine  0.1 mg Oral BID   docusate sodium  100 mg Oral BID   feeding supplement (NEPRO CARB STEADY)  237 mL Oral BID BM   heparin  5,000 Units Subcutaneous Q8H   insulin aspart  0-9 Units Subcutaneous Q4H   multivitamin  1 tablet Oral QHS   NIFEdipine  30 mg Oral QHS   pantoprazole  40 mg Oral QHS   rOPINIRole  0.5 mg Oral QHS   senna  1 tablet Oral Daily   simvastatin  20 mg Oral QHS   Continuous Infusions:  sodium chloride Stopped (07/05/21 1832)   cefTRIAXone (ROCEPHIN)  IV 2 g (07/08/21 1213)   lactated ringers Stopped (07/04/21 1324)     LOS: 5 days   Time spent: 35 minutes.  Patrecia Pour, MD [facility] [email or web address] 07/09/2021, 11:18 AM

## 2021-07-09 NOTE — Procedures (Signed)
Patient Name: Christne Colpitts  MRN: [id number]  Epilepsy Attending: Lora Havens  Referring Physician/Provider: Anibal Henderson, NP Date: 07/08/2021 Duration: 23.01 mins  Patient history: 87 y.o. female who presented to the hospital 12/8 for evaluation of out-of-hospital cardiac arrest with 15 minutes of CPR prior to ROSC.  EEG to evaluate for seizure.  Level of alertness: Awake  AEDs during EEG study: None  Technical aspects: This EEG study was done with scalp electrodes positioned according to the 10-20 International system of electrode placement. Electrical activity was acquired at a sampling rate of 500Hz  and reviewed with a high frequency filter of 70Hz  and a low frequency filter of 1Hz . EEG data were recorded continuously and digitally stored.   Description: The posterior dominant rhythm consists of 9 Hz activity of moderate voltage (25-35 uV) seen predominantly in posterior head regions, symmetric and reactive to eye opening and eye closing. EEG showed intermittent generalized 3 to 6 Hz theta-delta slowing. Hyperventilation and photic stimulation were not performed.     ABNORMALITY - Intermittent slow, generalized  IMPRESSION: This study is suggestive of mild diffuse encephalopathy, nonspecific etiology. No seizures or epileptiform discharges were seen throughout the recording.  Laiken Nohr Barbra Sarks

## 2021-07-10 DIAGNOSIS — D631 Anemia in chronic kidney disease: Secondary | ICD-10-CM

## 2021-07-10 DIAGNOSIS — I6523 Occlusion and stenosis of bilateral carotid arteries: Secondary | ICD-10-CM

## 2021-07-10 DIAGNOSIS — N186 End stage renal disease: Secondary | ICD-10-CM

## 2021-07-10 DIAGNOSIS — K5900 Constipation, unspecified: Secondary | ICD-10-CM

## 2021-07-10 LAB — LIPID PANEL
Cholesterol: 139 mg/dL (ref 0–200)
HDL: 48 mg/dL (ref >40)
LDL Cholesterol: 73 mg/dL (ref 0–99)
Total CHOL/HDL Ratio: 2.9 RATIO
Triglycerides: 92 mg/dL (ref <150)
VLDL: 18 mg/dL (ref 0–40)

## 2021-07-10 LAB — GLUCOSE, CAPILLARY
Glucose-Capillary: 95 mg/dL (ref 70–99)
Glucose-Capillary: 191 mg/dL — ABNORMAL HIGH (ref 70–99)
Glucose-Capillary: 128 mg/dL — ABNORMAL HIGH (ref 70–99)
Glucose-Capillary: 184 mg/dL — ABNORMAL HIGH (ref 70–99)

## 2021-07-10 LAB — HEMOGLOBIN A1C
Hgb A1c MFr Bld: 5.7 % — ABNORMAL HIGH (ref 4.8–5.6)
Mean Plasma Glucose: 116.89 mg/dL

## 2021-07-10 MED ORDER — DARBEPOETIN ALFA 60 MCG/0.3ML IJ SOSY
60.0000 ug | PREFILLED_SYRINGE | INTRAMUSCULAR | Status: DC
  Administered 2021-07-11: 60 ug via INTRAVENOUS
  Filled 2021-07-10: qty 0.3

## 2021-07-10 MED ORDER — POLYETHYLENE GLYCOL 3350 17 G PO PACK
17.0000 g | PACK | Freq: Every day | ORAL | Status: DC
  Administered 2021-07-10 – 2021-07-15 (×3): 17 g via ORAL
  Filled 2021-07-10 (×4): qty 1

## 2021-07-10 MED ORDER — SENNA 8.6 MG PO TABS
2.0000 | ORAL_TABLET | Freq: Every day | ORAL | Status: DC
  Administered 2021-07-12 – 2021-07-16 (×3): 17.2 mg via ORAL
  Filled 2021-07-10 (×3): qty 2

## 2021-07-10 MED ORDER — ASPIRIN EC 325 MG PO TBEC
325.0000 mg | DELAYED_RELEASE_TABLET | Freq: Every day | ORAL | Status: DC
  Administered 2021-07-12 – 2021-07-16 (×5): 325 mg via ORAL
  Filled 2021-07-10 (×5): qty 1

## 2021-07-10 NOTE — Plan of Care (Signed)
°  Problem: Education: Goal: Knowledge of General Education information will improve Description: Including pain rating scale, medication(s)/side effects and non-pharmacologic comfort measures Outcome: Progressing   Problem: Health Behavior/Discharge Planning: Goal: Ability to manage health-related needs will improve Outcome: Progressing   Problem: Clinical Measurements: Goal: Ability to maintain clinical measurements within normal limits will improve Outcome: Progressing Goal: Will remain free from infection Outcome: Progressing Goal: Diagnostic test results will improve Outcome: Progressing Goal: Respiratory complications will improve Outcome: Progressing Goal: Cardiovascular complication will be avoided Outcome: Progressing   Problem: Activity: Goal: Risk for activity intolerance will decrease Outcome: Progressing   Problem: Nutrition: Goal: Adequate nutrition will be maintained Outcome: Progressing   Problem: Coping: Goal: Level of anxiety will decrease Outcome: Progressing   Problem: Elimination: Goal: Will not experience complications related to bowel motility Outcome: Progressing Goal: Will not experience complications related to urinary retention Outcome: Progressing   Problem: Pain Managment: Goal: General experience of comfort will improve Outcome: Progressing   Problem: Safety: Goal: Ability to remain free from injury will improve Outcome: Progressing   Problem: Skin Integrity: Goal: Risk for impaired skin integrity will decrease Outcome: Progressing   Problem: Education: Goal: Knowledge of disease and its progression will improve Outcome: Progressing Goal: Individualized Educational Video(s) Outcome: Progressing   Problem: Fluid Volume: Goal: Compliance with measures to maintain balanced fluid volume will improve Outcome: Progressing   Problem: Health Behavior/Discharge Planning: Goal: Ability to manage health-related needs will  improve Outcome: Progressing   Problem: Nutritional: Goal: Ability to make healthy dietary choices will improve Outcome: Progressing   Problem: Clinical Measurements: Goal: Complications related to the disease process, condition or treatment will be avoided or minimized Outcome: Progressing   Problem: Education: Goal: Knowledge of disease or condition will improve Outcome: Progressing Goal: Knowledge of secondary prevention will improve (SELECT ALL) Outcome: Progressing Goal: Knowledge of patient specific risk factors will improve (INDIVIDUALIZE FOR PATIENT) Outcome: Progressing Goal: Individualized Educational Video(s) Outcome: Progressing

## 2021-07-10 NOTE — Care Management Important Message (Signed)
Important Message  Patient Details  Name: Stacey Dorsey MRN: [id number] Date of Birth: July 22, 1933   Medicare Important Message Given:  Yes     Mataeo Ingwersen [address] 07/10/2021, 3:23 PM

## 2021-07-10 NOTE — Progress Notes (Addendum)
PROGRESS NOTE  Stacey Dorsey  SPQ:[id number] DOB: 1932/10/07 DOA: 07/04/2021 PCP: Mindi Curling, PA-C   Brief Narrative: Stacey Dorsey is an 87 yo F hx ESRD on HD, poorly controlled HTN, T2DM, HLD, (presumed) COPD on chronic supplemental oxygen who presented to [facility] 12/8 following cardiac arrest. Patient was in usual state of health until 12/8 morning when she felt short of breath while getting ready to go to dialysis. EMS was called, on EMS arrival pt was altered and subsequently sustained witnessed cardiac arrest -- PEA. Received 15 min CPR, 1 epi, 1g calcium, 50mg  bicarb, as well as 179mcg fent and  5mg  versed. King placed in field which was exchanged for ETT in ED. CXR revealed right-sided airspace disease. Opacities confirmed on CTA chest which showed no PE, suggested volume overload, and multiple bilateral rib fractures. CT head showed only encephalomalacia from old anterior MCA-territory stroke. Started on rocephin and azithromycin for pneumonia. Echocardiogram revealed LVEF 50-55%, G1DD, normal RV size and function, severely elevated PASP with estimated RVSP of 61.47mmHg. Volume status has been regulated by hemodialysis. Ultimately was extubated 12/9 and transferred out of ICU 12/11. CT head on that date due to memory difficulties was stable from admission. Due to persistence of symptoms, MRI brain was performed showing multiple infarcts suspected to be due to microemboli during resuscitation. Neurology is consulted and further work up is ordered.  Assessment & Plan: Principal Problem:   Cardiac arrest [facility]) Active Problems:   Malnutrition of moderate degree   Cerebral embolism with cerebral infarction  OOH PEA cardiac arrest: ROSC after 15 minutes. With asymmetric infiltrates, preceding dyspnea and preserved LVEF, pneumonia/hypoxia is presumptive trigger.  - Continue cardiac monitoring.   Rib fractures:  - Continue pain medication: Changed oxycodone prn to hydromorphone prn due to  ESRD. - Pulmonary hygiene.   Acute metabolic encephalopathy, acute stroke: Suspected to be delirium as well as expressive aphasia due to stroke. EEG 12/12 was suggestive of mild, nonspecific, diffuse encephalopathy without seizures or epileptiform discharges.  - CT head suggested anterior left MCA stroke/encephalomalacia further defined by MRI as multiple microembolic infarcts in that distribution.  Neurology/stroke MD input appreciated: Left MCA scattered several punctate infarcts, etiology unclear but possibly cardioembolic versus cryptogenic.  MRI brain showed approximately 10 punctate acute infarction scattered within the left MCA territory consistent with microembolic infarctions.  MRA head: Moderate to severe narrowing in the left supraclinoid ICA.  Decreased perfusion in the left distal MCA branches, without focal MCA stenosis or occlusion.  Multifocal narrowing of the right vertebral artery, which appears occluded just proximal to the vertebrobasilar junction.  CUS: Right ICA 40-59% stenosis, left ICA 1-39% stenosis. B/L LEV Dopplers: No DVT.  TTE: LVEF 33-35%, grade 1 diastolic dysfunction.  A1c 5.7.  LDL 73.  SLP and OT recommend CIR, consulted.  Per neurology, patient and family have agreed on a loop recorder and neurology is consulting EP cardiology for placement of same.  No antithrombotics PTA, now on DAPT with aspirin and Plavix.  Acute on chronic hypoxic respiratory failure: Due to right-sided CAP vs. aspiration pneumonia - Continue antibiotics x7 days per PCCM - Continue aggressive pulmonary hygiene - Consider outpatient PFTs/pulmonary follow up. -Currently saturating in the high 90s on room air.  ESRD:  - Continue HD TTS per nephrology  HTN:  - Continue coreg, nifedipine. Clonidine was added, will monitor BP trends, avoid hypotension  T2DM: HbA1c 5.7 %. Would not attempt too rigid of control to avoid neuroglycopenia.  - SSI   AOCKD  and iron deficiency anemia:  - Per  nephrology. Continue monitoring. -Hemoglobin has dropped from 9.4-8.8 in the absence of overt bleeding.  May be a lab error.  Follow CBC in AM.  Gout: Chronic, quiescent.  - Continue allopurinol.  RLS:  - Continue ropinirole  HLD:  - Continue statin  Moderate protein-calorie malnutrition:  - MVM, protein supplementation  Constipation -Initiated bowel regimen pain  DVT prophylaxis: Heparin  Code Status: Full Family Communication: None at bedside.  Disposition Plan:  Status is: Inpatient  Remains inpatient appropriate because: AMS, neurological deficit.  Consultants:  PCCM Neurology  Procedures:  Echo  Antimicrobials: Ceftriaxone, azithromycin   Subjective: Reports feeling generally weak.  No localizing symptoms.  Does say that at times she has "wobbly speech".  Constipated and no BM since admission.  Passing flatus.  No abdominal pain, nausea or vomiting.  Objective: Vitals:   07/09/21 1950 07/09/21 2354 07/10/21 0332 07/10/21 0756  BP: (!) 161/79 (!) 173/60 (!) 170/72 (!) 179/50  Pulse: 73 74 70 69  Resp: 17 17 18 17   Temp: 98.5 F (36.9 C) 98.4 F (36.9 C) 98.2 F (36.8 C) 98.7 F (37.1 C)  TempSrc: Oral Oral Oral Oral  SpO2: 95% 97% 98% 98%  Weight:      Height:        Intake/Output Summary (Last 24 hours) at 07/10/2021 1124 Last data filed at 07/10/2021 0300 Gross per 24 hour  Intake 280 ml  Output 1100 ml  Net -820 ml   Filed Weights   07/09/21 0500 07/09/21 0737 07/09/21 1145  Weight: 64.5 kg 63.6 kg 63.6 kg   General exam: Elderly female, moderately built and nourished sitting up comfortably in reclining chair. Respiratory system: Clear to auscultation. Respiratory effort normal. Cardiovascular system: S1 & S2 heard, RRR. No JVD, murmurs, rubs, gallops or clicks. No pedal edema.  Telemetry personally reviewed: Sinus rhythm. Gastrointestinal system: Abdomen is nondistended, soft and nontender. No organomegaly or masses felt. Normal bowel  sounds heard. Central nervous system: Alert and oriented.?  Subtle diminished right nasolabial fold compared to left.  Did not appreciate any significant dysarthria or aphasia during my exam. Extremities: Symmetric 5 x 5 power. Skin: No rashes, lesions or ulcers Psychiatry: Judgement and insight appear normal. Mood & affect appropriate.    Data Reviewed: I have personally reviewed following labs and imaging studies  CBC: Recent Labs  Lab 07/05/21 0716 07/06/21 0026 07/07/21 0233 07/08/21 0041 07/09/21 0802  WBC 8.9 10.3 9.8 9.5 6.0  HGB 10.4* 9.4* 9.0* 9.4* 8.8*  HCT 33.5* 29.7* 29.6* 30.9* 28.1*  MCV 93.1 91.7 93.1 93.6 93.0  PLT 114* 130* 123* 154 188   Basic Metabolic Panel: Recent Labs  Lab 07/04/21 1018 07/04/21 1234 07/05/21 0510 07/06/21 0026 07/07/21 0233 07/08/21 0041 07/09/21 0800  NA 134*   < > 140 138 138 138 139  K 4.4   < > 3.2* 3.5 3.9 3.8 3.5  CL 100  --  101 103 101 101 104  CO2 19*  --  24 23 27 25 26   GLUCOSE 205*  --  109* 106* 113* 90 95  BUN 28*  --  22 28* 17 26* 32*  CREATININE 5.84*  --  4.91* 5.85* 4.41* 6.02* 6.48*  CALCIUM 9.0  --  8.8* 8.6* 8.8* 8.9 8.7*  MG 2.0  --  1.9 2.1 2.0 2.2  --   PHOS  --   --   --  3.7  --   --  3.5   < > = values in this interval not displayed.   GFR: Estimated Creatinine Clearance: 6.3 mL/min (A) (by C-G formula based on SCr of 6.48 mg/dL (H)). Liver Function Tests: Recent Labs  Lab 07/04/21 1018 07/09/21 0800  AST 93*  --   ALT 60*  --   ALKPHOS 71  --   BILITOT 1.2  --   PROT 5.2*  --   ALBUMIN 2.7* 2.8*    Recent Labs  Lab 07/04/21 1018 07/05/21 0510  AMMONIA 68* 32    HbA1C: Recent Labs    07/10/21 0418  HGBA1C 5.7*   CBG: Recent Labs  Lab 07/09/21 0032 07/09/21 0434 07/09/21 1606 07/09/21 2132 07/10/21 0637  GLUCAP 116* 152* 113* 96 95   Lipid Profile: Recent Labs    07/10/21 0418  CHOL 139  HDL 48  LDLCALC 73  TRIG 92  CHOLHDL 2.9   Urine analysis:     Component Value Date/Time   COLORURINE YELLOW 07/04/2021 1205   APPEARANCEUR HAZY (A) 07/04/2021 1205   LABSPEC 1.015 07/04/2021 1205   PHURINE 8.5 (H) 07/04/2021 1205   GLUCOSEU >=500 (A) 07/04/2021 1205   HGBUR SMALL (A) 07/04/2021 1205   BILIRUBINUR NEGATIVE 07/04/2021 1205   [place] 07/04/2021 1205   PROTEINUR 100 (A) 07/04/2021 1205   NITRITE NEGATIVE 07/04/2021 1205   LEUKOCYTESUR NEGATIVE 07/04/2021 1205   Recent Results (from the past 240 hour(s))  Resp Panel by RT-PCR (Flu A&B, Covid) Nasopharyngeal Swab     Status: None   Collection Time: 07/04/21  9:41 AM   Specimen: Nasopharyngeal Swab; Nasopharyngeal(NP) swabs in vial transport medium  Result Value Ref Range Status   SARS Coronavirus 2 by RT PCR NEGATIVE NEGATIVE Final    Comment: (NOTE) SARS-CoV-2 target nucleic acids are NOT DETECTED.  The SARS-CoV-2 RNA is generally detectable in upper respiratory specimens during the acute phase of infection. The lowest concentration of SARS-CoV-2 viral copies this assay can detect is 138 copies/mL. A negative result does not preclude SARS-Cov-2 infection and should not be used as the sole basis for treatment or other patient management decisions. A negative result may occur with  improper specimen collection/handling, submission of specimen other than nasopharyngeal swab, presence of viral mutation(s) within the areas targeted by this assay, and inadequate number of viral copies(<138 copies/mL). A negative result must be combined with clinical observations, patient history, and epidemiological information. The expected result is Negative.  Fact Sheet for Patients:  EntrepreneurPulse.com.au  Fact Sheet for Healthcare Providers:  IncredibleEmployment.be  This test is no t yet approved or cleared by the [place] FDA and  has been authorized for detection and/or diagnosis of SARS-CoV-2 by FDA under an Emergency Use  Authorization (EUA). This EUA will remain  in effect (meaning this test can be used) for the duration of the COVID-19 declaration under Section 564(b)(1) of the Act, 21 U.S.C.section 360bbb-3(b)(1), unless the authorization is terminated  or revoked sooner.       Influenza A by PCR NEGATIVE NEGATIVE Final   Influenza B by PCR NEGATIVE NEGATIVE Final    Comment: (NOTE) The Xpert Xpress SARS-CoV-2/FLU/RSV plus assay is intended as an aid in the diagnosis of influenza from Nasopharyngeal swab specimens and should not be used as a sole basis for treatment. Nasal washings and aspirates are unacceptable for Xpert Xpress SARS-CoV-2/FLU/RSV testing.  Fact Sheet for Patients: EntrepreneurPulse.com.au  Fact Sheet for Healthcare Providers: IncredibleEmployment.be  This test is not yet approved or cleared by the  [place] Architectural technologist and has been authorized for detection and/or diagnosis of SARS-CoV-2 by FDA under an Print production planner (EUA). This EUA will remain in effect (meaning this test can be used) for the duration of the COVID-19 declaration under Section 564(b)(1) of the Act, 21 U.S.C. section 360bbb-3(b)(1), unless the authorization is terminated or revoked.  Performed at [facility], [place] [address], [place], [place] [address]   Culture, blood (routine x 2)     Status: None   Collection Time: 07/04/21 11:23 AM   Specimen: Right Antecubital; Blood  Result Value Ref Range Status   Specimen Description RIGHT ANTECUBITAL  Final   Special Requests BOTTLES DRAWN AEROBIC ONLY  Final   Culture   Final    NO GROWTH 5 DAYS Performed at [facility], [address], [place], [place] [address]    Report Status 07/09/2021 FINAL  Final  Culture, blood (routine x 2)     Status: None   Collection Time: 07/04/21 11:23 AM   Specimen: Right Antecubital; Blood  Result Value Ref Range Status   Specimen Description RIGHT ANTECUBITAL  Final    Special Requests ANAEROBIC  Final   Culture   Final    NO GROWTH 5 DAYS Performed at [facility], [place] [address], [place], [place] [address]    Report Status 07/09/2021 FINAL  Final  Respiratory (~20 pathogens) panel by PCR     Status: None   Collection Time: 07/04/21  1:15 PM   Specimen: Nasopharyngeal Swab; Respiratory  Result Value Ref Range Status   Adenovirus NOT DETECTED NOT DETECTED Final   Coronavirus 229E NOT DETECTED NOT DETECTED Final    Comment: (NOTE) The Coronavirus on the Respiratory Panel, DOES NOT test for the novel  Coronavirus (2019 nCoV)    Coronavirus HKU1 NOT DETECTED NOT DETECTED Final   Coronavirus NL63 NOT DETECTED NOT DETECTED Final   Coronavirus OC43 NOT DETECTED NOT DETECTED Final   Metapneumovirus NOT DETECTED NOT DETECTED Final   Rhinovirus / Enterovirus NOT DETECTED NOT DETECTED Final   Influenza A NOT DETECTED NOT DETECTED Final   Influenza B NOT DETECTED NOT DETECTED Final   Parainfluenza Virus 1 NOT DETECTED NOT DETECTED Final   Parainfluenza Virus 2 NOT DETECTED NOT DETECTED Final   Parainfluenza Virus 3 NOT DETECTED NOT DETECTED Final   Parainfluenza Virus 4 NOT DETECTED NOT DETECTED Final   Respiratory Syncytial Virus NOT DETECTED NOT DETECTED Final   Bordetella pertussis NOT DETECTED NOT DETECTED Final   Bordetella Parapertussis NOT DETECTED NOT DETECTED Final   Chlamydophila pneumoniae NOT DETECTED NOT DETECTED Final   Mycoplasma pneumoniae NOT DETECTED NOT DETECTED Final    Comment: Performed at [facility], [place] [address], [place], [place] [address]  MRSA Next Gen by PCR, Nasal     Status: None   Collection Time: 07/04/21  4:32 PM   Specimen: Nasal Mucosa; Nasal Swab  Result Value Ref Range Status   MRSA by PCR Next Gen NOT DETECTED NOT DETECTED Final    Comment: (NOTE) The GeneXpert MRSA Assay (FDA approved for NASAL specimens only), is one component of a comprehensive MRSA colonization surveillance program. It is not  intended to diagnose MRSA infection nor to guide or monitor treatment for MRSA infections. Test performance is not FDA approved in patients less than 28 years old. Performed at [facility], [place] [address], [place], [place] [address]       Radiology Studies: MR  ANGIO HEAD WO CONTRAST  Result Date: 07/09/2021 CLINICAL DATA:  Stroke follow-up EXAM: MRA HEAD WITHOUT CONTRAST TECHNIQUE: Angiographic images of the Circle of Willis were acquired using MRA technique without intravenous contrast. COMPARISON:  MRI 07/08/2021 FINDINGS: Evaluation is somewhat limited by motion artifact. Anterior circulation: Both internal carotid arteries are patent to the termini, although there is moderate to severe narrowing in the left supraclinoid portion (series 3, image 75). Hypoplastic or aplastic left A1. Normal right A1. Normal anterior communicating artery. Anterior cerebral arteries are patent to their distal aspects. No M1 stenosis or occlusion. Normal MCA bifurcations. Distal MCA branches perfused, although there is decreased perfusion in the left distal MCA branches, without focal stenosis or occlusion. Posterior circulation: Multifocal irregularity in the right vertebral artery, which appears occluded just proximal to the vertebrobasilar junction. The left vertebral artery is dominant and patent to the vertebrobasilar junction without stenosis. Basilar patent to its distal aspect. Superior cerebellar arteries patent bilaterally. PCAs perfused to their distal aspects without stenosis. The right posterior communicating artery is patent. The left posterior communicating artery is not definitively seen. Anatomic variants: None significant Other: None. IMPRESSION: 1. Evaluation is limited by motion artifact. Within this limitation, there is moderate to severe narrowing in the left supraclinoid ICA. Decreased perfusion in the left distal MCA branches, without focal MCA stenosis or occlusion. 2. Multifocal narrowing  of the right vertebral artery, which appears occluded just proximal to the vertebrobasilar junction. Electronically Signed   By: Merilyn Baba M.D.   On: 07/09/2021 20:15   MR BRAIN WO CONTRAST  Result Date: 07/08/2021 CLINICAL DATA:  Neuro deficit, acute, stroke suspected. EXAM: MRI HEAD WITHOUT CONTRAST TECHNIQUE: Multiplanar, multiecho pulse sequences of the brain and surrounding structures were obtained without intravenous contrast. COMPARISON:  Head CT yesterday. FINDINGS: Brain: Diffusion imaging shows approximately 10 punctate acute infarctions scattered within the left MCA territory consistent with micro embolic infarctions. No large or confluent acute infarction. No focal abnormality affects the brainstem or cerebellum. Old small vessel infarction in the right thalamus. Mild chronic small-vessel ischemic changes elsewhere within the cerebral hemispheric white matter. 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 No mass, hydrocephalus or extra-axial collection. Vascular: Major vessels at the base of the brain show flow. Skull and upper cervical spine: Negative Sinuses/Orbits: Clear/normal Other: Small right mastoid effusion. IMPRESSION: Approximately 10 punctate acute infarctions scattered within the left MCA territory consistent with micro embolic infarctions. 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 Mild chronic small-vessel ischemic changes elsewhere within the cerebral hemispheric white matter. Old lacunar infarction right thalamus. Electronically Signed   By: Nelson Chimes M.D.   On: 07/08/2021 19:52   EEG adult  Result Date: 07/09/2021 Lora Havens, MD     07/09/2021  8:15 AM Patient Name: Stephanye Bleich MRN: [id number] Epilepsy Attending: Lora Havens Referring Physician/Provider: Anibal Henderson, NP Date: 07/08/2021 Duration: 23.01 mins Patient history: 87 y.o. female who presented to the hospital 12/8 for evaluation of out-of-hospital  cardiac arrest with 15 minutes of CPR prior to ROSC.  EEG to evaluate for seizure. Level of alertness: Awake AEDs during EEG study: None Technical aspects: This EEG study was done with scalp electrodes positioned according to the 10-20 International system of electrode placement. Electrical activity was acquired at a sampling rate of 500Hz  and reviewed with a high frequency filter of 70Hz  and a low frequency filter of 1Hz . EEG data were recorded continuously and digitally stored. Description: The posterior dominant  rhythm consists of 9 Hz activity of moderate voltage (25-35 uV) seen predominantly in posterior head regions, symmetric and reactive to eye opening and eye closing. EEG showed intermittent generalized 3 to 6 Hz theta-delta slowing. Hyperventilation and photic stimulation were not performed.   ABNORMALITY - Intermittent slow, generalized IMPRESSION: This study is suggestive of mild diffuse encephalopathy, nonspecific etiology. No seizures or epileptiform discharges were seen throughout the recording. Priyanka O Yadav   VAS US CAROTID (at [facility] and [facility] only)  Result Date: 07/09/2021 Carotid Arterial Duplex Study Patient Name:  TERREN HABERLE  Date of Exam:   07/09/2021 Medical Rec #: [id number]      Accession #:    [id number] Date of Birth: 01-24-1933      Patient Gender: F Patient Age:   21 years Exam Location:  [facility] Procedure:      VAS US CAROTID Referring Phys: Amie Portland --------------------------------------------------------------------------------  Indications:       CVA. Risk Factors:      None. Limitations        Today's exam was limited due to the patient's respiratory                    variation and patient positioning, patient anatomy. Comparison Study:  No prior studies. Performing Technologist: Oliver Hum RVT  Examination Guidelines: A complete evaluation includes B-mode imaging, spectral Doppler, color Doppler, and power Doppler as needed of all accessible portions of  each vessel. Bilateral testing is considered an integral part of a complete examination. Limited examinations for reoccurring indications may be performed as noted.  Right Carotid Findings: +----------+--------+--------+--------+-----------------------+--------+             PSV cm/s EDV cm/s Stenosis Plaque Description      Comments  +----------+--------+--------+--------+-----------------------+--------+  CCA Prox   120      14                smooth and heterogenous           +----------+--------+--------+--------+-----------------------+--------+  CCA Distal 111      16                smooth and heterogenous           +----------+--------+--------+--------+-----------------------+--------+  ICA Prox   260      52       40-59%   calcific                          +----------+--------+--------+--------+-----------------------+--------+  ICA Mid    178      31                smooth and heterogenous           +----------+--------+--------+--------+-----------------------+--------+  ICA Distal 101      26                                        tortuous  +----------+--------+--------+--------+-----------------------+--------+  ECA        258      25                                                  +----------+--------+--------+--------+-----------------------+--------+ +----------+--------+-------+--------+-------------------+  PSV cm/s EDV cms Describe Arm Pressure (mmHG)  +----------+--------+-------+--------+-------------------+  Subclavian 113                                            +----------+--------+-------+--------+-------------------+ +---------+--------+--+--------+-+---------+  Vertebral PSV cm/s 61 EDV cm/s 7 Antegrade  +---------+--------+--+--------+-+---------+  Left Carotid Findings: +----------+--------+--------+--------+-----------------------+--------+             PSV cm/s EDV cm/s Stenosis Plaque Description      Comments   +----------+--------+--------+--------+-----------------------+--------+  CCA Prox   69       7                 smooth and heterogenous           +----------+--------+--------+--------+-----------------------+--------+  CCA Distal 75       10                smooth and heterogenous           +----------+--------+--------+--------+-----------------------+--------+  ICA Prox   68       14                smooth and heterogenous           +----------+--------+--------+--------+-----------------------+--------+  ICA Distal 62       17                                        tortuous  +----------+--------+--------+--------+-----------------------+--------+  ECA        148      10                                                  +----------+--------+--------+--------+-----------------------+--------+ +----------+--------+--------+--------+-------------------+             PSV cm/s EDV cm/s Describe Arm Pressure (mmHG)  +----------+--------+--------+--------+-------------------+  Subclavian 154                                             +----------+--------+--------+--------+-------------------+ +---------+--------+---+--------+--+---------+  Vertebral PSV cm/s 151 EDV cm/s 29 Antegrade  +---------+--------+---+--------+--+---------+   Summary: Right Carotid: Velocities in the right ICA are consistent with a 40-59%                stenosis. Left Carotid: Velocities in the left ICA are consistent with a 1-39% stenosis. Vertebrals: Bilateral vertebral arteries demonstrate antegrade flow. *See table(s) above for measurements and observations.  Electronically signed by Deitra Mayo MD on 07/09/2021 at 4:00:38 PM.    Final    VAS [place] LOWER EXTREMITY VENOUS (DVT)  Result Date: 07/09/2021  Lower Venous DVT Study Patient Name:  SHANDRELL BODA  Date of Exam:   07/09/2021 Medical Rec #: [id number]      Accession #:    [id number] Date of Birth: 07-05-1933      Patient Gender: F Patient Age:   21 years Exam Location:  [facility] Procedure:      VAS [place] LOWER EXTREMITY VENOUS (DVT) Referring Phys: Cornelius Moras XU --------------------------------------------------------------------------------  Indications: Stroke.  Risk Factors: None identified. Limitations: Poor  ultrasound/tissue interface and patient positioning. Comparison Study: No prior studies. Performing Technologist: Oliver Hum RVT  Examination Guidelines: A complete evaluation includes B-mode imaging, spectral Doppler, color Doppler, and power Doppler as needed of all accessible portions of each vessel. Bilateral testing is considered an integral part of a complete examination. Limited examinations for reoccurring indications may be performed as noted. The reflux portion of the exam is performed with the patient in reverse Trendelenburg.  +---------+---------------+---------+-----------+----------+--------------+  RIGHT     Compressibility Phasicity Spontaneity Properties Thrombus Aging  +---------+---------------+---------+-----------+----------+--------------+  CFV       Full            Yes       Yes                                    +---------+---------------+---------+-----------+----------+--------------+  SFJ       Full                                                             +---------+---------------+---------+-----------+----------+--------------+  FV Prox   Full                                                             +---------+---------------+---------+-----------+----------+--------------+  FV Mid    Full                                                             +---------+---------------+---------+-----------+----------+--------------+  FV Distal Full                                                             +---------+---------------+---------+-----------+----------+--------------+  PFV       Full                                                             +---------+---------------+---------+-----------+----------+--------------+  POP       Full             Yes       Yes                                    +---------+---------------+---------+-----------+----------+--------------+  PTV       Full                                                             +---------+---------------+---------+-----------+----------+--------------+  PERO      Full                                                             +---------+---------------+---------+-----------+----------+--------------+   +---------+---------------+---------+-----------+----------+--------------+  LEFT      Compressibility Phasicity Spontaneity Properties Thrombus Aging  +---------+---------------+---------+-----------+----------+--------------+  CFV       Full            Yes       Yes                                    +---------+---------------+---------+-----------+----------+--------------+  SFJ       Full                                                             +---------+---------------+---------+-----------+----------+--------------+  FV Prox   Full                                                             +---------+---------------+---------+-----------+----------+--------------+  FV Mid    Full            Yes       Yes                                    +---------+---------------+---------+-----------+----------+--------------+  FV Distal Full                                                             +---------+---------------+---------+-----------+----------+--------------+  PFV       Full                                                             +---------+---------------+---------+-----------+----------+--------------+  POP       Full            Yes       Yes                                    +---------+---------------+---------+-----------+----------+--------------+  PTV       Full                                                             +---------+---------------+---------+-----------+----------+--------------+  PERO      Full                                                              +---------+---------------+---------+-----------+----------+--------------+     Summary: RIGHT: - There is no evidence of deep vein thrombosis in the lower extremity. However, portions of this examination were limited- see technologist comments above.  - No cystic structure found in the popliteal fossa.  LEFT: - There is no evidence of deep vein thrombosis in the lower extremity. However, portions of this examination were limited- see technologist comments above.  - No cystic structure found in the popliteal fossa.  *See table(s) above for measurements and observations. Electronically signed by Deitra Mayo MD on 07/09/2021 at 4:00:19 PM.    Final     Scheduled Meds:   stroke: mapping our early stages of recovery book   Does not apply Once   allopurinol  100 mg Oral Daily   aspirin EC  81 mg Oral Daily   carvedilol  25 mg Oral 2 times per day on Sun Mon Wed Fri   Chlorhexidine Gluconate Cloth  6 each Topical Daily   cloNIDine  0.1 mg Oral BID   clopidogrel  75 mg Oral Daily   docusate sodium  100 mg Oral BID   feeding supplement (NEPRO CARB STEADY)  237 mL Oral BID BM   heparin  5,000 Units Subcutaneous Q8H   insulin aspart  0-9 Units Subcutaneous TID WC   multivitamin  1 tablet Oral QHS   NIFEdipine  30 mg Oral QHS   pantoprazole  40 mg Oral QHS   rOPINIRole  0.5 mg Oral QHS   senna  1 tablet Oral Daily   simvastatin  20 mg Oral QHS   Continuous Infusions:  sodium chloride Stopped (07/05/21 1832)   cefTRIAXone (ROCEPHIN)  IV 2 g (07/09/21 1236)   lactated ringers Stopped (07/04/21 1324)     LOS: 6 days   Vernell Leep, MD,  FACP, [facility], [facility], [facility] (Care Management Physician Certified) [place]  To contact the attending provider between 7A-7P or the covering provider during after hours 7P-7A, please log into the web site [email or web address] and access using universal [place] password for that web site. If you do not have the  password, please call the hospital operator.

## 2021-07-10 NOTE — Progress Notes (Signed)
Admit: 07/04/2021 LOS: 6  71F ESRD THS s/p cardiac arrest 07/04/21  Subjective: seen in room  Physical Exam:  Blood pressure (!) 155/86, pulse 70, temperature 98.1 F (36.7 C), temperature source Oral, resp. rate 17, height 5\' 9"  (1.753 m), weight 63.6 kg, SpO2 96 %. Alert, pleasant, word-finding problems  Regular, nl s1s2 Course bs/ bl No LEE LUE AVG +bruit   OP HD: [facility]  3.5h   64.5kg  2/2 bath  400/500   LUA AVG  Hep none  Sensipar 30 ug tiw po Hectorol 5 ug tiw IV Mircera 75 ug q 2wks , last 12/01  Venofer 50 mg q. weekly    Assessment/ Plan ESRD - TTS HD. HD tomorrow.   Acute CVA - L MCA by CT and MRI, neuro following PEA arrest - Out of hospital , per CCM Pneumonia/ resp failure- improved, getting IV ABX HTN - off clevidipine gtt; back on PO BB and CCB, clonidine added Anemia-  Hb low 9's, due for esa next HD will give tomorrow CKD-BMD- Ca ok, P ok   Kelly Splinter, MD 07/10/2021, 12:32 PM     Recent Labs  Lab 07/06/21 0026 07/07/21 0233 07/08/21 0041 07/09/21 0800  NA 138 138 138 139  K 3.5 3.9 3.8 3.5  CL 103 101 101 104  CO2 23 27 25 26   GLUCOSE 106* 113* 90 95  BUN 28* 17 26* 32*  CREATININE 5.85* 4.41* 6.02* 6.48*  CALCIUM 8.6* 8.8* 8.9 8.7*  PHOS 3.7  --   --  3.5    Recent Labs  Lab 07/07/21 0233 07/08/21 0041 07/09/21 0802  WBC 9.8 9.5 6.0  HGB 9.0* 9.4* 8.8*  HCT 29.6* 30.9* 28.1*  MCV 93.1 93.6 93.0  PLT 123* 154 176   Scheduled Meds:   stroke: mapping our early stages of recovery book   Does not apply Once   allopurinol  100 mg Oral Daily   aspirin EC  325 mg Oral Daily   Or   aspirin  300 mg Rectal Daily   carvedilol  25 mg Oral 2 times per day on Sun Mon Wed Fri   Chlorhexidine Gluconate Cloth  6 each Topical Daily   cloNIDine  0.1 mg Oral BID   docusate sodium  100 mg Oral BID   feeding supplement (NEPRO CARB STEADY)  237 mL Oral BID BM   heparin  5,000 Units Subcutaneous Q8H   insulin aspart  0-9 Units  Subcutaneous Q4H   multivitamin  1 tablet Oral QHS   NIFEdipine  30 mg Oral QHS   pantoprazole  40 mg Oral QHS   rOPINIRole  0.5 mg Oral QHS   senna  1 tablet Oral Daily   simvastatin  20 mg Oral QHS   Continuous Infusions:  sodium chloride Stopped (07/05/21 1832)   cefTRIAXone (ROCEPHIN)  IV 2 g (07/08/21 1213)   lactated ringers Stopped (07/04/21 1324)   PRN Meds:.sodium chloride, acetaminophen, bisacodyl, docusate sodium, hydrALAZINE, HYDROmorphone, HYDROmorphone, ibuprofen, ipratropium-albuterol, labetalol, melatonin, ondansetron (ZOFRAN) IV, phenol, polyethylene glycol

## 2021-07-10 NOTE — Plan of Care (Signed)
Patient without distress. Upon waking up patient was slightly disoriented but easily redirected. No complaints other than rib pain. Slept well this shift. Will continue to monitor. Problem: Education: Goal: Knowledge of General Education information will improve Description: Including pain rating scale, medication(s)/side effects and non-pharmacologic comfort measures Outcome: Progressing   Problem: Health Behavior/Discharge Planning: Goal: Ability to manage health-related needs will improve Outcome: Progressing   Problem: Clinical Measurements: Goal: Ability to maintain clinical measurements within normal limits will improve Outcome: Progressing Goal: Will remain free from infection Outcome: Progressing Goal: Diagnostic test results will improve Outcome: Progressing Goal: Respiratory complications will improve Outcome: Progressing Goal: Cardiovascular complication will be avoided Outcome: Progressing   Problem: Activity: Goal: Risk for activity intolerance will decrease Outcome: Progressing   Problem: Nutrition: Goal: Adequate nutrition will be maintained Outcome: Progressing   Problem: Coping: Goal: Level of anxiety will decrease Outcome: Progressing   Problem: Elimination: Goal: Will not experience complications related to bowel motility Outcome: Progressing Goal: Will not experience complications related to urinary retention Outcome: Progressing   Problem: Pain Managment: Goal: General experience of comfort will improve Outcome: Progressing   Problem: Safety: Goal: Ability to remain free from injury will improve Outcome: Progressing   Problem: Skin Integrity: Goal: Risk for impaired skin integrity will decrease Outcome: Progressing   Problem: Education: Goal: Knowledge of disease and its progression will improve Outcome: Progressing Goal: Individualized Educational Video(s) Outcome: Progressing   Problem: Fluid Volume: Goal: Compliance with measures to  maintain balanced fluid volume will improve Outcome: Progressing   Problem: Health Behavior/Discharge Planning: Goal: Ability to manage health-related needs will improve Outcome: Progressing   Problem: Nutritional: Goal: Ability to make healthy dietary choices will improve Outcome: Progressing   Problem: Clinical Measurements: Goal: Complications related to the disease process, condition or treatment will be avoided or minimized Outcome: Progressing   Problem: Education: Goal: Knowledge of disease or condition will improve Outcome: Progressing Goal: Knowledge of secondary prevention will improve (SELECT ALL) Outcome: Progressing Goal: Knowledge of patient specific risk factors will improve (INDIVIDUALIZE FOR PATIENT) Outcome: Progressing Goal: Individualized Educational Video(s) Outcome: Progressing

## 2021-07-10 NOTE — Progress Notes (Signed)
Physical Therapy Treatment Patient Details Name: Stacey Dorsey MRN: [id number] DOB: January 20, 1933 Today's Date: 07/10/2021   History of Present Illness Pt is an 87 y/o female presenting 12/8 after cardiac arrest requiring 15 min CPR prior to ROSC. Intubated prior to arrival, extubated 12/9. On 12/11, pt with new onset word-finding, rapid response called and neurology consulted. 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 PMHx includes: ESRD on HD, poorly controlled HTN, DM, HLD, (presumed) COPD.    PT Comments    Pt progressing towards physical therapy goals. Was able to progress ambulation distance with RW for support, however frequently required up to mod assist for recovery after losses of balance. Pt continues with scissoring gait pattern and significant drifting both R and L. AIR remains the most appropriate d/c disposition at this time to maximize functional independence and safety prior to return home with family support. Will continue to follow.     Recommendations for follow up therapy are one component of a multi-disciplinary discharge planning process, led by the attending physician.  Recommendations may be updated based on patient status, additional functional criteria and insurance authorization.  Follow Up Recommendations  Acute inpatient rehab (3hours/day)     Assistance Recommended at Discharge Frequent or constant Supervision/Assistance  Equipment Recommendations  BSC/3in1;Rolling walker (2 wheels)    Recommendations for Other Services Rehab consult     Precautions / Restrictions Precautions Precautions: Fall Restrictions Weight Bearing Restrictions: No     Mobility  Bed Mobility Overal bed mobility: Needs Assistance Bed Mobility: Supine to Sit;Sit to Supine     Supine to sit: Min assist Sit to  supine: Mod assist   General bed mobility comments: Increased time and difficulty returning to supine due to rib pain.    Transfers Overall transfer level: Needs assistance Equipment used: Rolling walker (2 wheels) Transfers: Sit to/from Stand Sit to Stand: Min assist           General transfer comment: VC's for hand placement on seated surface for safety. Increased time and effort to power up to full stand.    Ambulation/Gait Ambulation/Gait assistance: Min assist;Mod assist Gait Distance (Feet): 150 Feet Assistive device: Rolling walker (2 wheels) Gait Pattern/deviations: Step-through pattern;Decreased stride length;Shuffle;Scissoring;Ataxic;Narrow base of support Gait velocity: decreased Gait velocity interpretation: <1.31 ft/sec, indicative of household ambulator   General Gait Details: Pt drifting to L and R almost running into the walls or objects in the hall on both sides. Frequent scissoring with LLE and losses of balance requiring heavy mod assist to recover.   Stairs             Wheelchair Mobility    Modified Rankin (Stroke Patients Only) Modified Rankin (Stroke Patients Only) Pre-Morbid Rankin Score: No symptoms Modified Rankin: Moderately severe disability     Balance Overall balance assessment: Needs assistance Sitting-balance support: No upper extremity supported;Feet supported Sitting balance-Leahy Scale: Fair     Standing balance support: No upper extremity supported;During functional activity Standing balance-Leahy Scale: Poor Standing balance comment: Reliant on UE support with the RW                            Cognition Arousal/Alertness: Awake/alert Behavior During Therapy: WFL for tasks assessed/performed Overall Cognitive Status: Impaired/Different from baseline Area of Impairment: Attention;Following commands;Safety/judgement;Awareness;Problem solving;Memory  Orientation Level:  (oriented x4  today) Current Attention Level: Sustained Memory: Decreased recall of precautions;Decreased short-term memory Following Commands: Follows one step commands inconsistently;Follows one step commands with increased time Safety/Judgement: Decreased awareness of safety;Decreased awareness of deficits Awareness: Emergent Problem Solving: Requires verbal cues;Requires tactile cues;Difficulty sequencing          Exercises      General Comments        Pertinent Vitals/Pain Pain Assessment: Faces Faces Pain Scale: Hurts even more Pain Location: ribs with movement Pain Descriptors / Indicators: Sharp;Sore;Shooting Pain Intervention(s): Limited activity within patient's tolerance;Monitored during session;Repositioned    Home Living                          Prior Function            PT Goals (current goals can now be found in the care plan section) Acute Rehab PT Goals Patient Stated Goal: to return home PT Goal Formulation: With patient Time For Goal Achievement: 07/20/21 Potential to Achieve Goals: Good    Frequency    Min 4X/week      PT Plan Current plan remains appropriate;Frequency needs to be updated    Co-evaluation              AM-PAC PT "6 Clicks" Mobility   Outcome Measure  Help needed turning from your back to your side while in a flat bed without using bedrails?: A Little Help needed moving from lying on your back to sitting on the side of a flat bed without using bedrails?: A Lot Help needed moving to and from a bed to a chair (including a wheelchair)?: A Lot Help needed standing up from a chair using your arms (e.g., wheelchair or bedside chair)?: A Lot Help needed to walk in hospital room?: A Lot Help needed climbing 3-5 steps with a railing? : Total 6 Click Score: 12    End of Session Equipment Utilized During Treatment: Gait belt Activity Tolerance: Patient tolerated treatment well Patient left: in bed;with call bell/phone within  reach;with nursing/sitter in room Nurse Communication: Mobility status PT Visit Diagnosis: Unsteadiness on feet (R26.81);Difficulty in walking, not elsewhere classified (R26.2)     Time: 4332-9518 PT Time Calculation (min) (ACUTE ONLY): 25 min  Charges:  $Gait Training: 23-37 mins                     Stacey Dorsey, PT, DPT Acute Rehabilitation Services Pager: [phone] Office: [phone]    Stacey Dorsey 07/10/2021, 4:11 PM

## 2021-07-10 NOTE — Progress Notes (Signed)
Occupational Therapy Treatment Patient Details Name: Stacey Dorsey MRN: [id number] DOB: Oct 28, 1932 Today's Date: 07/10/2021   History of present illness 87 yo female presenting 12/8 after cardiac arrest requiring 15 min CPR prior to ROSC. Intubated PTA, extubated 12/9. On 12/11, pt with new onset word-finding, rapid response called and neurology consulted. MRI ordered 12/12 (approximately 10 punctate acute infarctions scattered within the  left MCA territory, old cortical and subcortical infarctions in the left frontal lobe and lacunar infarction right  thalamus. Marland Kitchen PMHx includes: ESRD on HD, poorly controlled HTN, DM, HLD, (presumed) COPD.   OT comments  This 87 yo female seen today to focus on bed mobility (min A with increased time, HOB up and use of rail), transfer from bed to commode in bathroom (min A with guidance of RW due to running into sink on right), toileting (min A sit<>stand), grooming standing at sink (min A for wash hands, propped on sink with arms). Pt is doing better than 2 days ago when I saw her, but still not at an independent to Mod I level she needs to be at to go home. She will continue to benefit from acute OT with follow up on CIR now recommended.   Recommendations for follow up therapy are one component of a multi-disciplinary discharge planning process, led by the attending physician.  Recommendations may be updated based on patient status, additional functional criteria and insurance authorization.    Follow Up Recommendations  Acute inpatient rehab (3hours/day)    Assistance Recommended at Discharge Frequent or constant Supervision/Assistance  Equipment Recommendations  None recommended by OT       Precautions / Restrictions Precautions Precautions: Fall Restrictions Weight Bearing Restrictions: No       Mobility Bed Mobility Overal bed mobility: Needs Assistance Bed Mobility: Supine to Sit   Sidelying to sit: Min assist;HOB elevated       General  bed mobility comments: bed rail and increased time due to rib pain and dizziness    Transfers Overall transfer level: Needs assistance Equipment used: Rolling walker (2 wheels) Transfers: Sit to/from Stand Sit to Stand: Min assist                 Balance Overall balance assessment: Needs assistance Sitting-balance support: No upper extremity supported;Feet supported Sitting balance-Leahy Scale: Fair     Standing balance support: No upper extremity supported;During functional activity Standing balance-Leahy Scale: Fair Standing balance comment: standing at sink to wash hands, however did prop on sink                           ADL either performed or assessed with clinical judgement   ADL Overall ADL's : Needs assistance/impaired Eating/Feeding: Set up Eating/Feeding Details (indicate cue type and reason): sittiing in recliner Grooming: Minimal assistance;Standing;Wash/dry hands                   Toilet Transfer: Minimal assistance;Ambulation;Rolling walker (2 wheels);Regular Toilet;Grab bars   Toileting- Clothing Manipulation and Hygiene: Minimal assistance;Sit to/from stand              Extremity/Trunk Assessment Upper Extremity Assessment LUE Deficits / Details: dialysis side, patient with decreased strength and AROM as well as decreased coordination LUE Coordination: decreased fine motor            Vision Baseline Vision/History: 0 No visual deficits Ability to See in Adequate Light: 2 Moderately impaired Vision Assessment?: Vision impaired- to be further tested in  functional context Additional Comments: Pt could not follow directions for visual testing, can move her eyes up/down/left/right when asked-but not follow object while it is moving. Running RW into sink on right on way to bathroom          Cognition Arousal/Alertness: Awake/alert Behavior During Therapy: [facility] for tasks assessed/performed Overall Cognitive Status:  Impaired/Different from baseline Area of Impairment: Attention;Following commands;Safety/judgement;Awareness;Problem solving                 Orientation Level:  (oriented x4 today) Current Attention Level: Sustained   Following Commands: Follows one step commands inconsistently;Follows one step commands with increased time Safety/Judgement: Decreased awareness of safety;Decreased awareness of deficits Awareness: Emergent Problem Solving: Requires verbal cues;Requires tactile cues;Difficulty sequencing General Comments: Pt had trouble following commands for visual testing, also noted to steer RW closer to objects on her right side.                     Pertinent Vitals/ Pain       Pain Assessment: Faces Faces Pain Scale: Hurts little more Pain Location: ribs with movement Pain Descriptors / Indicators: Sharp;Sore;Shooting Pain Intervention(s): Limited activity within patient's tolerance;Monitored during session;Repositioned         Frequency  Min 2X/week        Progress Toward Goals  OT Goals(current goals can now be found in the care plan section)  Progress towards OT goals: Progressing toward goals  Acute Rehab OT Goals Patient Stated Goal: to get better and figure out why this happened to me OT Goal Formulation: With patient Time For Goal Achievement: 07/19/21 Potential to Achieve Goals: Good  Plan Discharge plan needs to be updated       AM-PAC OT "6 Clicks" Daily Activity     Outcome Measure   Help from another person eating meals?: A Little Help from another person taking care of personal grooming?: A Little Help from another person toileting, which includes using toliet, bedpan, or urinal?: A Little Help from another person bathing (including washing, rinsing, drying)?: A Lot Help from another person to put on and taking off regular upper body clothing?: A Little Help from another person to put on and taking off regular lower body clothing?: A  Lot 6 Click Score: 16    End of Session Equipment Utilized During Treatment: Gait belt;Rolling walker (2 wheels)  OT Visit Diagnosis: Unsteadiness on feet (R26.81);Pain;Muscle weakness (generalized) (M62.81);Other symptoms and signs involving cognitive function;Low vision, both eyes (H54.2) Pain - part of body:  (chest)   Activity Tolerance Patient limited by fatigue   Patient Left in chair;with call bell/phone within reach;with chair alarm set   Nurse Communication  (called front desk and made them aware pt needed a new purewick)        Time: 0973-5329 OT Time Calculation (min): 36 min  Charges: OT General Charges $OT Visit: 1 Visit OT Treatments $Self Care/Home Management : 23-37 mins  [place] Circle, OTR/L Acute [employer] Pager [phone] Office [phone]    Almon Register 07/10/2021, 10:55 AM

## 2021-07-10 NOTE — Progress Notes (Signed)
STROKE TEAM PROGRESS NOTE   INTERVAL HISTORY Patient sitting in chair, no acute distress. No acute event overnight. She stated that she discussed about loop recorder with her daughter and they agree with it.   Vitals:   07/09/21 1145 07/10/21 0332 07/10/21 0756 07/10/21 1203  BP:  (!) 170/72 (!) 179/50 (!) 155/86  Pulse:  70 69 70  Resp:  18 17 17   Temp:  98.2 F (36.8 C) 98.7 F (37.1 C) 98.1 F (36.7 C)  TempSrc:  Oral Oral Oral  SpO2:  98% 98% 96%  Weight: 63.6 kg     Height:       CBC:  Recent Labs  Lab 07/08/21 0041 07/09/21 0802  WBC 9.5 6.0  HGB 9.4* 8.8*  HCT 30.9* 28.1*  MCV 93.6 93.0  PLT 154 962   Basic Metabolic Panel:  Recent Labs  Lab 07/06/21 0026 07/07/21 0233 07/08/21 0041 07/09/21 0800  NA 138 138 138 139  K 3.5 3.9 3.8 3.5  CL 103 101 101 104  CO2 23 27 25 26   GLUCOSE 106* 113* 90 95  BUN 28* 17 26* 32*  CREATININE 5.85* 4.41* 6.02* 6.48*  CALCIUM 8.6* 8.8* 8.9 8.7*  MG 2.1 2.0 2.2  --   PHOS 3.7  --   --  3.5   Lipid Panel:  Recent Labs  Lab 07/10/21 0418  CHOL 139  TRIG 92  HDL 48  CHOLHDL 2.9  VLDL 18  LDLCALC 73   HgbA1c:  Recent Labs  Lab 07/10/21 0418  HGBA1C 5.7*   Urine Drug Screen: No results for input(s): LABOPIA, COCAINSCRNUR, LABBENZ, AMPHETMU, THCU, LABBARB in the last 168 hours.  Alcohol Level No results for input(s): ETH in the last 168 hours.  IMAGING past 24 hours MR ANGIO HEAD WO CONTRAST  Result Date: 07/09/2021 CLINICAL DATA:  Stroke follow-up EXAM: MRA HEAD WITHOUT CONTRAST TECHNIQUE: Angiographic images of the Circle of Willis were acquired using MRA technique without intravenous contrast. COMPARISON:  MRI 07/08/2021 FINDINGS: Evaluation is somewhat limited by motion artifact. Anterior circulation: Both internal carotid arteries are patent to the termini, although there is moderate to severe narrowing in the left supraclinoid portion (series 3, image 75). Hypoplastic or aplastic left A1. Normal right A1.  Normal anterior communicating artery. Anterior cerebral arteries are patent to their distal aspects. No M1 stenosis or occlusion. Normal MCA bifurcations. Distal MCA branches perfused, although there is decreased perfusion in the left distal MCA branches, without focal stenosis or occlusion. Posterior circulation: Multifocal irregularity in the right vertebral artery, which appears occluded just proximal to the vertebrobasilar junction. The left vertebral artery is dominant and patent to the vertebrobasilar junction without stenosis. Basilar patent to its distal aspect. Superior cerebellar arteries patent bilaterally. PCAs perfused to their distal aspects without stenosis. The right posterior communicating artery is patent. The left posterior communicating artery is not definitively seen. Anatomic variants: None significant Other: None. IMPRESSION: 1. Evaluation is limited by motion artifact. Within this limitation, there is moderate to severe narrowing in the left supraclinoid ICA. Decreased perfusion in the left distal MCA branches, without focal MCA stenosis or occlusion. 2. Multifocal narrowing of the right vertebral artery, which appears occluded just proximal to the vertebrobasilar junction. Electronically Signed   By: Merilyn Baba M.D.   On: 07/09/2021 20:15   VAS US CAROTID (at [facility] and [facility] only)  Result Date: 07/09/2021 Carotid Arterial Duplex Study Patient Name:  Stacey Dorsey  Date of Exam:   07/09/2021 Medical  Rec #: [id number]      Accession #:    [id number] Date of Birth: [birth year]      Patient Gender: F Patient Age:   36 years Exam Location:  [facility] Procedure:      VAS US CAROTID Referring Phys: Amie [place] --------------------------------------------------------------------------------  Indications:       CVA. Risk Factors:      None. Limitations        Today's exam was limited due to the patient's respiratory                    variation and patient positioning, patient anatomy.  Comparison Study:  No prior studies. Performing Technologist: Oliver Hum RVT  Examination Guidelines: A complete evaluation includes B-mode imaging, spectral Doppler, color Doppler, and power Doppler as needed of all accessible portions of each vessel. Bilateral testing is considered an integral part of a complete examination. Limited examinations for reoccurring indications may be performed as noted.  Right Carotid Findings: +----------+--------+--------+--------+-----------------------+--------+             PSV cm/s EDV cm/s Stenosis Plaque Description      Comments  +----------+--------+--------+--------+-----------------------+--------+  CCA Prox   120      14                smooth and heterogenous           +----------+--------+--------+--------+-----------------------+--------+  CCA Distal 111      16                smooth and heterogenous           +----------+--------+--------+--------+-----------------------+--------+  ICA Prox   260      52       40-59%   calcific                          +----------+--------+--------+--------+-----------------------+--------+  ICA Mid    178      31                smooth and heterogenous           +----------+--------+--------+--------+-----------------------+--------+  ICA Distal 101      26                                        tortuous  +----------+--------+--------+--------+-----------------------+--------+  ECA        258      25                                                  +----------+--------+--------+--------+-----------------------+--------+ +----------+--------+-------+--------+-------------------+             PSV cm/s EDV cms Describe Arm Pressure (mmHG)  +----------+--------+-------+--------+-------------------+  Subclavian 113                                            +----------+--------+-------+--------+-------------------+ +---------+--------+--+--------+-+---------+  Vertebral PSV cm/s 61 EDV cm/s 7 Antegrade   +---------+--------+--+--------+-+---------+  Left Carotid Findings: +----------+--------+--------+--------+-----------------------+--------+             PSV cm/s EDV cm/s Stenosis Plaque Description  Comments  +----------+--------+--------+--------+-----------------------+--------+  CCA Prox   69       7                 smooth and heterogenous           +----------+--------+--------+--------+-----------------------+--------+  CCA Distal 75       10                smooth and heterogenous           +----------+--------+--------+--------+-----------------------+--------+  ICA Prox   68       14                smooth and heterogenous           +----------+--------+--------+--------+-----------------------+--------+  ICA Distal 62       17                                        tortuous  +----------+--------+--------+--------+-----------------------+--------+  ECA        148      10                                                  +----------+--------+--------+--------+-----------------------+--------+ +----------+--------+--------+--------+-------------------+             PSV cm/s EDV cm/s Describe Arm Pressure (mmHG)  +----------+--------+--------+--------+-------------------+  Subclavian 154                                             +----------+--------+--------+--------+-------------------+ +---------+--------+---+--------+--+---------+  Vertebral PSV cm/s 151 EDV cm/s 29 Antegrade  +---------+--------+---+--------+--+---------+   Summary: Right Carotid: Velocities in the right ICA are consistent with a 40-59%                stenosis. Left Carotid: Velocities in the left ICA are consistent with a 1-39% stenosis. Vertebrals: Bilateral vertebral arteries demonstrate antegrade flow. *See table(s) above for measurements and observations.  Electronically signed by Deitra Mayo MD on 07/09/2021 at 4:00:38 PM.    Final    VAS [place] LOWER EXTREMITY VENOUS (DVT)  Result Date: 07/09/2021  Lower Venous DVT Study  Patient Name:  Stacey Dorsey  Date of Exam:   07/09/2021 Medical Rec #: [id number]      Accession #:    [id number] Date of Birth: [birth year]      Patient Gender: F Patient Age:   38 years Exam Location:  [facility] Procedure:      VAS [place] LOWER EXTREMITY VENOUS (DVT) Referring Phys: Cornelius Moras Malakhi Markwood --------------------------------------------------------------------------------  Indications: Stroke.  Risk Factors: None identified. Limitations: Poor ultrasound/tissue interface and patient positioning. Comparison Study: No prior studies. Performing Technologist: Oliver Hum RVT  Examination Guidelines: A complete evaluation includes B-mode imaging, spectral Doppler, color Doppler, and power Doppler as needed of all accessible portions of each vessel. Bilateral testing is considered an integral part of a complete examination. Limited examinations for reoccurring indications may be performed as noted. The reflux portion of the exam is performed with the patient in reverse Trendelenburg.  +---------+---------------+---------+-----------+----------+--------------+  RIGHT     Compressibility Phasicity Spontaneity Properties Thrombus Aging  +---------+---------------+---------+-----------+----------+--------------+  CFV  Full            Yes       Yes                                    +---------+---------------+---------+-----------+----------+--------------+  SFJ       Full                                                             +---------+---------------+---------+-----------+----------+--------------+  FV Prox   Full                                                             +---------+---------------+---------+-----------+----------+--------------+  FV Mid    Full                                                             +---------+---------------+---------+-----------+----------+--------------+  FV Distal Full                                                              +---------+---------------+---------+-----------+----------+--------------+  PFV       Full                                                             +---------+---------------+---------+-----------+----------+--------------+  POP       Full            Yes       Yes                                    +---------+---------------+---------+-----------+----------+--------------+  PTV       Full                                                             +---------+---------------+---------+-----------+----------+--------------+  PERO      Full                                                             +---------+---------------+---------+-----------+----------+--------------+   +---------+---------------+---------+-----------+----------+--------------+  LEFT      Compressibility Phasicity Spontaneity Properties Thrombus Aging  +---------+---------------+---------+-----------+----------+--------------+  CFV       Full            Yes       Yes                                    +---------+---------------+---------+-----------+----------+--------------+  SFJ       Full                                                             +---------+---------------+---------+-----------+----------+--------------+  FV Prox   Full                                                             +---------+---------------+---------+-----------+----------+--------------+  FV Mid    Full            Yes       Yes                                    +---------+---------------+---------+-----------+----------+--------------+  FV Distal Full                                                             +---------+---------------+---------+-----------+----------+--------------+  PFV       Full                                                             +---------+---------------+---------+-----------+----------+--------------+  POP       Full            Yes       Yes                                     +---------+---------------+---------+-----------+----------+--------------+  PTV       Full                                                             +---------+---------------+---------+-----------+----------+--------------+  PERO      Full                                                             +---------+---------------+---------+-----------+----------+--------------+  Summary: RIGHT: - There is no evidence of deep vein thrombosis in the lower extremity. However, portions of this examination were limited- see technologist comments above.  - No cystic structure found in the popliteal fossa.  LEFT: - There is no evidence of deep vein thrombosis in the lower extremity. However, portions of this examination were limited- see technologist comments above.  - No cystic structure found in the popliteal fossa.  *See table(s) above for measurements and observations. Electronically signed by Deitra Mayo MD on 07/09/2021 at 4:00:19 PM.    Final     PHYSICAL EXAM  Temp:  [98.1 F (36.7 C)-98.7 F (37.1 C)] 98.1 F (36.7 C) (12/14 1203) Pulse Rate:  [69-80] 70 (12/14 1203) Resp:  [16-18] 17 (12/14 1203) BP: (155-179)/(50-86) 155/86 (12/14 1203) SpO2:  [95 %-99 %] 96 % (12/14 1203)  General - Well nourished, well developed, in no apparent distress.  Ophthalmologic - fundi not visualized due to noncooperation.  Cardiovascular - Regular rhythm and rate.  Neuro - awake alert, oriented x3, mild to moderate dysarthria, however, no aphasia, follows simple commands, able to name and repeat.  Visual fields full, no gaze palsy, facial symmetrical.  Lateral upper and lower extremity 4/5, sensation symmetrical, no sensory neglect.  Bilateral finger-to-nose slow but grossly intact. Gait not tested   ASSESSMENT/PLAN Ms. Saran Schnitzler is a 87 y.o. female with history of ESRD on hemodialysis, poorly controlled hypertension, diabetes myelitis type II, hyperlipidemia, COPD with chronic oxygen presenting  s/p cardiac arrest.  Patient was extubated 12/9.  Patient was noted to have intermittent aphasia on 12/11 and 212/12 with a stable CT head.  On the morning of 12/12 the patient's daughter was speaking with her on the phone and could not understand what she was saying.  MRI noted left MCA punctate infarcts that appear microembolic.   Stroke:  Left MCA scattered several punctate infarcts, etiology unclear, possibly cardioembolic vs large vessel disease of left tICA stenosis Code Stroke- stable left frontal encephalomalacia Carotid ultrasound-pending Lower extremity venous duplex-pending MRI  approximately 10 punctate acute infarctions scattered within the left MCA territory consistent with embolic infarctions.  Old cortical and subcortical infarctions on the left frontal lobe. Old lacunar infarction in the right thalamus MRA head moderate to severe narrowing in the left supraclinoid ICA. Decreased perfusion in the left distal MCA branches. Multifocal narrowing of the right vertebral artery, which appears occluded just proximal to the vertebrobasilar junction 2D Echo LVEF 50 to 55% LE venous doppler neg CUS right ICA 40-59% stenosis Recommend loop recorder to rule out afib. EP will further discuss with pt and family LDL 73 HgbA1c 5.7 VTE prophylaxis - Heparin subq No antithrombotic prior to admission, now on Plavix and aspirin 325 DAPT for 3 months and then ASA alone given left tICA severe stenosis. Therapy recommendations: CIR Disposition:  Pending  Cardiac arrest PEA cardiac arrest, ROSC after 15 minutes ? Presumed trigger pneumonia/hypoxia  Hypertension Home meds: Coreg 25 mg, nifedipine 30 mg Stable Long-term BP goal normotensive Avoid low BP during dialysis  Hyperlipidemia Home meds: Simvastatin 20 mg, resumed in hospital LDL 73, goal < 70 Continue statin at discharge  Diabetes type II Controlled Home meds: None HgbA1c 5.7, goal < 7.0 CBGs [facility] PCP follow  up  Other Stroke Risk Factors Advanced Age >/= 73   Other Active Problems ESRD with AKI Hemodialysis per nephrology Rib fractures Pain managed by primary.  Adequately controlled with as needed medications. RLS Ropinirole  Hospital day # 6  Neurology will sign off. Please call with questions. Pt will follow up with stroke clinic NP at [facility] in about 4 weeks. Thanks for the consult.   Rosalin Hawking, MD PhD Stroke Neurology 07/10/2021 3:14 PM    To contact Stroke Continuity provider, please refer to [email or web address] After hours, contact General Neurology

## 2021-07-10 NOTE — Progress Notes (Signed)
Gave patient 1mg  of dilaudid. This was 1/2 a tablet. The other half was misplaced or thrown in trash. RN looked everywhere and was unable to locate it. Charge is aware.

## 2021-07-11 ENCOUNTER — Other Ambulatory Visit: Payer: Self-pay | Admitting: Student

## 2021-07-11 DIAGNOSIS — R002 Palpitations: Secondary | ICD-10-CM

## 2021-07-11 DIAGNOSIS — I639 Cerebral infarction, unspecified: Secondary | ICD-10-CM

## 2021-07-11 LAB — RENAL FUNCTION PANEL
Albumin: 2.8 g/dL — ABNORMAL LOW (ref 3.5–5.0)
Anion gap: 9 (ref 5–15)
BUN: 37 mg/dL — ABNORMAL HIGH (ref 8–23)
CO2: 27 mmol/L (ref 22–32)
Calcium: 9 mg/dL (ref 8.9–10.3)
Chloride: 104 mmol/L (ref 98–111)
Creatinine, Ser: 6.45 mg/dL — ABNORMAL HIGH (ref 0.44–1.00)
GFR, Estimated: 6 mL/min — ABNORMAL LOW (ref >60)
Glucose, Bld: 95 mg/dL (ref 70–99)
Phosphorus: 4.2 mg/dL (ref 2.5–4.6)
Potassium: 4.1 mmol/L (ref 3.5–5.1)
Sodium: 140 mmol/L (ref 135–145)

## 2021-07-11 LAB — CBC
HCT: 31.6 % — ABNORMAL LOW (ref 36.0–46.0)
Hemoglobin: 10.1 g/dL — ABNORMAL LOW (ref 12.0–15.0)
MCH: 29.1 pg (ref 26.0–34.0)
MCHC: 32 g/dL (ref 30.0–36.0)
MCV: 91.1 fL (ref 80.0–100.0)
Platelets: 192 10*3/uL (ref 150–400)
RBC: 3.47 MIL/uL — ABNORMAL LOW (ref 3.87–5.11)
RDW: 16.9 % — ABNORMAL HIGH (ref 11.5–15.5)
WBC: 7.7 10*3/uL (ref 4.0–10.5)
nRBC: 0 % (ref 0.0–0.2)

## 2021-07-11 LAB — GLUCOSE, CAPILLARY
Glucose-Capillary: 137 mg/dL — ABNORMAL HIGH (ref 70–99)
Glucose-Capillary: 105 mg/dL — ABNORMAL HIGH (ref 70–99)
Glucose-Capillary: 212 mg/dL — ABNORMAL HIGH (ref 70–99)
Glucose-Capillary: 125 mg/dL — ABNORMAL HIGH (ref 70–99)

## 2021-07-11 LAB — HEPATIC FUNCTION PANEL
ALT: 16 U/L (ref 0–44)
AST: 24 U/L (ref 15–41)
Albumin: 2.8 g/dL — ABNORMAL LOW (ref 3.5–5.0)
Alkaline Phosphatase: 75 U/L (ref 38–126)
Bilirubin, Direct: <0.1 mg/dL (ref 0.0–0.2)
Total Bilirubin: 0.5 mg/dL (ref 0.3–1.2)
Total Protein: 5.9 g/dL — ABNORMAL LOW (ref 6.5–8.1)

## 2021-07-11 MED ORDER — ACETAMINOPHEN 500 MG PO TABS
1000.0000 mg | ORAL_TABLET | Freq: Three times a day (TID) | ORAL | Status: DC
  Administered 2021-07-11 – 2021-07-16 (×14): 1000 mg via ORAL
  Filled 2021-07-11 (×15): qty 2

## 2021-07-11 NOTE — Progress Notes (Signed)
PT Cancellation Note  Patient Details Name: Stacey Dorsey MRN: [id number] DOB: Jan 25, 1933   Cancelled Treatment:    Reason Eval/Treat Not Completed: Pain limiting ability to participate;Fatigue/lethargy limiting ability to participate. Pt in HD this morning, and currently reports feeling "wiped out". Pt asking to rest at this time. Will check back as schedule allows to continue with PT POC.    Thelma Comp 07/11/2021, 3:05 PM  Rolinda Roan, PT, DPT Acute Rehabilitation Services Pager: [phone] Office: [phone]

## 2021-07-11 NOTE — Progress Notes (Signed)
°  Reviewed case with Dr. Quentin Ore.   PEA arrest in likely in setting of PNA/Hypoxia along with multiple complicated co-morbidities including ESRD on HD.   Acute left MCA stroke/encephalomalacia further defined by MRI as multiple microembolic infarcts in setting of left terminal ICA stenosis on MRA, potentially large vessel disease contributing.   We think risk of acute decompensation or event is high and would recommend 30 day event monitor with outpatient follow up pending course and recovery to further discuss possibility of loop monitoring in the future.   Legrand Como [address]" [place], PA-C  07/11/2021 10:10 AM

## 2021-07-11 NOTE — Progress Notes (Addendum)
PROGRESS NOTE  Stacey Dorsey  IRJ:[id number] DOB: [birth year] DOA: 07/04/2021 PCP: Mindi Curling, PA-C   Brief Narrative: Stacey Dorsey is an 87 yo F hx ESRD on HD, poorly controlled HTN, T2DM, HLD, (presumed) COPD on chronic supplemental oxygen who presented to [facility] 12/8 following cardiac arrest. Patient was in usual state of health until 12/8 morning when she felt short of breath while getting ready to go to dialysis. EMS was called, on EMS arrival pt was altered and subsequently sustained witnessed cardiac arrest -- PEA. Received 15 min CPR, 1 epi, 1g calcium, 50mg  bicarb, as well as 189mcg fent and  5mg  versed. King placed in field which was exchanged for ETT in ED. CXR revealed right-sided airspace disease. Opacities confirmed on CTA chest which showed no PE, suggested volume overload, and multiple bilateral rib fractures. CT head showed only encephalomalacia from old anterior MCA-territory stroke. Started on rocephin and azithromycin for pneumonia. Echocardiogram revealed LVEF 50-55%, G1DD, normal RV size and function, severely elevated PASP with estimated RVSP of 61.46mmHg. Volume status has been regulated by hemodialysis. Ultimately was extubated 12/9 and transferred out of ICU 12/11. CT head on that date due to memory difficulties was stable from admission. Due to persistence of symptoms, MRI brain was performed showing multiple infarcts suspected to be due to microemboli during resuscitation. Neurology is consulted and further work up is ordered.  Assessment & Plan: Principal Problem:   Cardiac arrest [facility]) Active Problems:   Malnutrition of moderate degree   Cerebral embolism with cerebral infarction  OOH PEA cardiac arrest: ROSC after 15 minutes. With asymmetric infiltrates, preceding dyspnea and preserved LVEF, pneumonia/hypoxia is presumptive trigger.  -Remains in sinus rhythm on monitor. -CTA chest 12/8: No PE. -2D echo 12/8: LVEF 50-55%.  Rib fractures:  -CTA chest 12/8 showed  acute fractures of the right fourth and sixth through eighth ribs and left sixth through ninth ribs. - Continue pain medication: Changed oxycodone prn to hydromorphone prn due to ESRD.  Still reports bilateral rib cage pain, especially on deep breathing changing positions.  Changed acetaminophen to 1 g 3 times daily.  If no improvement, could consider lidocaine patches. - Pulmonary hygiene.   Acute metabolic encephalopathy, acute stroke: Suspected to be delirium as well as expressive aphasia due to stroke. EEG 12/12 was suggestive of mild, nonspecific, diffuse encephalopathy without seizures or epileptiform discharges.  - CT head suggested anterior left MCA stroke/encephalomalacia further defined by MRI as multiple microembolic infarcts in that distribution.  Neurology/stroke MD input appreciated: Left MCA scattered several punctate infarcts, etiology unclear but possibly cardioembolic versus cryptogenic.  MRI brain showed approximately 10 punctate acute infarction scattered within the left MCA territory consistent with microembolic infarctions.  MRA head: Moderate to severe narrowing in the left supraclinoid ICA.  Decreased perfusion in the left distal MCA branches, without focal MCA stenosis or occlusion.  Multifocal narrowing of the right vertebral artery, which appears occluded just proximal to the vertebrobasilar junction.  CUS: Right ICA 40-59% stenosis, left ICA 1-39% stenosis. B/L LEV Dopplers: No DVT.  TTE: LVEF 30-16%, grade 1 diastolic dysfunction.  A1c 5.7.  LDL 73.  SLP and OT recommend CIR, consulted.  Per neurology, patient and family have agreed on a loop recorder and neurology is consulting EP cardiology for placement of same.  No antithrombotics PTA, now on DAPT with aspirin and Plavix.  As per EP cardiology input, not an appropriate candidate for loop recorder and recommend 30-day event monitor with outpatient follow-up pending course and recovery  to further discuss possibility of loop  monitoring in the future.  Contacted [facility] card master to arrange same.  Acute on chronic hypoxic respiratory failure: Due to right-sided CAP vs. aspiration pneumonia - Continue antibiotics x7 days per PCCM.  Completed IV ceftriaxone 12/15. - Continue aggressive pulmonary hygiene - Consider outpatient PFTs/pulmonary follow up. -Currently saturating in the high 90s on room air.  ESRD:  - Continue HD TTS per nephrology.  Seen at HD 12/15  HTN:  - Continue coreg, nifedipine. Clonidine was added, will monitor BP trends, avoid hypotension.  Blood pressure is high, volume management across HD.  Yet to get this morning's meds.  T2DM: HbA1c 5.7 %. Would not attempt too rigid of control to avoid neuroglycopenia.  - SSI   AOCKD and iron deficiency anemia:  - Per nephrology. Continue monitoring. -Hemoglobin has dropped from 9.4-8.8 in the absence of overt bleeding.  May be a lab error.  Hemoglobin up to 10.1.  Gout: Chronic, quiescent.  - Continue allopurinol.  RLS:  - Continue ropinirole  HLD:  - Continue statin  Moderate protein-calorie malnutrition:  - MVM, protein supplementation  Constipation -Initiated bowel regimen pain  DVT prophylaxis: Heparin  Code Status: Full Family Communication: Discussed with daughter.  Disposition Plan:  Status is: Inpatient  Remains inpatient appropriate because: AMS, neurological deficit.  Consultants:  PCCM Neurology  Procedures:  Echo  Antimicrobials: Ceftriaxone, azithromycin   Subjective: Seen at HD this morning.  Reports that she has "confusion at times".  Also indicates bilateral lower anterior rib cage pain, worse with deep breathing and on turning.  Dry lips and asking for some moisturizer.  Objective: Vitals:   07/11/21 0750 07/11/21 0807 07/11/21 0830 07/11/21 0900  BP: (!) 178/111 (!) 185/65 (!) 189/160 (!) 188/79  Pulse: 67 67 (!) 58 67  Resp: (!) 23 20 (!) 21 19  Temp: 98.4 F (36.9 C)     TempSrc: Oral     SpO2:       Weight: 63.2 kg     Height:        Intake/Output Summary (Last 24 hours) at 07/11/2021 5374 Last data filed at 07/10/2021 1234 Gross per 24 hour  Intake 295 ml  Output --  Net 295 ml   Filed Weights   07/09/21 1145 07/11/21 0500 07/11/21 0750  Weight: 63.6 kg 66 kg 63.2 kg   General exam: Elderly female, moderately built and nourished lying comfortably propped up in bed undergoing HD via aVF in left upper arm. Respiratory system: Clear to auscultation.  No increased work of breathing.  Reproducible bilateral lower anterior rib cage pain. Cardiovascular system: S1 & S2 heard, RRR. No JVD, murmurs, rubs, gallops or clicks. No pedal edema.  Telemetry personally reviewed: Sinus rhythm. Gastrointestinal system: Abdomen is nondistended, soft and nontender.  Normal bowel sounds heard. Central nervous system: Alert and oriented.?  Subtle diminished right nasolabial fold compared to left.  Did not appreciate any significant dysarthria or aphasia during my exam. Extremities: Symmetric 5 x 5 power. Skin: No rashes, lesions or ulcers Psychiatry: Judgement and insight appear normal. Mood & affect appropriate.    Data Reviewed: I have personally reviewed following labs and imaging studies  CBC: Recent Labs  Lab 07/06/21 0026 07/07/21 0233 07/08/21 0041 07/09/21 0802 07/11/21 0303  WBC 10.3 9.8 9.5 6.0 7.7  HGB 9.4* 9.0* 9.4* 8.8* 10.1*  HCT 29.7* 29.6* 30.9* 28.1* 31.6*  MCV 91.7 93.1 93.6 93.0 91.1  PLT 130* 123* 154 176 192  Basic Metabolic Panel: Recent Labs  Lab 07/04/21 1018 07/04/21 1234 07/05/21 0510 07/06/21 0026 07/07/21 0233 07/08/21 0041 07/09/21 0800  NA 134*   < > 140 138 138 138 139  K 4.4   < > 3.2* 3.5 3.9 3.8 3.5  CL 100  --  101 103 101 101 104  CO2 19*  --  24 23 27 25 26   GLUCOSE 205*  --  109* 106* 113* 90 95  BUN 28*  --  22 28* 17 26* 32*  CREATININE 5.84*  --  4.91* 5.85* 4.41* 6.02* 6.48*  CALCIUM 9.0  --  8.8* 8.6* 8.8* 8.9 8.7*  MG 2.0  --   1.9 2.1 2.0 2.2  --   PHOS  --   --   --  3.7  --   --  3.5   < > = values in this interval not displayed.   GFR: Estimated Creatinine Clearance: 6.2 mL/min (A) (by C-G formula based on SCr of 6.48 mg/dL (H)). Liver Function Tests: Recent Labs  Lab 07/04/21 1018 07/09/21 0800  AST 93*  --   ALT 60*  --   ALKPHOS 71  --   BILITOT 1.2  --   PROT 5.2*  --   ALBUMIN 2.7* 2.8*    Recent Labs  Lab 07/04/21 1018 07/05/21 0510  AMMONIA 68* 32    HbA1C: Recent Labs    07/10/21 0418  HGBA1C 5.7*   CBG: Recent Labs  Lab 07/10/21 0637 07/10/21 1212 07/10/21 1642 07/10/21 2114 07/11/21 0604  GLUCAP 95 191* 128* 184* 137*   Lipid Profile: Recent Labs    07/10/21 0418  CHOL 139  HDL 48  LDLCALC 73  TRIG 92  CHOLHDL 2.9   Urine analysis:    Component Value Date/Time   COLORURINE YELLOW 07/04/2021 1205   APPEARANCEUR HAZY (A) 07/04/2021 1205   LABSPEC 1.015 07/04/2021 1205   PHURINE 8.5 (H) 07/04/2021 1205   GLUCOSEU >=500 (A) 07/04/2021 1205   HGBUR SMALL (A) 07/04/2021 1205   BILIRUBINUR NEGATIVE 07/04/2021 1205   [place] 07/04/2021 1205   PROTEINUR 100 (A) 07/04/2021 1205   NITRITE NEGATIVE 07/04/2021 1205   LEUKOCYTESUR NEGATIVE 07/04/2021 1205   Recent Results (from the past 240 hour(s))  Resp Panel by RT-PCR (Flu A&B, Covid) Nasopharyngeal Swab     Status: None   Collection Time: 07/04/21  9:41 AM   Specimen: Nasopharyngeal Swab; Nasopharyngeal(NP) swabs in vial transport medium  Result Value Ref Range Status   SARS Coronavirus 2 by RT PCR NEGATIVE NEGATIVE Final    Comment: (NOTE) SARS-CoV-2 target nucleic acids are NOT DETECTED.  The SARS-CoV-2 RNA is generally detectable in upper respiratory specimens during the acute phase of infection. The lowest concentration of SARS-CoV-2 viral copies this assay can detect is 138 copies/mL. A negative result does not preclude SARS-Cov-2 infection and should not be used as the sole basis for  treatment or other patient management decisions. A negative result may occur with  improper specimen collection/handling, submission of specimen other than nasopharyngeal swab, presence of viral mutation(s) within the areas targeted by this assay, and inadequate number of viral copies(<138 copies/mL). A negative result must be combined with clinical observations, patient history, and epidemiological information. The expected result is Negative.  Fact Sheet for Patients:  EntrepreneurPulse.com.au  Fact Sheet for Healthcare Providers:  IncredibleEmployment.be  This test is no t yet approved or cleared by the [place] FDA and  has been authorized for detection and/or  diagnosis of SARS-CoV-2 by FDA under an Emergency Use Authorization (EUA). This EUA will remain  in effect (meaning this test can be used) for the duration of the COVID-19 declaration under Section 564(b)(1) of the Act, 21 U.S.C.section 360bbb-3(b)(1), unless the authorization is terminated  or revoked sooner.       Influenza A by PCR NEGATIVE NEGATIVE Final   Influenza B by PCR NEGATIVE NEGATIVE Final    Comment: (NOTE) The Xpert Xpress SARS-CoV-2/FLU/RSV plus assay is intended as an aid in the diagnosis of influenza from Nasopharyngeal swab specimens and should not be used as a sole basis for treatment. Nasal washings and aspirates are unacceptable for Xpert Xpress SARS-CoV-2/FLU/RSV testing.  Fact Sheet for Patients: EntrepreneurPulse.com.au  Fact Sheet for Healthcare Providers: IncredibleEmployment.be  This test is not yet approved or cleared by the [place] FDA and has been authorized for detection and/or diagnosis of SARS-CoV-2 by FDA under an Emergency Use Authorization (EUA). This EUA will remain in effect (meaning this test can be used) for the duration of the COVID-19 declaration under Section 564(b)(1) of the Act, 21  U.S.C. section 360bbb-3(b)(1), unless the authorization is terminated or revoked.  Performed at [facility], [place] [address] [place], [place] [address]   Culture, blood (routine x 2)     Status: None   Collection Time: 07/04/21 11:23 AM   Specimen: Right Antecubital; Blood  Result Value Ref Range Status   Specimen Description RIGHT ANTECUBITAL  Final   Special Requests BOTTLES DRAWN AEROBIC ONLY  Final   Culture   Final    NO GROWTH 5 DAYS Performed at [facility], [address], [place], [place] [address]    Report Status 07/09/2021 FINAL  Final  Culture, blood (routine x 2)     Status: None   Collection Time: 07/04/21 11:23 AM   Specimen: Right Antecubital; Blood  Result Value Ref Range Status   Specimen Description RIGHT ANTECUBITAL  Final   Special Requests ANAEROBIC  Final   Culture   Final    NO GROWTH 5 DAYS Performed at [facility], [place] [address], [place], [place] [address]    Report Status 07/09/2021 FINAL  Final  Respiratory (~20 pathogens) panel by PCR     Status: None   Collection Time: 07/04/21  1:15 PM   Specimen: Nasopharyngeal Swab; Respiratory  Result Value Ref Range Status   Adenovirus NOT DETECTED NOT DETECTED Final   Coronavirus 229E NOT DETECTED NOT DETECTED Final    Comment: (NOTE) The Coronavirus on the Respiratory Panel, DOES NOT test for the novel  Coronavirus (2019 nCoV)    Coronavirus HKU1 NOT DETECTED NOT DETECTED Final   Coronavirus NL63 NOT DETECTED NOT DETECTED Final   Coronavirus OC43 NOT DETECTED NOT DETECTED Final   Metapneumovirus NOT DETECTED NOT DETECTED Final   Rhinovirus / Enterovirus NOT DETECTED NOT DETECTED Final   Influenza A NOT DETECTED NOT DETECTED Final   Influenza B NOT DETECTED NOT DETECTED Final   Parainfluenza Virus 1 NOT DETECTED NOT DETECTED Final   Parainfluenza Virus 2 NOT DETECTED NOT DETECTED Final   Parainfluenza Virus 3 NOT DETECTED NOT DETECTED Final   Parainfluenza Virus 4 NOT DETECTED  NOT DETECTED Final   Respiratory Syncytial Virus NOT DETECTED NOT DETECTED Final   Bordetella pertussis NOT DETECTED NOT DETECTED Final   Bordetella Parapertussis NOT DETECTED NOT DETECTED Final   Chlamydophila pneumoniae NOT DETECTED NOT DETECTED Final   Mycoplasma pneumoniae NOT DETECTED NOT  DETECTED Final    Comment: Performed at [facility], [place] [address], [place], [place] [address]  MRSA Next Gen by PCR, Nasal     Status: None   Collection Time: 07/04/21  4:32 PM   Specimen: Nasal Mucosa; Nasal Swab  Result Value Ref Range Status   MRSA by PCR Next Gen NOT DETECTED NOT DETECTED Final    Comment: (NOTE) The GeneXpert MRSA Assay (FDA approved for NASAL specimens only), is one component of a comprehensive MRSA colonization surveillance program. It is not intended to diagnose MRSA infection nor to guide or monitor treatment for MRSA infections. Test performance is not FDA approved in patients less than 48 years old. Performed at [facility], [place] [address], [place], [place] [address]       Radiology Studies: MR ANGIO HEAD WO CONTRAST  Result Date: 07/09/2021 CLINICAL DATA:  Stroke follow-up EXAM: MRA HEAD WITHOUT CONTRAST TECHNIQUE: Angiographic images of the Circle of Willis were acquired using MRA technique without intravenous contrast. COMPARISON:  MRI 07/08/2021 FINDINGS: Evaluation is somewhat limited by motion artifact. Anterior circulation: Both internal carotid arteries are patent to the termini, although there is moderate to severe narrowing in the left supraclinoid portion (series 3, image 75). Hypoplastic or aplastic left A1. Normal right A1. Normal anterior communicating artery. Anterior cerebral arteries are patent to their distal aspects. No M1 stenosis or occlusion. Normal MCA bifurcations. Distal MCA branches perfused, although there is decreased perfusion in the left distal MCA branches, without focal stenosis or occlusion. Posterior circulation: Multifocal  irregularity in the right vertebral artery, which appears occluded just proximal to the vertebrobasilar junction. The left vertebral artery is dominant and patent to the vertebrobasilar junction without stenosis. Basilar patent to its distal aspect. Superior cerebellar arteries patent bilaterally. PCAs perfused to their distal aspects without stenosis. The right posterior communicating artery is patent. The left posterior communicating artery is not definitively seen. Anatomic variants: None significant Other: None. IMPRESSION: 1. Evaluation is limited by motion artifact. Within this limitation, there is moderate to severe narrowing in the left supraclinoid ICA. Decreased perfusion in the left distal MCA branches, without focal MCA stenosis or occlusion. 2. Multifocal narrowing of the right vertebral artery, which appears occluded just proximal to the vertebrobasilar junction. Electronically Signed   By: Merilyn Baba M.D.   On: 07/09/2021 20:15   VAS US CAROTID (at [facility] and [facility] only)  Result Date: 07/09/2021 Carotid Arterial Duplex Study Patient Name:  Stacey Dorsey  Date of Exam:   07/09/2021 Medical Rec #: [id number]      Accession #:    [id number] Date of Birth: 1933-02-04      Patient Gender: F Patient Age:   89 years Exam Location:  [facility] Procedure:      VAS US CAROTID Referring Phys: Amie Portland --------------------------------------------------------------------------------  Indications:       CVA. Risk Factors:      None. Limitations        Today's exam was limited due to the patient's respiratory                    variation and patient positioning, patient anatomy. Comparison Study:  No prior studies. Performing Technologist: Oliver Hum RVT  Examination Guidelines: A complete evaluation includes B-mode imaging, spectral Doppler, color Doppler, and power Doppler as needed of all accessible portions of each vessel. Bilateral testing is considered an integral part of a complete  examination. Limited examinations for reoccurring indications  may be performed as noted.  Right Carotid Findings: +----------+--------+--------+--------+-----------------------+--------+             PSV cm/s EDV cm/s Stenosis Plaque Description      Comments  +----------+--------+--------+--------+-----------------------+--------+  CCA Prox   120      14                smooth and heterogenous           +----------+--------+--------+--------+-----------------------+--------+  CCA Distal 111      16                smooth and heterogenous           +----------+--------+--------+--------+-----------------------+--------+  ICA Prox   260      52       40-59%   calcific                          +----------+--------+--------+--------+-----------------------+--------+  ICA Mid    178      31                smooth and heterogenous           +----------+--------+--------+--------+-----------------------+--------+  ICA Distal 101      26                                        tortuous  +----------+--------+--------+--------+-----------------------+--------+  ECA        258      25                                                  +----------+--------+--------+--------+-----------------------+--------+ +----------+--------+-------+--------+-------------------+             PSV cm/s EDV cms Describe Arm Pressure (mmHG)  +----------+--------+-------+--------+-------------------+  Subclavian 113                                            +----------+--------+-------+--------+-------------------+ +---------+--------+--+--------+-+---------+  Vertebral PSV cm/s 61 EDV cm/s 7 Antegrade  +---------+--------+--+--------+-+---------+  Left Carotid Findings: +----------+--------+--------+--------+-----------------------+--------+             PSV cm/s EDV cm/s Stenosis Plaque Description      Comments  +----------+--------+--------+--------+-----------------------+--------+  CCA Prox   69       7                 smooth and heterogenous            +----------+--------+--------+--------+-----------------------+--------+  CCA Distal 75       10                smooth and heterogenous           +----------+--------+--------+--------+-----------------------+--------+  ICA Prox   68       14                smooth and heterogenous           +----------+--------+--------+--------+-----------------------+--------+  ICA Distal 62       17  tortuous  +----------+--------+--------+--------+-----------------------+--------+  ECA        148      10                                                  +----------+--------+--------+--------+-----------------------+--------+ +----------+--------+--------+--------+-------------------+             PSV cm/s EDV cm/s Describe Arm Pressure (mmHG)  +----------+--------+--------+--------+-------------------+  Subclavian 154                                             +----------+--------+--------+--------+-------------------+ +---------+--------+---+--------+--+---------+  Vertebral PSV cm/s 151 EDV cm/s 29 Antegrade  +---------+--------+---+--------+--+---------+   Summary: Right Carotid: Velocities in the right ICA are consistent with a 40-59%                stenosis. Left Carotid: Velocities in the left ICA are consistent with a 1-39% stenosis. Vertebrals: Bilateral vertebral arteries demonstrate antegrade flow. *See table(s) above for measurements and observations.  Electronically signed by Deitra Mayo MD on 07/09/2021 at 4:00:38 PM.    Final    VAS [place] LOWER EXTREMITY VENOUS (DVT)  Result Date: 07/09/2021  Lower Venous DVT Study Patient Name:  Stacey Dorsey  Date of Exam:   07/09/2021 Medical Rec #: [id number]      Accession #:    [id number] Date of Birth: 12-Dec-1932      Patient Gender: F Patient Age:   27 years Exam [facility]  [facility] Procedure:      VAS [place] LOWER EXTREMITY VENOUS (DVT) Referring Phys: Cornelius Moras XU  --------------------------------------------------------------------------------  Indications: Stroke.  Risk Factors: None identified. Limitations: Poor ultrasound/tissue interface and patient positioning. Comparison Study: No prior studies. Performing Technologist: Oliver Hum RVT  Examination Guidelines: A complete evaluation includes B-mode imaging, spectral Doppler, color Doppler, and power Doppler as needed of all accessible portions of each vessel. Bilateral testing is considered an integral part of a complete examination. Limited examinations for reoccurring indications may be performed as noted. The reflux portion of the exam is performed with the patient in reverse Trendelenburg.  +---------+---------------+---------+-----------+----------+--------------+  RIGHT     Compressibility Phasicity Spontaneity Properties Thrombus Aging  +---------+---------------+---------+-----------+----------+--------------+  CFV       Full            Yes       Yes                                    +---------+---------------+---------+-----------+----------+--------------+  SFJ       Full                                                             +---------+---------------+---------+-----------+----------+--------------+  FV Prox   Full                                                             +---------+---------------+---------+-----------+----------+--------------+  FV Mid    Full                                                             +---------+---------------+---------+-----------+----------+--------------+  FV Distal Full                                                             +---------+---------------+---------+-----------+----------+--------------+  PFV       Full                                                             +---------+---------------+---------+-----------+----------+--------------+  POP       Full            Yes       Yes                                     +---------+---------------+---------+-----------+----------+--------------+  PTV       Full                                                             +---------+---------------+---------+-----------+----------+--------------+  PERO      Full                                                             +---------+---------------+---------+-----------+----------+--------------+   +---------+---------------+---------+-----------+----------+--------------+  LEFT      Compressibility Phasicity Spontaneity Properties Thrombus Aging  +---------+---------------+---------+-----------+----------+--------------+  CFV       Full            Yes       Yes                                    +---------+---------------+---------+-----------+----------+--------------+  SFJ       Full                                                             +---------+---------------+---------+-----------+----------+--------------+  FV Prox   Full                                                             +---------+---------------+---------+-----------+----------+--------------+  FV Mid    Full            Yes       Yes                                    +---------+---------------+---------+-----------+----------+--------------+  FV Distal Full                                                             +---------+---------------+---------+-----------+----------+--------------+  PFV       Full                                                             +---------+---------------+---------+-----------+----------+--------------+  POP       Full            Yes       Yes                                    +---------+---------------+---------+-----------+----------+--------------+  PTV       Full                                                             +---------+---------------+---------+-----------+----------+--------------+  PERO      Full                                                              +---------+---------------+---------+-----------+----------+--------------+     Summary: RIGHT: - There is no evidence of deep vein thrombosis in the lower extremity. However, portions of this examination were limited- see technologist comments above.  - No cystic structure found in the popliteal fossa.  LEFT: - There is no evidence of deep vein thrombosis in the lower extremity. However, portions of this examination were limited- see technologist comments above.  - No cystic structure found in the popliteal fossa.  *See table(s) above for measurements and observations. Electronically signed by Deitra Mayo MD on 07/09/2021 at 4:00:19 PM.    Final     Scheduled Meds:   stroke: mapping our early stages of recovery book   Does not apply Once   allopurinol  100 mg Oral Daily   aspirin EC  325 mg Oral Daily   carvedilol  25 mg Oral 2 times per day on Sun Mon Wed Fri   Chlorhexidine Gluconate Cloth  6 each Topical Daily   cloNIDine  0.1 mg Oral BID   clopidogrel  75 mg Oral Daily   darbepoetin (ARANESP) injection - DIALYSIS  60 mcg Intravenous Q Thu-HD   docusate sodium  100 mg  Oral BID   feeding supplement (NEPRO CARB STEADY)  237 mL Oral BID BM   heparin  5,000 Units Subcutaneous Q8H   insulin aspart  0-9 Units Subcutaneous TID WC   multivitamin  1 tablet Oral QHS   NIFEdipine  30 mg Oral QHS   pantoprazole  40 mg Oral QHS   polyethylene glycol  17 g Oral Daily   rOPINIRole  0.5 mg Oral QHS   senna  2 tablet Oral Daily   simvastatin  20 mg Oral QHS   Continuous Infusions:  sodium chloride Stopped (07/05/21 1832)   cefTRIAXone (ROCEPHIN)  IV 2 g (07/10/21 1234)   lactated ringers Stopped (07/04/21 1324)     LOS: 7 days   Vernell Leep, MD,  FACP, [facility], [facility], [facility] (Care Management Physician Certified) [place]  To contact the attending provider between 7A-7P or the covering provider during after hours 7P-7A, please log into the web site  [email or web address] and access using universal [place] password for that web site. If you do not have the password, please call the hospital operator.

## 2021-07-11 NOTE — Progress Notes (Signed)
SLP Cancellation Note  Patient Details Name: Stacey Dorsey MRN: [id number] DOB: 12-27-1932   Cancelled treatment:       Reason Eval/Treat Not Completed: Patient at procedure or test/unavailable. Pt in HD this am. Will f/u as able.    Osie Bond., M.A. [facility] Pager [phone] Office [phone]  07/11/2021, 12:29 PM

## 2021-07-11 NOTE — Addendum Note (Signed)
Addended by: Barrington Ellison A on: 07/11/2021 10:55 AM   Modules accepted: Orders

## 2021-07-11 NOTE — Progress Notes (Signed)
Patient with complaints of chest pain during hemodialysis. Blood flow rate decreased.Vital signs obtained bp 188/79 HR 67. Patient on 2 liters of oxygen with oxygen saturation of 100% respirations 17. Dr. Jonnie Finner notified. Telephone orders given for stat ekg to be obtained. EKG obtained. Dr. Jonnie Finner came to bedside and assessed patient. Verbal orders to stop ultrafiltration. UF turned off. Patient  alert requesting Tylenol for pain.

## 2021-07-11 NOTE — Progress Notes (Signed)
Nutrition Follow-up  DOCUMENTATION CODES:   Underweight, Non-severe (moderate) malnutrition in context of chronic illness  INTERVENTION:  Recommend initiation of bowel regimen as no BM documented since 12/07.   Continue to recommend liberalization of diet to regular given malnutrition and consistently inadequate PO intake  -Continue renal mvi daily -Continue Nepro Shake po BID, each supplement provides 425 kcal and 19 grams protein  NUTRITION DIAGNOSIS:   Moderate Malnutrition related to chronic illness (ESRD, COPD) as evidenced by moderate fat depletion, severe muscle depletion.  ongoing  GOAL:   Patient will meet greater than or equal to 90% of their needs  progressing  MONITOR:   Diet advancement, Supplement acceptance, Labs, Weight trends, I & O's  REASON FOR ASSESSMENT:   Ventilator, Consult Enteral/tube feeding initiation and management  ASSESSMENT:   87 year old female who presented to the ED on 12/08 after a PEA arrest. Pt required intubation in the ED. PMH of ESRD on HD, HTN, T2DM, HLD, COPD.  12/09 - extubated 12/11 - tx to floor  MRI brain was performed and revealed multiple infarcts suspected to be due to microemboli during resuscitation per MD. Neurology is following.   Pt unavailable at time of RD visit, in HD. Per Nephrology, no UF goal today. Net UF from HD on 12/13 was 1L.   PO Intake: 20-30% x 2 recorded meals   Recommend initiation of bowel regimen as no BM documented since 12/07.   No UOP documented x24 hours I/O: -225ml since admit  Pt currently under EDW and will need it updated at discharge EDW: 64.5 kg Current weight: 63.2 kg  Medications: Scheduled Meds:   stroke: mapping our early stages of recovery book   Does not apply Once   allopurinol  100 mg Oral Daily   aspirin EC  325 mg Oral Daily   carvedilol  25 mg Oral 2 times per day on Sun Mon Wed Fri   Chlorhexidine Gluconate Cloth  6 each Topical Daily   cloNIDine  0.1 mg Oral  BID   clopidogrel  75 mg Oral Daily   darbepoetin (ARANESP) injection - DIALYSIS  60 mcg Intravenous Q Thu-HD   docusate sodium  100 mg Oral BID   feeding supplement (NEPRO CARB STEADY)  237 mL Oral BID BM   heparin  5,000 Units Subcutaneous Q8H   insulin aspart  0-9 Units Subcutaneous TID WC   multivitamin  1 tablet Oral QHS   NIFEdipine  30 mg Oral QHS   pantoprazole  40 mg Oral QHS   polyethylene glycol  17 g Oral Daily   rOPINIRole  0.5 mg Oral QHS   senna  2 tablet Oral Daily   simvastatin  20 mg Oral QHS  Continuous Infusions:  sodium chloride Stopped (07/05/21 1832)   cefTRIAXone (ROCEPHIN)  IV 2 g (07/10/21 1234)   lactated ringers Stopped (07/04/21 1324)    Labs: Recent Labs  Lab 07/06/21 0026 07/07/21 0233 07/08/21 0041 07/09/21 0800 07/11/21 0840  NA 138 138 138 139 140  K 3.5 3.9 3.8 3.5 4.1  CL 103 101 101 104 104  CO2 23 27 25 26 27   BUN 28* 17 26* 32* 37*  CREATININE 5.85* 4.41* 6.02* 6.48* 6.45*  CALCIUM 8.6* 8.8* 8.9 8.7* 9.0  MG 2.1 2.0 2.2  --   --   PHOS 3.7  --   --  3.5 4.2  GLUCOSE 106* 113* 90 95 95   CBGs: 105-191 x24 hours    Diet Order:  Diet Order             Diet renal with fluid restriction Fluid restriction: 1200 mL Fluid; Room service appropriate? Yes; Fluid consistency: Thin  Diet effective now                   EDUCATION NEEDS:   Education needs have been addressed  Skin:  Skin Assessment: Reviewed RN Assessment  Last BM:  12/7  Height:   Ht Readings from Last 1 Encounters:  07/04/21 5\' 9"  (1.753 m)    Weight:   Wt Readings from Last 1 Encounters:  07/11/21 63.2 kg     BMI:  Body mass index is 20.58 kg/m.  Estimated Nutritional Needs:   Kcal:  1450-1650  Protein:  70-85 grams  Fluid:  1000 ml + UOP     Alyric Parkin A., MS, RD, LDN (she/her/hers) RD pager number and weekend/on-call pager number located in [place].

## 2021-07-11 NOTE — Progress Notes (Signed)
[facility] Progress Note   Subjective: Seen in HD unit. C/o exertional cp/dyspnea. Worse with deep breath. Normal EKG this am. No UF on HD.   Objective Vitals:   07/11/21 0900 07/11/21 0930 07/11/21 1000 07/11/21 1030  BP: (!) 188/79 (!) 198/79 (!) 182/63 (!) 180/74  Pulse: 67 70 66 68  Resp: 19 16 18 15   Temp:      TempSrc:      SpO2:      Weight:      Height:         Additional Objective Labs: Basic Metabolic Panel: Recent Labs  Lab 07/06/21 0026 07/07/21 0233 07/08/21 0041 07/09/21 0800 07/11/21 0840  NA 138   < > 138 139 140  K 3.5   < > 3.8 3.5 4.1  CL 103   < > 101 104 104  CO2 23   < > 25 26 27   GLUCOSE 106*   < > 90 95 95  BUN 28*   < > 26* 32* 37*  CREATININE 5.85*   < > 6.02* 6.48* 6.45*  CALCIUM 8.6*   < > 8.9 8.7* 9.0  PHOS 3.7  --   --  3.5 4.2   < > = values in this interval not displayed.   CBC: Recent Labs  Lab 07/06/21 0026 07/07/21 0233 07/08/21 0041 07/09/21 0802 07/11/21 0303  WBC 10.3 9.8 9.5 6.0 7.7  HGB 9.4* 9.0* 9.4* 8.8* 10.1*  HCT 29.7* 29.6* 30.9* 28.1* 31.6*  MCV 91.7 93.1 93.6 93.0 91.1  PLT 130* 123* 154 176 192   Blood Culture    Component Value Date/Time   SDES RIGHT ANTECUBITAL 07/04/2021 1123   SDES RIGHT ANTECUBITAL 07/04/2021 1123   SPECREQUEST BOTTLES DRAWN AEROBIC ONLY 07/04/2021 1123   SPECREQUEST ANAEROBIC 07/04/2021 1123   CULT  07/04/2021 1123    NO GROWTH 5 DAYS Performed at [facility], [place] [address] [place], [place] [address]    CULT  07/04/2021 1123    NO GROWTH 5 DAYS Performed at [facility], [place] [address] [place], [place] [address]    REPTSTATUS 07/09/2021 FINAL 07/04/2021 1123   REPTSTATUS 07/09/2021 FINAL 07/04/2021 1123     Physical Exam General: Elderly woman, frail, on nasal oxygen  Heart: RRR. No m,r,g  Lungs: Scattered wheeze  Abdomen: Soft non-tender  Extremities: No sig LE edema  Dialysis Access: LUE AVG +bruit   Medications:  sodium chloride  Stopped (07/05/21 1832)   cefTRIAXone (ROCEPHIN)  IV 2 g (07/10/21 1234)   lactated ringers Stopped (07/04/21 1324)     stroke: mapping our early stages of recovery book   Does not apply Once   allopurinol  100 mg Oral Daily   aspirin EC  325 mg Oral Daily   carvedilol  25 mg Oral 2 times per day on Sun Mon Wed Fri   Chlorhexidine Gluconate Cloth  6 each Topical Daily   cloNIDine  0.1 mg Oral BID   clopidogrel  75 mg Oral Daily   darbepoetin (ARANESP) injection - DIALYSIS  60 mcg Intravenous Q Thu-HD   docusate sodium  100 mg Oral BID   feeding supplement (NEPRO CARB STEADY)  237 mL Oral BID BM   heparin  5,000 Units Subcutaneous Q8H   insulin aspart  0-9 Units Subcutaneous TID WC   multivitamin  1 tablet Oral QHS   NIFEdipine  30 mg Oral QHS   pantoprazole  40 mg Oral QHS   polyethylene  glycol  17 g Oral Daily   rOPINIRole  0.5 mg Oral QHS   senna  2 tablet Oral Daily   simvastatin  20 mg Oral QHS    Dialysis Orders:  [facility]  3.5h   64.5kg  2/2 bath  400/500   LUA AVG  Hep none  Sensipar 30 ug tiw po Hectorol 5 ug tiw IV Mircera 75 ug q 2wks , last 12/01  Venofer 50 mg q. weekly  Assessment/Plan: ESRD - TTS HD. HD today on schedule.  Acute CVA - L MCA by CT and MRI, neuro following. Microembolic infarctions. EP consulted for loop recorder PEA arrest - Out of hospital, per primary  Pneumonia/ resp failure- improved, getting IV ABX HTN - Elevated BP. off clevidipine gtt; back on PO BB and CCB, clonidine added. Is at outpatient dry weight.  Anemia-  Hb l9/10. Aranesp 60 given on 12/15.  CKD-BMD- Ca ok, P ok    Lynnda Child PA-C [facility] 07/11/2021,11:00 AM

## 2021-07-11 NOTE — Progress Notes (Signed)
Inpatient Rehab Admissions Coordinator:   Met with patient at the bedside and spoke to her daughter over the phone to discuss CIR goals and expectations.  I let them know that estimated length of stay would be about 2 weeks, but dependent on progress may not be as long.  That goals would be supervision level for mobility and pt would complete 3 hrs/day of therapy.  They are both on board and I let them know that we could start insurance auth process today.    Shann Medal, PT, DPT Admissions Coordinator [phone] 07/11/21  4:10 PM

## 2021-07-12 DIAGNOSIS — R0789 Other chest pain: Secondary | ICD-10-CM

## 2021-07-12 LAB — GLUCOSE, CAPILLARY
Glucose-Capillary: 125 mg/dL — ABNORMAL HIGH (ref 70–99)
Glucose-Capillary: 161 mg/dL — ABNORMAL HIGH (ref 70–99)
Glucose-Capillary: 106 mg/dL — ABNORMAL HIGH (ref 70–99)
Glucose-Capillary: 190 mg/dL — ABNORMAL HIGH (ref 70–99)

## 2021-07-12 LAB — TROPONIN I (HIGH SENSITIVITY)
Troponin I (High Sensitivity): 51 ng/L — ABNORMAL HIGH (ref <18)
Troponin I (High Sensitivity): 45 ng/L — ABNORMAL HIGH (ref <18)

## 2021-07-12 MED ORDER — NIFEDIPINE ER OSMOTIC RELEASE 60 MG PO TB24
60.0000 mg | ORAL_TABLET | Freq: Every day | ORAL | Status: DC
  Administered 2021-07-12 – 2021-07-15 (×4): 60 mg via ORAL
  Filled 2021-07-12 (×5): qty 1

## 2021-07-12 NOTE — Progress Notes (Signed)
Speech Language Pathology Treatment: Cognitive-Linquistic  Patient Details Name: Stacey Dorsey MRN: [id number] DOB: 04-03-1933 Today's Date: 07/12/2021 Time: [id number] SLP Time Calculation (min) (ACUTE ONLY): 27 min  Assessment / Plan / Recommendation Clinical Impression  Stacey Dorsey demonstrates improved communication with improved word retrieval and repetition. She continues to display phonemic paraphasias during conversation.Divergent naming tasks within given category led to range of 6-8 items named.  Pt concerned about inability to state great-grandchildren's names: she reviewed and practiced while looking through photos on her phone with verbal cues for names of associated family members.  Pt is energetic and motivated to go to rehab.  SLP will continue to follow.   HPI HPI: 87 yo female presenting 12/8 after cardiac arrest requiring 15 min CPR prior to ROSC. Intubated PTA, extubated 12/9. On 12/11, pt with new onset word-finding, and MRI revealed approximately 10 punctate acute infarctions in the L frontal lobe. PMH includes: ESRD on HD, poorly controlled HTN, DM, HLD, (presumed      SLP Plan  Continue with current plan of care      Recommendations for follow up therapy are one component of a multi-disciplinary discharge planning process, led by the attending physician.  Recommendations may be updated based on patient status, additional functional criteria and insurance authorization.    Recommendations                   Follow Up Recommendations: Acute inpatient rehab (3hours/day) Assistance recommended at discharge: Intermittent Supervision/Assistance SLP Visit Diagnosis: Aphasia (R47.01) Plan: Continue with current plan of care         Stacey Dorsey L. Stacey Dorsey, [place] Office number [phone] Pager [phone]   Stacey Dorsey  07/12/2021, 2:49 PM

## 2021-07-12 NOTE — Progress Notes (Signed)
PROGRESS NOTE  Stacey Dorsey  XFG:[id number] DOB: [birth year] DOA: 07/04/2021 PCP: Mindi Curling, PA-C   Brief Narrative: Stacey Dorsey is an 87 yo F hx ESRD on HD, poorly controlled HTN, T2DM, HLD, (presumed) COPD on chronic supplemental oxygen who presented to [facility] 12/8 following cardiac arrest. Patient was in usual state of health until 12/8 morning when she felt short of breath while getting ready to go to dialysis. EMS was called, on EMS arrival pt was altered and subsequently sustained witnessed cardiac arrest -- PEA. Received 15 min CPR, 1 epi, 1g calcium, 50mg  bicarb, as well as 116mcg fent and  5mg  versed. King placed in field which was exchanged for ETT in ED. CXR revealed right-sided airspace disease. Opacities confirmed on CTA chest which showed no PE, suggested volume overload, and multiple bilateral rib fractures. CT head showed only encephalomalacia from old anterior MCA-territory stroke. Started on rocephin and azithromycin for pneumonia. Echocardiogram revealed LVEF 50-55%, G1DD, normal RV size and function, severely elevated PASP with estimated RVSP of 61.12mmHg. Volume status has been regulated by hemodialysis. Ultimately was extubated 12/9 and transferred out of ICU 12/11. CT head on that date due to memory difficulties was stable from admission. Due to persistence of symptoms, MRI brain was performed showing multiple infarcts suspected to be due to microemboli during resuscitation. Neurology is consulted and further work up is ordered.  Assessment & Plan: Principal Problem:   Cardiac arrest [facility]) Active Problems:   Malnutrition of moderate degree   Cerebral embolism with cerebral infarction   OOH PEA cardiac arrest: ROSC after 15 minutes. With asymmetric infiltrates, preceding dyspnea and preserved LVEF, pneumonia/hypoxia is presumptive trigger.  -Remains in sinus rhythm on monitor. -CTA chest 12/8: No PE. -2D echo 12/8: LVEF 50-55%.  Rib fractures with musculoskeletal chest  pain:  -CTA chest 12/8 showed acute fractures of the right fourth and sixth through eighth ribs and left sixth through ninth ribs. - Continue pain medication: Changed oxycodone prn to hydromorphone prn due to ESRD.  Still reports bilateral rib cage pain, especially on deep breathing changing positions.  Changed acetaminophen to 1 g 3 times daily.  If no improvement, could consider lidocaine patches. - Pulmonary hygiene.  -Patient reports that her pain is now better controlled on this multimodality treatment.  Encouraged incentive spirometry, discussed with RN to provide same. -As per nephrology, patient had transient chest pain at HD yesterday, EKG without acute changes.  Suspect this is likely musculoskeletal.  However to be sure, checked high-sensitivity troponin which is appropriately down from 330 on 12/8 to 51 on 12/16.  Acute metabolic encephalopathy, acute stroke: Suspected to be delirium as well as expressive aphasia due to stroke. EEG 12/12 was suggestive of mild, nonspecific, diffuse encephalopathy without seizures or epileptiform discharges.  - CT head suggested anterior left MCA stroke/encephalomalacia further defined by MRI as multiple microembolic infarcts in that distribution.  Neurology/stroke MD input appreciated: Left MCA scattered several punctate infarcts, etiology unclear but possibly cardioembolic versus cryptogenic.  MRI brain showed approximately 10 punctate acute infarction scattered within the left MCA territory consistent with microembolic infarctions.  MRA head: Moderate to severe narrowing in the left supraclinoid ICA.  Decreased perfusion in the left distal MCA branches, without focal MCA stenosis or occlusion.  Multifocal narrowing of the right vertebral artery, which appears occluded just proximal to the vertebrobasilar junction.  CUS: Right ICA 40-59% stenosis, left ICA 1-39% stenosis. B/L LEV Dopplers: No DVT.  TTE: LVEF 96-78%, grade 1 diastolic dysfunction.  A1c  5.7.  LDL  73.  SLP and OT recommend CIR, consulted.  Per neurology, patient and family have agreed on a loop recorder and neurology is consulting EP cardiology for placement of same.  No antithrombotics PTA, now on DAPT with aspirin and Plavix.  As per EP cardiology input, not an appropriate candidate for loop recorder and recommend 30-day event monitor with outpatient follow-up pending course and recovery to further discuss possibility of loop monitoring in the future.  EP cardiology will arrange same.  Acute on chronic hypoxic respiratory failure: Due to right-sided CAP vs. aspiration pneumonia - Continue antibiotics x7 days per PCCM.  Completed IV ceftriaxone 12/15. - Continue aggressive pulmonary hygiene - Consider outpatient PFTs/pulmonary follow up. -Currently saturating in the high 90s on room air.  ESRD:  - Continue HD TTS per nephrology.  Seen at HD 12/15.  Nephrology continues to follow.  HTN:  - Continue coreg, nifedipine. Clonidine was added, will monitor BP trends, avoid hypotension.  Blood pressure is high, volume management across HD.   -Nephrology is increased Procardia from 30 to 60 mg daily.  Monitor.  T2DM: HbA1c 5.7 %. Would not attempt too rigid of control to avoid neuroglycopenia.  - SSI   AOCKD and iron deficiency anemia:  - Per nephrology. Continue monitoring. -Hemoglobin has dropped from 9.4-8.8 in the absence of overt bleeding.  May be a lab error.  Hemoglobin up to 10.1.  Gout: Chronic, quiescent.  - Continue allopurinol.  RLS:  - Continue ropinirole  HLD:  - Continue statin  Moderate protein-calorie malnutrition:  - MVM, protein supplementation  Constipation -Initiated bowel regimen pain  DVT prophylaxis: Heparin  Code Status: Full Family Communication: Discussed with daughter.  Disposition Plan:  Status is: Inpatient  Medically optimized for discharge.  As per CIR coordinator, awaiting determination from patient's insurance regarding CIR prior  authorization request.  Consultants:  PCCM Neurology  Procedures:  Echo  Antimicrobials: Ceftriaxone, azithromycin   Subjective: Patient reports that her anterior chest wall pain was much better, able to sleep well for the first time in a long time last night, was able to turn in bed without discomfort.  Noted that in addition to scheduled Tylenol, also used as needed oral Dilaudid.  Objective: Vitals:   07/12/21 0355 07/12/21 0834 07/12/21 1133 07/12/21 1313  BP: (!) 170/76 (!) 184/59 (!) 186/52 (!) 129/52  Pulse: 70 68 74 62  Resp: 18 16 16    Temp: 98.1 F (36.7 C) 98.2 F (36.8 C) 98.3 F (36.8 C)   TempSrc: Oral Oral Oral   SpO2: 100% 95% 100%   Weight:      Height:        Intake/Output Summary (Last 24 hours) at 07/12/2021 1434 Last data filed at 07/12/2021 1300 Gross per 24 hour  Intake 887.34 ml  Output 0 ml  Net 887.34 ml   Filed Weights   07/11/21 0500 07/11/21 0750 07/11/21 1212  Weight: 66 kg 63.2 kg 61.4 kg   General exam: Elderly female, moderately built and nourished lying comfortably propped up in bed undergoing HD via aVF in left upper arm.  Appeared in good spirits. Respiratory system: Clear to auscultation.  No increased work of breathing.  Reproducible bilateral lower anterior rib cage pain but less so. Cardiovascular system: S1 & S2 heard, RRR. No JVD, murmurs, rubs, gallops or clicks. No pedal edema.  Telemetry personally reviewed: Sinus rhythm. Gastrointestinal system: Abdomen is nondistended, soft and nontender.  Normal bowel sounds heard. Central nervous system: Alert  and oriented.?  Subtle diminished right nasolabial fold compared to left.  Did not appreciate any significant dysarthria or aphasia during my exam. Extremities: Symmetric 5 x 5 power. Skin: No rashes, lesions or ulcers Psychiatry: Judgement and insight appear normal. Mood & affect appropriate.    Data Reviewed: I have personally reviewed following labs and imaging  studies  CBC: Recent Labs  Lab 07/06/21 0026 07/07/21 0233 07/08/21 0041 07/09/21 0802 07/11/21 0303  WBC 10.3 9.8 9.5 6.0 7.7  HGB 9.4* 9.0* 9.4* 8.8* 10.1*  HCT 29.7* 29.6* 30.9* 28.1* 31.6*  MCV 91.7 93.1 93.6 93.0 91.1  PLT 130* 123* 154 176 631   Basic Metabolic Panel: Recent Labs  Lab 07/06/21 0026 07/07/21 0233 07/08/21 0041 07/09/21 0800 07/11/21 0840  NA 138 138 138 139 140  K 3.5 3.9 3.8 3.5 4.1  CL 103 101 101 104 104  CO2 23 27 25 26 27   GLUCOSE 106* 113* 90 95 95  BUN 28* 17 26* 32* 37*  CREATININE 5.85* 4.41* 6.02* 6.48* 6.45*  CALCIUM 8.6* 8.8* 8.9 8.7* 9.0  MG 2.1 2.0 2.2  --   --   PHOS 3.7  --   --  3.5 4.2   GFR: Estimated Creatinine Clearance: 6.1 mL/min (A) (by C-G formula based on SCr of 6.45 mg/dL (H)). Liver Function Tests: Recent Labs  Lab 07/09/21 0800 07/11/21 0840  AST  --  24  ALT  --  16  ALKPHOS  --  75  BILITOT  --  0.5  PROT  --  5.9*  ALBUMIN 2.8* 2.8*   2.8*    No results for input(s): AMMONIA in the last 168 hours.   HbA1C: Recent Labs    07/10/21 0418  HGBA1C 5.7*   CBG: Recent Labs  Lab 07/11/21 0604 07/11/21 1255 07/11/21 1644 07/11/21 2148 07/12/21 0611  GLUCAP 137* 105* 212* 125* 125*   Lipid Profile: Recent Labs    07/10/21 0418  CHOL 139  HDL 48  LDLCALC 73  TRIG 92  CHOLHDL 2.9   Urine analysis:    Component Value Date/Time   COLORURINE YELLOW 07/04/2021 1205   APPEARANCEUR HAZY (A) 07/04/2021 1205   LABSPEC 1.015 07/04/2021 1205   PHURINE 8.5 (H) 07/04/2021 1205   GLUCOSEU >=500 (A) 07/04/2021 1205   HGBUR SMALL (A) 07/04/2021 1205   BILIRUBINUR NEGATIVE 07/04/2021 1205   [place] 07/04/2021 1205   PROTEINUR 100 (A) 07/04/2021 1205   NITRITE NEGATIVE 07/04/2021 1205   LEUKOCYTESUR NEGATIVE 07/04/2021 1205   Recent Results (from the past 240 hour(s))  Resp Panel by RT-PCR (Flu A&B, Covid) Nasopharyngeal Swab     Status: None   Collection Time: 07/04/21  9:41 AM    Specimen: Nasopharyngeal Swab; Nasopharyngeal(NP) swabs in vial transport medium  Result Value Ref Range Status   SARS Coronavirus 2 by RT PCR NEGATIVE NEGATIVE Final    Comment: (NOTE) SARS-CoV-2 target nucleic acids are NOT DETECTED.  The SARS-CoV-2 RNA is generally detectable in upper respiratory specimens during the acute phase of infection. The lowest concentration of SARS-CoV-2 viral copies this assay can detect is 138 copies/mL. A negative result does not preclude SARS-Cov-2 infection and should not be used as the sole basis for treatment or other patient management decisions. A negative result may occur with  improper specimen collection/handling, submission of specimen other than nasopharyngeal swab, presence of viral mutation(s) within the areas targeted by this assay, and inadequate number of viral copies(<138 copies/mL). A negative result must  be combined with clinical observations, patient history, and epidemiological information. The expected result is Negative.  Fact Sheet for Patients:  EntrepreneurPulse.com.au  Fact Sheet for Healthcare Providers:  IncredibleEmployment.be  This test is no t yet approved or cleared by the [place] FDA and  has been authorized for detection and/or diagnosis of SARS-CoV-2 by FDA under an Emergency Use Authorization (EUA). This EUA will remain  in effect (meaning this test can be used) for the duration of the COVID-19 declaration under Section 564(b)(1) of the Act, 21 U.S.C.section 360bbb-3(b)(1), unless the authorization is terminated  or revoked sooner.       Influenza A by PCR NEGATIVE NEGATIVE Final   Influenza B by PCR NEGATIVE NEGATIVE Final    Comment: (NOTE) The Xpert Xpress SARS-CoV-2/FLU/RSV plus assay is intended as an aid in the diagnosis of influenza from Nasopharyngeal swab specimens and should not be used as a sole basis for treatment. Nasal washings and aspirates are  unacceptable for Xpert Xpress SARS-CoV-2/FLU/RSV testing.  Fact Sheet for Patients: EntrepreneurPulse.com.au  Fact Sheet for Healthcare Providers: IncredibleEmployment.be  This test is not yet approved or cleared by the [place] FDA and has been authorized for detection and/or diagnosis of SARS-CoV-2 by FDA under an Emergency Use Authorization (EUA). This EUA will remain in effect (meaning this test can be used) for the duration of the COVID-19 declaration under Section 564(b)(1) of the Act, 21 U.S.C. section 360bbb-3(b)(1), unless the authorization is terminated or revoked.  Performed at [facility], [place] [address], [place], [place] [address]   Culture, blood (routine x 2)     Status: None   Collection Time: 07/04/21 11:23 AM   Specimen: Right Antecubital; Blood  Result Value Ref Range Status   Specimen Description RIGHT ANTECUBITAL  Final   Special Requests BOTTLES DRAWN AEROBIC ONLY  Final   Culture   Final    NO GROWTH 5 DAYS Performed at [facility], [address] [place], [place] [address]    Report Status 07/09/2021 FINAL  Final  Culture, blood (routine x 2)     Status: None   Collection Time: 07/04/21 11:23 AM   Specimen: Right Antecubital; Blood  Result Value Ref Range Status   Specimen Description RIGHT ANTECUBITAL  Final   Special Requests ANAEROBIC  Final   Culture   Final    NO GROWTH 5 DAYS Performed at [facility], [place] [address], [place], [place] [address]    Report Status 07/09/2021 FINAL  Final  Respiratory (~20 pathogens) panel by PCR     Status: None   Collection Time: 07/04/21  1:15 PM   Specimen: Nasopharyngeal Swab; Respiratory  Result Value Ref Range Status   Adenovirus NOT DETECTED NOT DETECTED Final   Coronavirus 229E NOT DETECTED NOT DETECTED Final    Comment: (NOTE) The Coronavirus on the Respiratory Panel, DOES NOT test for the novel  Coronavirus (2019 nCoV)    Coronavirus  HKU1 NOT DETECTED NOT DETECTED Final   Coronavirus NL63 NOT DETECTED NOT DETECTED Final   Coronavirus OC43 NOT DETECTED NOT DETECTED Final   Metapneumovirus NOT DETECTED NOT DETECTED Final   Rhinovirus / Enterovirus NOT DETECTED NOT DETECTED Final   Influenza A NOT DETECTED NOT DETECTED Final   Influenza B NOT DETECTED NOT DETECTED Final   Parainfluenza Virus 1 NOT DETECTED NOT DETECTED Final   Parainfluenza Virus 2 NOT DETECTED NOT DETECTED Final   Parainfluenza Virus 3 NOT DETECTED NOT DETECTED Final  Parainfluenza Virus 4 NOT DETECTED NOT DETECTED Final   Respiratory Syncytial Virus NOT DETECTED NOT DETECTED Final   Bordetella pertussis NOT DETECTED NOT DETECTED Final   Bordetella Parapertussis NOT DETECTED NOT DETECTED Final   Chlamydophila pneumoniae NOT DETECTED NOT DETECTED Final   Mycoplasma pneumoniae NOT DETECTED NOT DETECTED Final    Comment: Performed at [facility], [place] [address] [place], [place] [address]  MRSA Next Gen by PCR, Nasal     Status: None   Collection Time: 07/04/21  4:32 PM   Specimen: Nasal Mucosa; Nasal Swab  Result Value Ref Range Status   MRSA by PCR Next Gen NOT DETECTED NOT DETECTED Final    Comment: (NOTE) The GeneXpert MRSA Assay (FDA approved for NASAL specimens only), is one component of a comprehensive MRSA colonization surveillance program. It is not intended to diagnose MRSA infection nor to guide or monitor treatment for MRSA infections. Test performance is not FDA approved in patients less than 47 years old. Performed at [facility], [place] [address] [place],  [address]       Radiology Studies: No results found.  Scheduled Meds:   stroke: mapping our early stages of recovery book   Does not apply Once   acetaminophen  1,000 mg Oral TID   allopurinol  100 mg Oral Daily   aspirin EC  325 mg Oral Daily   carvedilol  25 mg Oral 2 times per day on Sun Mon Wed Fri   Chlorhexidine Gluconate Cloth  6 each Topical Daily    cloNIDine  0.1 mg Oral BID   clopidogrel  75 mg Oral Daily   darbepoetin (ARANESP) injection - DIALYSIS  60 mcg Intravenous Q Thu-HD   docusate sodium  100 mg Oral BID   feeding supplement (NEPRO CARB STEADY)  237 mL Oral BID BM   heparin  5,000 Units Subcutaneous Q8H   insulin aspart  0-9 Units Subcutaneous TID WC   multivitamin  1 tablet Oral QHS   NIFEdipine  60 mg Oral QHS   pantoprazole  40 mg Oral QHS   polyethylene glycol  17 g Oral Daily   rOPINIRole  0.5 mg Oral QHS   senna  2 tablet Oral Daily   simvastatin  20 mg Oral QHS   Continuous Infusions:  sodium chloride Stopped (07/05/21 1832)     LOS: 8 days   Vernell Leep, MD,  FACP, [facility], [facility], [facility] (Care Management Physician Certified) [place]  To contact the attending provider between 7A-7P or the covering provider during after hours 7P-7A, please log into the web site [email or web address] and access using universal  password for that web site. If you do not have the password, please call the hospital operator.

## 2021-07-12 NOTE — Progress Notes (Signed)
Inpatient Rehab Admissions Coordinator:   Awaiting determination from [facility] Medicare regarding CIR prior auth request.  Will continue to follow.   Shann Medal, PT, DPT Admissions Coordinator [phone] 07/12/21  11:38 AM

## 2021-07-12 NOTE — Progress Notes (Signed)
Physical Therapy Treatment Patient Details Name: Stacey Dorsey MRN: [id number] DOB: Dec 21, 1932 Today's Date: 07/12/2021   History of Present Illness Pt is an 87 y/o female presenting 12/8 after cardiac arrest requiring 15 min CPR prior to ROSC. Intubated prior to arrival, extubated 12/9. On 12/11, pt with new onset word-finding, rapid response called and neurology consulted. 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 PMHx includes: ESRD on HD, poorly controlled HTN, DM, HLD, (presumed) COPD.    PT Comments    Pt received in supine, alert and agreeable to therapy session, with good participation and fair tolerance for transfer and short gait trial in room. Pt limited due to need to use toilet (unable to have BM for >7 days, RN aware she did not have BM) and reported dizziness. Pt with multiple LOB due to scissoring during gait trial and needed up to modA to recover, nursing staff may consider using BSC with her for safety or +2 to get to bathroom with gait belt low on hips due to poor use of RW and LOB. Pt continues to benefit from PT services to progress toward functional mobility goals. Plan to assess orthostatics next session, not able to elicit nystagmus during session although pt had difficulty following instructions for gaze/eye movements.  Recommendations for follow up therapy are one component of a multi-disciplinary discharge planning process, led by the attending physician.  Recommendations may be updated based on patient status, additional functional criteria and insurance authorization.  Follow Up Recommendations  Acute inpatient rehab (3hours/day)     Assistance Recommended at Discharge Frequent or constant Supervision/Assistance  Equipment Recommendations  BSC/3in1;Rolling walker (2 wheels)    Recommendations for  Other Services Rehab consult     Precautions / Restrictions Precautions Precautions: Fall Precaution Comments: monitor BP, rib fxs Restrictions Weight Bearing Restrictions: No     Mobility  Bed Mobility Overal bed mobility: Needs Assistance Bed Mobility: Supine to Sit;Sit to Supine   Sidelying to sit: Mod assist   Sit to supine: Min assist   General bed mobility comments: pt needs increased assist for trunk raise from flat HOB, using rail    Transfers Overall transfer level: Needs assistance Equipment used: Rolling walker (2 wheels) Transfers: Sit to/from Stand Sit to Stand: Min assist           General transfer comment: VC's for hand placement on seated surface for safety from EOB and toilet heights. Increased time and effort to power up to full stand, poor carryover of cues within session.    Ambulation/Gait Ambulation/Gait assistance: Min assist;Mod assist Gait Distance (Feet): 40 Feet (38ft to toilet, then 70ft) Assistive device: Rolling walker (2 wheels) Gait Pattern/deviations: Step-through pattern;Decreased stride length;Shuffle;Scissoring;Ataxic;Narrow base of support Gait velocity: decreased     General Gait Details: Pt drifting to L and R almost running into the walls or objects in the hall on both sides. Frequent scissoring with LLE and losses of balance requiring heavy mod assist to recover. Pt c/o dizziness and needed to toilet so distance limited this session, plan to assess full orthostatics next session.   Stairs             Wheelchair Mobility    Modified Rankin (Stroke Patients Only) Modified Rankin (Stroke Patients Only) Pre-Morbid Rankin Score: No symptoms Modified Rankin: Moderately severe disability     Balance Overall balance assessment: Needs assistance Sitting-balance  support: No upper extremity supported;Feet supported Sitting balance-Leahy Scale: Fair Sitting balance - Comments: cues for postural awareness initially, needs  UE support   Standing balance support: No upper extremity supported;During functional activity Standing balance-Leahy Scale: Poor Standing balance comment: Reliant on UE support with the RW and external assist with multiple LOB from scissoring            Cognition Arousal/Alertness: Awake/alert Behavior During Therapy: Anxious Overall Cognitive Status: Impaired/Different from baseline Area of Impairment: Attention;Following commands;Safety/judgement;Awareness;Problem solving;Memory       Current Attention Level: Sustained Memory: Decreased recall of precautions;Decreased short-term memory Following Commands: Follows one step commands inconsistently;Follows one step commands with increased time Safety/Judgement: Decreased awareness of safety;Decreased awareness of deficits Awareness: Emergent Problem Solving: Requires verbal cues;Requires tactile cues;Difficulty sequencing General Comments: Pt had trouble following commands when assessing for nystagmus (pt c/o dizziness), RN notified plan to assess orthostatics next time she gets up. Pt with some difficulty following commands (proximity to RW) but seems more due to poor attention than due to understanding. Pt anxious, asking PTA "am I going to die?" when she c/o dizziness. PTA encouraged pt to stay hydrated and let RN know if she is dizzy next time she gets up to walk           General Comments General comments (skin integrity, edema, etc.): VSS on RA      Pertinent Vitals/Pain Pain Assessment: Faces Faces Pain Scale: Hurts little more Pain Location: rib fxs on each side of chest under arms Pain Descriptors / Indicators: Sharp;Sore;Shooting Pain Intervention(s): Limited activity within patient's tolerance;Monitored during session;Repositioned (kept gait belt low around her hips)           PT Goals (current goals can now be found in the care plan section) Acute Rehab PT Goals Patient Stated Goal: to return home PT Goal  Formulation: With patient Time For Goal Achievement: 07/20/21 Progress towards PT goals: Progressing toward goals    Frequency    Min 4X/week      PT Plan Current plan remains appropriate;Frequency needs to be updated       AM-PAC PT "6 Clicks" Mobility   Outcome Measure  Help needed turning from your back to your side while in a flat bed without using bedrails?: A Little Help needed moving from lying on your back to sitting on the side of a flat bed without using bedrails?: A Lot Help needed moving to and from a bed to a chair (including a wheelchair)?: A Lot Help needed standing up from a chair using your arms (e.g., wheelchair or bedside chair)?: A Lot Help needed to walk in hospital room?: A Lot Help needed climbing 3-5 steps with a railing? : Total 6 Click Score: 12    End of Session Equipment Utilized During Treatment: Gait belt Activity Tolerance: Patient tolerated treatment well Patient left: in bed;with call bell/phone within reach;with bed alarm set;Other (comment) (bed in chair posture) Nurse Communication: Mobility status;Other (comment) (may need orthostatic assessment and new suppository, her other one came out when she toileted (did not have BM)) PT Visit Diagnosis: Unsteadiness on feet (R26.81);Difficulty in walking, not elsewhere classified (R26.2)     Time: 3762-8315 PT Time Calculation (min) (ACUTE ONLY): 26 min  Charges:  $Gait Training: 8-22 mins $Therapeutic Activity: 8-22 mins                     Sonny Poth P., PTA Acute Rehabilitation Services Pager: [phone] Office: [phone]    Rayme Bui  M Tahmid Stonehocker 07/12/2021, 5:18 PM

## 2021-07-12 NOTE — Progress Notes (Signed)
Occupational Therapy Treatment Patient Details Name: Stacey Dorsey MRN: [id number] DOB: [birth year] Today's Date: 07/12/2021   History of present illness Pt is an 87 y/o female presenting 12/8 after cardiac arrest requiring 15 min CPR prior to ROSC. Intubated prior to arrival, extubated 12/9. On 12/11, pt with new onset word-finding, rapid response called and neurology consulted. 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 PMHx includes: ESRD on HD, poorly controlled HTN, DM, HLD, (presumed) COPD.   OT comments  Patient continues to make progress towards goals in skilled OT session. Patient's session encompassed further assessment of vision in functional context. Patient with significant fatigue upon entry (sleeping upon arrival) often falling asleep mid-sentence and incoherent. RN informed, and present for the latter half of the session but significant improvement in orientation and appropriate response to prompts after sitting up and provided water. Patient continues to demonstrate inconsistencies in vision, with patient appropriately tracking gloved finger, but appeared to overshoot as if to anticipate where the therapist was going next. Patient also demonstrated no convergence when tested. Patient also provided 3 step task to assess vision when reading a magazine (visual scanning etc) where patient was able to complete 2/3 steps without prompting. Patient reporting in one instance that letters were "jumbled over top of each other" but in a different attempt able to see wording legibly. Therapy will continue to assess vision in a functional context. Patient still remains an excellent candidate for inpatient rehab; therapy to follow.    Recommendations for follow up therapy are one component of a multi-disciplinary discharge planning  process, led by the attending physician.  Recommendations may be updated based on patient status, additional functional criteria and insurance authorization.    Follow Up Recommendations  Acute inpatient rehab (3hours/day)    Assistance Recommended at Discharge Frequent or constant Supervision/Assistance  Equipment Recommendations  None recommended by OT    Recommendations for Other Services      Precautions / Restrictions Precautions Precautions: Fall Restrictions Weight Bearing Restrictions: No       Mobility Bed Mobility               General bed mobility comments: deferred due to just getting back in the bed    Transfers                         Balance                                           ADL either performed or assessed with clinical judgement   ADL Overall ADL's : Needs assistance/impaired Eating/Feeding: Set up                                          Extremity/Trunk Assessment              Vision   Vision Assessment?: Vision impaired- to be further tested in functional context Additional Comments: Able to follow gloved finger up down left and right, but appeared to be guessing more than following. No convergence demonstrated. At one point unable to read text on paper because the words were "overtop one another"  but then a few minutes later able to read a different page with same font   Perception     Praxis      Cognition Arousal/Alertness: Awake/alert Behavior During Therapy: WFL for tasks assessed/performed Overall Cognitive Status: Impaired/Different from baseline Area of Impairment: Attention;Following commands;Safety/judgement;Awareness;Problem solving;Memory                   Current Attention Level: Sustained Memory: Decreased recall of precautions;Decreased short-term memory Following Commands: Follows one step commands inconsistently;Follows one step commands with increased  time Safety/Judgement: Decreased awareness of safety;Decreased awareness of deficits Awareness: Emergent Problem Solving: Requires verbal cues;Requires tactile cues;Difficulty sequencing General Comments: Pt had trouble following commands for visual testing, started to play it off when assuming the therapist was given her a "bad report" in attempt to further assess vision in functional contexts, minimally tangential once more alert in session          Exercises     Shoulder Instructions       General Comments      Pertinent Vitals/ Pain       Pain Assessment: Faces Faces Pain Scale: Hurts little more Pain Location: "i just hurt all over today"  Home Living                                          Prior Functioning/Environment              Frequency  Min 2X/week        Progress Toward Goals  OT Goals(current goals can now be found in the care plan section)  Progress towards OT goals: Progressing toward goals  Acute Rehab OT Goals Patient Stated Goal: to get to rehab OT Goal Formulation: With patient Time For Goal Achievement: 07/19/21 Potential to Achieve Goals: Good  Plan Discharge plan remains appropriate    Co-evaluation                 AM-PAC OT "6 Clicks" Daily Activity     Outcome Measure   Help from another person eating meals?: A Little Help from another person taking care of personal grooming?: A Little Help from another person toileting, which includes using toliet, bedpan, or urinal?: A Little Help from another person bathing (including washing, rinsing, drying)?: A Lot Help from another person to put on and taking off regular upper body clothing?: A Little Help from another person to put on and taking off regular lower body clothing?: A Lot 6 Click Score: 16    End of Session    OT Visit Diagnosis: Unsteadiness on feet (R26.81);Pain;Muscle weakness (generalized) (M62.81);Other symptoms and signs involving cognitive  function;Low vision, both eyes (H54.2)   Activity Tolerance Patient limited by fatigue   Patient Left in bed;with call bell/phone within reach;with bed alarm set   Nurse Communication Mobility status;Other (comment) (increased lethargy at beginning of session)        Time: 1326-1350 OT Time Calculation (min): 24 min  Charges: OT General Charges $OT Visit: 1 Visit OT Treatments $Self Care/Home Management : 23-37 mins  Corinne Ports E. Norena Bratton, COTA/L Acute Rehabilitation Services [place] 07/12/2021, 2:54 PM

## 2021-07-12 NOTE — Progress Notes (Signed)
[facility] Progress Note   Subjective: Seen in room, no new c/o's. Still having chest pain off and on maybe, hard to communicate due to aphasia.   Objective Vitals:   07/12/21 0046 07/12/21 0355 07/12/21 0834 07/12/21 1133  BP: (!) 150/60 (!) 170/76 (!) 184/59 (!) 186/52  Pulse: 65 70 68 74  Resp: 18 18 16 16   Temp: 98.3 F (36.8 C) 98.1 F (36.7 C) 98.2 F (36.8 C) 98.3 F (36.8 C)  TempSrc: Oral Oral Oral Oral  SpO2:  100% 95% 100%  Weight:      Height:         Physical Exam General: Elderly woman, no distress, pleasant Heart: RRR. No m,r,g  Lungs: Scattered wheeze  Abdomen: Soft non-tender  Extremities: No sig LE edema  Dialysis Access: LUE AVG +bruit     Home meds - coreg 12.5 bid, procardia xl 30 hs, zocor, requip, prilosec, MVI, advil, auryxia 1 tid ac, zyloprim 100, prns/ vits/ supps    Dialysis Orders:  [facility]  3.5h   64.5kg  2/2 bath  400/500   LUA AVG  Hep none  Sensipar 30 ug tiw po Hectorol 5 ug tiw IV Mircera 75 ug q 2wks , last 12/01  Venofer 50 mg q. Weekly    Assessment/Plan: ESRD - TTS HD. HD today on schedule.  Acute CVA - L MCA by CT and MRI, +aphasia, on DAPT. Microembolic infarctions. EP consulted for loop recorder PEA arrest - Out of hospital, per primary  Rib fractures - due to CPR Pneumonia/ resp failure- improved, getting IV ABX HTN - BP's high. Coreg/ procardia as at home, clonidine added. 2-3kg under dry. Will ^procardia to 60 qd.  Anemia-  Hb 9- 10. Aranesp 60 given on 12/15.  CKD-BMD- Ca ok, P ok     Additional Objective Labs: Basic Metabolic Panel: Recent Labs  Lab 07/06/21 0026 07/07/21 0233 07/08/21 0041 07/09/21 0800 07/11/21 0840  NA 138   < > 138 139 140  K 3.5   < > 3.8 3.5 4.1  CL 103   < > 101 104 104  CO2 23   < > 25 26 27   GLUCOSE 106*   < > 90 95 95  BUN 28*   < > 26* 32* 37*  CREATININE 5.85*   < > 6.02* 6.48* 6.45*  CALCIUM 8.6*   < > 8.9 8.7* 9.0  PHOS 3.7  --   --  3.5  4.2   < > = values in this interval not displayed.    CBC: Recent Labs  Lab 07/06/21 0026 07/07/21 0233 07/08/21 0041 07/09/21 0802 07/11/21 0303  WBC 10.3 9.8 9.5 6.0 7.7  HGB 9.4* 9.0* 9.4* 8.8* 10.1*  HCT 29.7* 29.6* 30.9* 28.1* 31.6*  MCV 91.7 93.1 93.6 93.0 91.1  PLT 130* 123* 154 176 192    Blood Culture    Component Value Date/Time   SDES RIGHT ANTECUBITAL 07/04/2021 1123   SDES RIGHT ANTECUBITAL 07/04/2021 1123   SPECREQUEST BOTTLES DRAWN AEROBIC ONLY 07/04/2021 1123   SPECREQUEST ANAEROBIC 07/04/2021 1123   CULT  07/04/2021 1123    NO GROWTH 5 DAYS Performed at [facility], [place] [address], [place], [place] [address]    CULT  07/04/2021 1123    NO GROWTH 5 DAYS Performed at [facility], [place] [address], [place], [place] [address]    REPTSTATUS 07/09/2021 FINAL 07/04/2021 1123   REPTSTATUS 07/09/2021 FINAL 07/04/2021 1123  Medications:  sodium chloride Stopped (07/05/21 1832)   lactated ringers Stopped (07/04/21 1324)     stroke: mapping our early stages of recovery book   Does not apply Once   acetaminophen  1,000 mg Oral TID   allopurinol  100 mg Oral Daily   aspirin EC  325 mg Oral Daily   carvedilol  25 mg Oral 2 times per day on Sun Mon Wed Fri   Chlorhexidine Gluconate Cloth  6 each Topical Daily   cloNIDine  0.1 mg Oral BID   clopidogrel  75 mg Oral Daily   darbepoetin (ARANESP) injection - DIALYSIS  60 mcg Intravenous Q Thu-HD   docusate sodium  100 mg Oral BID   feeding supplement (NEPRO CARB STEADY)  237 mL Oral BID BM   heparin  5,000 Units Subcutaneous Q8H   insulin aspart  0-9 Units Subcutaneous TID WC   multivitamin  1 tablet Oral QHS   NIFEdipine  30 mg Oral QHS   pantoprazole  40 mg Oral QHS   polyethylene glycol  17 g Oral Daily   rOPINIRole  0.5 mg Oral QHS   senna  2 tablet Oral Daily   simvastatin  20 mg Oral QHS

## 2021-07-12 NOTE — Care Management Important Message (Signed)
Important Message  Patient Details  Name: Ketina Vangorden MRN: [id number] Date of Birth: [birth year]   Medicare Important Message Given:  Yes     Shelda Altes 07/12/2021, 12:12 PM

## 2021-07-13 LAB — GLUCOSE, CAPILLARY
Glucose-Capillary: 111 mg/dL — ABNORMAL HIGH (ref 70–99)
Glucose-Capillary: 121 mg/dL — ABNORMAL HIGH (ref 70–99)
Glucose-Capillary: 133 mg/dL — ABNORMAL HIGH (ref 70–99)

## 2021-07-13 NOTE — Progress Notes (Signed)
[facility] Progress Note   Subjective: Seen on HD. No c/o's.   Objective Vitals:   07/13/21 1200 07/13/21 1227 07/13/21 1235 07/13/21 1543  BP: (!) 107/41 (!) 140/43 (!) 154/53 (!) 121/52  Pulse: 78 74 73 69  Resp:  14    Temp:  98.9 F (37.2 C)    TempSrc:  Oral    SpO2: 99% 100% 100% 100%  Weight:      Height:         Physical Exam General: Elderly woman, no distress, pleasant Heart: RRR. No m,r,g  Lungs: Scattered wheeze  Abdomen: Soft non-tender  Extremities: No sig LE edema  Dialysis Access: LUE AVG +bruit     Home meds - coreg 12.5 bid, procardia xl 30 hs, zocor, requip, prilosec, MVI, advil, auryxia 1 tid ac, zyloprim 100, prns/ vits/ supps    Dialysis Orders:  [facility]  3.5h   64.5kg  2/2 bath  400/500   LUA AVG  Hep none  Sensipar 30 ug tiw po Hectorol 5 ug tiw IV Mircera 75 ug q 2wks , last 12/01  Venofer 50 mg q. Weekly    Assessment/Plan: ESRD - TTS HD. HD today on schedule.  Acute CVA - L MCA by CT and MRI, +aphasia, on DAPT. Microembolic infarctions. EP consulted for loop recorder.  PEA arrest - Out of hospital, per primary  Rib fractures - due to CPR Pneumonia- improved, sp 5-7 days of IV ABX HTN/vol - BP's high. Coreg/ procardia as at home, clonidine added. 2-3kg under dry. Increased procardia to 60 qd. 2 kg under dry wt. Unable to pull fluid today.  Anemia-  Hb 9- 10. Weekly Aranesp 60ug on thursdays, last 12/15.  CKD-BMD- Ca ok, P ok     Additional Objective Labs: Basic Metabolic Panel: Recent Labs  Lab 07/08/21 0041 07/09/21 0800 07/11/21 0840  NA 138 139 140  K 3.8 3.5 4.1  CL 101 104 104  CO2 25 26 27   GLUCOSE 90 95 95  BUN 26* 32* 37*  CREATININE 6.02* 6.48* 6.45*  CALCIUM 8.9 8.7* 9.0  PHOS  --  3.5 4.2    CBC: Recent Labs  Lab 07/07/21 0233 07/08/21 0041 07/09/21 0802 07/11/21 0303  WBC 9.8 9.5 6.0 7.7  HGB 9.0* 9.4* 8.8* 10.1*  HCT 29.6* 30.9* 28.1* 31.6*  MCV 93.1 93.6 93.0 91.1  PLT  123* 154 176 192    Blood Culture    Component Value Date/Time   SDES RIGHT ANTECUBITAL 07/04/2021 1123   SDES RIGHT ANTECUBITAL 07/04/2021 1123   SPECREQUEST BOTTLES DRAWN AEROBIC ONLY 07/04/2021 1123   SPECREQUEST ANAEROBIC 07/04/2021 1123   CULT  07/04/2021 1123    NO GROWTH 5 DAYS Performed at [facility], [place] [address], [place], [place] [address]    CULT  07/04/2021 1123    NO GROWTH 5 DAYS Performed at [facility], [place] [address], [place],  [address]    REPTSTATUS 07/09/2021 FINAL 07/04/2021 1123   REPTSTATUS 07/09/2021 FINAL 07/04/2021 1123      Medications:  sodium chloride Stopped (07/05/21 1832)     stroke: mapping our early stages of recovery book   Does not apply Once   acetaminophen  1,000 mg Oral TID   allopurinol  100 mg Oral Daily   aspirin EC  325 mg Oral Daily   carvedilol  25 mg Oral 2 times per day on Sun Mon Wed Fri   Chlorhexidine Gluconate Cloth  6 each Topical Daily   cloNIDine  0.1 mg Oral BID   clopidogrel  75 mg Oral Daily   darbepoetin (ARANESP) injection - DIALYSIS  60 mcg Intravenous Q Thu-HD   docusate sodium  100 mg Oral BID   feeding supplement (NEPRO CARB STEADY)  237 mL Oral BID BM   heparin  5,000 Units Subcutaneous Q8H   insulin aspart  0-9 Units Subcutaneous TID WC   multivitamin  1 tablet Oral QHS   NIFEdipine  60 mg Oral QHS   pantoprazole  40 mg Oral QHS   polyethylene glycol  17 g Oral Daily   rOPINIRole  0.5 mg Oral QHS   senna  2 tablet Oral Daily   simvastatin  20 mg Oral QHS

## 2021-07-13 NOTE — Progress Notes (Signed)
PROGRESS NOTE  Stacey Dorsey  ONG:[id number] DOB: 01-Feb-1933 DOA: 07/04/2021 PCP: Mindi Curling, PA-C   Brief Narrative: Stacey Dorsey is an 87 yo F hx ESRD on HD, poorly controlled HTN, T2DM, HLD, (presumed) COPD on chronic supplemental oxygen who presented to [facility] 12/8 following cardiac arrest. Patient was in usual state of health until 12/8 morning when she felt short of breath while getting ready to go to dialysis. EMS was called, on EMS arrival pt was altered and subsequently sustained witnessed cardiac arrest -- PEA. Received 15 min CPR, 1 epi, 1g calcium, 50mg  bicarb, as well as 167mcg fent and  5mg  versed. King placed in field which was exchanged for ETT in ED. CXR revealed right-sided airspace disease. Opacities confirmed on CTA chest which showed no PE, suggested volume overload, and multiple bilateral rib fractures. CT head showed only encephalomalacia from old anterior MCA-territory stroke. Started on rocephin and azithromycin for pneumonia. Echocardiogram revealed LVEF 50-55%, G1DD, normal RV size and function, severely elevated PASP with estimated RVSP of 61.21mmHg. Volume status has been regulated by hemodialysis. Ultimately was extubated 12/9 and transferred out of ICU 12/11. CT head on that date due to memory difficulties was stable from admission. Due to persistence of symptoms, MRI brain was performed showing multiple infarcts suspected to be due to microemboli during resuscitation. Neurology is consulted and further work up is ordered.  Assessment & Plan: Principal Problem:   Cardiac arrest [facility]) Active Problems:   Malnutrition of moderate degree   Cerebral embolism with cerebral infarction   OOH PEA cardiac arrest: ROSC after 15 minutes. With asymmetric infiltrates, preceding dyspnea and preserved LVEF, pneumonia/hypoxia is presumptive trigger.  -Remains in sinus rhythm on monitor. -CTA chest 12/8: No PE. -2D echo 12/8: LVEF 50-55%.  Rib fractures with musculoskeletal chest  pain:  -CTA chest 12/8 showed acute fractures of the right fourth and sixth through eighth ribs and left sixth through ninth ribs. - Continue pain medication: Changed oxycodone prn to hydromorphone prn due to ESRD.  Still reports bilateral rib cage pain, especially on deep breathing changing positions.  Changed acetaminophen to 1 g 3 times daily.  If no improvement, could consider lidocaine patches. - Pulmonary hygiene.  -Patient reports that her pain is now better controlled on this multimodality treatment.  Encouraged incentive spirometry, discussed with RN to provide same. -As per nephrology, patient had transient chest pain at HD 12/16, EKG without acute changes.  Suspect this is likely musculoskeletal.  However to be sure, checked high-sensitivity troponin, 51 > 45, not consistent with ACS. -Ongoing chest wall pain but better compared to 2 to 3 days ago.  Acute metabolic encephalopathy, acute stroke: Suspected to be delirium as well as expressive aphasia due to stroke. EEG 12/12 was suggestive of mild, nonspecific, diffuse encephalopathy without seizures or epileptiform discharges.  - CT head suggested anterior left MCA stroke/encephalomalacia further defined by MRI as multiple microembolic infarcts in that distribution.  Neurology/stroke MD input appreciated: Left MCA scattered several punctate infarcts, etiology unclear but possibly cardioembolic versus cryptogenic.  MRI brain showed approximately 10 punctate acute infarction scattered within the left MCA territory consistent with microembolic infarctions.  MRA head: Moderate to severe narrowing in the left supraclinoid ICA.  Decreased perfusion in the left distal MCA branches, without focal MCA stenosis or occlusion.  Multifocal narrowing of the right vertebral artery, which appears occluded just proximal to the vertebrobasilar junction.  CUS: Right ICA 40-59% stenosis, left ICA 1-39% stenosis. B/L LEV Dopplers: No DVT.  TTE:  LVEF 50-55%, grade 1  diastolic dysfunction.  A1c 5.7.  LDL 73.  SLP and OT recommend CIR, consulted.  Per neurology, patient and family have agreed on a loop recorder and neurology is consulting EP cardiology for placement of same.  No antithrombotics PTA, now on DAPT with aspirin and Plavix.  As per EP cardiology input, not an appropriate candidate for loop recorder and recommend 30-day event monitor with outpatient follow-up pending course and recovery to further discuss possibility of loop monitoring in the future.  EP cardiology will arrange same.  Acute on chronic hypoxic respiratory failure: Due to right-sided CAP vs. aspiration pneumonia - Continue antibiotics x7 days per PCCM.  Completed IV ceftriaxone 12/15. - Continue aggressive pulmonary hygiene - Consider outpatient PFTs/pulmonary follow up. -Currently saturating in the high 90s on room air.  ESRD:  - Continue HD TTS per nephrology.  Seen at HD 12/17.  Nephrology continues to follow.  HTN:  - Continue coreg, nifedipine. Clonidine was added, will monitor BP trends, avoid hypotension.  Blood pressure is high, volume management across HD.   -Nephrology is increased Procardia from 30 to 60 mg daily.  Monitor.  T2DM: HbA1c 5.7 %. Would not attempt too rigid of control to avoid neuroglycopenia.  - SSI   AOCKD and iron deficiency anemia:  - Per nephrology. Continue monitoring. -Hemoglobin has dropped from 9.4-8.8 in the absence of overt bleeding.  May be a lab error.  Hemoglobin up to 10.1.  Gout: Chronic, quiescent.  - Continue allopurinol.  RLS:  - Continue ropinirole  HLD:  - Continue statin  Moderate protein-calorie malnutrition:  - MVM, protein supplementation  Constipation -Initiated bowel regimen pain  DVT prophylaxis: Heparin  Code Status: Full Family Communication: None at bedside. Disposition Plan:  Status is: Inpatient  Medically optimized for discharge.  As per CIR coordinator, awaiting determination from patient's insurance  regarding CIR prior authorization request.  Consultants:  PCCM Neurology  Procedures:  Echo  Antimicrobials: Ceftriaxone, azithromycin   Subjective: Seen at HD today.  Reports that her chest pain is overall better compared to 2 to 3 days ago but still has it mostly when she turns.  States that she has start using the incentive spirometer.  Objective: Vitals:   07/13/21 0930 07/13/21 1000 07/13/21 1030 07/13/21 1100  BP: 127/61 129/85 (!) 146/60 (!) 113/51  Pulse: 66 70 69 69  Resp:      Temp:      TempSrc:      SpO2: 98%     Weight:      Height:        Intake/Output Summary (Last 24 hours) at 07/13/2021 1110 Last data filed at 07/12/2021 1300 Gross per 24 hour  Intake 120 ml  Output 0 ml  Net 120 ml   Filed Weights   07/11/21 0750 07/11/21 1212 07/13/21 0852  Weight: 63.2 kg 61.4 kg 62.8 kg   General exam: Elderly female, moderately built and nourished lying comfortably propped up in bed undergoing HD via aVF in left upper arm.  Appeared in good spirits. Respiratory system: Reproducible bilateral chest wall tenderness.  Clear to auscultation.  No increased work of breathing. Cardiovascular system: S1 & S2 heard, RRR. No JVD, murmurs, rubs, gallops or clicks. No pedal edema.   Gastrointestinal system: Abdomen is nondistended, soft and nontender.  Normal bowel sounds heard. Central nervous system: Alert and oriented.?  Subtle diminished right nasolabial fold compared to left.  Did not appreciate any significant dysarthria or aphasia during my  exam. Extremities: Symmetric 5 x 5 power. Skin: No rashes, lesions or ulcers Psychiatry: Judgement and insight appear normal. Mood & affect appropriate.    Data Reviewed: I have personally reviewed following labs and imaging studies  CBC: Recent Labs  Lab 07/07/21 0233 07/08/21 0041 07/09/21 0802 07/11/21 0303  WBC 9.8 9.5 6.0 7.7  HGB 9.0* 9.4* 8.8* 10.1*  HCT 29.6* 30.9* 28.1* 31.6*  MCV 93.1 93.6 93.0 91.1  PLT 123*  154 176 657   Basic Metabolic Panel: Recent Labs  Lab 07/07/21 0233 07/08/21 0041 07/09/21 0800 07/11/21 0840  NA 138 138 139 140  K 3.9 3.8 3.5 4.1  CL 101 101 104 104  CO2 27 25 26 27   GLUCOSE 113* 90 95 95  BUN 17 26* 32* 37*  CREATININE 4.41* 6.02* 6.48* 6.45*  CALCIUM 8.8* 8.9 8.7* 9.0  MG 2.0 2.2  --   --   PHOS  --   --  3.5 4.2   GFR: Estimated Creatinine Clearance: 6.2 mL/min (A) (by C-G formula based on SCr of 6.45 mg/dL (H)). Liver Function Tests: Recent Labs  Lab 07/09/21 0800 07/11/21 0840  AST  --  24  ALT  --  16  ALKPHOS  --  75  BILITOT  --  0.5  PROT  --  5.9*  ALBUMIN 2.8* 2.8*   2.8*    No results for input(s): AMMONIA in the last 168 hours.   HbA1C: No results for input(s): HGBA1C in the last 72 hours.  CBG: Recent Labs  Lab 07/12/21 0611 07/12/21 1138 07/12/21 1650 07/12/21 2120 07/13/21 0811  GLUCAP 125* 161* 106* 190* 111*   Lipid Profile: No results for input(s): CHOL, HDL, LDLCALC, TRIG, CHOLHDL, LDLDIRECT in the last 72 hours.  Urine analysis:    Component Value Date/Time   COLORURINE YELLOW 07/04/2021 1205   APPEARANCEUR HAZY (A) 07/04/2021 1205   LABSPEC 1.015 07/04/2021 1205   PHURINE 8.5 (H) 07/04/2021 1205   GLUCOSEU >=500 (A) 07/04/2021 1205   HGBUR SMALL (A) 07/04/2021 1205   BILIRUBINUR NEGATIVE 07/04/2021 1205   [place] 07/04/2021 1205   PROTEINUR 100 (A) 07/04/2021 1205   NITRITE NEGATIVE 07/04/2021 1205   LEUKOCYTESUR NEGATIVE 07/04/2021 1205   Recent Results (from the past 240 hour(s))  Resp Panel by RT-PCR (Flu A&B, Covid) Nasopharyngeal Swab     Status: None   Collection Time: 07/04/21  9:41 AM   Specimen: Nasopharyngeal Swab; Nasopharyngeal(NP) swabs in vial transport medium  Result Value Ref Range Status   SARS Coronavirus 2 by RT PCR NEGATIVE NEGATIVE Final    Comment: (NOTE) SARS-CoV-2 target nucleic acids are NOT DETECTED.  The SARS-CoV-2 RNA is generally detectable in upper  respiratory specimens during the acute phase of infection. The lowest concentration of SARS-CoV-2 viral copies this assay can detect is 138 copies/mL. A negative result does not preclude SARS-Cov-2 infection and should not be used as the sole basis for treatment or other patient management decisions. A negative result may occur with  improper specimen collection/handling, submission of specimen other than nasopharyngeal swab, presence of viral mutation(s) within the areas targeted by this assay, and inadequate number of viral copies(<138 copies/mL). A negative result must be combined with clinical observations, patient history, and epidemiological information. The expected result is Negative.  Fact Sheet for Patients:  EntrepreneurPulse.com.au  Fact Sheet for Healthcare Providers:  IncredibleEmployment.be  This test is no t yet approved or cleared by the [place] and  has been authorized  for detection and/or diagnosis of SARS-CoV-2 by FDA under an Emergency Use Authorization (EUA). This EUA will remain  in effect (meaning this test can be used) for the duration of the COVID-19 declaration under Section 564(b)(1) of the Act, 21 U.S.C.section 360bbb-3(b)(1), unless the authorization is terminated  or revoked sooner.       Influenza A by PCR NEGATIVE NEGATIVE Final   Influenza B by PCR NEGATIVE NEGATIVE Final    Comment: (NOTE) The Xpert Xpress SARS-CoV-2/FLU/RSV plus assay is intended as an aid in the diagnosis of influenza from Nasopharyngeal swab specimens and should not be used as a sole basis for treatment. Nasal washings and aspirates are unacceptable for Xpert Xpress SARS-CoV-2/FLU/RSV testing.  Fact Sheet for Patients: EntrepreneurPulse.com.au  Fact Sheet for Healthcare Providers: IncredibleEmployment.be  This test is not yet approved or cleared by the [place] FDA and has been  authorized for detection and/or diagnosis of SARS-CoV-2 by FDA under an Emergency Use Authorization (EUA). This EUA will remain in effect (meaning this test can be used) for the duration of the COVID-19 declaration under Section 564(b)(1) of the Act, 21 U.S.C. section 360bbb-3(b)(1), unless the authorization is terminated or revoked.  Performed at [facility], [place] [address], [place], [place] [address]   Culture, blood (routine x 2)     Status: None   Collection Time: 07/04/21 11:23 AM   Specimen: Right Antecubital; Blood  Result Value Ref Range Status   Specimen Description RIGHT ANTECUBITAL  Final   Special Requests BOTTLES DRAWN AEROBIC ONLY  Final   Culture   Final    NO GROWTH 5 DAYS Performed at [facility], [address], [place], [place] [address]    Report Status 07/09/2021 FINAL  Final  Culture, blood (routine x 2)     Status: None   Collection Time: 07/04/21 11:23 AM   Specimen: Right Antecubital; Blood  Result Value Ref Range Status   Specimen Description RIGHT ANTECUBITAL  Final   Special Requests ANAEROBIC  Final   Culture   Final    NO GROWTH 5 DAYS Performed at [facility], [place] [address] [place], [place] [address]    Report Status 07/09/2021 FINAL  Final  Respiratory (~20 pathogens) panel by PCR     Status: None   Collection Time: 07/04/21  1:15 PM   Specimen: Nasopharyngeal Swab; Respiratory  Result Value Ref Range Status   Adenovirus NOT DETECTED NOT DETECTED Final   Coronavirus 229E NOT DETECTED NOT DETECTED Final    Comment: (NOTE) The Coronavirus on the Respiratory Panel, DOES NOT test for the novel  Coronavirus (2019 nCoV)    Coronavirus HKU1 NOT DETECTED NOT DETECTED Final   Coronavirus NL63 NOT DETECTED NOT DETECTED Final   Coronavirus OC43 NOT DETECTED NOT DETECTED Final   Metapneumovirus NOT DETECTED NOT DETECTED Final   Rhinovirus / Enterovirus NOT DETECTED NOT DETECTED Final   Influenza A NOT DETECTED NOT DETECTED  Final   Influenza B NOT DETECTED NOT DETECTED Final   Parainfluenza Virus 1 NOT DETECTED NOT DETECTED Final   Parainfluenza Virus 2 NOT DETECTED NOT DETECTED Final   Parainfluenza Virus 3 NOT DETECTED NOT DETECTED Final   Parainfluenza Virus 4 NOT DETECTED NOT DETECTED Final   Respiratory Syncytial Virus NOT DETECTED NOT DETECTED Final   Bordetella pertussis NOT DETECTED NOT DETECTED Final   Bordetella Parapertussis NOT DETECTED NOT DETECTED Final   Chlamydophila pneumoniae NOT DETECTED NOT DETECTED Final   Mycoplasma pneumoniae  NOT DETECTED NOT DETECTED Final    Comment: Performed at [facility], [place] [address] [place], [place] [address]  MRSA Next Gen by PCR, Nasal     Status: None   Collection Time: 07/04/21  4:32 PM   Specimen: Nasal Mucosa; Nasal Swab  Result Value Ref Range Status   MRSA by PCR Next Gen NOT DETECTED NOT DETECTED Final    Comment: (NOTE) The GeneXpert MRSA Assay (FDA approved for NASAL specimens only), is one component of a comprehensive MRSA colonization surveillance program. It is not intended to diagnose MRSA infection nor to guide or monitor treatment for MRSA infections. Test performance is not FDA approved in patients less than 26 years old. Performed at [facility], [place] [address] [place], [place] [address]       Radiology Studies: No results found.  Scheduled Meds:   stroke: mapping our early stages of recovery book   Does not apply Once   acetaminophen  1,000 mg Oral TID   allopurinol  100 mg Oral Daily   aspirin EC  325 mg Oral Daily   carvedilol  25 mg Oral 2 times per day on Sun Mon Wed Fri   Chlorhexidine Gluconate Cloth  6 each Topical Daily   cloNIDine  0.1 mg Oral BID   clopidogrel  75 mg Oral Daily   darbepoetin (ARANESP) injection - DIALYSIS  60 mcg Intravenous Q Thu-HD   docusate sodium  100 mg Oral BID   feeding supplement (NEPRO CARB STEADY)  237 mL Oral BID BM   heparin  5,000 Units Subcutaneous Q8H   insulin  aspart  0-9 Units Subcutaneous TID WC   multivitamin  1 tablet Oral QHS   NIFEdipine  60 mg Oral QHS   pantoprazole  40 mg Oral QHS   polyethylene glycol  17 g Oral Daily   rOPINIRole  0.5 mg Oral QHS   senna  2 tablet Oral Daily   simvastatin  20 mg Oral QHS   Continuous Infusions:  sodium chloride Stopped (07/05/21 1832)     LOS: 9 days   Vernell Leep, MD,  FACP, [facility], [facility], [facility] (Care Management Physician Certified) [place]  To contact the attending provider between 7A-7P or the covering provider during after hours 7P-7A, please log into the web site [email or web address] and access using universal [place] password for that web site. If you do not have the password, please call the hospital operator.

## 2021-07-14 LAB — GLUCOSE, CAPILLARY
Glucose-Capillary: 113 mg/dL — ABNORMAL HIGH (ref 70–99)
Glucose-Capillary: 266 mg/dL — ABNORMAL HIGH (ref 70–99)
Glucose-Capillary: 67 mg/dL — ABNORMAL LOW (ref 70–99)
Glucose-Capillary: 143 mg/dL — ABNORMAL HIGH (ref 70–99)
Glucose-Capillary: 186 mg/dL — ABNORMAL HIGH (ref 70–99)

## 2021-07-14 MED ORDER — WHITE PETROLATUM EX OINT
TOPICAL_OINTMENT | CUTANEOUS | Status: AC
  Filled 2021-07-14: qty 28.35

## 2021-07-14 NOTE — Progress Notes (Signed)
Occupational Therapy Treatment Patient Details Name: Delta Ledo MRN: [id number] DOB: [birth year] Today's Date: 07/14/2021   History of present illness Pt is an 87 y/o female presenting 12/8 after cardiac arrest requiring 15 min CPR prior to ROSC. Intubated prior to arrival, extubated 12/9. On 12/11, pt with new onset word-finding, rapid response called and neurology consulted. 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 PMHx includes: ESRD on HD, poorly controlled HTN, DM, HLD, (presumed) COPD.   OT comments  Pt is making functional progress towards her goals this session. She reported 2/10 pain at the start of the session with bed mobility which required min A. She was able to complete peri care in standing given min guard, and ambulation within the room with min guard. She does benefit from verbal cues throughout for safety, and needs increased cues for multi-step commands. Encouraged pt to ambulate to/drom the bathroom with assistance from staff during the day while in acute care. She continues to benefit from OT. D/c recommendation remains appropriate.    Recommendations for follow up therapy are one component of a multi-disciplinary discharge planning process, led by the attending physician.  Recommendations may be updated based on patient status, additional functional criteria and insurance authorization.    Follow Up Recommendations  Acute inpatient rehab (3hours/day)    Assistance Recommended at Discharge Frequent or constant Supervision/Assistance  Equipment Recommendations  None recommended by OT    Recommendations for Other Services      Precautions / Restrictions Precautions Precautions: Fall Precaution Comments: monitor BP, rib fxs Restrictions Weight Bearing Restrictions: No       Mobility Bed  Mobility Overal bed mobility: Needs Assistance Bed Mobility: Supine to Sit     Supine to sit: Min assist     General bed mobility comments: pt hugging heart pillow, min A for trunk elevation    Transfers Overall transfer level: Needs assistance Equipment used: Rolling walker (2 wheels) Transfers: Sit to/from Stand Sit to Stand: Min guard           General transfer comment: demonstrated good hand placement, 1 vc for RW management     Balance Overall balance assessment: Needs assistance Sitting-balance support: No upper extremity supported;Feet supported Sitting balance-Leahy Scale: Fair     Standing balance support: No upper extremity supported;During functional activity Standing balance-Leahy Scale: Fair Standing balance comment: able to wash peri area in standing without UE support                           ADL either performed or assessed with clinical judgement   ADL Overall ADL's : Needs assistance/impaired     Grooming: Min guard;Standing                   Toilet Transfer: Min guard;Ambulation;Rolling walker (2 wheels)   Toileting- Clothing Manipulation and Hygiene: Min guard;Sit to/from stand Toileting - Clothing Manipulation Details (indicate cue type and reason): in standing     Functional mobility during ADLs: Min guard;Rolling walker (2 wheels) General ADL Comments: pt expressed appreciation for therapy, ambulation, and bathing. Pt demonstrated functional imrpovements this session. Benefits from verbal cues for safety and sequencing    Extremity/Trunk Assessment Upper Extremity Assessment LUE Deficits / Details: dialysis side, patient with decreased strength and AROM as well as decreased coordination LUE Sensation: decreased light touch  LUE Coordination: decreased fine motor   Lower Extremity Assessment Lower Extremity Assessment: Defer to PT evaluation        Vision   Vision Assessment?: Vision impaired- to be further tested  in functional context   Perception Perception Perception: Not tested   Praxis      Cognition Arousal/Alertness: Awake/alert Behavior During Therapy: Anxious Overall Cognitive Status: Impaired/Different from baseline Area of Impairment: Attention;Following commands;Safety/judgement;Awareness;Problem solving                   Current Attention Level: Sustained Memory: Decreased recall of precautions Following Commands: Follows one step commands with increased time Safety/Judgement: Decreased awareness of safety;Decreased awareness of deficits Awareness: Emergent Problem Solving: Requires verbal cues;Slow processing General Comments: pt not following all commands this session, required incrased cues for safety.          Exercises     Shoulder Instructions       General Comments VSS onRA    Pertinent Vitals/ Pain       Pain Assessment: Faces Faces Pain Scale: Hurts a little bit Pain Location: ribs/chest with bed mobility Pain Descriptors / Indicators: Sharp;Sore;Shooting Pain Intervention(s): Limited activity within patient's tolerance;Monitored during session  Home Living                                          Prior Functioning/Environment              Frequency  Min 2X/week        Progress Toward Goals  OT Goals(current goals can now be found in the care plan section)  Progress towards OT goals: Progressing toward goals  Acute Rehab OT Goals Patient Stated Goal: to go to rehab OT Goal Formulation: With patient Time For Goal Achievement: 07/19/21 Potential to Achieve Goals: Good ADL Goals Pt Will Perform Grooming: with modified independence;standing Pt Will Perform Lower Body Bathing: with modified independence;sit to/from stand Pt Will Perform Lower Body Dressing: with modified independence;sit to/from stand Pt Will Transfer to Toilet: with modified independence;regular height toilet;ambulating Pt Will Perform Toileting -  Clothing Manipulation and hygiene: with modified independence;sit to/from stand Additional ADL Goal #1: Continue to assess vision functionally  Plan Discharge plan remains appropriate    Co-evaluation                 AM-PAC OT "6 Clicks" Daily Activity     Outcome Measure   Help from another person eating meals?: A Little Help from another person taking care of personal grooming?: A Little Help from another person toileting, which includes using toliet, bedpan, or urinal?: A Little Help from another person bathing (including washing, rinsing, drying)?: A Lot Help from another person to put on and taking off regular upper body clothing?: A Little Help from another person to put on and taking off regular lower body clothing?: A Lot 6 Click Score: 16    End of Session Equipment Utilized During Treatment: Gait belt;Rolling walker (2 wheels)  OT Visit Diagnosis: Unsteadiness on feet (R26.81);Pain;Muscle weakness (generalized) (M62.81);Other symptoms and signs involving cognitive function;Low vision, both eyes (H54.2)   Activity Tolerance Patient tolerated treatment well   Patient Left in chair;with chair alarm set;with call bell/phone within reach   Nurse Communication Mobility status        Time: 2542-7062 OT Time Calculation (min): 18 min  Charges: OT General Charges $OT Visit: 1 Visit  OT Treatments $Self Care/Home Management : 8-22 mins    Belinda Bringhurst A Kailah Pennel 07/14/2021, 4:52 PM

## 2021-07-14 NOTE — Progress Notes (Signed)
[facility] Progress Note   Subjective: seen in room, no c/o's, in good spirits  Objective Vitals:   07/14/21 0020 07/14/21 0412 07/14/21 0416 07/14/21 0752  BP: (!) 127/50 125/90  133/85  Pulse: 64 65  64  Resp: 18 17    Temp: 98.3 F (36.8 C) 98.2 F (36.8 C)    TempSrc: Oral Oral    SpO2: 100% 99%  97%  Weight:   63.9 kg   Height:         Physical Exam General: Elderly woman, no distress, pleasant Heart: RRR. No m,r,g  Lungs: Scattered wheeze  Abdomen: Soft non-tender  Extremities: No sig LE edema  Dialysis Access: LUE AVG +bruit     Home meds - coreg 12.5 bid, procardia xl 30 hs, zocor, requip, prilosec, MVI, advil, auryxia 1 tid ac, zyloprim 100, prns/ vits/ supps    Dialysis Orders:  [facility]  3.5h   64.5kg  2/2 bath  400/500   LUA AVG  Hep none  Sensipar 30 ug tiw po Hectorol 5 ug tiw IV Mircera 75 ug q 2wks , last 12/01  Venofer 50 mg q. Weekly    Assessment/Plan: Acute CVA - L MCA by CT and MRI, +aphasia, on DAPT. Microembolic infarctions. EP consulted for loop recorder.  PEA arrest - Out of hospital, per primary ESRD - TTS HD. Next HD 12/20  Rib fractures/ chest pain - due to CPR Pneumonia- improved, completed course of Rocephin/ azithro IV here HTN/vol - BP's high. Coreg/ procardia as at home, clonidine added. 1kg under dry. Increased procardia to 60 qd. BP's better today.  Anemia-  Hb 9- 10. Weekly Aranesp 60ug on thursdays, last 12/15.  CKD-BMD- Ca ok, P ok   Kelly Splinter, MD 07/14/2021, 12:25 PM      Basic Metabolic Panel: Recent Labs  Lab 07/08/21 0041 07/09/21 0800 07/11/21 0840  NA 138 139 140  K 3.8 3.5 4.1  CL 101 104 104  CO2 25 26 27   GLUCOSE 90 95 95  BUN 26* 32* 37*  CREATININE 6.02* 6.48* 6.45*  CALCIUM 8.9 8.7* 9.0  PHOS  --  3.5 4.2    CBC: Recent Labs  Lab 07/08/21 0041 07/09/21 0802 07/11/21 0303  WBC 9.5 6.0 7.7  HGB 9.4* 8.8* 10.1*  HCT 30.9* 28.1* 31.6*  MCV 93.6 93.0 91.1  PLT  154 176 192       Medications:  sodium chloride Stopped (07/05/21 1832)     stroke: mapping our early stages of recovery book   Does not apply Once   acetaminophen  1,000 mg Oral TID   allopurinol  100 mg Oral Daily   aspirin EC  325 mg Oral Daily   carvedilol  25 mg Oral 2 times per day on Sun Mon Wed Fri   Chlorhexidine Gluconate Cloth  6 each Topical Daily   cloNIDine  0.1 mg Oral BID   clopidogrel  75 mg Oral Daily   darbepoetin (ARANESP) injection - DIALYSIS  60 mcg Intravenous Q Thu-HD   docusate sodium  100 mg Oral BID   feeding supplement (NEPRO CARB STEADY)  237 mL Oral BID BM   heparin  5,000 Units Subcutaneous Q8H   insulin aspart  0-9 Units Subcutaneous TID WC   multivitamin  1 tablet Oral QHS   NIFEdipine  60 mg Oral QHS   pantoprazole  40 mg Oral QHS   polyethylene glycol  17 g Oral Daily   rOPINIRole  0.5  mg Oral QHS   senna  2 tablet Oral Daily   simvastatin  20 mg Oral QHS

## 2021-07-14 NOTE — Progress Notes (Signed)
PROGRESS NOTE  Pamalee Colonna  AQT:[id number] DOB: 1932-08-04 DOA: 07/04/2021 PCP: Mindi Curling, PA-C   Brief Narrative: Stacey Dorsey is an 87 yo F hx ESRD on HD, poorly controlled HTN, T2DM, HLD, (presumed) COPD on chronic supplemental oxygen who presented to [facility] 12/8 following cardiac arrest. Patient was in usual state of health until 12/8 morning when she felt short of breath while getting ready to go to dialysis. EMS was called, on EMS arrival pt was altered and subsequently sustained witnessed cardiac arrest -- PEA. Received 15 min CPR, 1 epi, 1g calcium, 50mg  bicarb, as well as 126mcg fent and  5mg  versed. King placed in field which was exchanged for ETT in ED. CXR revealed right-sided airspace disease. Opacities confirmed on CTA chest which showed no PE, suggested volume overload, and multiple bilateral rib fractures. CT head showed only encephalomalacia from old anterior MCA-territory stroke. Started on rocephin and azithromycin for pneumonia. Echocardiogram revealed LVEF 50-55%, G1DD, normal RV size and function, severely elevated PASP with estimated RVSP of 61.29mmHg. Volume status has been regulated by hemodialysis. Ultimately was extubated 12/9 and transferred out of ICU 12/11. CT head on that date due to memory difficulties was stable from admission. Due to persistence of symptoms, MRI brain was performed showing multiple infarcts suspected to be due to microemboli during resuscitation. Neurology consulted, stroke work-up completed.  Patient now stable for DC pending insurance preauthorization for CIR.  Assessment & Plan: Principal Problem:   Cardiac arrest [facility]) Active Problems:   Malnutrition of moderate degree   Cerebral embolism with cerebral infarction   OOH PEA cardiac arrest: ROSC after 15 minutes. With asymmetric infiltrates, preceding dyspnea and preserved LVEF, pneumonia/hypoxia is presumptive trigger.  -Remains in sinus rhythm on monitor. -CTA chest 12/8: No PE. -2D echo  12/8: LVEF 50-55%.  Rib fractures with musculoskeletal chest pain:  -CTA chest 12/8 showed acute fractures of the right fourth and sixth through eighth ribs and left sixth through ninth ribs. - Continue pain medication: Changed oxycodone prn to hydromorphone prn due to ESRD.  Still reports bilateral rib cage pain, especially on deep breathing changing positions.  Changed acetaminophen to 1 g 3 times daily.  If no improvement, could consider lidocaine patches. - Pulmonary hygiene.  -Patient reports that her pain is now better controlled on this multimodality treatment.  Encouraged incentive spirometry, discussed with RN to provide same. -As per nephrology, patient had transient chest pain at HD 12/16, EKG without acute changes.  Suspect this is likely musculoskeletal.  However to be sure, checked high-sensitivity troponin, 51 > 45, not consistent with ACS. -Chest pain gradually improving.  Now controlled on oral Tylenol alone.  Has not used p.o. Dilaudid for almost 48 hours.  Reports that she is pulling 500 on incentive spirometry, encouraged to do more.  Acute metabolic encephalopathy, acute stroke: Suspected to be delirium as well as expressive aphasia due to stroke. EEG 12/12 was suggestive of mild, nonspecific, diffuse encephalopathy without seizures or epileptiform discharges.  - CT head suggested anterior left MCA stroke/encephalomalacia further defined by MRI as multiple microembolic infarcts in that distribution.  Neurology/stroke MD input appreciated: Left MCA scattered several punctate infarcts, etiology unclear but possibly cardioembolic versus cryptogenic.  MRI brain showed approximately 10 punctate acute infarction scattered within the left MCA territory consistent with microembolic infarctions.  MRA head: Moderate to severe narrowing in the left supraclinoid ICA.  Decreased perfusion in the left distal MCA branches, without focal MCA stenosis or occlusion.  Multifocal narrowing of the  right  vertebral artery, which appears occluded just proximal to the vertebrobasilar junction.  CUS: Right ICA 40-59% stenosis, left ICA 1-39% stenosis. B/L LEV Dopplers: No DVT.  TTE: LVEF 19-14%, grade 1 diastolic dysfunction.  A1c 5.7.  LDL 73.  SLP and OT recommend CIR, consulted.  Per neurology, patient and family have agreed on a loop recorder and neurology is consulting EP cardiology for placement of same.  No antithrombotics PTA, now on DAPT with aspirin and Plavix.  As per EP cardiology input, not an appropriate candidate for loop recorder and recommend 30-day event monitor with outpatient follow-up pending course and recovery to further discuss possibility of loop monitoring in the future.  EP cardiology will arrange same.  Acute on chronic hypoxic respiratory failure: Due to right-sided CAP vs. aspiration pneumonia - Continue antibiotics x7 days per PCCM.  Completed IV ceftriaxone 12/15. - Continue aggressive pulmonary hygiene - Consider outpatient PFTs/pulmonary follow up. -Currently saturating in the high 90s on room air.  ESRD:  - Continue HD TTS per nephrology.  Seen at HD 12/17.  Nephrology continues to follow.  HTN:  - Continue coreg, nifedipine. Clonidine was added.  Nifedipine increased from 30 to 60 mg daily.  Blood pressures controlled now.  T2DM: HbA1c 5.7 %. Would not attempt too rigid of control to avoid neuroglycopenia.  - SSI   AOCKD and iron deficiency anemia:  - Per nephrology. Continue monitoring. -Hemoglobin has dropped from 9.4-8.8 in the absence of overt bleeding.  May be a lab error.  Hemoglobin up to 10.1.  Gout: Chronic, quiescent.  - Continue allopurinol.  RLS:  - Continue ropinirole  HLD:  - Continue statin  Moderate protein-calorie malnutrition:  - MVM, protein supplementation  Constipation -Initiated bowel regimen pain  DVT prophylaxis: Heparin  Code Status: Full Family Communication: None at bedside. Disposition Plan:  Status is:  Inpatient  Medically optimized for discharge.  As per CIR coordinator, awaiting determination from patient's insurance regarding CIR prior authorization request.  Consultants:  PCCM Neurology  Procedures:  Echo  Antimicrobials: Ceftriaxone, azithromycin   Subjective: Patient seen along with her RN in the room.  Reports that she feels "good".  Indicates that over the last several days, her chest pain has gradually improved, able to move around.  Still hurts some on taking deep breaths.  Doing incentive spirometry.  Objective: Vitals:   07/14/21 0020 07/14/21 0412 07/14/21 0416 07/14/21 0752  BP: (!) 127/50 125/90  133/85  Pulse: 64 65  64  Resp: 18 17    Temp: 98.3 F (36.8 C) 98.2 F (36.8 C)    TempSrc: Oral Oral    SpO2: 100% 99%  97%  Weight:   63.9 kg   Height:        Intake/Output Summary (Last 24 hours) at 07/14/2021 1114 Last data filed at 07/13/2021 1730 Gross per 24 hour  Intake 420 ml  Output 0 ml  Net 420 ml   Filed Weights   07/11/21 1212 07/13/21 0852 07/14/21 0416  Weight: 61.4 kg 62.8 kg 63.9 kg   General exam: Elderly female, moderately built and nourished lying comfortably propped up in bed undergoing HD via aVF in left upper arm.  Appeared in good spirits. Respiratory system: Reproducible mild chest wall tenderness bilaterally.  Clear to auscultation.  No increased work of breathing. Cardiovascular system: S1 & S2 heard, RRR. No JVD, murmurs, rubs, gallops or clicks. No pedal edema.   Gastrointestinal system: Abdomen is nondistended, soft and nontender.  Normal bowel  sounds heard. Central nervous system: Alert and oriented.?  Subtle diminished right nasolabial fold compared to left.  Did not appreciate any significant dysarthria or aphasia during my exam. Extremities: Symmetric 5 x 5 power. Skin: No rashes, lesions or ulcers Psychiatry: Judgement and insight appear normal. Mood & affect appropriate.    Data Reviewed: I have personally reviewed  following labs and imaging studies  CBC: Recent Labs  Lab 07/08/21 0041 07/09/21 0802 07/11/21 0303  WBC 9.5 6.0 7.7  HGB 9.4* 8.8* 10.1*  HCT 30.9* 28.1* 31.6*  MCV 93.6 93.0 91.1  PLT 154 176 213   Basic Metabolic Panel: Recent Labs  Lab 07/08/21 0041 07/09/21 0800 07/11/21 0840  NA 138 139 140  K 3.8 3.5 4.1  CL 101 104 104  CO2 25 26 27   GLUCOSE 90 95 95  BUN 26* 32* 37*  CREATININE 6.02* 6.48* 6.45*  CALCIUM 8.9 8.7* 9.0  MG 2.2  --   --   PHOS  --  3.5 4.2   GFR: Estimated Creatinine Clearance: 6.3 mL/min (A) (by C-G formula based on SCr of 6.45 mg/dL (H)). Liver Function Tests: Recent Labs  Lab 07/09/21 0800 07/11/21 0840  AST  --  24  ALT  --  16  ALKPHOS  --  75  BILITOT  --  0.5  PROT  --  5.9*  ALBUMIN 2.8* 2.8*   2.8*    No results for input(s): AMMONIA in the last 168 hours.   HbA1C: No results for input(s): HGBA1C in the last 72 hours.  CBG: Recent Labs  Lab 07/12/21 2120 07/13/21 0811 07/13/21 1319 07/13/21 1549 07/14/21 0616  GLUCAP 190* 111* 121* 133* 113*   Lipid Profile: No results for input(s): CHOL, HDL, LDLCALC, TRIG, CHOLHDL, LDLDIRECT in the last 72 hours.  Urine analysis:    Component Value Date/Time   COLORURINE YELLOW 07/04/2021 1205   APPEARANCEUR HAZY (A) 07/04/2021 1205   LABSPEC 1.015 07/04/2021 1205   PHURINE 8.5 (H) 07/04/2021 1205   GLUCOSEU >=500 (A) 07/04/2021 1205   HGBUR SMALL (A) 07/04/2021 1205   BILIRUBINUR NEGATIVE 07/04/2021 [place] 07/04/2021 1205   PROTEINUR 100 (A) 07/04/2021 1205   NITRITE NEGATIVE 07/04/2021 1205   LEUKOCYTESUR NEGATIVE 07/04/2021 1205   Recent Results (from the past 240 hour(s))  Culture, blood (routine x 2)     Status: None   Collection Time: 07/04/21 11:23 AM   Specimen: Right Antecubital; Blood  Result Value Ref Range Status   Specimen Description RIGHT ANTECUBITAL  Final   Special Requests BOTTLES DRAWN AEROBIC ONLY  Final   Culture   Final     NO GROWTH 5 DAYS Performed at [facility], [address] [place], [place] [address]    Report Status 07/09/2021 FINAL  Final  Culture, blood (routine x 2)     Status: None   Collection Time: 07/04/21 11:23 AM   Specimen: Right Antecubital; Blood  Result Value Ref Range Status   Specimen Description RIGHT ANTECUBITAL  Final   Special Requests ANAEROBIC  Final   Culture   Final    NO GROWTH 5 DAYS Performed at [facility], [place] [address], [place], [place] [address]    Report Status 07/09/2021 FINAL  Final  Respiratory (~20 pathogens) panel by PCR     Status: None   Collection Time: 07/04/21  1:15 PM   Specimen: Nasopharyngeal Swab; Respiratory  Result Value Ref Range Status   Adenovirus  NOT DETECTED NOT DETECTED Final   Coronavirus 229E NOT DETECTED NOT DETECTED Final    Comment: (NOTE) The Coronavirus on the Respiratory Panel, DOES NOT test for the novel  Coronavirus (2019 nCoV)    Coronavirus HKU1 NOT DETECTED NOT DETECTED Final   Coronavirus NL63 NOT DETECTED NOT DETECTED Final   Coronavirus OC43 NOT DETECTED NOT DETECTED Final   Metapneumovirus NOT DETECTED NOT DETECTED Final   Rhinovirus / Enterovirus NOT DETECTED NOT DETECTED Final   Influenza A NOT DETECTED NOT DETECTED Final   Influenza B NOT DETECTED NOT DETECTED Final   Parainfluenza Virus 1 NOT DETECTED NOT DETECTED Final   Parainfluenza Virus 2 NOT DETECTED NOT DETECTED Final   Parainfluenza Virus 3 NOT DETECTED NOT DETECTED Final   Parainfluenza Virus 4 NOT DETECTED NOT DETECTED Final   Respiratory Syncytial Virus NOT DETECTED NOT DETECTED Final   Bordetella pertussis NOT DETECTED NOT DETECTED Final   Bordetella Parapertussis NOT DETECTED NOT DETECTED Final   Chlamydophila pneumoniae NOT DETECTED NOT DETECTED Final   Mycoplasma pneumoniae NOT DETECTED NOT DETECTED Final    Comment: Performed at [facility], [place] [address] [place], [place] [address]  MRSA Next Gen by PCR, Nasal     Status:  None   Collection Time: 07/04/21  4:32 PM   Specimen: Nasal Mucosa; Nasal Swab  Result Value Ref Range Status   MRSA by PCR Next Gen NOT DETECTED NOT DETECTED Final    Comment: (NOTE) The GeneXpert MRSA Assay (FDA approved for NASAL specimens only), is one component of a comprehensive MRSA colonization surveillance program. It is not intended to diagnose MRSA infection nor to guide or monitor treatment for MRSA infections. Test performance is not FDA approved in patients less than 37 years old. Performed at [facility], [place] [address] [place], [place] [address]       Radiology Studies: No results found.  Scheduled Meds:  white petrolatum        stroke: mapping our early stages of recovery book   Does not apply Once   acetaminophen  1,000 mg Oral TID   allopurinol  100 mg Oral Daily   aspirin EC  325 mg Oral Daily   carvedilol  25 mg Oral 2 times per day on Sun Mon Wed Fri   Chlorhexidine Gluconate Cloth  6 each Topical Daily   cloNIDine  0.1 mg Oral BID   clopidogrel  75 mg Oral Daily   darbepoetin (ARANESP) injection - DIALYSIS  60 mcg Intravenous Q Thu-HD   docusate sodium  100 mg Oral BID   feeding supplement (NEPRO CARB STEADY)  237 mL Oral BID BM   heparin  5,000 Units Subcutaneous Q8H   insulin aspart  0-9 Units Subcutaneous TID WC   multivitamin  1 tablet Oral QHS   NIFEdipine  60 mg Oral QHS   pantoprazole  40 mg Oral QHS   polyethylene glycol  17 g Oral Daily   rOPINIRole  0.5 mg Oral QHS   senna  2 tablet Oral Daily   simvastatin  20 mg Oral QHS   Continuous Infusions:  sodium chloride Stopped (07/05/21 1832)     LOS: 10 days   Vernell Leep, MD,  FACP, [facility], [facility], [facility] (Care Management Physician Certified) [place]  To contact the attending provider between 7A-7P or the covering provider during after hours 7P-7A, please log into the web site [email or web address] and access using universal [place] password  for that web site. If you do not have the password, please call the hospital operator.

## 2021-07-14 NOTE — Progress Notes (Signed)
Hypoglycemic Event  CBG:  66 Treatment: 8 oz juice/soda  Symptoms: none  Follow-up CBG: 143Time:1717 CBG Result  Possible Reasons for Event: SSI coverage caused drop after lunch; low meal intake  Comments/MD notified:sent page  Resolved with hypoglycemic tx  Randel Books

## 2021-07-15 LAB — GLUCOSE, CAPILLARY
Glucose-Capillary: 110 mg/dL — ABNORMAL HIGH (ref 70–99)
Glucose-Capillary: 141 mg/dL — ABNORMAL HIGH (ref 70–99)
Glucose-Capillary: 121 mg/dL — ABNORMAL HIGH (ref 70–99)
Glucose-Capillary: 145 mg/dL — ABNORMAL HIGH (ref 70–99)

## 2021-07-15 NOTE — Progress Notes (Signed)
Physical Therapy Treatment Patient Details Name: Stacey Dorsey MRN: [id number] DOB: 24-Sep-1932 Today's Date: 07/15/2021   History of Present Illness Pt is an 87 y/o female presenting 12/8 after cardiac arrest requiring 15 min CPR prior to ROSC. Intubated prior to arrival, extubated 12/9. On 12/11, pt with new onset word-finding, rapid response called and neurology consulted. 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 PMHx includes: ESRD on HD, poorly controlled HTN, DM, HLD, (presumed) COPD.    PT Comments    Pt received in supine, agreeable to therapy session and with good participation and tolerance for gait and transfer training. Pt c/o rib pain so encouraged manual splinting during bed mobility and pt needing up to minA for transfers and mod cues and minA with RW support for household distance gait trial.  Pt continues to c/o some visual deficits (not able to specify what issues she is having, will need further visual assessment) but not orthostatic this session (see below) and less c/o dizziness than in previous session. Pt continues to benefit from PT services to progress toward functional mobility goals.   Recommendations for follow up therapy are one component of a multi-disciplinary discharge planning process, led by the attending physician.  Recommendations may be updated based on patient status, additional functional criteria and insurance authorization.  Follow Up Recommendations  Acute inpatient rehab (3hours/day)     Assistance Recommended at Discharge Frequent or constant Supervision/Assistance  Equipment Recommendations  BSC/3in1;Rolling walker (2 wheels)    Recommendations for Other Services Rehab consult     Precautions / Restrictions Precautions Precautions: Fall Precaution Comments: rib fxs (R4, 6-8 and L  6-9th) Restrictions Weight Bearing Restrictions: No     Mobility  Bed Mobility Overal bed mobility: Needs Assistance Bed Mobility: Rolling;Sidelying to Sit;Sit to Sidelying Rolling: Min assist Sidelying to sit: Min assist     Sit to sidelying: Min assist General bed mobility comments: pt hugging heart pillow, min A for trunk elevation/mod cues for technique and splinting    Transfers Overall transfer level: Needs assistance Equipment used: Rolling walker (2 wheels) Transfers: Sit to/from Stand Sit to Stand: Min assist           General transfer comment: from EOB to RW x3 trials, pt needs reminders for safe hand placement and unable to achieve upright with only min guard, needs minA lift assist    Ambulation/Gait Ambulation/Gait assistance: Min assist Gait Distance (Feet): 125 Feet (48ft, seated break, 190ft) Assistive device: Rolling walker (2 wheels) Gait Pattern/deviations: Step-through pattern;Decreased stride length;Shuffle;Narrow base of support;Drifts right/left Gait velocity: decreased     General Gait Details: Pt drifting to L side of hallway and poor awareness of obstacles in room/on Rt side, needs manual assist at times for RW proximity/mgmt and cues to widen BOS but no overt scissoring this date   Stairs             Wheelchair Mobility    Modified Rankin (Stroke Patients Only) Modified Rankin (Stroke Patients Only) Pre-Morbid Rankin Score: No symptoms Modified Rankin: Moderately severe disability     Balance Overall balance assessment: Needs assistance Sitting-balance support: No upper extremity supported;Feet supported Sitting balance-Leahy Scale: Fair     Standing balance support: During functional activity;Single extremity supported Standing balance-Leahy Scale: Poor Standing balance comment: static standing single UE support, needs BUE support for dynamic tasks  Cognition Arousal/Alertness:  Awake/alert Behavior During Therapy: WFL for tasks assessed/performed Overall Cognitive Status: Impaired/Different from baseline Area of Impairment: Attention;Following commands;Safety/judgement;Awareness;Problem solving                   Current Attention Level: Sustained Memory: Decreased recall of precautions Following Commands: Follows one step commands with increased time Safety/Judgement: Decreased awareness of safety;Decreased awareness of deficits Awareness: Emergent Problem Solving: Requires verbal cues;Slow processing General Comments: step by step sequencing cues        Exercises      General Comments General comments (skin integrity, edema, etc.): BP 159/66 (91) supine, HR 67 supine; BP 159/58 HR 66 seated EOB; BP 150/55 standing, "not dizzy", HR 73 bpm standing      Pertinent Vitals/Pain Pain Assessment: Faces Faces Pain Scale: Hurts little more Pain Location: ribs/chest with bed mobility Pain Descriptors / Indicators: Sharp;Sore;Shooting Pain Intervention(s): Monitored during session;Repositioned;Other (comment) (encouraged manual splinting with pillow prior to rolling/pushing up)    Home Living                          Prior Function            PT Goals (current goals can now be found in the care plan section) Acute Rehab PT Goals Patient Stated Goal: to return home PT Goal Formulation: With patient Time For Goal Achievement: 07/20/21 Progress towards PT goals: Progressing toward goals    Frequency    Min 4X/week      PT Plan Current plan remains appropriate;Frequency needs to be updated    Co-evaluation              AM-PAC PT "6 Clicks" Mobility   Outcome Measure  Help needed turning from your back to your side while in a flat bed without using bedrails?: A Little Help needed moving from lying on your back to sitting on the side of a flat bed without using bedrails?: A Lot (mod cues) Help needed moving to and from a  bed to a chair (including a wheelchair)?: A Lot (mod cues) Help needed standing up from a chair using your arms (e.g., wheelchair or bedside chair)?: A Lot (mod cues) Help needed to walk in hospital room?: A Lot (mod cues) Help needed climbing 3-5 steps with a railing? : Total 6 Click Score: 12    End of Session Equipment Utilized During Treatment: Gait belt Activity Tolerance: Patient tolerated treatment well Patient left: in bed;with bed alarm set;with call bell/phone within reach;with family/visitor present Nurse Communication: Mobility status PT Visit Diagnosis: Unsteadiness on feet (R26.81);Difficulty in walking, not elsewhere classified (R26.2)     Time: 3295-1884 PT Time Calculation (min) (ACUTE ONLY): 26 min  Charges:  $Gait Training: 8-22 mins $Therapeutic Activity: 8-22 mins                     Merla Sawka P., PTA Acute Rehabilitation Services Pager: [phone] Office: [place] 07/15/2021, 6:06 PM

## 2021-07-15 NOTE — Progress Notes (Signed)
PROGRESS NOTE  Stacey Dorsey  AYT:[id number] DOB: July 16, 1933 DOA: 07/04/2021 PCP: Mindi Curling, PA-C   Brief Narrative: Stacey Dorsey is an 87 yo F hx ESRD on HD, poorly controlled HTN, T2DM, HLD, (presumed) COPD on chronic supplemental oxygen who presented to [facility] 12/8 following cardiac arrest. Patient was in usual state of health until 12/8 morning when she felt short of breath while getting ready to go to dialysis. EMS was called, on EMS arrival pt was altered and subsequently sustained witnessed cardiac arrest -- PEA. Received 15 min CPR, 1 epi, 1g calcium, 50mg  bicarb, as well as 17mcg fent and  5mg  versed. King placed in field which was exchanged for ETT in ED. CXR revealed right-sided airspace disease. Opacities confirmed on CTA chest which showed no PE, suggested volume overload, and multiple bilateral rib fractures. CT head showed only encephalomalacia from old anterior MCA-territory stroke. Started on rocephin and azithromycin for pneumonia. Echocardiogram revealed LVEF 50-55%, G1DD, normal RV size and function, severely elevated PASP with estimated RVSP of 61.14mmHg. Volume status has been regulated by hemodialysis. Ultimately was extubated 12/9 and transferred out of ICU 12/11. CT head on that date due to memory difficulties was stable from admission. Due to persistence of symptoms, MRI brain was performed showing multiple infarcts suspected to be due to microemboli during resuscitation. Neurology consulted, stroke work-up completed.  Patient now stable for DC pending insurance preauthorization for CIR.  Assessment & Plan: Principal Problem:   Cardiac arrest [facility]) Active Problems:   Malnutrition of moderate degree   Cerebral embolism with cerebral infarction   OOH PEA cardiac arrest: ROSC after 15 minutes. With asymmetric infiltrates, preceding dyspnea and preserved LVEF, pneumonia/hypoxia is presumptive trigger.  -Remains in sinus rhythm on monitor. -CTA chest 12/8: No PE. -2D echo  12/8: LVEF 50-55%.  Rib fractures with musculoskeletal chest pain:  -CTA chest 12/8 showed acute fractures of the right fourth and sixth through eighth ribs and left sixth through ninth ribs. - Continue pain medication: Changed oxycodone prn to hydromorphone prn due to ESRD.  Still reports bilateral rib cage pain, especially on deep breathing changing positions.  Changed acetaminophen to 1 g 3 times daily.  If no improvement, could consider lidocaine patches. - Pulmonary hygiene.  -Patient reports that her pain is now better controlled on this multimodality treatment.  Encouraged incentive spirometry, discussed with RN to provide same. -As per nephrology, patient had transient chest pain at HD 12/16, EKG without acute changes.  Suspect this is likely musculoskeletal.  However to be sure, checked high-sensitivity troponin, 51 > 45, not consistent with ACS. -Chest pain gradually improving.  Now controlled on oral Tylenol alone.  Has not used p.o. Dilaudid for a couple days now.  Reports that she is pulling 500 on incentive spirometry, encouraged to do more.  Explained to her that the chest pain will gradually improve over the next several weeks.  She verbalized understanding but asked the same question daily suggesting impaired memory and some cognitive impairment as well.  Acute metabolic encephalopathy, acute stroke: Suspected to be delirium as well as expressive aphasia due to stroke. EEG 12/12 was suggestive of mild, nonspecific, diffuse encephalopathy without seizures or epileptiform discharges.  - CT head suggested anterior left MCA stroke/encephalomalacia further defined by MRI as multiple microembolic infarcts in that distribution.  Neurology/stroke MD input appreciated: Left MCA scattered several punctate infarcts, etiology unclear but possibly cardioembolic versus cryptogenic.  MRI brain showed approximately 10 punctate acute infarction scattered within the left MCA territory  consistent with  microembolic infarctions.  MRA head: Moderate to severe narrowing in the left supraclinoid ICA.  Decreased perfusion in the left distal MCA branches, without focal MCA stenosis or occlusion.  Multifocal narrowing of the right vertebral artery, which appears occluded just proximal to the vertebrobasilar junction.  CUS: Right ICA 40-59% stenosis, left ICA 1-39% stenosis. B/L LEV Dopplers: No DVT.  TTE: LVEF 35-46%, grade 1 diastolic dysfunction.  A1c 5.7.  LDL 73.  SLP and OT recommend CIR, consulted.  Per neurology, patient and family have agreed on a loop recorder and neurology is consulting EP cardiology for placement of same.  No antithrombotics PTA, now on DAPT with aspirin and Plavix.  As per EP cardiology input, not an appropriate candidate for loop recorder and recommend 30-day event monitor with outpatient follow-up pending course and recovery to further discuss possibility of loop monitoring in the future.  EP cardiology will arrange same.  Acute on chronic hypoxic respiratory failure: Due to right-sided CAP vs. aspiration pneumonia - Continue antibiotics x7 days per PCCM.  Completed IV ceftriaxone 12/15. - Continue aggressive pulmonary hygiene - Consider outpatient PFTs/pulmonary follow up. -Currently saturating in the high 90s on room air.  ESRD:  - Continue HD TTS per nephrology.  Seen at HD 12/17.  Nephrology continues to follow.  HTN:  - Continue coreg, nifedipine. Clonidine was added.  Nifedipine increased from 30 to 60 mg daily.  Blood pressures controlled now.  T2DM: HbA1c 5.7 %. Would not attempt too rigid of control to avoid neuroglycopenia.  -Hypoglycemic with CBG of 67 on 12/18 afternoon, likely due to SSI given for CBG of 266 earlier.  CBGs have been mostly well controlled and given normal A1c, around risk of hypoglycemia and hence SSI discontinued but continue to monitor CBGs.  AOCKD and iron deficiency anemia:  - Per nephrology. Continue monitoring. -Hemoglobin has dropped  from 9.4-8.8 in the absence of overt bleeding.  May be a lab error.  Hemoglobin up to 10.1.  Gout: Chronic, quiescent.  - Continue allopurinol.  RLS:  - Continue ropinirole  HLD:  - Continue statin  Moderate protein-calorie malnutrition:  - MVM, protein supplementation  Constipation -Initiated bowel regimen pain  DVT prophylaxis: Heparin  Code Status: Full Family Communication: None at bedside. Disposition Plan:  Status is: Inpatient  Medically optimized for discharge.  As per CIR coordinator, awaiting determination from patient's insurance regarding CIR prior authorization request.  Remains medically optimized for DC to CIR.  Communicated with TOC team during progression rounds.  Insurance prior authorization pending  Consultants:  PCCM Neurology  Procedures:  Echo  Antimicrobials: Ceftriaxone, azithromycin   Subjective: No new complaints.  Ongoing chest wall pain but better compared to last week.  Able to move herself in bed and breathe better.  No other complaints reported.  Forgetful and asked the same questions daily.  Objective: Vitals:   07/14/21 2326 07/15/21 0410 07/15/21 0500 07/15/21 0737  BP: (!) 126/51 (!) 126/45  (!) 151/69  Pulse: 64 62  66  Resp: 18 19  18   Temp: 98.1 F (36.7 C) 97.8 F (36.6 C)  98.1 F (36.7 C)  TempSrc: Oral Oral  Oral  SpO2: 98% 97%  100%  Weight:   63.5 kg   Height:        Intake/Output Summary (Last 24 hours) at 07/15/2021 1031 Last data filed at 07/14/2021 2151 Gross per 24 hour  Intake 20 ml  Output --  Net 20 ml   [employer]  07/13/21 0852 07/14/21 0416 07/15/21 0500  Weight: 62.8 kg 63.9 kg 63.5 kg   General exam: Elderly female, moderately built and nourished lying comfortably propped up in bed . aVF in left upper arm.   Respiratory system: Clear to auscultation.  No increased work of breathing. Cardiovascular system: S1 & S2 heard, RRR. No JVD, murmurs, rubs, gallops or clicks. No pedal edema.    Gastrointestinal system: Abdomen is nondistended, soft and nontender.  Normal bowel sounds heard. Central nervous system: Alert and oriented.?  Subtle diminished right nasolabial fold compared to left.  Did not appreciate any significant dysarthria or aphasia during my exam. Extremities: Symmetric 5 x 5 power. Skin: No rashes, lesions or ulcers Psychiatry: Judgement and insight impaired.  Forgetful.  Likely has underlying cognitive impairment. Mood & affect appropriate and pleasant.    Data Reviewed: I have personally reviewed following labs and imaging studies  CBC: Recent Labs  Lab 07/09/21 0802 07/11/21 0303  WBC 6.0 7.7  HGB 8.8* 10.1*  HCT 28.1* 31.6*  MCV 93.0 91.1  PLT 176 737   Basic Metabolic Panel: Recent Labs  Lab 07/09/21 0800 07/11/21 0840  NA 139 140  K 3.5 4.1  CL 104 104  CO2 26 27  GLUCOSE 95 95  BUN 32* 37*  CREATININE 6.48* 6.45*  CALCIUM 8.7* 9.0  PHOS 3.5 4.2   GFR: Estimated Creatinine Clearance: 6.3 mL/min (A) (by C-G formula based on SCr of 6.45 mg/dL (H)). Liver Function Tests: Recent Labs  Lab 07/09/21 0800 07/11/21 0840  AST  --  24  ALT  --  16  ALKPHOS  --  75  BILITOT  --  0.5  PROT  --  5.9*  ALBUMIN 2.8* 2.8*   2.8*    No results for input(s): AMMONIA in the last 168 hours.   HbA1C: No results for input(s): HGBA1C in the last 72 hours.  CBG: Recent Labs  Lab 07/14/21 1221 07/14/21 1603 07/14/21 1717 07/14/21 2224 07/15/21 0555  GLUCAP 266* 67* 143* 186* 110*   Lipid Profile: No results for input(s): CHOL, HDL, LDLCALC, TRIG, CHOLHDL, LDLDIRECT in the last 72 hours.  Urine analysis:    Component Value Date/Time   COLORURINE YELLOW 07/04/2021 1205   APPEARANCEUR HAZY (A) 07/04/2021 1205   LABSPEC 1.015 07/04/2021 1205   PHURINE 8.5 (H) 07/04/2021 1205   GLUCOSEU >=500 (A) 07/04/2021 1205   HGBUR SMALL (A) 07/04/2021 1205   BILIRUBINUR NEGATIVE 07/04/2021 1205   [place] 07/04/2021 1205    PROTEINUR 100 (A) 07/04/2021 1205   NITRITE NEGATIVE 07/04/2021 1205   LEUKOCYTESUR NEGATIVE 07/04/2021 1205   No results found for this or any previous visit (from the past 240 hour(s)).     Radiology Studies: No results found.  Scheduled Meds:   stroke: mapping our early stages of recovery book   Does not apply Once   acetaminophen  1,000 mg Oral TID   allopurinol  100 mg Oral Daily   aspirin EC  325 mg Oral Daily   carvedilol  25 mg Oral 2 times per day on Sun Mon Wed Fri   Chlorhexidine Gluconate Cloth  6 each Topical Daily   cloNIDine  0.1 mg Oral BID   clopidogrel  75 mg Oral Daily   darbepoetin (ARANESP) injection - DIALYSIS  60 mcg Intravenous Q Thu-HD   docusate sodium  100 mg Oral BID   feeding supplement (NEPRO CARB STEADY)  237 mL Oral BID BM   heparin  5,000  Units Subcutaneous Q8H   multivitamin  1 tablet Oral QHS   NIFEdipine  60 mg Oral QHS   pantoprazole  40 mg Oral QHS   polyethylene glycol  17 g Oral Daily   rOPINIRole  0.5 mg Oral QHS   senna  2 tablet Oral Daily   simvastatin  20 mg Oral QHS   Continuous Infusions:  sodium chloride Stopped (07/05/21 1832)     LOS: 11 days   Vernell Leep, MD,  FACP, [facility], [facility], [facility] (Care Management Physician Certified) [place]  To contact the attending provider between 7A-7P or the covering provider during after hours 7P-7A, please log into the web site [email or web address] and access using universal [place] password for that web site. If you do not have the password, please call the hospital operator.

## 2021-07-15 NOTE — Progress Notes (Signed)
Patient ID: Barbaraann Share Wadas, female   DOB: 10-16-1932, 87 y.o.   MRN: [id number] S: Doing better and able to move herself over in bed. O:BP (!) 151/69 (BP Location: Right Arm)    Pulse 66    Temp 98.1 F (36.7 C) (Oral)    Resp 18    Ht 5\' 9"  (1.753 m)    Wt 63.5 kg    SpO2 100%    BMI 20.67 kg/m   Intake/Output Summary (Last 24 hours) at 07/15/2021 0851 Last data filed at 07/14/2021 2151 Gross per 24 hour  Intake 20 ml  Output --  Net 20 ml   Intake/Output: I/O last 3 completed shifts: In: 20 [P.O.:20] Out: -   Intake/Output this shift:  No intake/output data recorded. Weight change: 0.7 kg Gen: NAD CVS: RRR Resp:CTA Abd: +BS, soft, NT/Nd Ext: no edema, LUE AVG +T/B  Recent Labs  Lab 07/09/21 0800 07/11/21 0840  NA 139 140  K 3.5 4.1  CL 104 104  CO2 26 27  GLUCOSE 95 95  BUN 32* 37*  CREATININE 6.48* 6.45*  ALBUMIN 2.8* 2.8*   2.8*  CALCIUM 8.7* 9.0  PHOS 3.5 4.2  AST  --  24  ALT  --  16   Liver Function Tests: Recent Labs  Lab 07/09/21 0800 07/11/21 0840  AST  --  24  ALT  --  16  ALKPHOS  --  75  BILITOT  --  0.5  PROT  --  5.9*  ALBUMIN 2.8* 2.8*   2.8*   No results for input(s): LIPASE, AMYLASE in the last 168 hours. No results for input(s): AMMONIA in the last 168 hours. CBC: Recent Labs  Lab 07/09/21 0802 07/11/21 0303  WBC 6.0 7.7  HGB 8.8* 10.1*  HCT 28.1* 31.6*  MCV 93.0 91.1  PLT 176 192   Cardiac Enzymes: No results for input(s): CKTOTAL, CKMB, CKMBINDEX, TROPONINI in the last 168 hours. CBG: Recent Labs  Lab 07/14/21 1221 07/14/21 1603 07/14/21 1717 07/14/21 2224 07/15/21 0555  GLUCAP 266* 67* 143* 186* 110*    Iron Studies: No results for input(s): IRON, TIBC, TRANSFERRIN, FERRITIN in the last 72 hours. Studies/Results: No results found.   stroke: mapping our early stages of recovery book   Does not apply Once   acetaminophen  1,000 mg Oral TID   allopurinol  100 mg Oral Daily   aspirin EC  325 mg Oral Daily    carvedilol  25 mg Oral 2 times per day on Sun Mon Wed Fri   Chlorhexidine Gluconate Cloth  6 each Topical Daily   cloNIDine  0.1 mg Oral BID   clopidogrel  75 mg Oral Daily   darbepoetin (ARANESP) injection - DIALYSIS  60 mcg Intravenous Q Thu-HD   docusate sodium  100 mg Oral BID   feeding supplement (NEPRO CARB STEADY)  237 mL Oral BID BM   heparin  5,000 Units Subcutaneous Q8H   multivitamin  1 tablet Oral QHS   NIFEdipine  60 mg Oral QHS   pantoprazole  40 mg Oral QHS   polyethylene glycol  17 g Oral Daily   rOPINIRole  0.5 mg Oral QHS   senna  2 tablet Oral Daily   simvastatin  20 mg Oral QHS    BMET    Component Value Date/Time   NA 140 07/11/2021 0840   K 4.1 07/11/2021 0840   CL 104 07/11/2021 0840   CO2 27 07/11/2021 0840   GLUCOSE 95 07/11/2021  0840   BUN 37 (H) 07/11/2021 0840   CREATININE 6.45 (H) 07/11/2021 0840   CALCIUM 9.0 07/11/2021 0840   GFRNONAA 6 (L) 07/11/2021 0840   CBC    Component Value Date/Time   WBC 7.7 07/11/2021 0303   RBC 3.47 (L) 07/11/2021 0303   HGB 10.1 (L) 07/11/2021 0303   HCT 31.6 (L) 07/11/2021 0303   PLT 192 07/11/2021 0303   MCV 91.1 07/11/2021 0303   MCH 29.1 07/11/2021 0303   MCHC 32.0 07/11/2021 0303   RDW 16.9 (H) 07/11/2021 0303   LYMPHSABS 5.1 (H) 12/31/2020 2334   MONOABS 1.1 (H) 12/31/2020 2334   EOSABS 0.4 12/31/2020 2334   BASOSABS 0.1 12/31/2020 2334    Dialysis Orders:  [facility]  3.5h   64.5kg  2/2 bath  400/500   LUA AVG  Hep none  Sensipar 30 ug tiw po Hectorol 5 ug tiw IV Mircera 75 ug q 2wks , last 12/01  Venofer 50 mg q. Weekly       Assessment/Plan: Acute CVA - L MCA by CT and MRI, +aphasia, on DAPT. Microembolic infarctions. EP consulted for loop recorder.  PEA arrest - Out of hospital, per primary.   ESRD - TTS HD. Next HD 12/20 to keep on outpatient schedule. Rib fractures/ chest pain - due to CPR and still complaining of chest pain. Pneumonia- improved, completed course of Rocephin/  azithro IV here HTN/vol - BP's high. Coreg/ procardia as at home, clonidine added. 1kg under dry. Increased procardia to 60 qd. BP's better today.  Anemia-  Hb 9- 10. Weekly Aranesp 60ug on thursdays, last 12/15.  CKD-BMD- Ca ok, P ok    Donetta Potts, MD [employer] [phone]

## 2021-07-15 NOTE — Progress Notes (Signed)
Inpatient Rehab Admissions Coordinator:   Darrol Angel for update on prior auth request (submitted Thursday 11/15 at 1629).  Per 2 reps, case is "ready to be reviewed".  They were unable to offer me a timeframe for an expected determination.    Shann Medal, PT, DPT Admissions Coordinator [phone] 07/15/21  11:43 AM

## 2021-07-16 ENCOUNTER — Inpatient Hospital Stay (HOSPITAL_COMMUNITY)
Admission: RE | Admit: 2021-07-16 | Discharge: 2021-07-26 | DRG: 056 | Disposition: A | Discharge status: Home Health | Payer: Medicare Other | Source: Intra-hospital | Attending: Physical Medicine and Rehabilitation | Admitting: Physical Medicine and Rehabilitation

## 2021-07-16 ENCOUNTER — Other Ambulatory Visit: Payer: Self-pay

## 2021-07-16 ENCOUNTER — Encounter (HOSPITAL_COMMUNITY): Payer: Self-pay | Admitting: Physical Medicine and Rehabilitation

## 2021-07-16 DIAGNOSIS — E1122 Type 2 diabetes mellitus with diabetic chronic kidney disease: Secondary | ICD-10-CM | POA: Diagnosis present

## 2021-07-16 DIAGNOSIS — D631 Anemia in chronic kidney disease: Secondary | ICD-10-CM | POA: Diagnosis present

## 2021-07-16 DIAGNOSIS — I120 Hypertensive chronic kidney disease with stage 5 chronic kidney disease or end stage renal disease: Secondary | ICD-10-CM | POA: Diagnosis present

## 2021-07-16 DIAGNOSIS — R2689 Other abnormalities of gait and mobility: Secondary | ICD-10-CM | POA: Diagnosis present

## 2021-07-16 DIAGNOSIS — I69398 Other sequelae of cerebral infarction: Secondary | ICD-10-CM | POA: Diagnosis not present

## 2021-07-16 DIAGNOSIS — K5909 Other constipation: Secondary | ICD-10-CM | POA: Diagnosis present

## 2021-07-16 DIAGNOSIS — Z79899 Other long term (current) drug therapy: Secondary | ICD-10-CM | POA: Diagnosis not present

## 2021-07-16 DIAGNOSIS — D649 Anemia, unspecified: Secondary | ICD-10-CM | POA: Diagnosis present

## 2021-07-16 DIAGNOSIS — I69320 Aphasia following cerebral infarction: Secondary | ICD-10-CM

## 2021-07-16 DIAGNOSIS — Z91013 Allergy to seafood: Secondary | ICD-10-CM

## 2021-07-16 DIAGNOSIS — J449 Chronic obstructive pulmonary disease, unspecified: Secondary | ICD-10-CM | POA: Diagnosis present

## 2021-07-16 DIAGNOSIS — G2581 Restless legs syndrome: Secondary | ICD-10-CM | POA: Diagnosis present

## 2021-07-16 DIAGNOSIS — E785 Hyperlipidemia, unspecified: Secondary | ICD-10-CM | POA: Diagnosis present

## 2021-07-16 DIAGNOSIS — S2242XS Multiple fractures of ribs, left side, sequela: Secondary | ICD-10-CM | POA: Diagnosis not present

## 2021-07-16 DIAGNOSIS — K219 Gastro-esophageal reflux disease without esophagitis: Secondary | ICD-10-CM | POA: Diagnosis present

## 2021-07-16 DIAGNOSIS — G4700 Insomnia, unspecified: Secondary | ICD-10-CM | POA: Diagnosis not present

## 2021-07-16 DIAGNOSIS — Z888 Allergy status to other drugs, medicaments and biological substances status: Secondary | ICD-10-CM | POA: Diagnosis not present

## 2021-07-16 DIAGNOSIS — I69351 Hemiplegia and hemiparesis following cerebral infarction affecting right dominant side: Principal | ICD-10-CM

## 2021-07-16 DIAGNOSIS — I69319 Unspecified symptoms and signs involving cognitive functions following cerebral infarction: Secondary | ICD-10-CM | POA: Diagnosis not present

## 2021-07-16 DIAGNOSIS — N186 End stage renal disease: Secondary | ICD-10-CM | POA: Diagnosis present

## 2021-07-16 DIAGNOSIS — Z880 Allergy status to penicillin: Secondary | ICD-10-CM | POA: Diagnosis not present

## 2021-07-16 DIAGNOSIS — M109 Gout, unspecified: Secondary | ICD-10-CM | POA: Diagnosis present

## 2021-07-16 DIAGNOSIS — I6523 Occlusion and stenosis of bilateral carotid arteries: Secondary | ICD-10-CM | POA: Diagnosis present

## 2021-07-16 DIAGNOSIS — I63512 Cerebral infarction due to unspecified occlusion or stenosis of left middle cerebral artery: Secondary | ICD-10-CM | POA: Diagnosis not present

## 2021-07-16 DIAGNOSIS — Z8249 Family history of ischemic heart disease and other diseases of the circulatory system: Secondary | ICD-10-CM

## 2021-07-16 DIAGNOSIS — Z992 Dependence on renal dialysis: Secondary | ICD-10-CM | POA: Diagnosis not present

## 2021-07-16 DIAGNOSIS — E119 Type 2 diabetes mellitus without complications: Secondary | ICD-10-CM | POA: Diagnosis not present

## 2021-07-16 LAB — CBC
HCT: 28.8 % — ABNORMAL LOW (ref 36.0–46.0)
Hemoglobin: 9.1 g/dL — ABNORMAL LOW (ref 12.0–15.0)
MCH: 28.6 pg (ref 26.0–34.0)
MCHC: 31.6 g/dL (ref 30.0–36.0)
MCV: 90.6 fL (ref 80.0–100.0)
Platelets: 260 10*3/uL (ref 150–400)
RBC: 3.18 MIL/uL — ABNORMAL LOW (ref 3.87–5.11)
RDW: 17.2 % — ABNORMAL HIGH (ref 11.5–15.5)
WBC: 8.6 10*3/uL (ref 4.0–10.5)
nRBC: 0 % (ref 0.0–0.2)

## 2021-07-16 LAB — RENAL FUNCTION PANEL
Albumin: 2.7 g/dL — ABNORMAL LOW (ref 3.5–5.0)
Anion gap: 14 (ref 5–15)
BUN: 54 mg/dL — ABNORMAL HIGH (ref 8–23)
CO2: 24 mmol/L (ref 22–32)
Calcium: 8.7 mg/dL — ABNORMAL LOW (ref 8.9–10.3)
Chloride: 99 mmol/L (ref 98–111)
Creatinine, Ser: 7.5 mg/dL — ABNORMAL HIGH (ref 0.44–1.00)
GFR, Estimated: 5 mL/min — ABNORMAL LOW (ref >60)
Glucose, Bld: 115 mg/dL — ABNORMAL HIGH (ref 70–99)
Phosphorus: 6.1 mg/dL — ABNORMAL HIGH (ref 2.5–4.6)
Potassium: 4.7 mmol/L (ref 3.5–5.1)
Sodium: 137 mmol/L (ref 135–145)

## 2021-07-16 LAB — GLUCOSE, CAPILLARY
Glucose-Capillary: 124 mg/dL — ABNORMAL HIGH (ref 70–99)
Glucose-Capillary: 122 mg/dL — ABNORMAL HIGH (ref 70–99)
Glucose-Capillary: 134 mg/dL — ABNORMAL HIGH (ref 70–99)
Glucose-Capillary: 187 mg/dL — ABNORMAL HIGH (ref 70–99)

## 2021-07-16 MED ORDER — SIMVASTATIN 20 MG PO TABS
20.0000 mg | ORAL_TABLET | Freq: Every day | ORAL | Status: DC
  Administered 2021-07-16 – 2021-07-25 (×10): 20 mg via ORAL
  Filled 2021-07-16 (×10): qty 1

## 2021-07-16 MED ORDER — GUAIFENESIN-DM 100-10 MG/5ML PO SYRP: 5.0000 mL | ORAL_SOLUTION | Freq: Four times a day (QID) | ORAL | Status: DC | PRN

## 2021-07-16 MED ORDER — CARVEDILOL 12.5 MG PO TABS
25.0000 mg | ORAL_TABLET | Freq: Two times a day (BID) | ORAL | Status: DC
  Administered 2021-07-16 – 2021-07-26 (×17): 25 mg via ORAL
  Filled 2021-07-16 (×18): qty 2

## 2021-07-16 MED ORDER — MUSCLE RUB 10-15 % EX CREA
1.0000 "application " | TOPICAL_CREAM | CUTANEOUS | Status: DC | PRN
  Administered 2021-07-19: 1 via TOPICAL
  Filled 2021-07-16: qty 85

## 2021-07-16 MED ORDER — NIFEDIPINE ER OSMOTIC RELEASE 60 MG PO TB24
60.0000 mg | ORAL_TABLET | Freq: Every day | ORAL | Status: DC
  Administered 2021-07-16 – 2021-07-25 (×10): 60 mg via ORAL
  Filled 2021-07-16 (×10): qty 1

## 2021-07-16 MED ORDER — CARVEDILOL 25 MG PO TABS: 25.0000 mg | ORAL_TABLET | ORAL | Status: DC

## 2021-07-16 MED ORDER — NEPRO/CARBSTEADY PO LIQD
237.0000 mL | Freq: Two times a day (BID) | ORAL | Status: DC
Start: 2021-07-16 — End: 2021-07-26
  Administered 2021-07-16 – 2021-07-25 (×13): 237 mL via ORAL

## 2021-07-16 MED ORDER — NIFEDIPINE ER OSMOTIC RELEASE 30 MG PO TB24: 60.0000 mg | ORAL_TABLET | Freq: Every day | ORAL | Status: DC

## 2021-07-16 MED ORDER — BLOOD PRESSURE CONTROL BOOK
Freq: Once | Status: AC
  Filled 2021-07-16: qty 1

## 2021-07-16 MED ORDER — HEPARIN SODIUM (PORCINE) 5000 UNIT/ML IJ SOLN
5000.0000 [IU] | Freq: Three times a day (TID) | INTRAMUSCULAR | Status: DC
  Administered 2021-07-16 – 2021-07-26 (×27): 5000 [IU] via SUBCUTANEOUS
  Filled 2021-07-16 (×25): qty 1

## 2021-07-16 MED ORDER — DOCUSATE SODIUM 100 MG PO CAPS
100.0000 mg | ORAL_CAPSULE | Freq: Two times a day (BID) | ORAL | Status: AC
Start: 2021-07-16 — End: ?

## 2021-07-16 MED ORDER — OMEPRAZOLE 20 MG PO CPDR
40.0000 mg | DELAYED_RELEASE_CAPSULE | Freq: Every day | ORAL | Status: DC
Start: 2021-07-16 — End: 2021-07-26

## 2021-07-16 MED ORDER — ASPIRIN EC 325 MG PO TBEC
325.0000 mg | DELAYED_RELEASE_TABLET | Freq: Every day | ORAL | Status: DC
  Administered 2021-07-17 – 2021-07-26 (×10): 325 mg via ORAL
  Filled 2021-07-16 (×10): qty 1

## 2021-07-16 MED ORDER — ADVAIR HFA 115-21 MCG/ACT IN AERO: 1.0000 | INHALATION_SPRAY | Freq: Two times a day (BID) | RESPIRATORY_TRACT | Status: DC

## 2021-07-16 MED ORDER — SENNOSIDES-DOCUSATE SODIUM 8.6-50 MG PO TABS
2.0000 | ORAL_TABLET | Freq: Two times a day (BID) | ORAL | Status: DC
  Administered 2021-07-16 – 2021-07-26 (×19): 2 via ORAL
  Filled 2021-07-16 (×20): qty 2

## 2021-07-16 MED ORDER — RENA-VITE PO TABS
1.0000 | ORAL_TABLET | Freq: Every day | ORAL | Status: DC
  Administered 2021-07-16 – 2021-07-25 (×10): 1 via ORAL
  Filled 2021-07-16 (×10): qty 1

## 2021-07-16 MED ORDER — ASPIRIN 325 MG PO TBEC: 325.0000 mg | DELAYED_RELEASE_TABLET | Freq: Every day | ORAL | Status: DC

## 2021-07-16 MED ORDER — PROCHLORPERAZINE MALEATE 5 MG PO TABS: 5.0000 mg | ORAL_TABLET | Freq: Four times a day (QID) | ORAL | Status: DC | PRN

## 2021-07-16 MED ORDER — CLONIDINE HCL 0.1 MG PO TABS: 0.1000 mg | ORAL_TABLET | Freq: Two times a day (BID) | ORAL | Status: DC

## 2021-07-16 MED ORDER — MILK AND MOLASSES ENEMA
1.0000 | Freq: Every day | RECTAL | Status: DC | PRN
  Filled 2021-07-16: qty 240

## 2021-07-16 MED ORDER — SIMETHICONE 80 MG PO CHEW
80.0000 mg | CHEWABLE_TABLET | Freq: Four times a day (QID) | ORAL | Status: DC | PRN
  Administered 2021-07-24: 18:00:00 80 mg via ORAL
  Filled 2021-07-16: qty 1

## 2021-07-16 MED ORDER — MELATONIN 3 MG PO TABS
3.0000 mg | ORAL_TABLET | Freq: Every evening | ORAL | Status: DC | PRN
  Administered 2021-07-16 – 2021-07-21 (×2): 3 mg via ORAL
  Filled 2021-07-16 (×2): qty 1

## 2021-07-16 MED ORDER — PANTOPRAZOLE SODIUM 40 MG PO TBEC
40.0000 mg | DELAYED_RELEASE_TABLET | Freq: Every day | ORAL | Status: DC
  Administered 2021-07-16 – 2021-07-26 (×11): 40 mg via ORAL
  Filled 2021-07-16 (×11): qty 1

## 2021-07-16 MED ORDER — NEPRO/CARBSTEADY PO LIQD: 237.0000 mL | Freq: Two times a day (BID) | ORAL | Status: DC

## 2021-07-16 MED ORDER — RENA-VITE PO TABS: 1.0000 | ORAL_TABLET | Freq: Every day | ORAL | Status: DC

## 2021-07-16 MED ORDER — CARVEDILOL 12.5 MG PO TABS: 25.0000 mg | ORAL_TABLET | ORAL | Status: DC

## 2021-07-16 MED ORDER — CLONIDINE HCL 0.1 MG PO TABS
0.1000 mg | ORAL_TABLET | Freq: Two times a day (BID) | ORAL | Status: DC
  Administered 2021-07-16 – 2021-07-26 (×17): 0.1 mg via ORAL
  Filled 2021-07-16 (×20): qty 1

## 2021-07-16 MED ORDER — MOMETASONE FURO-FORMOTEROL FUM 200-5 MCG/ACT IN AERO
2.0000 | INHALATION_SPRAY | Freq: Two times a day (BID) | RESPIRATORY_TRACT | Status: DC
  Administered 2021-07-16 – 2021-07-25 (×18): 2 via RESPIRATORY_TRACT
  Filled 2021-07-16: qty 8.8

## 2021-07-16 MED ORDER — ACETAMINOPHEN 500 MG PO TABS: 1000.0000 mg | ORAL_TABLET | Freq: Three times a day (TID) | ORAL | Status: AC

## 2021-07-16 MED ORDER — CALCIUM CARBONATE ANTACID 500 MG PO CHEW
1.0000 | CHEWABLE_TABLET | Freq: Four times a day (QID) | ORAL | Status: DC | PRN
  Administered 2021-07-24: 18:00:00 200 mg via ORAL
  Filled 2021-07-16: qty 1

## 2021-07-16 MED ORDER — LIDOCAINE-PRILOCAINE 2.5-2.5 % EX CREA
1.0000 "application " | TOPICAL_CREAM | CUTANEOUS | Status: DC
  Administered 2021-07-17 – 2021-07-24 (×4): 1 via TOPICAL
  Filled 2021-07-16: qty 5

## 2021-07-16 MED ORDER — ALLOPURINOL 100 MG PO TABS
100.0000 mg | ORAL_TABLET | Freq: Every day | ORAL | Status: DC
  Administered 2021-07-17 – 2021-07-26 (×10): 100 mg via ORAL
  Filled 2021-07-16 (×10): qty 1

## 2021-07-16 MED ORDER — PROCHLORPERAZINE EDISYLATE 10 MG/2ML IJ SOLN: 5.0000 mg | Freq: Four times a day (QID) | INTRAMUSCULAR | Status: DC | PRN

## 2021-07-16 MED ORDER — ALBUTEROL SULFATE (2.5 MG/3ML) 0.083% IN NEBU: 2.5000 mg | INHALATION_SOLUTION | Freq: Four times a day (QID) | RESPIRATORY_TRACT | Status: DC | PRN

## 2021-07-16 MED ORDER — POLYETHYLENE GLYCOL 3350 17 G PO PACK: 17.0000 g | PACK | Freq: Every day | ORAL | Status: DC

## 2021-07-16 MED ORDER — CLOPIDOGREL BISULFATE 75 MG PO TABS
75.0000 mg | ORAL_TABLET | Freq: Every day | ORAL | Status: DC
  Administered 2021-07-17 – 2021-07-26 (×10): 75 mg via ORAL
  Filled 2021-07-16 (×10): qty 1

## 2021-07-16 MED ORDER — EYE WASH OPHTH SOLN
1.0000 [drp] | Freq: Every day | OPHTHALMIC | Status: DC | PRN
  Filled 2021-07-16: qty 118

## 2021-07-16 MED ORDER — CLOPIDOGREL BISULFATE 75 MG PO TABS: 75.0000 mg | ORAL_TABLET | Freq: Every day | ORAL | Status: DC

## 2021-07-16 MED ORDER — ACETAMINOPHEN 325 MG PO TABS
650.0000 mg | ORAL_TABLET | Freq: Three times a day (TID) | ORAL | Status: DC
  Administered 2021-07-16 – 2021-07-17 (×4): 650 mg via ORAL
  Filled 2021-07-16 (×4): qty 2

## 2021-07-16 MED ORDER — ROPINIROLE HCL 0.5 MG PO TABS
0.5000 mg | ORAL_TABLET | Freq: Every day | ORAL | Status: DC
  Administered 2021-07-16 – 2021-07-25 (×10): 0.5 mg via ORAL
  Filled 2021-07-16 (×10): qty 1

## 2021-07-16 MED ORDER — HYDROMORPHONE HCL 2 MG PO TABS: 2.0000 mg | ORAL_TABLET | ORAL | Status: DC | PRN

## 2021-07-16 MED ORDER — DIPHENHYDRAMINE HCL 12.5 MG/5ML PO ELIX: 12.5000 mg | ORAL_SOLUTION | Freq: Four times a day (QID) | ORAL | Status: DC | PRN

## 2021-07-16 MED ORDER — POLYETHYL GLYCOL-PROPYL GLYCOL 0.4-0.3 % OP GEL
Freq: Every day | OPHTHALMIC | Status: DC | PRN
  Filled 2021-07-16: qty 10

## 2021-07-16 MED ORDER — BISACODYL 10 MG RE SUPP
10.0000 mg | RECTAL | Status: DC
  Administered 2021-07-19 – 2021-07-23 (×3): 10 mg via RECTAL
  Filled 2021-07-16 (×6): qty 1

## 2021-07-16 MED ORDER — LIVING WELL WITH DIABETES BOOK
Freq: Once | Status: AC
  Filled 2021-07-16: qty 1

## 2021-07-16 MED ORDER — PROCHLORPERAZINE 25 MG RE SUPP: 12.5000 mg | Freq: Four times a day (QID) | RECTAL | Status: DC | PRN

## 2021-07-16 NOTE — Plan of Care (Signed)
Pt has been turned about every 2 hrs. Pt has sat on the side of the bed and went to eat lunch in the chair. Pt has pain to the right side only with movement but manages it with the pillow. PT recommend a chest binder. Going to CIR today.  Problem: Education: Goal: Knowledge of General Education information will improve Description: Including pain rating scale, medication(s)/side effects and non-pharmacologic comfort measures Outcome: Progressing   Problem: Health Behavior/Discharge Planning: Goal: Ability to manage health-related needs will improve Outcome: Progressing   Problem: Clinical Measurements: Goal: Ability to maintain clinical measurements within normal limits will improve Outcome: Progressing Goal: Will remain free from infection Outcome: Progressing Goal: Diagnostic test results will improve Outcome: Progressing Goal: Respiratory complications will improve Outcome: Progressing Goal: Cardiovascular complication will be avoided Outcome: Progressing   Problem: Activity: Goal: Risk for activity intolerance will decrease Outcome: Progressing   Problem: Nutrition: Goal: Adequate nutrition will be maintained Outcome: Progressing   Problem: Coping: Goal: Level of anxiety will decrease Outcome: Progressing   Problem: Elimination: Goal: Will not experience complications related to bowel motility Outcome: Progressing Goal: Will not experience complications related to urinary retention Outcome: Progressing   Problem: Pain Managment: Goal: General experience of comfort will improve Outcome: Progressing   Problem: Safety: Goal: Ability to remain free from injury will improve Outcome: Progressing   Problem: Skin Integrity: Goal: Risk for impaired skin integrity will decrease Outcome: Progressing   Problem: Education: Goal: Knowledge of disease and its progression will improve Outcome: Progressing Goal: Individualized Educational Video(s) Outcome: Progressing    Problem: Fluid Volume: Goal: Compliance with measures to maintain balanced fluid volume will improve Outcome: Progressing   Problem: Health Behavior/Discharge Planning: Goal: Ability to manage health-related needs will improve Outcome: Progressing   Problem: Nutritional: Goal: Ability to make healthy dietary choices will improve Outcome: Progressing   Problem: Clinical Measurements: Goal: Complications related to the disease process, condition or treatment will be avoided or minimized Outcome: Progressing   Problem: Education: Goal: Knowledge of disease or condition will improve Outcome: Progressing Goal: Knowledge of secondary prevention will improve (SELECT ALL) Outcome: Progressing Goal: Knowledge of patient specific risk factors will improve (INDIVIDUALIZE FOR PATIENT) Outcome: Progressing Goal: Individualized Educational Video(s) Outcome: Progressing   Problem: Education: Goal: Knowledge of disease or condition will improve Outcome: Progressing Goal: Knowledge of secondary prevention will improve (SELECT ALL) Outcome: Progressing Goal: Knowledge of patient specific risk factors will improve (INDIVIDUALIZE FOR PATIENT) Outcome: Progressing Goal: Individualized Educational Video(s) Outcome: Progressing   Problem: Health Behavior/Discharge Planning: Goal: Ability to manage health-related needs will improve Outcome: Progressing   Problem: Self-Care: Goal: Ability to participate in self-care as condition permits will improve Outcome: Progressing   Problem: Ischemic Stroke/TIA Tissue Perfusion: Goal: Complications of ischemic stroke/TIA will be minimized Outcome: Progressing

## 2021-07-16 NOTE — H&P (Signed)
Physical Medicine and Rehabilitation Admission H&P     CC: Functional deficits due to stroke   HPI: 87 year old female who was in her usual state of health on the morning of July 04, 2021 when she arose and was preparing to go to hemodialysis.  She developed shortness of breath and subsequent PEA arrest.  EMS arrived and she received 15 minutes of CPR, ROSC obtained. King airway was placed in the field and was exchanged for an endotracheal tube at [facility] emergency department.  Work-up included CTA of the chest which showed no PE, but multiple bilateral rib fractures.  Imaging revealed right-sided air-space disease suggesting pneumonia and she was started on Rocephin and azithromycin.  She was extubated the following day, 12/09 at 1115.    She exhibited delirium and sundowning on 12/11 and suspected narcotic side-effect. On 12/12, rapid response was called due to confusion and expressive aphasia.  No extremity weakness. CT of the head showed encephalomalacia from old anterior MCA territory stroke. MRI of the brain was performed showing multiple infarcts suspected to be due to micro emboli during resuscitation.  MRA of the head revealed moderate to severe narrowing in the left supraclinoid internal carotid artery.  There was decreased perfusion in the left distal MCA branches without focal MCA stenosis or occlusion.  Multifocal narrowing of the right vertebral artery, which appeared occluded just proximal to the vertebrobasilar junction.  She also underwent carotid ultrasound which revealed right ICA stenosis of 40 to 59%, left ICA stenosis 1 to 39%.  Neurology was consulted.  EP cardiology also consulted for placement of loop recorder.  She was deemed not an appropriate candidate for loop recorder and 30-day event monitor with outpatient follow-up recommended.  Nephrology was consulted for chronic intermittent hemodialysis management.  Labs revealed Na  148, K4.0.  Hgb 11.2, Troponin I = 330, BNP  1308. No antithrombotics prior to admission now on dual antiplatelet therapy with aspirin and Plavix. The patient requires inpatient medicine and rehabilitation evaluations and services for ongoing dysfunction secondary to acute encephalopathy   Echocardiogram revealed left ventricular action fraction estimated at 50 to 62%, grade 1 diastolic dysfunction. Bilateral lower extremity venous Doppler ultrasound revealed no DVT. EEG: 07/08/2021>mild diffuse encephalopathy, nonspecific etiology. No seizures or epileptiform discharges were seen throughout the recording. Hemoglobin A1c 5.7, LDL 73.   Past medical history significant for end-stage renal disease on chronic intermittent hemodialysis on Tuesday, Thursday and Saturday schedule at [facility] via left upper extremity AV graft. No known prior history of stroke. Prior hysterectomy.   Patient lives with daughter, Michaelle Copas in [place]. She moved from [place], [place] approximately one and half years ago.   Pt reports needs a suppository every other night for having BM's- needs regularly- after dinner.  Also c/o difficulty coming up with the "right words". And then it "slips her mind" since the stroke.      Review of Systems  Constitutional:  Negative for chills and fever.       Complains of constant cold intolerance  Eyes:  Negative for blurred vision and double vision.       She complains of "trouble with my eyes" particularly when turning head side to side. Denies monocular blindness.  Respiratory:  Negative for cough and hemoptysis.   Cardiovascular:  Negative for chest pain and leg swelling.  Gastrointestinal:  Negative for constipation, nausea and vomiting.  Musculoskeletal:        Rib pain with twisting torso  Skin:  Negative for itching and rash.  Neurological:  Negative for dizziness and headaches.  Psychiatric/Behavioral:  Positive for memory loss.   All other systems reviewed and are negative.     Past Medical  History:  Diagnosis Date   Chronic kidney disease     COPD (chronic obstructive pulmonary disease) ([place])     Diabetes mellitus without complication ([place])     Hyperlipidemia     Hypertension           Past Surgical History:  Procedure Laterality Date   ABDOMINAL HYSTERECTOMY       AV FISTULA PLACEMENT       IR REMOVAL TUN CV CATH W/O FL   09/07/2020      Social History:  reports that she has never smoked. She has never used smokeless tobacco. She reports that she does not use drugs. No history on file for alcohol use. Reports rare intake of occasional wine>>few sips   Family history: Mother deceased secondary to complications of childbirth Father deceased secondary to cancer (GI>>possibly liver) Sister: hypertension   Allergies:       Allergies  Allergen Reactions   Penicillins Rash   Prednisone Other (See Comments)      insomnia insomnia insomnia     Penicillins     Scallops [Shellfish Allergy]        Threw up blood   Shellfish Allergy            Medications Prior to Admission  Medication Sig Dispense Refill   albuterol (PROVENTIL) (2.5 MG/3ML) 0.083% nebulizer solution Take 2.5 mg by nebulization every 6 (six) hours as needed for wheezing or shortness of breath.        albuterol (VENTOLIN HFA) 108 (90 Base) MCG/ACT inhaler Inhale 2 puffs into the lungs every 4 (four) hours as needed for shortness of breath.       allopurinol (ZYLOPRIM) 100 MG tablet Take 100 mg by mouth daily.       AURYXIA 1 GM 210 MG(Fe) tablet Take 1 tablet by mouth 3 (three) times daily.       ibuprofen (ADVIL) 200 MG tablet Take 200 mg by mouth every 8 (eight) hours as needed for moderate pain.       lidocaine-prilocaine (EMLA) cream Apply 1 application topically 3 (three) times a week.       Menthol-Methyl Salicylate (MUSCLE RUB) 10-15 % CREA Apply 1 application topically as needed for muscle pain.       Multiple Vitamins-Minerals (MULTI FOR HER 50+) TABS Take 1 tablet by mouth daily.        Polyethyl Glycol-Propyl Glycol (SYSTANE OP) Place 1 drop into both eyes daily as needed (dry eyes).       rOPINIRole (REQUIP) 0.5 MG tablet Take 0.5 mg by mouth at bedtime.       Sennosides (SENNA) 8.6 MG CAPS Take 1 tablet by mouth daily.       simvastatin (ZOCOR) 20 MG tablet Take 20 mg by mouth at bedtime.       [DISCONTINUED] carvedilol (COREG) 25 MG tablet Take 12.5 mg by mouth See admin instructions. Take 12.5 mg twice daily on nondailysis days (Sun, Mon, Wed, and Fri)       [DISCONTINUED] NIFEdipine (PROCARDIA-XL/NIFEDICAL-XL) 30 MG 24 hr tablet Take 30 mg by mouth at bedtime.       [DISCONTINUED] omeprazole (PRILOSEC) 20 MG capsule Take 40 mg by mouth 4 (four) times a week. On Sun, Mon, Wed, and Fri (nondialysis days)       [  DISCONTINUED] fluticasone-salmeterol (ADVAIR HFA) 115-21 MCG/ACT inhaler Inhale 1 puff into the lungs daily as needed (shortness of breath).           Drug Regimen Review  Drug regimen was reviewed and remains appropriate with no significant issues identified   Home: Home Living Family/patient expects to be discharged to:: Private residence Living Arrangements: Children Available Help at Discharge: Family Type of Home: House Home Access: Stairs to enter Technical brewer of Steps: 1 Home Layout: Two level, Able to live on main level with bedroom/bathroom Bathroom Shower/Tub: Multimedia programmer: Standard Bathroom Accessibility: Yes Home Equipment: Conservation officer, nature (2 wheels), [employer] - single point, Hand held shower head, Grab bars - tub/shower, Shower seat  Lives With: Family   Functional History: Prior Function Prior Level of Function : Needs assist  Cognitive Assist : Mobility (cognitive), ADLs (cognitive) Mobility (Cognitive): Set up cues ADLs (Cognitive): Set up cues Mobility Comments: patient states she walks in/out of the home without an AD. ADLs Comments: patient states daughter assists her with IADL, community mobility, bill payment,  but patient able to care for her own self care... ? supervision given cognitive defict   Functional Status:  Mobility: Bed Mobility Overal bed mobility: Needs Assistance Bed Mobility: Rolling, Sidelying to Sit, Sit to Sidelying Rolling: Min assist Sidelying to sit: Min assist Supine to sit: Min assist Sit to supine: Min assist Sit to sidelying: Min assist General bed mobility comments: pt hugging heart pillow, min A for trunk elevation/mod cues for technique and splinting Transfers Overall transfer level: Needs assistance Equipment used: Rolling walker (2 wheels) Transfers: Sit to/from Stand Sit to Stand: Min assist Bed to/from chair/wheelchair/BSC transfer type:: Stand pivot Step pivot transfers: Min assist General transfer comment: from EOB to RW x3 trials, pt needs reminders for safe hand placement and unable to achieve upright with only min guard, needs minA lift assist Ambulation/Gait Ambulation/Gait assistance: Min assist Gait Distance (Feet): 125 Feet (40ft, seated break, 186ft) Assistive device: Rolling walker (2 wheels) Gait Pattern/deviations: Step-through pattern, Decreased stride length, Shuffle, Narrow base of support, Drifts right/left General Gait Details: Pt drifting to L side of hallway and poor awareness of obstacles in room/on Rt side, needs manual assist at times for RW proximity/mgmt and cues to widen BOS but no overt scissoring this date Gait velocity: decreased Gait velocity interpretation: <1.31 ft/sec, indicative of household ambulator   ADL: ADL Overall ADL's : Needs assistance/impaired Eating/Feeding: Set up Eating/Feeding Details (indicate cue type and reason): sittiing in recliner Grooming: Min guard, Standing Grooming Details (indicate cue type and reason): EOB to wash face Lower Body Bathing: Minimal assistance, Sit to/from stand Lower Body Dressing: Moderate assistance Lower Body Dressing Details (indicate cue type and reason): min A sit<>stand;  pain limiting factor in ADLs Toilet Transfer: Min guard, Ambulation, Rolling walker (2 wheels) Toileting- Clothing Manipulation and Hygiene: Min guard, Sit to/from stand Toileting - Clothing Manipulation Details (indicate cue type and reason): in standing Functional mobility during ADLs: Min guard, Rolling walker (2 wheels) General ADL Comments: pt expressed appreciation for therapy, ambulation, and bathing. Pt demonstrated functional imrpovements this session. Benefits from verbal cues for safety and sequencing   Cognition: Cognition Overall Cognitive Status: Impaired/Different from baseline Arousal/Alertness: Awake/alert Orientation Level: Oriented X4 Attention: Sustained Sustained Attention: Appears intact Memory: Impaired Memory Impairment: Storage deficit, Retrieval deficit, Decreased recall of new information Awareness: Impaired Awareness Impairment: Emergent impairment Safety/Judgment: Impaired Cognition Arousal/Alertness: Awake/alert Behavior During Therapy: WFL for tasks assessed/performed Overall Cognitive Status: Impaired/Different from  baseline Area of Impairment: Attention, Following commands, Safety/judgement, Awareness, Problem solving Orientation Level:  (oriented x4 today) Current Attention Level: Sustained Memory: Decreased recall of precautions Following Commands: Follows one step commands with increased time Safety/Judgement: Decreased awareness of safety, Decreased awareness of deficits Awareness: Emergent Problem Solving: Requires verbal cues, Slow processing General Comments: step by step sequencing cues   Physical Exam: Blood pressure (!) 172/62, pulse 74, temperature 98 F (36.7 C), temperature source Oral, resp. rate 19, height 5\' 9"  (1.753 m), weight 63.6 kg, SpO2 100 %. Physical Exam Vitals and nursing note reviewed.  Constitutional:      General: She is not in acute distress.    Comments: Sitting up in bedside chair; appropriate- appears younger  than stated age; wearing full dentures, NAD; c/o poor memory and lack of knowledge about PEA arrest.   HENT:     Head: Normocephalic and atraumatic.     Comments: Tongue midline; smile equal    Right Ear: External ear normal.     Left Ear: External ear normal.     Nose: Nose normal. No congestion.     Mouth/Throat:     Mouth: Mucous membranes are dry.     Pharynx: Oropharynx is clear. No oropharyngeal exudate.     Comments: Wearing full dentures Eyes:     General:        Right eye: No discharge.        Left eye: No discharge.     Extraocular Movements: Extraocular movements intact.  Neck:     Vascular: No carotid bruit.  Cardiovascular:     Rate and Rhythm: Normal rate and regular rhythm.     Heart sounds: Normal heart sounds. No murmur heard.   No gallop.     Comments: RRR- no JVD Pulmonary:     Effort: Pulmonary effort is normal.     Breath sounds: No wheezing or rales.     Comments: CTA B/L- no W/R/R- good air movement   Abdominal:     General: Abdomen is flat. Bowel sounds are normal.     Tenderness: There is no abdominal tenderness.     Comments: Soft, NT,  ND, (+) normoactive BS  Musculoskeletal:     Cervical back: Normal range of motion and neck supple.     Comments: No lower extremity pain. Endorses restless legs>>well-controlled with Requip RUE 5-/5; LUE 5/5 RLE_ 5-/5; LLE 5/5  Skin:    General: Skin is warm and dry.     Comments: Heels slightly boggy B/L  IV R upper arms looks good Thrill LUE from fistula  Neurological:     Mental Status: She is alert and oriented to person, place, and time.     Comments: Makes eye contact with examiner. Follows commands. Face symmetric, tongue midline. Intact to light touch in all 4 extremities Naming 3/3; but appears to have poor memory; mild aphasia?  Psychiatric:        Mood and Affect: Mood normal.        Behavior: Behavior normal.      Lab Results Last 48 Hours        Results for orders placed or performed during  the hospital encounter of 07/04/21 (from the past 48 hour(s))  Glucose, capillary     Status: Abnormal    Collection Time: 07/14/21 12:21 PM  Result Value Ref Range    Glucose-Capillary 266 (H) 70 - 99 mg/dL      Comment: Glucose reference range applies only to samples  taken after fasting for at least 8 hours.  Glucose, capillary     Status: Abnormal    Collection Time: 07/14/21  4:03 PM  Result Value Ref Range    Glucose-Capillary 67 (L) 70 - 99 mg/dL      Comment: Glucose reference range applies only to samples taken after fasting for at least 8 hours.  Glucose, capillary     Status: Abnormal    Collection Time: 07/14/21  5:17 PM  Result Value Ref Range    Glucose-Capillary 143 (H) 70 - 99 mg/dL      Comment: Glucose reference range applies only to samples taken after fasting for at least 8 hours.  Glucose, capillary     Status: Abnormal    Collection Time: 07/14/21 10:24 PM  Result Value Ref Range    Glucose-Capillary 186 (H) 70 - 99 mg/dL      Comment: Glucose reference range applies only to samples taken after fasting for at least 8 hours.  Glucose, capillary     Status: Abnormal    Collection Time: 07/15/21  5:55 AM  Result Value Ref Range    Glucose-Capillary 110 (H) 70 - 99 mg/dL      Comment: Glucose reference range applies only to samples taken after fasting for at least 8 hours.  Glucose, capillary     Status: Abnormal    Collection Time: 07/15/21 12:49 PM  Result Value Ref Range    Glucose-Capillary 141 (H) 70 - 99 mg/dL      Comment: Glucose reference range applies only to samples taken after fasting for at least 8 hours.  Glucose, capillary     Status: Abnormal    Collection Time: 07/15/21  5:25 PM  Result Value Ref Range    Glucose-Capillary 121 (H) 70 - 99 mg/dL      Comment: Glucose reference range applies only to samples taken after fasting for at least 8 hours.  Glucose, capillary     Status: Abnormal    Collection Time: 07/15/21  9:17 PM  Result Value Ref  Range    Glucose-Capillary 145 (H) 70 - 99 mg/dL      Comment: Glucose reference range applies only to samples taken after fasting for at least 8 hours.  Renal function panel     Status: Abnormal    Collection Time: 07/16/21  3:13 AM  Result Value Ref Range    Sodium 137 135 - 145 mmol/L    Potassium 4.7 3.5 - 5.1 mmol/L    Chloride 99 98 - 111 mmol/L    CO2 24 22 - 32 mmol/L    Glucose, Bld 115 (H) 70 - 99 mg/dL      Comment: Glucose reference range applies only to samples taken after fasting for at least 8 hours.    BUN 54 (H) 8 - 23 mg/dL    Creatinine, Ser 7.50 (H) 0.44 - 1.00 mg/dL    Calcium 8.7 (L) 8.9 - 10.3 mg/dL    Phosphorus 6.1 (H) 2.5 - 4.6 mg/dL    Albumin 2.7 (L) 3.5 - 5.0 g/dL    GFR, Estimated 5 (L) >60 mL/min      Comment: (NOTE) Calculated using the CKD-EPI Creatinine Equation (2021)      Anion gap 14 5 - 15      Comment: Performed at [facility] [address] [place], [place] [address]  CBC     Status: Abnormal    Collection Time: 07/16/21  3:13 AM  Result  Value Ref Range    WBC 8.6 4.0 - 10.5 K/uL    RBC 3.18 (L) 3.87 - 5.11 MIL/uL    Hemoglobin 9.1 (L) 12.0 - 15.0 g/dL    HCT 28.8 (L) 36.0 - 46.0 %    MCV 90.6 80.0 - 100.0 fL    MCH 28.6 26.0 - 34.0 pg    MCHC 31.6 30.0 - 36.0 g/dL    RDW 17.2 (H) 11.5 - 15.5 %    Platelets 260 150 - 400 K/uL    nRBC 0.0 0.0 - 0.2 %      Comment: Performed at [facility] [address] [place], [place] [address]  Glucose, capillary     Status: Abnormal    Collection Time: 07/16/21  6:39 AM  Result Value Ref Range    Glucose-Capillary 124 (H) 70 - 99 mg/dL      Comment: Glucose reference range applies only to samples taken after fasting for at least 8 hours.      Imaging Results (Last 48 hours)  No results found.           Medical Problem List and Plan: 1. Functional deficits secondary to anterior left MCA stroke/encephalomalacia further defined by MRI as multiple microembolic infarcts in  that distribution; metabolic encephalopathy             -patient may  shower             -ELOS/Goals: 6-9 days supervision to mod I 2.  Antithrombotics: -DVT/anticoagulation:  Pharmaceutical: Heparin             -antiplatelet therapy: DAPT>>Plavix, aspirin 235 mg daily for 3 months then aspirin alone 3. Pain Management: Tylenol, Dilaudid- has rib pain- might need lidoderm patches? 4. Mood: LCSW to assess and provide support             -antipsychotic agents: n/a 5. Neuropsych: This patient is capable of making decisions on her own behalf. Consider neuropsych referral  6. Skin/Wound Care: routine skin checks 7. Fluids/Electrolytes/Nutrition: Is and Os and follow-up chemistries.  --Chronic iHD. T/T/S --Tolerating diet. 8. ESRD: TTS schedule  --[facility] 9. Acute on chronic hypoxic respiratory failure/pneumonia: completed ceftriaxone on 12/15.  --Currently normal SaO2 on RA 10: Anemia: [place]  and improving -- Aranesp per nephrology 11: Hypertension: Systolic BP averaging 149-702O. Continue coreg, nifedipine, clonidine 12: Gout: no flare, continue allopurinol 13: GERD: continue Protonix 14: DM2: Hgb A1c = 5.7%.  --Continue CBGs checks, SSI 15: Grade 1 diastolic dysfunction: monitor  16: Bilateral carotid stenosis: will need annual carotid duplex follow-up 17: Hyperlipidemia: continue simvastatin 18: Rib fractures: aggressive pulmonary hygiene, encourage deep cough; IS.  --Consider outpatient PFTs/pulmonary follow-up 19. Restless leg syndrome: continue Requip 20 Chronic constipation- will schedule Suppository every other day after dinner.      I have personally performed a face to face diagnostic evaluation of this patient and formulated the key components of the plan.  Additionally, I have personally reviewed laboratory data, imaging studies, as well as relevant notes and concur with the physician assistant's documentation above.   The patient's status has not  changed from the original H&P.  Any changes in documentation from the acute care chart have been noted above.       Barbie Banner, PA-C 07/16/2021

## 2021-07-16 NOTE — Progress Notes (Signed)
SLP Cancellation Note  Patient Details Name: Stacey Dorsey MRN: [id number] DOB: 12/20/1932   Cancelled treatment:       Reason Eval/Treat Not Completed: Other (comment) Pt currently working with other provider. Also note plans to d/c to CIR. Will f/u as able, but would recommend ongoing SLP f/u at next level of care.    Osie Bond., M.A. [facility] Pager [phone] Office [phone]  07/16/2021, 12:35 PM

## 2021-07-16 NOTE — Progress Notes (Signed)
Physical Therapy Treatment Patient Details Name: Stacey Dorsey MRN: [id number] DOB: 1933/04/03 Today's Date: 07/16/2021   History of Present Illness Pt is an 87 y/o female presenting 12/8 after cardiac arrest requiring 15 min CPR prior to ROSC. Intubated prior to arrival, extubated 12/9. On 12/11, pt with new onset word-finding, rapid response called and neurology consulted. 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 PMHx includes: ESRD on HD, poorly controlled HTN, DM, HLD, (presumed) COPD.    PT Comments    Pt received in supine, agreeable to therapy session with encouragement and with good participation and tolerance for gait/transfer training. Pt needing increased time/cues to perform functional mobility tasks and up to minA physical assist. Pt reliant on RW support but improved foot placement during gait. Pt with some decreased short term memory, unable to recall room # although it was discussed in previous session. Pt reports 6/10 modified RPE (fatigue) at end of session. Pt continues to benefit from PT services to progress toward functional mobility goals.   Recommendations for follow up therapy are one component of a multi-disciplinary discharge planning process, led by the attending physician.  Recommendations may be updated based on patient status, additional functional criteria and insurance authorization.  Follow Up Recommendations  Acute inpatient rehab (3hours/day)     Assistance Recommended at Discharge Frequent or constant Supervision/Assistance  Equipment Recommendations  BSC/3in1;Rolling walker (2 wheels)    Recommendations for Other Services Rehab consult     Precautions / Restrictions Precautions Precautions: Fall Precaution Comments: rib fxs (R4, 6-8 and L 6-9th) Required Braces or Orthoses: Other  Brace Other Brace: asked RN if she could order a chest binder for pt for comfort due to pain with rib fxs during transfers and bed mobility Restrictions Weight Bearing Restrictions: No     Mobility  Bed Mobility Overal bed mobility: Needs Assistance Bed Mobility: Rolling;Sidelying to Sit;Sit to Sidelying Rolling: Min assist Sidelying to sit: Min assist     Sit to sidelying: Min assist General bed mobility comments: pt hugging heart pillow, min A for trunk elevation/mod cues for technique and splinting    Transfers Overall transfer level: Needs assistance Equipment used: Rolling walker (2 wheels) Transfers: Sit to/from Stand Sit to Stand: Min assist;Min guard           General transfer comment: from EOB to RW with min guard, pt needs reminders for safe hand placement, needs minA lift assist from lower toilet height and wall railing support.    Ambulation/Gait Ambulation/Gait assistance: Min assist;Min guard Gait Distance (Feet): 60 Feet (75ft, seated break, 133ft) Assistive device: Rolling walker (2 wheels) Gait Pattern/deviations: Step-through pattern;Decreased stride length;Shuffle;Narrow base of support;Drifts right/left Gait velocity: decreased     General Gait Details: Pt drifting to L side of hallway and poor awareness of obstacles in room/on Rt side, needs manual assist at times for RW proximity/mgmt and cues to widen BOS but no overt scissoring this date   Stairs             Wheelchair Mobility    Modified Rankin (Stroke Patients Only) Modified Rankin (Stroke Patients Only) Pre-Morbid Rankin Score: No symptoms Modified Rankin: Moderately severe disability     Balance Overall balance assessment: Needs assistance Sitting-balance support: No upper extremity supported;Feet supported Sitting balance-Leahy Scale: Fair     Standing balance support: During functional activity;Single extremity supported Standing balance-Leahy Scale:  Poor Standing  balance comment: pt able to don mask in stance with min guard, needs BUE support for dynamic standing tasks                            Cognition Arousal/Alertness: Awake/alert Behavior During Therapy: WFL for tasks assessed/performed Overall Cognitive Status: Impaired/Different from baseline Area of Impairment: Attention;Following commands;Safety/judgement;Awareness;Problem solving                   Current Attention Level: Sustained Memory: Decreased recall of precautions Following Commands: Follows one step commands with increased time Safety/Judgement: Decreased awareness of safety;Decreased awareness of deficits Awareness: Emergent Problem Solving: Requires verbal cues;Slow processing General Comments: step by step sequencing cues for transfers        Exercises      General Comments General comments (skin integrity, edema, etc.): HR 70's bpm with exertion      Pertinent Vitals/Pain Pain Assessment: Faces Faces Pain Scale: Hurts little more Pain Location: ribs/chest with bed mobility and transfers Pain Descriptors / Indicators: Sharp;Sore ("I can hear it creaking") Pain Intervention(s): Limited activity within patient's tolerance;Monitored during session;Repositioned;RN gave pain meds during session    Home Living                          Prior Function            PT Goals (current goals can now be found in the care plan section) Acute Rehab PT Goals Patient Stated Goal: to return home PT Goal Formulation: With patient Time For Goal Achievement: 07/20/21 Progress towards PT goals: Progressing toward goals    Frequency    Min 4X/week      PT Plan Current plan remains appropriate;Frequency needs to be updated    Co-evaluation              AM-PAC PT "6 Clicks" Mobility   Outcome Measure  Help needed turning from your back to your side while in a flat bed without using bedrails?: A Little Help needed moving from lying on  your back to sitting on the side of a flat bed without using bedrails?: A Lot (mod cues) Help needed moving to and from a bed to a chair (including a wheelchair)?: A Little Help needed standing up from a chair using your arms (e.g., wheelchair or bedside chair)?: A Lot (mod cues) Help needed to walk in hospital room?: A Little Help needed climbing 3-5 steps with a railing? : Total 6 Click Score: 14    End of Session Equipment Utilized During Treatment: Gait belt Activity Tolerance: Patient tolerated treatment well Patient left: with call bell/phone within reach;in chair;with chair alarm set;Other (comment) (set up to eat lunch) Nurse Communication: Mobility status PT Visit Diagnosis: Unsteadiness on feet (R26.81);Difficulty in walking, not elsewhere classified (R26.2)     Time: 4034-7425 PT Time Calculation (min) (ACUTE ONLY): 28 min  Charges:  $Gait Training: 8-22 mins $Therapeutic Activity: 8-22 mins                     Anyely Cunning P., PTA Acute Rehabilitation Services Pager: [phone] Office: [place] 07/16/2021, 1:24 PM

## 2021-07-16 NOTE — Progress Notes (Signed)
PMR Admission Coordinator Pre-Admission Assessment   Patient: Stacey Dorsey is an 87 y.o., female MRN: [id number] DOB: 04/18/1933 Height: 5' 9"  (175.3 cm) Weight: 63.6 kg   Insurance Information HMO: yes    PPO:      PCP:      IPA:      80/20:      OTHER:  PRIMARY: U[employer] Medicare      Policy#: [id number]      Subscriber: pt CM Name: Pierce Crane      Phone#: [phone]     Fax#: [phone] Pre-Cert#: [id number] auth for CIR from [place] [facility] [place] with updates due to fax listed above on 12/28      Employer:  Benefits:  Phone #: [phone]     Name:  Eff. Date: 03/28/21     Deduct: $0      Out of Pocket Max: [phone] (met [phone])      Life Max: n/a CIR: $430/day for days 1-4      SNF: 20 full days Outpatient:      Co-Pay: $30/visit Home Health: 100%      Co-Pay:  DME: 80%     Co-Ins: 20% Providers:  SECONDARY:       Policy#:      Phone#:    Development worker, community:       Phone#:    The Actuary for patients in Inpatient Rehabilitation Facilities with attached Privacy Act [place] Records was provided and verbally reviewed with: Patient and Family   Emergency Contact Information Contact Information       Name Relation Home Work Mobile    hargrove,cheryl Daughter     [phone]    white,safarah Granddaughter     [phone]           Current Medical History  Patient Admitting Diagnosis: CVA 2/2 cardiac arrest   History of Present Illness: Pt is an 87 y/o female with PMH of ESRD (HD TRS), COPD, DM, and HTN admitted to [facility] on 12/8 with PEA arrest.  EMS provided 15 minutes of CPR for ROSC.  King airway was placed in the field and replaced with ETT in the ED.  Workup included CTA chest which showed no PE but multiple bilateral rib fractures.  Further imaging revealed right-sided airspace disease, suggestive of PNA.  She was started on Rocephin and azithromycin and extubated on 12/9.  Admission labs NA 148, K 4.0, Troponin I 330, and  BNP 1308.  Developed some delirium and ?sundowning on 12/11.  Rapid response called on 12/12 for confusion and expressive aphasia.  CT showed encephalomalacia, MRI showed multiple infarcts in the L MCA territory suspect from emboli during resuscitation.  MRA revealed moderate to severe narrowing in the L supraclinoid internal carotid and decreased perfusion in the L distal MCA branches.  She underwent carotid ultrasound which revealed R ICA stenosis of 40-59%, and L ICA stenosis 1-39%.  Neurology and EP cardiology consulted for placement of loop but she was deemed not a candidate and recommended for 30-day event monitor and outpatient f/u.  Now on DAPT with aspirin and plavix.  Echo revealed L ventricular EF of 50-55% and grade 1 diastolic dysfunction.  BLE dopplers no DVT.  EEG showed mild encephalopathy.  Therapy ongoing and pt was recommended for CIR.     Complete NIHSS TOTAL: 0   Patient's medical record from [facility] has been reviewed by the rehabilitation admission coordinator and physician.   Past Medical History  Past Medical History:  Diagnosis Date   Chronic kidney disease     COPD (chronic obstructive pulmonary disease) ([place])     Diabetes mellitus without complication ([place])     Hyperlipidemia     Hypertension        Has the patient had major surgery during 100 days prior to admission? No   Family History   family history is not on file.   Current Medications   Current Facility-Administered Medications:     stroke: mapping our early stages of recovery book, , Does not apply, Once, Amie Portland, MD   0.9 %  sodium chloride infusion, , Intravenous, PRN, Collene Gobble, MD, Stopped at 07/05/21 1832   acetaminophen (TYLENOL) tablet 1,000 mg, 1,000 mg, Oral, TID, Hongalgi, Anand D, MD, 1,000 mg at 07/16/21 1138   allopurinol (ZYLOPRIM) tablet 100 mg, 100 mg, Oral, Daily, Collene Gobble, MD, 100 mg at 07/16/21 1139   aspirin EC tablet 325 mg, 325 mg, Oral, Daily, Rosalin Hawking, MD, 325 mg at 07/16/21 1139   bisacodyl (DULCOLAX) suppository 10 mg, 10 mg, Rectal, Daily PRN, Collene Gobble, MD, 10 mg at 07/12/21 1554   carvedilol (COREG) tablet 25 mg, 25 mg, Oral, 2 times per day on Sun Mon Wed Fri, Byrum, Robert S, MD, 25 mg at 07/15/21 2300   Chlorhexidine Gluconate Cloth 2 % PADS 6 each, 6 each, Topical, Daily, Collene Gobble, MD, 6 each at 07/16/21 1141   cloNIDine (CATAPRES) tablet 0.1 mg, 0.1 mg, Oral, BID, Collene Gobble, MD, 0.1 mg at 07/16/21 1137   clopidogrel (PLAVIX) tablet 75 mg, 75 mg, Oral, Daily, Vance Gather B, MD, 75 mg at 07/16/21 1139   Darbepoetin Alfa (ARANESP) injection 60 mcg, 60 mcg, Intravenous, Q Thu-HD, Roney Jaffe, MD, 60 mcg at 07/11/21 1503   docusate sodium (COLACE) capsule 100 mg, 100 mg, Oral, BID, Collene Gobble, MD, 100 mg at 07/16/21 1142   docusate sodium (COLACE) capsule 100 mg, 100 mg, Oral, BID PRN, Collene Gobble, MD, 100 mg at 07/16/21 1138   feeding supplement (NEPRO CARB STEADY) liquid 237 mL, 237 mL, Oral, BID BM, Collene Gobble, MD, 237 mL at 07/16/21 1143   heparin injection 5,000 Units, 5,000 Units, Subcutaneous, Q8H, Collene Gobble, MD, 5,000 Units at 07/16/21 0620   hydrALAZINE (APRESOLINE) injection 10 mg, 10 mg, Intravenous, Q6H PRN, Collene Gobble, MD, 10 mg at 07/11/21 1718   HYDROmorphone (DILAUDID) tablet 1 mg, 1 mg, Oral, Q4H PRN, Vance Gather B, MD, 1 mg at 07/11/21 1410   HYDROmorphone (DILAUDID) tablet 2 mg, 2 mg, Oral, Q4H PRN, Vance Gather B, MD, 2 mg at 07/12/21 2226   ibuprofen (ADVIL) tablet 600 mg, 600 mg, Oral, Q6H PRN, Collene Gobble, MD, 600 mg at 07/14/21 1434   ipratropium-albuterol (DUONEB) 0.5-2.5 (3) MG/3ML nebulizer solution 3 mL, 3 mL, Nebulization, Q4H PRN, Collene Gobble, MD, 3 mL at 07/07/21 1017   labetalol (NORMODYNE) injection 10 mg, 10 mg, Intravenous, Q4H PRN, Collene Gobble, MD, 10 mg at 07/09/21 1221   melatonin tablet 3 mg, 3 mg, Oral, QHS PRN, Collene Gobble, MD, 3  mg at 07/12/21 2226   multivitamin (RENA-VIT) tablet 1 tablet, 1 tablet, Oral, QHS, Byrum, Rose Fillers, MD, 1 tablet at 07/15/21 2246   NIFEdipine (PROCARDIA XL/NIFEDICAL XL) 24 hr tablet 60 mg, 60 mg, Oral, QHS, Roney Jaffe, MD, 60 mg at 07/15/21 2245   ondansetron (ZOFRAN) injection 4 mg,  4 mg, Intravenous, Q6H PRN, Byrum, Rose Fillers, MD   pantoprazole (PROTONIX) EC tablet 40 mg, 40 mg, Oral, QHS, Byrum, Rose Fillers, MD, 40 mg at 07/15/21 2243   phenol (CHLORASEPTIC) mouth spray 1 spray, 1 spray, Mouth/Throat, PRN, Collene Gobble, MD, 1 spray at 07/05/21 1408   polyethylene glycol (MIRALAX / GLYCOLAX) packet 17 g, 17 g, Per Tube, Daily PRN, Byrum, Rose Fillers, MD   polyethylene glycol (MIRALAX / GLYCOLAX) packet 17 g, 17 g, Oral, Daily, Hongalgi, Anand D, MD, 17 g at 07/15/21 1036   rOPINIRole (REQUIP) tablet 0.5 mg, 0.5 mg, Oral, QHS, Byrum, Rose Fillers, MD, 0.5 mg at 07/15/21 2244   senna (SENOKOT) tablet 17.2 mg, 2 tablet, Oral, Daily, Hongalgi, Anand D, MD, 17.2 mg at 07/16/21 1137   simvastatin (ZOCOR) tablet 20 mg, 20 mg, Oral, QHS, Byrum, Rose Fillers, MD, 20 mg at 07/15/21 2244   Patients Current Diet:  Diet Order                  Diet - low sodium heart healthy             Diet renal with fluid restriction Fluid restriction: 1200 mL Fluid; Room service appropriate? Yes with Assist; Fluid consistency: Thin  Diet effective now                         Precautions / Restrictions Precautions Precautions: Fall Precaution Comments: rib fxs (R4, 6-8 and L 6-9th) Restrictions Weight Bearing Restrictions: No    Has the patient had 2 or more falls or a fall with injury in the past year? Yes   Prior Activity Level Limited Community (1-2x/wk): daughter assisting with IADLs, but pt reports mod I with ADLs and mobility using RW, not driving   Prior Functional Level Self Care: Did the patient need help bathing, dressing, using the toilet or eating? Independent   Indoor Mobility: Did the  patient need assistance with walking from room to room (with or without device)? Independent   Stairs: Did the patient need assistance with internal or external stairs (with or without device)? Needed some help   Functional Cognition: Did the patient need help planning regular tasks such as shopping or remembering to take medications? Needed some help   Patient Information Are you of Hispanic, Latino/a,or Spanish origin?: A. No, not of Hispanic, Latino/a, or Spanish origin What is your race?: B. Black or African American Do you need or want an interpreter to communicate with a doctor or health care staff?: 0. No   Patient's Response To:  Health Literacy and Transportation Is the patient able to respond to health literacy and transportation needs?: Yes Health Literacy - How often do you need to have someone help you when you read instructions, pamphlets, or other written material from your doctor or pharmacy?: Never In the past 12 months, has lack of transportation kept you from medical appointments or from getting medications?: No In the past 12 months, has lack of transportation kept you from meetings, work, or from getting things needed for daily living?: No   Home Assistive Devices / [place] Devices/Equipment: Radio producer (specify quad or straight), Walker (specify type) Home Equipment: Conservation officer, nature (2 wheels), [employer] - single point, Hand held shower head, Grab bars - tub/shower, Shower seat   Prior Device Use: Indicate devices/aids used by the patient prior to current illness, exacerbation or injury? Walker   Current Functional Level Cognition   Arousal/Alertness:  Awake/alert Overall Cognitive Status: Impaired/Different from baseline Current Attention Level: Sustained Orientation Level: Oriented X4 Following Commands: Follows one step commands with increased time Safety/Judgement: Decreased awareness of safety, Decreased awareness of deficits General Comments: step by  step sequencing cues Attention: Sustained Sustained Attention: Appears intact Memory: Impaired Memory Impairment: Storage deficit, Retrieval deficit, Decreased recall of new information Awareness: Impaired Awareness Impairment: Emergent impairment Safety/Judgment: Impaired    Extremity Assessment (includes Sensation/Coordination)   Upper Extremity Assessment: Generalized weakness LUE Deficits / Details: dialysis side, patient with decreased strength and AROM as well as decreased coordination LUE Sensation: decreased light touch LUE Coordination: decreased fine motor  Lower Extremity Assessment: Defer to PT evaluation     ADLs   Overall ADL's : Needs assistance/impaired Eating/Feeding: Set up Eating/Feeding Details (indicate cue type and reason): sittiing in recliner Grooming: Min guard, Standing Grooming Details (indicate cue type and reason): EOB to wash face Lower Body Bathing: Minimal assistance, Sit to/from stand Lower Body Dressing: Moderate assistance Lower Body Dressing Details (indicate cue type and reason): min A sit<>stand; pain limiting factor in ADLs Toilet Transfer: Min guard, Ambulation, Rolling walker (2 wheels) Toileting- Clothing Manipulation and Hygiene: Min guard, Sit to/from stand Toileting - Clothing Manipulation Details (indicate cue type and reason): in standing Functional mobility during ADLs: Min guard, Rolling walker (2 wheels) General ADL Comments: pt expressed appreciation for therapy, ambulation, and bathing. Pt demonstrated functional imrpovements this session. Benefits from verbal cues for safety and sequencing     Mobility   Overal bed mobility: Needs Assistance Bed Mobility: Rolling, Sidelying to Sit, Sit to Sidelying Rolling: Min assist Sidelying to sit: Min assist Supine to sit: Min assist Sit to supine: Min assist Sit to sidelying: Min assist General bed mobility comments: pt hugging heart pillow, min A for trunk elevation/mod cues for  technique and splinting     Transfers   Overall transfer level: Needs assistance Equipment used: Rolling walker (2 wheels) Transfers: Sit to/from Stand Sit to Stand: Min assist Bed to/from chair/wheelchair/BSC transfer type:: Stand pivot Step pivot transfers: Min assist General transfer comment: from EOB to RW x3 trials, pt needs reminders for safe hand placement and unable to achieve upright with only min guard, needs minA lift assist     Ambulation / Gait / Stairs / Wheelchair Mobility   Ambulation/Gait Ambulation/Gait assistance: Herbalist (Feet): 125 Feet (16ft, seated break, 142ft) Assistive device: Rolling walker (2 wheels) Gait Pattern/deviations: Step-through pattern, Decreased stride length, Shuffle, Narrow base of support, Drifts right/left General Gait Details: Pt drifting to L side of hallway and poor awareness of obstacles in room/on Rt side, needs manual assist at times for RW proximity/mgmt and cues to widen BOS but no overt scissoring this date Gait velocity: decreased Gait velocity interpretation: <1.31 ft/sec, indicative of household ambulator     Posture / Balance Dynamic Sitting Balance Sitting balance - Comments: cues for postural awareness initially, needs UE support Balance Overall balance assessment: Needs assistance Sitting-balance support: No upper extremity supported, Feet supported Sitting balance-Leahy Scale: Fair Sitting balance - Comments: cues for postural awareness initially, needs UE support Standing balance support: During functional activity, Single extremity supported Standing balance-Leahy Scale: Poor Standing balance comment: static standing single UE support, needs BUE support for dynamic tasks     Special needs/care consideration Dialysis: Hemodialysis Tuesday, Thursday, and Saturday and Diabetic management yes    Previous Home Environment (from acute therapy documentation) Living Arrangements: Children  Lives With:  Family Available Help at Discharge:  Family Type of Home: House Home Layout: Two level, Able to live on main level with bedroom/bathroom Home Access: Stairs to enter Entrance Stairs-Number of Steps: 1 Bathroom Shower/Tub: Multimedia programmer: Standard Bathroom Accessibility: Yes How Accessible: Accessible via walker [place]: No   Discharge Living Setting Plans for Discharge Living Setting: Lives with (comment) (daughter) Type of Home at Discharge: House Discharge Home Layout: Able to live on main level with bedroom/bathroom Discharge Home Access: Stairs to enter Entrance Stairs-Rails: None Entrance Stairs-Number of Steps: 1 Discharge Bathroom Shower/Tub: Walk-in shower Discharge Bathroom Toilet: Standard Discharge Bathroom Accessibility: Yes How Accessible: Accessible via walker Does the patient have any problems obtaining your medications?: No   Social/Family/Support Systems Anticipated Caregiver: daughter, [name] Anticipated Caregiver's Contact Information: [name] [phone] Ability/Limitations of Caregiver: min assist mobility/ADLs Caregiver Availability: 24/7 Discharge Plan Discussed with Primary Caregiver: Yes Is Caregiver In Agreement with Plan?: Yes Does Caregiver/Family have Issues with Lodging/Transportation while Pt is in Rehab?: No   Goals Patient/Family Goal for Rehab: PT/OT/SLP supervision to mod I Expected length of stay: 6-9 days Additional Information: HD TRS Pt/Family Agrees to Admission and willing to participate: Yes Program Orientation Provided & Reviewed with Pt/Caregiver Including Roles  & Responsibilities: Yes  Barriers to Discharge: Insurance for SNF coverage   Decrease burden of Care through IP rehab admission: n/a   Possible need for SNF placement upon discharge: Not anticipated.  Pt with good support prior to admit and plan to d/c to same living situation.    Patient Condition: I have reviewed medical records from [facility], spoken with  [facility] team , and patient and daughter. I met with patient at the bedside and discussed via phone for inpatient rehabilitation assessment.  Patient will benefit from ongoing PT, OT, and SLP, can actively participate in 3 hours of therapy a day 5 days of the week, and can make measurable gains during the admission.  Patient will also benefit from the coordinated team approach during an Inpatient Acute Rehabilitation admission.  The patient will receive intensive therapy as well as Rehabilitation physician, nursing, social worker, and care management interventions.  Due to safety, disease management, medication administration, pain management, and patient education the patient requires 24 hour a day rehabilitation nursing.  The patient is currently min assist with mobility and basic ADLs.  Discharge setting and therapy post discharge at home with home health is anticipated.  Patient has agreed to participate in the Acute Inpatient Rehabilitation Program and will admit today.   Preadmission Screen Completed By:  Michel Santee, PT, DPT 07/16/2021 12:08 PM ______________________________________________________________________   Discussed status with Dr. Dagoberto Ligas on 07/16/21 . at w  and received approval for admission today.   Admission Coordinator:  Michel Santee, PT, DPT time 12:23 PM Sudie Grumbling 07/16/21     Assessment/Plan: Diagnosis: Does the need for close, 24 hr/day Medical supervision in concert with the patient's rehab needs make it unreasonable for this patient to be served in a less intensive setting? Yes Co-Morbidities requiring supervision/potential complications: DM, ESRD on HD T/H/S; HTN; PEA arrest, multiple rib fractures; PNA s/p intubation; aphasia with L MCA multiple infarcts- mild encephalopathy Due to bladder management, bowel management, safety, skin/wound care, disease management, medication administration, pain management, and patient education, does the patient require 24  hr/day rehab nursing? Yes Does the patient require coordinated care of a physician, rehab nurse, PT, OT, and SLP to address physical and functional deficits in the context of the above medical diagnosis(es)? Yes  Addressing deficits in the following areas: balance, endurance, locomotion, strength, transferring, bowel/bladder control, bathing, dressing, feeding, grooming, toileting, cognition, speech, language, and swallowing Can the patient actively participate in an intensive therapy program of at least 3 hrs of therapy 5 days a week? Yes The potential for patient to make measurable gains while on inpatient rehab is good Anticipated functional outcomes upon discharge from inpatient rehab: modified independent and supervision PT, modified independent and supervision OT, modified independent and supervision SLP Estimated rehab length of stay to reach the above functional goals is: 6-9 days Anticipated discharge destination: Home 10. Overall Rehab/Functional Prognosis: good     MD Signature:

## 2021-07-16 NOTE — Progress Notes (Signed)
Inpatient Rehab Admissions Coordinator:    I have insurance approval and a bed available for pt to admit to CIR today. Dr. Algis Liming in agreement.  Will let pt/family and TOC team know.   Shann Medal, PT, DPT Admissions Coordinator [phone] 07/16/21  11:04 AM

## 2021-07-16 NOTE — Plan of Care (Signed)
°  Problem: Education: Goal: Knowledge of General Education information will improve Description: Including pain rating scale, medication(s)/side effects and non-pharmacologic comfort measures Outcome: Progressing   Problem: Clinical Measurements: Goal: Respiratory complications will improve Outcome: Progressing Goal: Cardiovascular complication will be avoided Outcome: Progressing   Problem: Elimination: Goal: Will not experience complications related to bowel motility Outcome: Progressing Goal: Will not experience complications related to urinary retention Outcome: Progressing

## 2021-07-16 NOTE — TOC Transition Note (Signed)
Transition of Care [facility]) - CM/SW Discharge Note   Patient Details  Name: Stacey Dorsey MRN: [id number] Date of Birth: 1932-11-12  [facility] CM/SW Contact:  Pollie Friar, RN Phone Number: 07/16/2021, 10:46 AM   Clinical Narrative:    Patient is discharging to CIR today. CM signing off.    Final next level of care: IP Rehab Facility Barriers to Discharge: No Barriers Identified   Patient Goals and CMS Choice     Choice offered to / list presented to : Patient  Discharge Placement                       Discharge Plan and Services                                     Social Determinants of Health (SDOH) Interventions     Readmission Risk Interventions No flowsheet data found.

## 2021-07-16 NOTE — Progress Notes (Signed)
Patient ID: Stacey Dorsey, female   DOB: 1932-10-07, 87 y.o.   MRN: [id number] S: "I'm doing great".  She was able to walk with assistance in the halls.  O:BP (!) 172/62 (BP Location: Left Arm)    Pulse 74    Temp 98 F (36.7 C) (Oral)    Resp 19    Ht 5\' 9"  (1.753 m)    Wt 63.6 kg    SpO2 100%    BMI 20.71 kg/m  No intake or output data in the 24 hours ending 07/16/21 0935 Intake/Output: I/O last 3 completed shifts: In: 20 [P.O.:20] Out: -   Intake/Output this shift:  No intake/output data recorded. Weight change: 0.1 kg Gen: NAD  CVS:RRR Resp:CTA Abd:+BS, soft, NT/ND Ext: no edema, LUE AVG +T/B  Recent Labs  Lab 07/11/21 0840 07/16/21 0313  NA 140 137  K 4.1 4.7  CL 104 99  CO2 27 24  GLUCOSE 95 115*  BUN 37* 54*  CREATININE 6.45* 7.50*  ALBUMIN 2.8*   2.8* 2.7*  CALCIUM 9.0 8.7*  PHOS 4.2 6.1*  AST 24  --   ALT 16  --    Liver Function Tests: Recent Labs  Lab 07/11/21 0840 07/16/21 0313  AST 24  --   ALT 16  --   ALKPHOS 75  --   BILITOT 0.5  --   PROT 5.9*  --   ALBUMIN 2.8*   2.8* 2.7*   No results for input(s): LIPASE, AMYLASE in the last 168 hours. No results for input(s): AMMONIA in the last 168 hours. CBC: Recent Labs  Lab 07/11/21 0303 07/16/21 0313  WBC 7.7 8.6  HGB 10.1* 9.1*  HCT 31.6* 28.8*  MCV 91.1 90.6  PLT 192 260   Cardiac Enzymes: No results for input(s): CKTOTAL, CKMB, CKMBINDEX, TROPONINI in the last 168 hours. CBG: Recent Labs  Lab 07/15/21 0555 07/15/21 1249 07/15/21 1725 07/15/21 2117 07/16/21 0639  GLUCAP 110* 141* 121* 145* 124*    Iron Studies: No results for input(s): IRON, TIBC, TRANSFERRIN, FERRITIN in the last 72 hours. Studies/Results: No results found.   stroke: mapping our early stages of recovery book   Does not apply Once   acetaminophen  1,000 mg Oral TID   allopurinol  100 mg Oral Daily   aspirin EC  325 mg Oral Daily   carvedilol  25 mg Oral 2 times per day on Sun Mon Wed Fri   Chlorhexidine  Gluconate Cloth  6 each Topical Daily   cloNIDine  0.1 mg Oral BID   clopidogrel  75 mg Oral Daily   darbepoetin (ARANESP) injection - DIALYSIS  60 mcg Intravenous Q Thu-HD   docusate sodium  100 mg Oral BID   feeding supplement (NEPRO CARB STEADY)  237 mL Oral BID BM   heparin  5,000 Units Subcutaneous Q8H   multivitamin  1 tablet Oral QHS   NIFEdipine  60 mg Oral QHS   pantoprazole  40 mg Oral QHS   polyethylene glycol  17 g Oral Daily   rOPINIRole  0.5 mg Oral QHS   senna  2 tablet Oral Daily   simvastatin  20 mg Oral QHS    BMET    Component Value Date/Time   NA 137 07/16/2021 0313   K 4.7 07/16/2021 0313   CL 99 07/16/2021 0313   CO2 24 07/16/2021 0313   GLUCOSE 115 (H) 07/16/2021 0313   BUN 54 (H) 07/16/2021 0313   CREATININE 7.50 (H) 07/16/2021 8280  CALCIUM 8.7 (L) 07/16/2021 0313   GFRNONAA 5 (L) 07/16/2021 0313   CBC    Component Value Date/Time   WBC 8.6 07/16/2021 0313   RBC 3.18 (L) 07/16/2021 0313   HGB 9.1 (L) 07/16/2021 0313   HCT 28.8 (L) 07/16/2021 0313   PLT 260 07/16/2021 0313   MCV 90.6 07/16/2021 0313   MCH 28.6 07/16/2021 0313   MCHC 31.6 07/16/2021 0313   RDW 17.2 (H) 07/16/2021 0313   LYMPHSABS 5.1 (H) 12/31/2020 2334   MONOABS 1.1 (H) 12/31/2020 2334   EOSABS 0.4 12/31/2020 2334   BASOSABS 0.1 12/31/2020 2334    Dialysis Orders:  [facility]TS  3.5h   64.5kg  2/2 bath  400/500   LUA AVG  Hep none  Sensipar 30 ug tiw po Hectorol 5 ug tiw IV Mircera 75 ug q 2wks , last 12/01  Venofer 50 mg q. Weekly       Assessment/Plan: Acute CVA - L MCA by CT and MRI, +aphasia, on DAPT. Microembolic infarctions. EP consulted for loop recorder.  PEA arrest - Out of hospital, per primary.   ESRD - TTS HD. Next HD 12/20 to keep on outpatient schedule. Rib fractures/ chest pain - due to CPR and still complaining of chest pain. Pneumonia- improved, completed course of Rocephin/ azithro IV here HTN/vol - BP's high. Coreg/ procardia as at home,  clonidine added. 1kg under dry. Increased procardia to 60 qd. BP's better today.  Anemia-  Hb 9- 10. Weekly Aranesp 60ug on thursdays, last 12/15.  CKD-BMD- Ca ok, P ok  Donetta Potts, MD [employer] [phone]

## 2021-07-16 NOTE — PMR Pre-admission (Signed)
PMR Admission Coordinator Pre-Admission Assessment  Patient: Stacey Dorsey is an 87 y.o., female MRN: [id number] DOB: 23-Aug-1932 Height: 5' 9"  (175.3 cm) Weight: 63.6 kg  Insurance Information HMO: yes    PPO:      PCP:      IPA:      80/20:      OTHER:  PRIMARY: UHC Medicare      Policy#: [id number]      Subscriber: pt CM Name: Pierce Crane      Phone#: [phone]     Fax#: [phone] Pre-Cert#: [id number] auth for CIR from [place] [facility] [place] with updates due to fax listed above on 12/28      Employer:  Benefits:  Phone #: [phone]     Name:  Eff. Date: 03/28/21     Deduct: $0      Out of Pocket Max: [id number] (met [id number])      Life Max: n/a CIR: $430/day for days 1-4      SNF: 20 full days Outpatient:      Co-Pay: $30/visit Home Health: 100%      Co-Pay:  DME: 80%     Co-Ins: 20% Providers:  SECONDARY:       Policy#:      Phone#:   Development worker, community:       Phone#:   The Actuary for patients in Inpatient Rehabilitation Facilities with attached Privacy Act [place] Records was provided and verbally reviewed with: Patient and Family  Emergency Contact Information Contact Information     Name Relation Home Work Mobile   hargrove,cheryl Daughter   [phone]   white,safarah Granddaughter   [phone]       Current Medical History  Patient Admitting Diagnosis: CVA 2/2 cardiac arrest  History of Present Illness: Pt is an 87 y/o female with PMH of ESRD (HD TRS), COPD, DM, and HTN admitted to [facility] on 12/8 with PEA arrest.  EMS provided 15 minutes of CPR for ROSC.  King airway was placed in the field and replaced with ETT in the ED.  Workup included CTA chest which showed no PE but multiple bilateral rib fractures.  Further imaging revealed right-sided airspace disease, suggestive of PNA.  She was started on Rocephin and azithromycin and extubated on 12/9.  Admission labs NA 148, K 4.0, Troponin I 330, and BNP 1308.   Developed some delirium and ?sundowning on 12/11.  Rapid response called on 12/12 for confusion and expressive aphasia.  CT showed encephalomalacia, MRI showed multiple infarcts in the L MCA territory suspect from emboli during resuscitation.  MRA revealed moderate to severe narrowing in the L supraclinoid internal carotid and decreased perfusion in the L distal MCA branches.  She underwent carotid ultrasound which revealed R ICA stenosis of 40-59%, and L ICA stenosis 1-39%.  Neurology and EP cardiology consulted for placement of loop but she was deemed not a candidate and recommended for 30-day event monitor and outpatient f/u.  Now on DAPT with aspirin and plavix.  Echo revealed L ventricular EF of 50-55% and grade 1 diastolic dysfunction.  BLE dopplers no DVT.  EEG showed mild encephalopathy.  Therapy ongoing and pt was recommended for CIR.    Complete NIHSS TOTAL: 0  Patient's medical record from [facility] has been reviewed by the rehabilitation admission coordinator and physician.  Past Medical History  Past Medical History:  Diagnosis Date   Chronic kidney disease    COPD (chronic obstructive pulmonary disease) (HCC)    Diabetes mellitus  without complication ([place])    Hyperlipidemia    Hypertension     Has the patient had major surgery during 100 days prior to admission? No  Family History   family history is not on file.  Current Medications  Current Facility-Administered Medications:     stroke: mapping our early stages of recovery book, , Does not apply, Once, Amie Portland, MD   0.9 %  sodium chloride infusion, , Intravenous, PRN, Collene Gobble, MD, Stopped at 07/05/21 1832   acetaminophen (TYLENOL) tablet 1,000 mg, 1,000 mg, Oral, TID, Hongalgi, Anand D, MD, 1,000 mg at 07/16/21 1138   allopurinol (ZYLOPRIM) tablet 100 mg, 100 mg, Oral, Daily, Collene Gobble, MD, 100 mg at 07/16/21 1139   aspirin EC tablet 325 mg, 325 mg, Oral, Daily, Rosalin Hawking, MD, 325 mg at 07/16/21  1139   bisacodyl (DULCOLAX) suppository 10 mg, 10 mg, Rectal, Daily PRN, Collene Gobble, MD, 10 mg at 07/12/21 1554   carvedilol (COREG) tablet 25 mg, 25 mg, Oral, 2 times per day on Sun Mon Wed Fri, Byrum, Robert S, MD, 25 mg at 07/15/21 2300   Chlorhexidine Gluconate Cloth 2 % PADS 6 each, 6 each, Topical, Daily, Collene Gobble, MD, 6 each at 07/16/21 1141   cloNIDine (CATAPRES) tablet 0.1 mg, 0.1 mg, Oral, BID, Collene Gobble, MD, 0.1 mg at 07/16/21 1137   clopidogrel (PLAVIX) tablet 75 mg, 75 mg, Oral, Daily, Vance Gather B, MD, 75 mg at 07/16/21 1139   Darbepoetin Alfa (ARANESP) injection 60 mcg, 60 mcg, Intravenous, Q Thu-HD, Roney Jaffe, MD, 60 mcg at 07/11/21 1503   docusate sodium (COLACE) capsule 100 mg, 100 mg, Oral, BID, Collene Gobble, MD, 100 mg at 07/16/21 1142   docusate sodium (COLACE) capsule 100 mg, 100 mg, Oral, BID PRN, Collene Gobble, MD, 100 mg at 07/16/21 1138   feeding supplement (NEPRO CARB STEADY) liquid 237 mL, 237 mL, Oral, BID BM, Collene Gobble, MD, 237 mL at 07/16/21 1143   heparin injection 5,000 Units, 5,000 Units, Subcutaneous, Q8H, Collene Gobble, MD, 5,000 Units at 07/16/21 0620   hydrALAZINE (APRESOLINE) injection 10 mg, 10 mg, Intravenous, Q6H PRN, Collene Gobble, MD, 10 mg at 07/11/21 1718   HYDROmorphone (DILAUDID) tablet 1 mg, 1 mg, Oral, Q4H PRN, Vance Gather B, MD, 1 mg at 07/11/21 1410   HYDROmorphone (DILAUDID) tablet 2 mg, 2 mg, Oral, Q4H PRN, Vance Gather B, MD, 2 mg at 07/12/21 2226   ibuprofen (ADVIL) tablet 600 mg, 600 mg, Oral, Q6H PRN, Collene Gobble, MD, 600 mg at 07/14/21 1434   ipratropium-albuterol (DUONEB) 0.5-2.5 (3) MG/3ML nebulizer solution 3 mL, 3 mL, Nebulization, Q4H PRN, Collene Gobble, MD, 3 mL at 07/07/21 9622   labetalol (NORMODYNE) injection 10 mg, 10 mg, Intravenous, Q4H PRN, Collene Gobble, MD, 10 mg at 07/09/21 1221   melatonin tablet 3 mg, 3 mg, Oral, QHS PRN, Collene Gobble, MD, 3 mg at 07/12/21 2226    multivitamin (RENA-VIT) tablet 1 tablet, 1 tablet, Oral, QHS, Byrum, Rose Fillers, MD, 1 tablet at 07/15/21 2246   NIFEdipine (PROCARDIA XL/NIFEDICAL XL) 24 hr tablet 60 mg, 60 mg, Oral, QHS, Roney Jaffe, MD, 60 mg at 07/15/21 2245   ondansetron (ZOFRAN) injection 4 mg, 4 mg, Intravenous, Q6H PRN, Byrum, Rose Fillers, MD   pantoprazole (PROTONIX) EC tablet 40 mg, 40 mg, Oral, QHS, Collene Gobble, MD, 40 mg at 07/15/21 2243   phenol (CHLORASEPTIC) mouth  spray 1 spray, 1 spray, Mouth/Throat, PRN, Collene Gobble, MD, 1 spray at 07/05/21 1408   polyethylene glycol (MIRALAX / GLYCOLAX) packet 17 g, 17 g, Per Tube, Daily PRN, Byrum, Rose Fillers, MD   polyethylene glycol (MIRALAX / GLYCOLAX) packet 17 g, 17 g, Oral, Daily, Hongalgi, Anand D, MD, 17 g at 07/15/21 1036   rOPINIRole (REQUIP) tablet 0.5 mg, 0.5 mg, Oral, QHS, Byrum, Rose Fillers, MD, 0.5 mg at 07/15/21 2244   senna (SENOKOT) tablet 17.2 mg, 2 tablet, Oral, Daily, Hongalgi, Anand D, MD, 17.2 mg at 07/16/21 1137   simvastatin (ZOCOR) tablet 20 mg, 20 mg, Oral, QHS, Byrum, Rose Fillers, MD, 20 mg at 07/15/21 2244  Patients Current Diet:  Diet Order             Diet - low sodium heart healthy           Diet renal with fluid restriction Fluid restriction: 1200 mL Fluid; Room service appropriate? Yes with Assist; Fluid consistency: Thin  Diet effective now                   Precautions / Restrictions Precautions Precautions: Fall Precaution Comments: rib fxs (R4, 6-8 and L 6-9th) Restrictions Weight Bearing Restrictions: No   Has the patient had 2 or more falls or a fall with injury in the past year? Yes  Prior Activity Level Limited Community (1-2x/wk): daughter assisting with IADLs, but pt reports mod I with ADLs and mobility using RW, not driving  Prior Functional Level Self Care: Did the patient need help bathing, dressing, using the toilet or eating? Independent  Indoor Mobility: Did the patient need assistance with walking from room  to room (with or without device)? Independent  Stairs: Did the patient need assistance with internal or external stairs (with or without device)? Needed some help  Functional Cognition: Did the patient need help planning regular tasks such as shopping or remembering to take medications? Needed some help  Patient Information Are you of Hispanic, Latino/a,or Spanish origin?: A. No, not of Hispanic, Latino/a, or Spanish origin What is your race?: B. Black or African American Do you need or want an interpreter to communicate with a doctor or health care staff?: 0. No  Patient's Response To:  Health Literacy and Transportation Is the patient able to respond to health literacy and transportation needs?: Yes Health Literacy - How often do you need to have someone help you when you read instructions, pamphlets, or other written material from your doctor or pharmacy?: Never In the past 12 months, has lack of transportation kept you from medical appointments or from getting medications?: No In the past 12 months, has lack of transportation kept you from meetings, work, or from getting things needed for daily living?: No  Home Assistive Devices / [place] Devices/Equipment: Radio producer (specify quad or straight), Walker (specify type) Home Equipment: Conservation officer, nature (2 wheels), [employer] - single point, Hand held shower head, Grab bars - tub/shower, Shower seat  Prior Device Use: Indicate devices/aids used by the patient prior to current illness, exacerbation or injury? Walker  Current Functional Level Cognition  Arousal/Alertness: Awake/alert Overall Cognitive Status: Impaired/Different from baseline Current Attention Level: Sustained Orientation Level: Oriented X4 Following Commands: Follows one step commands with increased time Safety/Judgement: Decreased awareness of safety, Decreased awareness of deficits General Comments: step by step sequencing cues Attention: Sustained Sustained  Attention: Appears intact Memory: Impaired Memory Impairment: Storage deficit, Retrieval deficit, Decreased recall of new information Awareness:  Impaired Awareness Impairment: Emergent impairment Safety/Judgment: Impaired    Extremity Assessment (includes Sensation/Coordination)  Upper Extremity Assessment: Generalized weakness LUE Deficits / Details: dialysis side, patient with decreased strength and AROM as well as decreased coordination LUE Sensation: decreased light touch LUE Coordination: decreased fine motor  Lower Extremity Assessment: Defer to PT evaluation    ADLs  Overall ADL's : Needs assistance/impaired Eating/Feeding: Set up Eating/Feeding Details (indicate cue type and reason): sittiing in recliner Grooming: Min guard, Standing Grooming Details (indicate cue type and reason): EOB to wash face Lower Body Bathing: Minimal assistance, Sit to/from stand Lower Body Dressing: Moderate assistance Lower Body Dressing Details (indicate cue type and reason): min A sit<>stand; pain limiting factor in ADLs Toilet Transfer: Min guard, Ambulation, Rolling walker (2 wheels) Toileting- Clothing Manipulation and Hygiene: Min guard, Sit to/from stand Toileting - Clothing Manipulation Details (indicate cue type and reason): in standing Functional mobility during ADLs: Min guard, Rolling walker (2 wheels) General ADL Comments: pt expressed appreciation for therapy, ambulation, and bathing. Pt demonstrated functional imrpovements this session. Benefits from verbal cues for safety and sequencing    Mobility  Overal bed mobility: Needs Assistance Bed Mobility: Rolling, Sidelying to Sit, Sit to Sidelying Rolling: Min assist Sidelying to sit: Min assist Supine to sit: Min assist Sit to supine: Min assist Sit to sidelying: Min assist General bed mobility comments: pt hugging heart pillow, min A for trunk elevation/mod cues for technique and splinting    Transfers  Overall transfer level:  Needs assistance Equipment used: Rolling walker (2 wheels) Transfers: Sit to/from Stand Sit to Stand: Min assist Bed to/from chair/wheelchair/BSC transfer type:: Stand pivot Step pivot transfers: Min assist General transfer comment: from EOB to RW x3 trials, pt needs reminders for safe hand placement and unable to achieve upright with only min guard, needs minA lift assist    Ambulation / Gait / Stairs / Wheelchair Mobility  Ambulation/Gait Ambulation/Gait assistance: Herbalist (Feet): 125 Feet (59f, seated break, 121f Assistive device: Rolling walker (2 wheels) Gait Pattern/deviations: Step-through pattern, Decreased stride length, Shuffle, Narrow base of support, Drifts right/left General Gait Details: Pt drifting to L side of hallway and poor awareness of obstacles in room/on Rt side, needs manual assist at times for RW proximity/mgmt and cues to widen BOS but no overt scissoring this date Gait velocity: decreased Gait velocity interpretation: <1.31 ft/sec, indicative of household ambulator    Posture / Balance Dynamic Sitting Balance Sitting balance - Comments: cues for postural awareness initially, needs UE support Balance Overall balance assessment: Needs assistance Sitting-balance support: No upper extremity supported, Feet supported Sitting balance-Leahy Scale: Fair Sitting balance - Comments: cues for postural awareness initially, needs UE support Standing balance support: During functional activity, Single extremity supported Standing balance-Leahy Scale: Poor Standing balance comment: static standing single UE support, needs BUE support for dynamic tasks    Special needs/care consideration Dialysis: Hemodialysis Tuesday, Thursday, and Saturday and Diabetic management yes   Previous Home Environment (from acute therapy documentation) Living Arrangements: Children  Lives With: Family Available Help at Discharge: Family Type of Home: House Home Layout:  Two level, Able to live on main level with bedroom/bathroom Home Access: Stairs to enter [employer] Energyf Steps: 1 Bathroom Shower/Tub: WaMultimedia programmerStandard Bathroom Accessibility: Yes How Accessible: Accessible via walker HoStartexNo  Discharge Living Setting Plans for Discharge Living Setting: Lives with (comment) (daughter) Type of Home at Discharge: House Discharge Home Layout: Able to live on main  level with bedroom/bathroom Discharge Home Access: Stairs to enter Entrance Stairs-Rails: None Entrance Stairs-Number of Steps: 1 Discharge Bathroom Shower/Tub: Walk-in shower Discharge Bathroom Toilet: Standard Discharge Bathroom Accessibility: Yes How Accessible: Accessible via walker Does the patient have any problems obtaining your medications?: No  Social/Family/Support Systems Anticipated Caregiver: daughter, [name] Anticipated Caregiver's Contact Information: [name] [phone] Ability/Limitations of Caregiver: min assist mobility/ADLs Caregiver Availability: 24/7 Discharge Plan Discussed with Primary Caregiver: Yes Is Caregiver In Agreement with Plan?: Yes Does Caregiver/Family have Issues with Lodging/Transportation while Pt is in Rehab?: No  Goals Patient/Family Goal for Rehab: PT/OT/SLP supervision to mod I Expected length of stay: 6-9 days Additional Information: HD TRS Pt/Family Agrees to Admission and willing to participate: Yes Program Orientation Provided & Reviewed with Pt/Caregiver Including Roles  & Responsibilities: Yes  Barriers to Discharge: Insurance for SNF coverage  Decrease burden of Care through IP rehab admission: n/a  Possible need for SNF placement upon discharge: Not anticipated.  Pt with good support prior to admit and plan to d/c to same living situation.   Patient Condition: I have reviewed medical records from [facility], spoken with  [facility] team , and patient and daughter. I met with patient at the  bedside and discussed via phone for inpatient rehabilitation assessment.  Patient will benefit from ongoing PT, OT, and SLP, can actively participate in 3 hours of therapy a day 5 days of the week, and can make measurable gains during the admission.  Patient will also benefit from the coordinated team approach during an Inpatient Acute Rehabilitation admission.  The patient will receive intensive therapy as well as Rehabilitation physician, nursing, social worker, and care management interventions.  Due to safety, disease management, medication administration, pain management, and patient education the patient requires 24 hour a day rehabilitation nursing.  The patient is currently min assist with mobility and basic ADLs.  Discharge setting and therapy post discharge at home with home health is anticipated.  Patient has agreed to participate in the Acute Inpatient Rehabilitation Program and will admit today.  Preadmission Screen Completed By:  Michel Santee, PT, DPT 07/16/2021 12:08 PM ______________________________________________________________________   Discussed status with Dr. Dagoberto Ligas on 07/16/21 . at w  and received approval for admission today.  Admission Coordinator:  Michel Santee, PT, DPT time 12:23 PM Sudie Grumbling 07/16/21    Assessment/Plan: Diagnosis: Does the need for close, 24 hr/day Medical supervision in concert with the patient's rehab needs make it unreasonable for this patient to be served in a less intensive setting? Yes Co-Morbidities requiring supervision/potential complications: DM, ESRD on HD T/H/S; HTN; PEA arrest, multiple rib fractures; PNA s/p intubation; aphasia with L MCA multiple infarcts- mild encephalopathy Due to bladder management, bowel management, safety, skin/wound care, disease management, medication administration, pain management, and patient education, does the patient require 24 hr/day rehab nursing? Yes Does the patient require coordinated care of a physician,  rehab nurse, PT, OT, and SLP to address physical and functional deficits in the context of the above medical diagnosis(es)? Yes Addressing deficits in the following areas: balance, endurance, locomotion, strength, transferring, bowel/bladder control, bathing, dressing, feeding, grooming, toileting, cognition, speech, language, and swallowing Can the patient actively participate in an intensive therapy program of at least 3 hrs of therapy 5 days a week? Yes The potential for patient to make measurable gains while on inpatient rehab is good Anticipated functional outcomes upon discharge from inpatient rehab: modified independent and supervision PT, modified independent and supervision OT, modified independent and supervision SLP  Estimated rehab length of stay to reach the above functional goals is: 6-9 days Anticipated discharge destination: Home 10. Overall Rehab/Functional Prognosis: good   MD Signature:

## 2021-07-16 NOTE — Discharge Instructions (Signed)

## 2021-07-16 NOTE — H&P (Signed)
Physical Medicine and Rehabilitation Admission H&P    CC: Functional deficits due to stroke  HPI: 87 year old female who was in her usual state of health on the morning of July 04, 2021 when she arose and was preparing to go to hemodialysis.  She developed shortness of breath and subsequent PEA arrest.  EMS arrived and she received 15 minutes of CPR, ROSC obtained. King airway was placed in the field and was exchanged for an endotracheal tube at [facility] emergency department.  Work-up included CTA of the chest which showed no PE, but multiple bilateral rib fractures.  Imaging revealed right-sided air-space disease suggesting pneumonia and she was started on Rocephin and azithromycin.  She was extubated the following day, 12/09 at 1115.   She exhibited delirium and sundowning on 12/11 and suspected narcotic side-effect. On 12/12, rapid response was called due to confusion and expressive aphasia.  No extremity weakness. CT of the head showed encephalomalacia from old anterior MCA territory stroke. MRI of the brain was performed showing multiple infarcts suspected to be due to micro emboli during resuscitation.  MRA of the head revealed moderate to severe narrowing in the left supraclinoid internal carotid artery.  There was decreased perfusion in the left distal MCA branches without focal MCA stenosis or occlusion.  Multifocal narrowing of the right vertebral artery, which appeared occluded just proximal to the vertebrobasilar junction.  She also underwent carotid ultrasound which revealed right ICA stenosis of 40 to 59%, left ICA stenosis 1 to 39%.  Neurology was consulted.  EP cardiology also consulted for placement of loop recorder.  She was deemed not an appropriate candidate for loop recorder and 30-day event monitor with outpatient follow-up recommended.  Nephrology was consulted for chronic intermittent hemodialysis management.  Labs revealed Na  148, K4.0.  Hgb 11.2, Troponin I = 330, BNP  1308. No antithrombotics prior to admission now on dual antiplatelet therapy with aspirin and Plavix. The patient requires inpatient medicine and rehabilitation evaluations and services for ongoing dysfunction secondary to acute encephalopathy  Echocardiogram revealed left ventricular action fraction estimated at 50 to 31%, grade 1 diastolic dysfunction. Bilateral lower extremity venous Doppler ultrasound revealed no DVT. EEG: 07/08/2021>mild diffuse encephalopathy, nonspecific etiology. No seizures or epileptiform discharges were seen throughout the recording. Hemoglobin A1c 5.7, LDL 73.  Past medical history significant for end-stage renal disease on chronic intermittent hemodialysis on Tuesday, Thursday and Saturday schedule at [facility] via left upper extremity AV graft. No known prior history of stroke. Prior hysterectomy.  Patient lives with daughter, Michaelle Copas in [place]. She moved from [place], [place] approximately one and half years ago.  Pt reports needs a suppository every other night for having BM's- needs regularly- after dinner.  Also c/o difficulty coming up with the "right words". And then it "slips her mind" since the stroke.    Review of Systems  Constitutional:  Negative for chills and fever.       Complains of constant cold intolerance  Eyes:  Negative for blurred vision and double vision.       She complains of "trouble with my eyes" particularly when turning head side to side. Denies monocular blindness.  Respiratory:  Negative for cough and hemoptysis.   Cardiovascular:  Negative for chest pain and leg swelling.  Gastrointestinal:  Negative for constipation, nausea and vomiting.  Musculoskeletal:        Rib pain with twisting torso  Skin:  Negative for itching and rash.  Neurological:  Negative  for dizziness and headaches.  Psychiatric/Behavioral:  Positive for memory loss.   All other systems reviewed and are negative. Past Medical History:   Diagnosis Date   Chronic kidney disease    COPD (chronic obstructive pulmonary disease) ([place])    Diabetes mellitus without complication ([place])    Hyperlipidemia    Hypertension    Past Surgical History:  Procedure Laterality Date   ABDOMINAL HYSTERECTOMY     AV FISTULA PLACEMENT     IR REMOVAL TUN CV CATH W/O FL  09/07/2020    Social History:  reports that she has never smoked. She has never used smokeless tobacco. She reports that she does not use drugs. No history on file for alcohol use. Reports rare intake of occasional wine>>few sips  Family history: Mother deceased secondary to complications of childbirth Father deceased secondary to cancer (GI>>possibly liver) Sister: hypertension  Allergies:  Allergies  Allergen Reactions   Penicillins Rash   Prednisone Other (See Comments)    insomnia insomnia insomnia    Penicillins    Scallops [Shellfish Allergy]     Threw up blood   Shellfish Allergy    Medications Prior to Admission  Medication Sig Dispense Refill   albuterol (PROVENTIL) (2.5 MG/3ML) 0.083% nebulizer solution Take 2.5 mg by nebulization every 6 (six) hours as needed for wheezing or shortness of breath.      albuterol (VENTOLIN HFA) 108 (90 Base) MCG/ACT inhaler Inhale 2 puffs into the lungs every 4 (four) hours as needed for shortness of breath.     allopurinol (ZYLOPRIM) 100 MG tablet Take 100 mg by mouth daily.     AURYXIA 1 GM 210 MG(Fe) tablet Take 1 tablet by mouth 3 (three) times daily.     ibuprofen (ADVIL) 200 MG tablet Take 200 mg by mouth every 8 (eight) hours as needed for moderate pain.     lidocaine-prilocaine (EMLA) cream Apply 1 application topically 3 (three) times a week.     Menthol-Methyl Salicylate (MUSCLE RUB) 10-15 % CREA Apply 1 application topically as needed for muscle pain.     Multiple Vitamins-Minerals (MULTI FOR HER 50+) TABS Take 1 tablet by mouth daily.     Polyethyl Glycol-Propyl Glycol (SYSTANE OP) Place 1 drop into both eyes  daily as needed (dry eyes).     rOPINIRole (REQUIP) 0.5 MG tablet Take 0.5 mg by mouth at bedtime.     Sennosides (SENNA) 8.6 MG CAPS Take 1 tablet by mouth daily.     simvastatin (ZOCOR) 20 MG tablet Take 20 mg by mouth at bedtime.     [DISCONTINUED] carvedilol (COREG) 25 MG tablet Take 12.5 mg by mouth See admin instructions. Take 12.5 mg twice daily on nondailysis days (Sun, Mon, Wed, and Fri)     [DISCONTINUED] NIFEdipine (PROCARDIA-XL/NIFEDICAL-XL) 30 MG 24 hr tablet Take 30 mg by mouth at bedtime.     [DISCONTINUED] omeprazole (PRILOSEC) 20 MG capsule Take 40 mg by mouth 4 (four) times a week. On Sun, Mon, Wed, and Fri (nondialysis days)     [DISCONTINUED] fluticasone-salmeterol (ADVAIR HFA) 115-21 MCG/ACT inhaler Inhale 1 puff into the lungs daily as needed (shortness of breath).       Drug Regimen Review  Drug regimen was reviewed and remains appropriate with no significant issues identified  Home: Home Living Family/patient expects to be discharged to:: Private residence Living Arrangements: Children Available Help at Discharge: Family Type of Home: House Home Access: Stairs to enter [employer] of Steps: 1 Home Layout: Two level,  Able to live on main level with bedroom/bathroom Bathroom Shower/Tub: Multimedia programmer: Standard Bathroom Accessibility: Yes Home Equipment: Conservation officer, nature (2 wheels), [employer] - single point, Hand held shower head, Grab bars - tub/shower, Shower seat  Lives With: Family   Functional History: Prior Function Prior Level of Function : Needs assist  Cognitive Assist : Mobility (cognitive), ADLs (cognitive) Mobility (Cognitive): Set up cues ADLs (Cognitive): Set up cues Mobility Comments: patient states she walks in/out of the home without an AD. ADLs Comments: patient states daughter assists her with IADL, community mobility, bill payment, but patient able to care for her own self care... ? supervision given cognitive  defict  Functional Status:  Mobility: Bed Mobility Overal bed mobility: Needs Assistance Bed Mobility: Rolling, Sidelying to Sit, Sit to Sidelying Rolling: Min assist Sidelying to sit: Min assist Supine to sit: Min assist Sit to supine: Min assist Sit to sidelying: Min assist General bed mobility comments: pt hugging heart pillow, min A for trunk elevation/mod cues for technique and splinting Transfers Overall transfer level: Needs assistance Equipment used: Rolling walker (2 wheels) Transfers: Sit to/from Stand Sit to Stand: Min assist Bed to/from chair/wheelchair/BSC transfer type:: Stand pivot Step pivot transfers: Min assist General transfer comment: from EOB to RW x3 trials, pt needs reminders for safe hand placement and unable to achieve upright with only min guard, needs minA lift assist Ambulation/Gait Ambulation/Gait assistance: Min assist Gait Distance (Feet): 125 Feet (33ft, seated break, 150ft) Assistive device: Rolling walker (2 wheels) Gait Pattern/deviations: Step-through pattern, Decreased stride length, Shuffle, Narrow base of support, Drifts right/left General Gait Details: Pt drifting to L side of hallway and poor awareness of obstacles in room/on Rt side, needs manual assist at times for RW proximity/mgmt and cues to widen BOS but no overt scissoring this date Gait velocity: decreased Gait velocity interpretation: <1.31 ft/sec, indicative of household ambulator    ADL: ADL Overall ADL's : Needs assistance/impaired Eating/Feeding: Set up Eating/Feeding Details (indicate cue type and reason): sittiing in recliner Grooming: Min guard, Standing Grooming Details (indicate cue type and reason): EOB to wash face Lower Body Bathing: Minimal assistance, Sit to/from stand Lower Body Dressing: Moderate assistance Lower Body Dressing Details (indicate cue type and reason): min A sit<>stand; pain limiting factor in ADLs Toilet Transfer: Min guard, Ambulation, Rolling  walker (2 wheels) Toileting- Clothing Manipulation and Hygiene: Min guard, Sit to/from stand Toileting - Clothing Manipulation Details (indicate cue type and reason): in standing Functional mobility during ADLs: Min guard, Rolling walker (2 wheels) General ADL Comments: pt expressed appreciation for therapy, ambulation, and bathing. Pt demonstrated functional imrpovements this session. Benefits from verbal cues for safety and sequencing  Cognition: Cognition Overall Cognitive Status: Impaired/Different from baseline Arousal/Alertness: Awake/alert Orientation Level: Oriented X4 Attention: Sustained Sustained Attention: Appears intact Memory: Impaired Memory Impairment: Storage deficit, Retrieval deficit, Decreased recall of new information Awareness: Impaired Awareness Impairment: Emergent impairment Safety/Judgment: Impaired Cognition Arousal/Alertness: Awake/alert Behavior During Therapy: WFL for tasks assessed/performed Overall Cognitive Status: Impaired/Different from baseline Area of Impairment: Attention, Following commands, Safety/judgement, Awareness, Problem solving Orientation Level:  (oriented x4 today) Current Attention Level: Sustained Memory: Decreased recall of precautions Following Commands: Follows one step commands with increased time Safety/Judgement: Decreased awareness of safety, Decreased awareness of deficits Awareness: Emergent Problem Solving: Requires verbal cues, Slow processing General Comments: step by step sequencing cues  Physical Exam: Blood pressure (!) 172/62, pulse 74, temperature 98 F (36.7 C), temperature source Oral, resp. rate 19, height 5\' 9"  (1.753 m),  weight 63.6 kg, SpO2 100 %. Physical Exam Vitals and nursing note reviewed.  Constitutional:      General: She is not in acute distress.    Comments: Sitting up in bedside chair; appropriate- appears younger than stated age; wearing full dentures, NAD; c/o poor memory and lack of knowledge  about PEA arrest.   HENT:     Head: Normocephalic and atraumatic.     Comments: Tongue midline; smile equal    Right Ear: External ear normal.     Left Ear: External ear normal.     Nose: Nose normal. No congestion.     Mouth/Throat:     Mouth: Mucous membranes are dry.     Pharynx: Oropharynx is clear. No oropharyngeal exudate.     Comments: Wearing full dentures Eyes:     General:        Right eye: No discharge.        Left eye: No discharge.     Extraocular Movements: Extraocular movements intact.  Neck:     Vascular: No carotid bruit.  Cardiovascular:     Rate and Rhythm: Normal rate and regular rhythm.     Heart sounds: Normal heart sounds. No murmur heard.   No gallop.     Comments: RRR- no JVD Pulmonary:     Effort: Pulmonary effort is normal.     Breath sounds: No wheezing or rales.     Comments: CTA B/L- no W/R/R- good air movement  Abdominal:     General: Abdomen is flat. Bowel sounds are normal.     Tenderness: There is no abdominal tenderness.     Comments: Soft, NT,  ND, (+) normoactive BS  Musculoskeletal:     Cervical back: Normal range of motion and neck supple.     Comments: No lower extremity pain. Endorses restless legs>>well-controlled with Requip RUE 5-/5; LUE 5/5 RLE_ 5-/5; LLE 5/5  Skin:    General: Skin is warm and dry.     Comments: Heels slightly boggy B/L  IV R upper arms looks good Thrill LUE from fistula  Neurological:     Mental Status: She is alert and oriented to person, place, and time.     Comments: Makes eye contact with examiner. Follows commands. Face symmetric, tongue midline. Intact to light touch in all 4 extremities Naming 3/3; but appears to have poor memory; mild aphasia?  Psychiatric:        Mood and Affect: Mood normal.        Behavior: Behavior normal.    Results for orders placed or performed during the hospital encounter of 07/04/21 (from the past 48 hour(s))  Glucose, capillary     Status: Abnormal   Collection  Time: 07/14/21 12:21 PM  Result Value Ref Range   Glucose-Capillary 266 (H) 70 - 99 mg/dL    Comment: Glucose reference range applies only to samples taken after fasting for at least 8 hours.  Glucose, capillary     Status: Abnormal   Collection Time: 07/14/21  4:03 PM  Result Value Ref Range   Glucose-Capillary 67 (L) 70 - 99 mg/dL    Comment: Glucose reference range applies only to samples taken after fasting for at least 8 hours.  Glucose, capillary     Status: Abnormal   Collection Time: 07/14/21  5:17 PM  Result Value Ref Range   Glucose-Capillary 143 (H) 70 - 99 mg/dL    Comment: Glucose reference range applies only to samples taken after fasting for at  least 8 hours.  Glucose, capillary     Status: Abnormal   Collection Time: 07/14/21 10:24 PM  Result Value Ref Range   Glucose-Capillary 186 (H) 70 - 99 mg/dL    Comment: Glucose reference range applies only to samples taken after fasting for at least 8 hours.  Glucose, capillary     Status: Abnormal   Collection Time: 07/15/21  5:55 AM  Result Value Ref Range   Glucose-Capillary 110 (H) 70 - 99 mg/dL    Comment: Glucose reference range applies only to samples taken after fasting for at least 8 hours.  Glucose, capillary     Status: Abnormal   Collection Time: 07/15/21 12:49 PM  Result Value Ref Range   Glucose-Capillary 141 (H) 70 - 99 mg/dL    Comment: Glucose reference range applies only to samples taken after fasting for at least 8 hours.  Glucose, capillary     Status: Abnormal   Collection Time: 07/15/21  5:25 PM  Result Value Ref Range   Glucose-Capillary 121 (H) 70 - 99 mg/dL    Comment: Glucose reference range applies only to samples taken after fasting for at least 8 hours.  Glucose, capillary     Status: Abnormal   Collection Time: 07/15/21  9:17 PM  Result Value Ref Range   Glucose-Capillary 145 (H) 70 - 99 mg/dL    Comment: Glucose reference range applies only to samples taken after fasting for at least 8  hours.  Renal function panel     Status: Abnormal   Collection Time: 07/16/21  3:13 AM  Result Value Ref Range   Sodium 137 135 - 145 mmol/L   Potassium 4.7 3.5 - 5.1 mmol/L   Chloride 99 98 - 111 mmol/L   CO2 24 22 - 32 mmol/L   Glucose, Bld 115 (H) 70 - 99 mg/dL    Comment: Glucose reference range applies only to samples taken after fasting for at least 8 hours.   BUN 54 (H) 8 - 23 mg/dL   Creatinine, Ser 7.50 (H) 0.44 - 1.00 mg/dL   Calcium 8.7 (L) 8.9 - 10.3 mg/dL   Phosphorus 6.1 (H) 2.5 - 4.6 mg/dL   Albumin 2.7 (L) 3.5 - 5.0 g/dL   GFR, Estimated 5 (L) >60 mL/min    Comment: (NOTE) Calculated using the CKD-EPI Creatinine Equation (2021)    Anion gap 14 5 - 15    Comment: Performed at [facility] [address] [place], [place] [address]  CBC     Status: Abnormal   Collection Time: 07/16/21  3:13 AM  Result Value Ref Range   WBC 8.6 4.0 - 10.5 K/uL   RBC 3.18 (L) 3.87 - 5.11 MIL/uL   Hemoglobin 9.1 (L) 12.0 - 15.0 g/dL   HCT 28.8 (L) 36.0 - 46.0 %   MCV 90.6 80.0 - 100.0 fL   MCH 28.6 26.0 - 34.0 pg   MCHC 31.6 30.0 - 36.0 g/dL   RDW 17.2 (H) 11.5 - 15.5 %   Platelets 260 150 - 400 K/uL   nRBC 0.0 0.0 - 0.2 %    Comment: Performed at [facility], [place] [address] [place], [place] [address]  Glucose, capillary     Status: Abnormal   Collection Time: 07/16/21  6:39 AM  Result Value Ref Range   Glucose-Capillary 124 (H) 70 - 99 mg/dL    Comment: Glucose reference range applies only to samples taken after fasting for at least 8 hours.  No results found.     Medical Problem List and Plan: 1. Functional deficits secondary to anterior left MCA stroke/encephalomalacia further defined by MRI as multiple microembolic infarcts in that distribution; metabolic encephalopathy  -patient may  shower  -ELOS/Goals: 6-9 days supervision to mod I 2.  Antithrombotics: -DVT/anticoagulation:  Pharmaceutical: Heparin  -antiplatelet therapy: DAPT>>Plavix, aspirin 235  mg daily for 3 months then aspirin alone 3. Pain Management: Tylenol, Dilaudid- has rib pain- might need lidoderm patches? 4. Mood: LCSW to assess and provide support  -antipsychotic agents: n/a 5. Neuropsych: This patient is capable of making decisions on her own behalf. Consider neuropsych referral  6. Skin/Wound Care: routine skin checks 7. Fluids/Electrolytes/Nutrition: Is and Os and follow-up chemistries.  --Chronic iHD. T/T/S --Tolerating diet. 8. ESRD: TTS schedule  --[facility] 9. Acute on chronic hypoxic respiratory failure/pneumonia: completed ceftriaxone on 12/15.  --Currently normal SaO2 on RA 10: Anemia: [place] and improving -- Aranesp per nephrology 11: Hypertension: Systolic BP averaging 153-794F. Continue coreg, nifedipine, clonidine 12: Gout: no flare, continue allopurinol 13: GERD: continue Protonix 14: DM2: Hgb A1c = 5.7%.  --Continue CBGs checks, SSI 15: Grade 1 diastolic dysfunction: monitor  16: Bilateral carotid stenosis: will need annual carotid duplex follow-up 17: Hyperlipidemia: continue simvastatin 18: Rib fractures: aggressive pulmonary hygiene, encourage deep cough; IS.  --Consider outpatient PFTs/pulmonary follow-up 19. Restless leg syndrome: continue Requip 20 Chronic constipation- will schedule Suppository every other day after dinner.    I have personally performed a face to face diagnostic evaluation of this patient and formulated the key components of the plan.  Additionally, I have personally reviewed laboratory data, imaging studies, as well as relevant notes and concur with the physician assistant's documentation above.   The patient's status has not changed from the original H&P.  Any changes in documentation from the acute care chart have been noted above.     Barbie Banner, PA-C 07/16/2021

## 2021-07-16 NOTE — Discharge Summary (Signed)
Physician Discharge Summary  Stacey Dorsey BMW:[id number] DOB: 10-05-1932  PCP: Mindi Curling, PA-C  Admitted from: Home Discharged to: CIR  Admit date: 07/04/2021 Discharge date: 07/16/2021  Recommendations for Outpatient Follow-up:    Follow-up Information     [facility]. Schedule an appointment as soon as possible for a visit in 1 month(s).   Specialty: Neurology Why: stroke clinic Contact information: [place] [phone]        Rehab MD Follow up.   Why: Rehab MD will continue to follow at CIR.  Recommend repeating CBC and BMP once weekly, can be done across HD.        Mindi Curling, PA-C. Schedule an appointment as soon as possible for a visit.   Specialty: Physician Assistant Why: To be seen upon discharge from CIR. Contact information: [address] [place] [address] [phone]         Vickie Epley, MD Follow up.   Specialties: Cardiology, Radiology Why: MDs office is supposed to arrange for outpatient heart monitor.  They already have a office visit scheduled for February 2023. Contact information: [address] [place] [address] [phone]                At the time of discharge from CIR, patient will need prescription for all her meds.  Home Health: None    Equipment/Devices: TBD at CIR    Discharge Condition: Improved and stable   Code Status: Full Code Diet recommendation:  Discharge Diet Orders (From admission, onward)     Start     Ordered   07/16/21 0000  Diet - low sodium heart healthy        07/16/21 1017             Discharge Diagnoses:  Principal Problem:   Cardiac arrest [facility]) Active Problems:   Malnutrition of moderate degree   Cerebral embolism with cerebral infarction   Brief Summary: Stacey Dorsey is an 87 yo F hx ESRD on HD, poorly controlled HTN, T2DM, HLD, (presumed) COPD on  chronic supplemental oxygen who presented to [facility] 12/8 following cardiac arrest. Patient was in usual state of health until 12/8 morning when she felt short of breath while getting ready to go to dialysis. EMS was called, on EMS arrival pt was altered and subsequently sustained witnessed cardiac arrest -- PEA. Received 15 min CPR, 1 epi, 1g calcium, 50mg  bicarb, as well as 117mcg fent and  5mg  versed. King placed in field which was exchanged for ETT in ED. CXR revealed right-sided airspace disease. Opacities confirmed on CTA chest which showed no PE, suggested volume overload, and multiple bilateral rib fractures. CT head showed only encephalomalacia from old anterior MCA-territory stroke. Started on rocephin and azithromycin for pneumonia. Echocardiogram revealed LVEF 50-55%, G1DD, normal RV size and function, severely elevated PASP with estimated RVSP of 61.77mmHg. Volume status has been regulated by hemodialysis. Ultimately was extubated 12/9 and transferred out of ICU 12/11. CT head on that date due to memory difficulties was stable from admission. Due to persistence of symptoms, MRI brain was performed showing multiple infarcts suspected to be due to microemboli during resuscitation. Neurology consulted, stroke work-up completed.  Patient now stabilized and discharging to CIR for rehab.   Assessment & Plan:  OO[facility] PEA cardiac arrest: ROSC after 15 minutes. With asymmetric infiltrates, preceding dyspnea and preserved LVEF, pneumonia/hypoxia is presumptive trigger.  -Remained in sinus  rhythm on monitor. -CTA chest 12/8: No PE. -2D echo 12/8: LVEF 50-55%.   Rib fractures with musculoskeletal chest pain:  -CTA chest 12/8 showed acute fractures of the right fourth and sixth through eighth ribs and left sixth through ninth ribs. - Continue pain medication: Changed oxycodone prn to hydromorphone prn due to ESRD.  Changed acetaminophen to 1 g 3 times daily.  Chest pain has gradually improved.  Continue  scheduled Tylenol for couple more days and then can change to as needed.  The chest pain will likely take several weeks for rib fracture healing and pain to resolve.  This has been discussed with patient multiple times. -Continue aggressive incentive spirometry and mobilization. -As per nephrology, patient had transient chest pain at HD 12/16, EKG without acute changes.  Suspect this is likely musculoskeletal.  However to be sure, checked high-sensitivity troponin, 51 > 45, not consistent with ACS.   Acute metabolic encephalopathy, acute stroke: Suspected to be delirium as well as expressive aphasia due to stroke. EEG 12/12 was suggestive of mild, nonspecific, diffuse encephalopathy without seizures or epileptiform discharges.  - CT head suggested anterior left MCA stroke/encephalomalacia further defined by MRI as multiple microembolic infarcts in that distribution.  Neurology/stroke MD input appreciated: Left MCA scattered several punctate infarcts, etiology unclear but possibly cardioembolic versus cryptogenic.  MRI brain showed approximately 10 punctate acute infarction scattered within the left MCA territory consistent with microembolic infarctions.  MRA head: Moderate to severe narrowing in the left supraclinoid ICA.  Decreased perfusion in the left distal MCA branches, without focal MCA stenosis or occlusion.  Multifocal narrowing of the right vertebral artery, which appears occluded just proximal to the vertebrobasilar junction.  CUS: Right ICA 40-59% stenosis, left ICA 1-39% stenosis. B/L LEV Dopplers: No DVT.  TTE: LVEF 11-94%, grade 1 diastolic dysfunction.  A1c 5.7.  LDL 73.  SLP and OT recommend CIR, consulted.  Per neurology, patient and family have agreed on a loop recorder and neurology consulted EP cardiology for placement of same.  No antithrombotics PTA, now on DAPT with aspirin and Plavix.  As per EP cardiology input, not an appropriate candidate for loop recorder and recommend 30-day event  monitor with outpatient follow-up pending course and recovery to further discuss possibility of loop monitoring in the future.  EP cardiology will arrange same.   Acute on chronic hypoxic respiratory failure: Due to right-sided CAP vs. aspiration pneumonia -Completed a 7-day course of IV ceftriaxone. - Consider outpatient PFTs/pulmonary follow up. -Currently saturating in the high 90s on room air.  Hypoxia resolved.  ESRD:  - Continue HD TTS per nephrology.  Nephrology continues to follow Stacey Dorsey to be consulted for ongoing dialysis needs.  Next dialysis is supposed to be today 12/20.   HTN:  - Continue coreg, nifedipine, doses have been increased. Clonidine was added.  Blood pressures controlled now.   T2DM: HbA1c 5.7 %. Would not attempt too rigid of control to avoid neuroglycopenia.  -Hypoglycemic with CBG of 67 on 12/18 afternoon, likely due to SSI given for CBG of 266 earlier.  CBGs have been mostly well controlled and given normal A1c, run risk of hypoglycemia and hence SSI discontinued but continue to monitor CBGs.   AOCKD and iron deficiency anemia:  - Per nephrology. Continue monitoring. -Hemoglobin has dropped from 9.4-8.8 in the absence of overt bleeding.  May be a lab error.  Hemoglobin up to 10.1.  Stable.  Periodically follow-up across HD.   Gout: Chronic, quiescent.  - Continue allopurinol.  RLS:  - Continue ropinirole   HLD:  - Continue statin   Moderate protein-calorie malnutrition:  - MVM, protein supplementation   Constipation -Initiated bowel regimen pain   Suspect cognitive impairment: -Baseline mental status is not clear.  She does have memory impairment and repeatedly ask the same questions that have been answered.  Unsure if this is related to the cardiac arrest and multiple strokes.  Recommend outpatient neuropsychiatric follow-up.   Consultants:  PCCM Neurology Rehab MD Nephrology   Procedures:  Echo HD   Discharge Instructions  Discharge  Instructions     ([place]) Call MD:  Anytime you have any of the following symptoms: 1) 3 pound weight gain in 24 hours or 5 pounds in 1 week 2) shortness of breath, with or without a dry hacking cough 3) swelling in the hands, feet or stomach 4) if you have to sleep on extra pillows at night in order to breathe.   Complete by: As directed    Ambulatory referral to Neurology   Complete by: As directed    Follow up with stroke clinic NP (Jessica Vanschaick or Cecille Rubin, if both not available, consider Zachery Dauer, or A[place]) at [facility] in about 4 weeks. Thanks.   Call MD for:   Complete by: As directed    Strokelike symptoms or altered mental status.   Call MD for:  difficulty breathing, headache or visual disturbances   Complete by: As directed    Call MD for:  extreme fatigue   Complete by: As directed    Call MD for:  persistant dizziness or light-headedness   Complete by: As directed    Call MD for:  persistant nausea and vomiting   Complete by: As directed    Call MD for:  severe uncontrolled pain   Complete by: As directed    Call MD for:  temperature >100.4   Complete by: As directed    Diet - low sodium heart healthy   Complete by: As directed    Increase activity slowly   Complete by: As directed         Medication List     STOP taking these medications    Auryxia 1 GM 210 MG(Fe) tablet Generic drug: ferric citrate   Multi For Her 50+ Tabs       TAKE these medications    acetaminophen 500 MG tablet Commonly known as: TYLENOL Take 2 tablets (1,000 mg total) by mouth 3 (three) times daily. For 3 to 5 days and then can consider changing it to as needed based on improvement of her musculoskeletal chest pain related to rib fractures.   Advair HFA 115-21 MCG/ACT inhaler Generic drug: fluticasone-salmeterol Inhale 1 puff into the lungs 2 (two) times daily. What changed:  when to take this reasons to take this   albuterol (2.5 MG/3ML) 0.083%  nebulizer solution Commonly known as: PROVENTIL Take 2.5 mg by nebulization every 6 (six) hours as needed for wheezing or shortness of breath.   albuterol 108 (90 Base) MCG/ACT inhaler Commonly known as: VENTOLIN HFA Inhale 2 puffs into the lungs every 4 (four) hours as needed for shortness of breath.   allopurinol 100 MG tablet Commonly known as: ZYLOPRIM Take 100 mg by mouth daily.   aspirin 325 MG EC tablet Take 1 tablet (325 mg total) by mouth daily.   carvedilol 25 MG tablet Commonly known as: COREG Take 1 tablet (25 mg total) by mouth See admin instructions. Take 25 mg twice  daily on nondailysis days (Sun, Mon, Wed, and Fri) What changed:  how much to take additional instructions   cloNIDine 0.1 MG tablet Commonly known as: CATAPRES Take 1 tablet (0.1 mg total) by mouth 2 (two) times daily.   clopidogrel 75 MG tablet Commonly known as: PLAVIX Take 1 tablet (75 mg total) by mouth daily.   docusate sodium 100 MG capsule Commonly known as: COLACE Take 1 capsule (100 mg total) by mouth 2 (two) times daily.   feeding supplement (NEPRO CARB STEADY) Liqd Take 237 mLs by mouth 2 (two) times daily between meals.   HYDROmorphone 2 MG tablet Commonly known as: DILAUDID Take 1 tablet (2 mg total) by mouth every 4 (four) hours as needed for severe pain.   ibuprofen 200 MG tablet Commonly known as: ADVIL Take 200 mg by mouth every 8 (eight) hours as needed for moderate pain.   lidocaine-prilocaine cream Commonly known as: EMLA Apply 1 application topically 3 (three) times a week.   multivitamin Tabs tablet Take 1 tablet by mouth at bedtime.   Muscle Rub 10-15 % Crea Apply 1 application topically as needed for muscle pain.   NIFEdipine 30 MG 24 hr tablet Commonly known as: PROCARDIA-XL/NIFEDICAL-XL Take 2 tablets (60 mg total) by mouth at bedtime. What changed: how much to take   omeprazole 20 MG capsule Commonly known as: PRILOSEC Take 2 capsules (40 mg total) by  mouth daily. What changed:  when to take this additional instructions   polyethylene glycol 17 g packet Commonly known as: MIRALAX / GLYCOLAX Take 17 g by mouth daily.   rOPINIRole 0.5 MG tablet Commonly known as: REQUIP Take 0.5 mg by mouth at bedtime.   Senna 8.6 MG Caps Take 1 tablet by mouth daily.   simvastatin 20 MG tablet Commonly known as: ZOCOR Take 20 mg by mouth at bedtime.   SYSTANE OP Place 1 drop into both eyes daily as needed (dry eyes).       Allergies  Allergen Reactions   Penicillins Rash   Prednisone Other (See Comments)    insomnia insomnia insomnia    Penicillins    Scallops [Shellfish Allergy]     Threw up blood   Shellfish Allergy       Procedures/Studies: DG Abd 1 View  Result Date: 07/04/2021 CLINICAL DATA:  OG tube. EXAM: ABDOMEN - 1 VIEW COMPARISON:  CT chest 07/04/2021. FINDINGS: Orogastric tube tip is at the gastric fundus. No dilated bowel loops are seen. Small right pleural effusion and right basilar infiltrates persist. Endotracheal tube tip is 2 cm above the carina. Cardiomediastinal silhouette within normal limits. IMPRESSION: 1. Orogastric tube tip in the gastric fundus. Electronically Signed   By: Ronney Asters M.D.   On: 07/04/2021 18:36   CT HEAD WO CONTRAST (5MM)  Result Date: 07/07/2021 CLINICAL DATA:  87 year old female TIA. EXAM: CT HEAD WITHOUT CONTRAST TECHNIQUE: Contiguous axial images were obtained from the base of the skull through the vertex without intravenous contrast. COMPARISON:  Head CT 07/04/2021. FINDINGS: Brain: Stable bilateral basal ganglia vascular calcifications. Stable encephalomalacia in the left middle frontal gyrus along the anterior insula and operculum. Elsewhere gray-white matter differentiation remains normal for age. No superimposed midline shift, ventriculomegaly, mass effect, evidence of mass lesion, intracranial hemorrhage or evidence of cortically based acute infarction. Vascular: Calcified  atherosclerosis at the skull base. No suspicious intracranial vascular hyperdensity. Skull: Questionable chronic left orbital floor fracture. No acute osseous abnormality identified. Sinuses/Orbits: Visualized paranasal sinuses and mastoids are stable  and well aerated. Other: No acute orbit or scalp soft tissue finding. IMPRESSION: 1. Stable non contrast CT appearance of the brain. No acute or evolving infarct is identified. 2. Stable chronic appearing left MCA anterior division territory infarct. Electronically Signed   By: Genevie Ann M.D.   On: 07/07/2021 11:56   CT Head Wo Contrast  Result Date: 07/04/2021 CLINICAL DATA:  Altered mental status EXAM: CT HEAD WITHOUT CONTRAST TECHNIQUE: Contiguous axial images were obtained from the base of the skull through the vertex without intravenous contrast. COMPARISON:  None. FINDINGS: Brain: There are no signs of bleeding within the cranium. There is moderate sized area of decreased density in the left frontal lobe suggesting old infarct. Cortical sulci are prominent, more so in the left temporal region. Calcifications are seen in the basal ganglia on both sides. Vascular: There are scattered arterial calcifications. Skull: Unremarkable Sinuses/Orbits: There are no air-fluid levels in the paranasal sinuses. There is mild mucosal thickening in the ethmoid and sphenoid sinuses. Secretions are seen in the lumen of nasopharynx. Other: There is increased amount of CSF insula suggesting partial empty sella. IMPRESSION: No acute intracranial findings are seen in noncontrast CT brain. 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 Atrophy. Electronically Signed   By: Elmer Picker M.D.   On: 07/04/2021 10:49   CT Angio Chest PE W and/or Wo Contrast  Result Date: 07/04/2021 CLINICAL DATA:  CPR with ROSC obtained, suspect PE EXAM: CT ANGIOGRAPHY CHEST WITH CONTRAST TECHNIQUE: Multidetector CT imaging of the chest was performed using the standard protocol  during bolus administration of intravenous contrast. Multiplanar CT image reconstructions and MIPs were obtained to evaluate the vascular anatomy. CONTRAST:  110mL OMNIPAQUE IOHEXOL 350 MG/ML SOLN COMPARISON:  Same-day chest radiograph, CT chest 01/01/2021 FINDINGS: Cardiovascular: There is adequate opacification of the pulmonary arteries to the subsegmental level. There is no evidence of pulmonary embolism. The heart is enlarged. There are mitral annular calcifications, mild aortic valve calcifications, and scattered coronary artery calcifications. There is mild calcified atherosclerotic plaque in the nonaneurysmal thoracic aorta. There is reflux of contrast into the IVC suggesting right heart dysfunction. Mediastinum/Nodes: The thyroid is unremarkable. The esophagus is grossly unremarkable. There is no mediastinal, hilar, or axillary lymphadenopathy. Lungs/Pleura: The endotracheal tube tip is approximally 6 mm from the carina and approaches the right main bronchus. The trachea and central airways are patent. There is consolidation in the dependent lower lobes, right significantly worse than left. There are additional patchy opacities and interlobular septal thickening in the lung apices and left lung base. There are small bilateral pleural effusions. There is no pneumothorax. Upper Abdomen: The enteric catheter tip is in the distal stomach. A right adrenal adenoma is unchanged there are no acute findings in the upper abdomen. Musculoskeletal: There are acute fractures of the right fourth and sixth through eighth ribs and left sixth through ninth ribs. Review of the MIP images confirms the above findings. IMPRESSION: 1. No evidence of pulmonary embolism. 2. Findings above suggesting heart failure including cardiomegaly, patchy ground-glass opacities and interlobular septal thickening likely reflecting mild to moderate pulmonary interstitial edema, and small bilateral pleural effusions. Additionally, reflux of  contrast into the IVC suggests right heart dysfunction. 3. Consolidations in the bilateral lower lobes may reflect atelectasis, though pneumonia can not be excluded. 4. Multiple bilateral rib fractures likely related to CPR. 5. Endotracheal tube tip approaching the right main bronchus. Recommend retraction by approximately 1-2 cm. Electronically Signed   By: Valetta Mole  M.D.   On: 07/04/2021 13:21   MR ANGIO HEAD WO CONTRAST  Result Date: 07/09/2021 CLINICAL DATA:  Stroke follow-up EXAM: MRA HEAD WITHOUT CONTRAST TECHNIQUE: Angiographic images of the Circle of Willis were acquired using MRA technique without intravenous contrast. COMPARISON:  MRI 07/08/2021 FINDINGS: Evaluation is somewhat limited by motion artifact. Anterior circulation: Both internal carotid arteries are patent to the termini, although there is moderate to severe narrowing in the left supraclinoid portion (series 3, image 75). Hypoplastic or aplastic left A1. Normal right A1. Normal anterior communicating artery. Anterior cerebral arteries are patent to their distal aspects. No M1 stenosis or occlusion. Normal MCA bifurcations. Distal MCA branches perfused, although there is decreased perfusion in the left distal MCA branches, without focal stenosis or occlusion. Posterior circulation: Multifocal irregularity in the right vertebral artery, which appears occluded just proximal to the vertebrobasilar junction. The left vertebral artery is dominant and patent to the vertebrobasilar junction without stenosis. Basilar patent to its distal aspect. Superior cerebellar arteries patent bilaterally. PCAs perfused to their distal aspects without stenosis. The right posterior communicating artery is patent. The left posterior communicating artery is not definitively seen. Anatomic variants: None significant Other: None. IMPRESSION: 1. Evaluation is limited by motion artifact. Within this limitation, there is moderate to severe narrowing in the left  supraclinoid ICA. Decreased perfusion in the left distal MCA branches, without focal MCA stenosis or occlusion. 2. Multifocal narrowing of the right vertebral artery, which appears occluded just proximal to the vertebrobasilar junction. Electronically Signed   By: Merilyn Baba M.D.   On: 07/09/2021 20:15   MR BRAIN WO CONTRAST  Result Date: 07/08/2021 CLINICAL DATA:  Neuro deficit, acute, stroke suspected. EXAM: MRI HEAD WITHOUT CONTRAST TECHNIQUE: Multiplanar, multiecho pulse sequences of the brain and surrounding structures were obtained without intravenous contrast. COMPARISON:  Head CT yesterday. FINDINGS: Brain: Diffusion imaging shows approximately 10 punctate acute infarctions scattered within the left MCA territory consistent with micro embolic infarctions. No large or confluent acute infarction. No focal abnormality affects the brainstem or cerebellum. Old small vessel infarction in the right thalamus. Mild chronic small-vessel ischemic changes elsewhere within the cerebral hemispheric white matter. 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 No mass, hydrocephalus or extra-axial collection. Vascular: Major vessels at the base of the brain show flow. Skull and upper cervical spine: Negative Sinuses/Orbits: Clear/normal Other: Small right mastoid effusion. IMPRESSION: Approximately 10 punctate acute infarctions scattered within the left MCA territory consistent with micro embolic infarctions. 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 Mild chronic small-vessel ischemic changes elsewhere within the cerebral hemispheric white matter. Old lacunar infarction right thalamus. Electronically Signed   By: Nelson Chimes M.D.   On: 07/08/2021 19:52   DG Chest Port 1 View  Result Date: 07/06/2021 CLINICAL DATA:  Acute respiratory failure with hypoxia. EXAM: PORTABLE CHEST 1 VIEW COMPARISON:  July 05, 2021. FINDINGS: Stable cardiomegaly. Endotracheal and  nasogastric tubes have been removed. Bibasilar atelectasis is noted, right greater than left. Small pleural effusions may be present. Bony thorax is unremarkable. IMPRESSION: Endotracheal and nasogastric tubes have been removed. Stable bibasilar atelectasis is noted with small pleural effusions. Electronically Signed   By: Marijo Conception M.D.   On: 07/06/2021 09:18   DG Chest Port 1 View  Result Date: 07/05/2021 CLINICAL DATA:  Difficulty breathing EXAM: PORTABLE CHEST 1 VIEW COMPARISON:  Previous studies including the examination of 07/04/2021 FINDINGS: Transverse diameter of heart is increased. There is interval decrease  in pulmonary vascular congestion. There is improvement in aeration of parahilar regions and lower lung fields. Residual increased markings are seen in the medial lower lung fields. There is blunting of both lateral CP angles. There is no pneumothorax. Tip of endotracheal tube is 2.9 cm above the carina. Tip of enteric tube is seen in the fundus of the stomach. IMPRESSION: There is interval decrease in pulmonary vascular congestion and pulmonary edema. Increased markings in the medial lower lung fields may suggest crowding of normal bronchovascular structures due to poor inspiration or atelectasis/pneumonitis. Small bilateral pleural effusions are seen. Electronically Signed   By: Elmer Picker M.D.   On: 07/05/2021 08:13   DG Chest Portable 1 View  Result Date: 07/04/2021 CLINICAL DATA:  Code, post intubation EXAM: PORTABLE CHEST 1 VIEW COMPARISON:  01/01/2021 FINDINGS: Endotracheal tube is 3 cm above the carina. NG tube tip is near the GE junction. No confluent opacity on the left. Patchy airspace disease in the right perihilar region and right lower lobe with volume loss and elevation of the right hemidiaphragm. No visible effusions or pneumothorax. No acute bony abnormality. IMPRESSION: Airspace disease in the right perihilar region and right lower lobe concerning for pneumonia.  Volume loss on the right. Electronically Signed   By: Rolm Baptise M.D.   On: 07/04/2021 10:13   EEG adult  Result Date: 07/09/2021 Lora Havens, MD     07/09/2021  8:15 AM Patient Name: Stacey Dorsey MRN: [id number] Epilepsy Attending: Lora Havens Referring Physician/Provider: Anibal Henderson, NP Date: 07/08/2021 Duration: 23.01 mins Patient history: 87 y.o. female who presented to the hospital 12/8 for evaluation of out-of-hospital cardiac arrest with 15 minutes of CPR prior to ROSC.  EEG to evaluate for seizure. Level of alertness: Awake AEDs during EEG study: None Technical aspects: This EEG study was done with scalp electrodes positioned according to the 10-20 International system of electrode placement. Electrical activity was acquired at a sampling rate of 500Hz  and reviewed with a high frequency filter of 70Hz  and a low frequency filter of 1Hz . EEG data were recorded continuously and digitally stored. Description: The posterior dominant rhythm consists of 9 Hz activity of moderate voltage (25-35 uV) seen predominantly in posterior head regions, symmetric and reactive to eye opening and eye closing. EEG showed intermittent generalized 3 to 6 Hz theta-delta slowing. Hyperventilation and photic stimulation were not performed.   ABNORMALITY - Intermittent slow, generalized IMPRESSION: This study is suggestive of mild diffuse encephalopathy, nonspecific etiology. No seizures or epileptiform discharges were seen throughout the recording. Lora Havens   ECHOCARDIOGRAM COMPLETE  Result Date: 07/04/2021    ECHOCARDIOGRAM REPORT   Patient Name:   Stacey Dorsey Date of Exam: 07/04/2021 Medical Rec #:  [id number]     Height:       69.0 in Accession #:    [id number]    Weight:       176.4 lb Date of Birth:  28-Aug-1932     BSA:          1.958 m Patient Age:    75 years      BP:           173/87 mmHg Patient Gender: F             HR:           69 bpm. Exam Location:  Inpatient Procedure: 2D Echo  Indications:    cardiac arrest  History:        Patient has  no prior history of Echocardiogram examinations.                 Kidney disease; Signs/Symptoms:Shortness of Breath.  Sonographer:    Johny Chess RDCS Referring Phys: [phone] [place]  1. Left ventricular ejection fraction, by estimation, is 50 to 55%. The left ventricle has low normal function. The left ventricle has no regional wall motion abnormalities. There is moderate concentric left ventricular hypertrophy. Left ventricular diastolic parameters are consistent with Grade I diastolic dysfunction (impaired relaxation).  2. Right ventricular systolic function is normal. The right ventricular size is normal. There is severely elevated pulmonary artery systolic pressure. The estimated right ventricular systolic pressure is 30.8 mmHg.  3. Left atrial size was mildly dilated.  4. The mitral valve is degenerative. Trivial mitral valve regurgitation. Moderate mitral annular calcification.  5. The aortic valve is tricuspid. There is mild calcification of the aortic valve. There is mild thickening of the aortic valve. Aortic valve regurgitation is not visualized. Mild aortic valve stenosis.  6. The inferior vena cava is dilated in size with <50% respiratory variability, suggesting right atrial pressure of 15 mmHg. Comparison(s): No prior Echocardiogram. FINDINGS  Left Ventricle: Left ventricular ejection fraction, by estimation, is 50 to 55%. The left ventricle has low normal function. The left ventricle has no regional wall motion abnormalities. The left ventricular internal cavity size was normal in size. There is moderate concentric left ventricular hypertrophy. Left ventricular diastolic parameters are consistent with Grade I diastolic dysfunction (impaired relaxation). Right Ventricle: The right ventricular size is normal. Right vetricular wall thickness was not well visualized. Right ventricular systolic function is normal. There is  severely elevated pulmonary artery systolic pressure. The tricuspid regurgitant velocity is 3.41 m/s, and with an assumed right atrial pressure of 15 mmHg, the estimated right ventricular systolic pressure is 65.7 mmHg. Left Atrium: Left atrial size was mildly dilated. Right Atrium: Right atrial size was normal in size. Pericardium: There is no evidence of pericardial effusion. Mitral Valve: The mitral valve is degenerative in appearance. There is moderate thickening of the mitral valve leaflet(s). There is moderate calcification of the mitral valve leaflet(s). Moderate mitral annular calcification. Trivial mitral valve regurgitation. Tricuspid Valve: The tricuspid valve is normal in structure. Tricuspid valve regurgitation is mild. Aortic Valve: The aortic valve is tricuspid. There is mild calcification of the aortic valve. There is mild thickening of the aortic valve. Aortic valve regurgitation is not visualized. Mild aortic stenosis is present. Aortic valve mean gradient measures  9.0 mmHg. Aortic valve peak gradient measures 17.5 mmHg. Aortic valve area, by VTI measures 1.07 cm. Pulmonic Valve: The pulmonic valve was not well visualized. Pulmonic valve regurgitation is trivial. Aorta: The aortic root and ascending aorta are structurally normal, with no evidence of dilitation. Venous: The inferior vena cava is dilated in size with less than 50% respiratory variability, suggesting right atrial pressure of 15 mmHg. IAS/Shunts: No atrial level shunt detected by color flow Doppler.  LEFT VENTRICLE PLAX 2D LVIDd:         3.49 cm     Diastology LVIDs:         2.70 cm     LV e' medial:    4.79 cm/s LV PW:         1.20 cm     LV E/e' medial:  18.9 LV IVS:        1.29 cm     LV e' lateral:   7.51 cm/s LVOT diam:  1.70 cm     LV E/e' lateral: 12.1 LV SV:         47 LV SV Index:   24 LVOT Area:     2.27 cm  LV Volumes (MOD) LV vol d, MOD A4C: 60.4 ml LV vol s, MOD A4C: 28.4 ml LV SV MOD A4C:     60.4 ml RIGHT VENTRICLE              IVC RV S prime:     17.00 cm/s  IVC diam: 2.20 cm TAPSE (M-mode): 2.5 cm LEFT ATRIUM             Index        RIGHT ATRIUM          Index LA diam:        4.00 cm 2.04 cm/m   RA Area:     9.67 cm LA Vol (A2C):   59.5 ml 30.38 ml/m  RA Volume:   19.10 ml 9.75 ml/m LA Vol (A4C):   59.6 ml 30.43 ml/m LA Biplane Vol: 61.7 ml 31.50 ml/m  AORTIC VALVE AV Area (Vmax):    1.06 cm AV Area (Vmean):   1.03 cm AV Area (VTI):     1.07 cm AV Vmax:           209.00 cm/s AV Vmean:          141.000 cm/s AV VTI:            0.435 m AV Peak Grad:      17.5 mmHg AV Mean Grad:      9.0 mmHg LVOT Vmax:         97.20 cm/s LVOT Vmean:        64.250 cm/s LVOT VTI:          0.206 m LVOT/AV VTI ratio: 0.47  AORTA Ao Root diam: 2.70 cm Ao Asc diam:  3.20 cm MITRAL VALVE                TRICUSPID VALVE MV Area (PHT): 3.21 cm     TR Peak grad:   46.5 mmHg MV Decel Time: 236 msec     TR Vmax:        341.00 cm/s MV E velocity: 90.70 cm/s MV A velocity: 131.00 cm/s  SHUNTS MV E/A ratio:  0.69         Systemic VTI:  0.21 m                             Systemic Diam: 1.70 cm Gwyndolyn Kaufman MD Electronically signed by Gwyndolyn Kaufman MD Signature Date/Time: 07/04/2021/4:16:30 PM    Final    VAS US CAROTID (at [facility] and [facility] only)  Result Date: 07/09/2021 Carotid Arterial Duplex Study Patient Name:  Stacey Dorsey  Date of Exam:   07/09/2021 Medical Rec #: [id number]      Accession #:    [id number] Date of Birth: 1932/12/19      Patient Gender: F Patient Age:   88 years Exam Location:  [facility] Procedure:      VAS US CAROTID Referring Phys: Amie [place] --------------------------------------------------------------------------------  Indications:       CVA. Risk Factors:      None. Limitations        Today's exam was limited due to the patient's respiratory  variation and patient positioning, patient anatomy. Comparison Study:  No prior studies. Performing Technologist: Oliver Hum RVT  Examination  Guidelines: A complete evaluation includes B-mode imaging, spectral Doppler, color Doppler, and power Doppler as needed of all accessible portions of each vessel. Bilateral testing is considered an integral part of a complete examination. Limited examinations for reoccurring indications may be performed as noted.  Right Carotid Findings: +----------+--------+--------+--------+-----------------------+--------+             PSV cm/s EDV cm/s Stenosis Plaque Description      Comments  +----------+--------+--------+--------+-----------------------+--------+  CCA Prox   120      14                smooth and heterogenous           +----------+--------+--------+--------+-----------------------+--------+  CCA Distal 111      16                smooth and heterogenous           +----------+--------+--------+--------+-----------------------+--------+  ICA Prox   260      52       40-59%   calcific                          +----------+--------+--------+--------+-----------------------+--------+  ICA Mid    178      31                smooth and heterogenous           +----------+--------+--------+--------+-----------------------+--------+  ICA Distal 101      26                                        tortuous  +----------+--------+--------+--------+-----------------------+--------+  ECA        258      25                                                  +----------+--------+--------+--------+-----------------------+--------+ +----------+--------+-------+--------+-------------------+             PSV cm/s EDV cms Describe Arm Pressure (mmHG)  +----------+--------+-------+--------+-------------------+  Subclavian 113                                            +----------+--------+-------+--------+-------------------+ +---------+--------+--+--------+-+---------+  Vertebral PSV cm/s 61 EDV cm/s 7 Antegrade  +---------+--------+--+--------+-+---------+  Left Carotid Findings:  +----------+--------+--------+--------+-----------------------+--------+             PSV cm/s EDV cm/s Stenosis Plaque Description      Comments  +----------+--------+--------+--------+-----------------------+--------+  CCA Prox   69       7                 smooth and heterogenous           +----------+--------+--------+--------+-----------------------+--------+  CCA Distal 75       10                smooth and heterogenous           +----------+--------+--------+--------+-----------------------+--------+  ICA Prox   68       14  smooth and heterogenous           +----------+--------+--------+--------+-----------------------+--------+  ICA Distal 62       17                                        tortuous  +----------+--------+--------+--------+-----------------------+--------+  ECA        148      10                                                  +----------+--------+--------+--------+-----------------------+--------+ +----------+--------+--------+--------+-------------------+             PSV cm/s EDV cm/s Describe Arm Pressure (mmHG)  +----------+--------+--------+--------+-------------------+  Subclavian 154                                             +----------+--------+--------+--------+-------------------+ +---------+--------+---+--------+--+---------+  Vertebral PSV cm/s 151 EDV cm/s 29 Antegrade  +---------+--------+---+--------+--+---------+   Summary: Right Carotid: Velocities in the right ICA are consistent with a 40-59%                stenosis. Left Carotid: Velocities in the left ICA are consistent with a 1-39% stenosis. Vertebrals: Bilateral vertebral arteries demonstrate antegrade flow. *See table(s) above for measurements and observations.  Electronically signed by Deitra Mayo MD on 07/09/2021 at 4:00:38 PM.    Final    VAS [place] LOWER EXTREMITY VENOUS (DVT)  Result Date: 07/09/2021  Lower Venous DVT Study Patient Name:  Stacey Dorsey  Date of Exam:   07/09/2021 Medical Rec  #: [id number]      Accession #:    [id number] Date of Birth: May 01, 1933      Patient Gender: F Patient Age:   73 years Exam Location:  [facility] Procedure:      VAS [place] LOWER EXTREMITY VENOUS (DVT) Referring Phys: Cornelius Moras XU --------------------------------------------------------------------------------  Indications: Stroke.  Risk Factors: None identified. Limitations: Poor ultrasound/tissue interface and patient positioning. Comparison Study: No prior studies. Performing Technologist: Oliver Hum RVT  Examination Guidelines: A complete evaluation includes B-mode imaging, spectral Doppler, color Doppler, and power Doppler as needed of all accessible portions of each vessel. Bilateral testing is considered an integral part of a complete examination. Limited examinations for reoccurring indications may be performed as noted. The reflux portion of the exam is performed with the patient in reverse Trendelenburg.  +---------+---------------+---------+-----------+----------+--------------+  RIGHT     Compressibility Phasicity Spontaneity Properties Thrombus Aging  +---------+---------------+---------+-----------+----------+--------------+  CFV       Full            Yes       Yes                                    +---------+---------------+---------+-----------+----------+--------------+  SFJ       Full                                                             +---------+---------------+---------+-----------+----------+--------------+  FV Prox   Full                                                             +---------+---------------+---------+-----------+----------+--------------+  FV Mid    Full                                                             +---------+---------------+---------+-----------+----------+--------------+  FV Distal Full                                                             +---------+---------------+---------+-----------+----------+--------------+  PFV       Full                                                              +---------+---------------+---------+-----------+----------+--------------+  POP       Full            Yes       Yes                                    +---------+---------------+---------+-----------+----------+--------------+  PTV       Full                                                             +---------+---------------+---------+-----------+----------+--------------+  PERO      Full                                                             +---------+---------------+---------+-----------+----------+--------------+   +---------+---------------+---------+-----------+----------+--------------+  LEFT      Compressibility Phasicity Spontaneity Properties Thrombus Aging  +---------+---------------+---------+-----------+----------+--------------+  CFV       Full            Yes       Yes                                    +---------+---------------+---------+-----------+----------+--------------+  SFJ       Full                                                             +---------+---------------+---------+-----------+----------+--------------+  FV Prox   Full                                                             +---------+---------------+---------+-----------+----------+--------------+  FV Mid    Full            Yes       Yes                                    +---------+---------------+---------+-----------+----------+--------------+  FV Distal Full                                                             +---------+---------------+---------+-----------+----------+--------------+  PFV       Full                                                             +---------+---------------+---------+-----------+----------+--------------+  POP       Full            Yes       Yes                                    +---------+---------------+---------+-----------+----------+--------------+  PTV       Full                                                              +---------+---------------+---------+-----------+----------+--------------+  PERO      Full                                                             +---------+---------------+---------+-----------+----------+--------------+     Summary: RIGHT: - There is no evidence of deep vein thrombosis in the lower extremity. However, portions of this examination were limited- see technologist comments above.  - No cystic structure found in the popliteal fossa.  LEFT: - There is no evidence of deep vein thrombosis in the lower extremity. However, portions of this examination were limited- see technologist comments above.  - No cystic structure found in the popliteal fossa.  *See table(s) above for measurements and observations. Electronically signed by Deitra Mayo MD on 07/09/2021 at 4:00:19 PM.    Final       Subjective: Patient denies complaints.  Says that she is doing quite well and is now able to even turn around in bed better due to decrease in her  chest wall pain.  Able to take much deep breaths and says that she did?  2000 on the incentive spirometry.  No other complaints reported  Discharge Exam:  Vitals:   07/15/21 2306 07/16/21 0316 07/16/21 0500 07/16/21 0816  BP: 140/72 (!) 159/55  (!) 172/62  Pulse: 76 66  74  Resp: 18 18  19   Temp: 97.7 F (36.5 C) (!) 97.5 F (36.4 C)  98 F (36.7 C)  TempSrc: Oral Oral  Oral  SpO2: 100% 100%  100%  Weight:   63.6 kg   Height:        General exam: Elderly female, moderately built and nourished lying comfortably propped up in bed . aVF in left upper arm.   Respiratory system: Clear to auscultation.  No increased work of breathing.  Minimal anterior chest wall tenderness but improved compared to prior. Cardiovascular system: S1 & S2 heard, RRR. No JVD, murmurs, rubs, gallops or clicks. No pedal edema.   Gastrointestinal system: Abdomen is nondistended, soft and nontender.  Normal bowel sounds heard. Central nervous system: Alert and  oriented.?  Subtle diminished right nasolabial fold compared to left.  Did not appreciate any significant dysarthria or aphasia during my exam. Extremities: Symmetric 5 x 5 power. Skin: No rashes, lesions or ulcers Psychiatry: Judgement and insight impaired.  Forgetful.  Likely has underlying cognitive impairment. Mood & affect appropriate and pleasant.       The results of significant diagnostics from this hospitalization (including imaging, microbiology, ancillary and laboratory) are listed below for reference.     Microbiology: No results found for this or any previous visit (from the past 240 hour(s)).   Labs: CBC: Recent Labs  Lab 07/11/21 0303 07/16/21 0313  WBC 7.7 8.6  HGB 10.1* 9.1*  HCT 31.6* 28.8*  MCV 91.1 90.6  PLT 192 188    Basic Metabolic Panel: Recent Labs  Lab 07/11/21 0840 07/16/21 0313  NA 140 137  K 4.1 4.7  CL 104 99  CO2 27 24  GLUCOSE 95 115*  BUN 37* 54*  CREATININE 6.45* 7.50*  CALCIUM 9.0 8.7*  PHOS 4.2 6.1*    Liver Function Tests: Recent Labs  Lab 07/11/21 0840 07/16/21 0313  AST 24  --   ALT 16  --   ALKPHOS 75  --   BILITOT 0.5  --   PROT 5.9*  --   ALBUMIN 2.8*   2.8* 2.7*    CBG: Recent Labs  Lab 07/15/21 0555 07/15/21 1249 07/15/21 1725 07/15/21 2117 07/16/21 0639  GLUCAP 110* 141* 121* 145* 124*      Urinalysis    Component Value Date/Time   COLORURINE YELLOW 07/04/2021 1205   APPEARANCEUR HAZY (A) 07/04/2021 1205   LABSPEC 1.015 07/04/2021 1205   PHURINE 8.5 (H) 07/04/2021 1205   GLUCOSEU >=500 (A) 07/04/2021 1205   HGBUR SMALL (A) 07/04/2021 1205   BILIRUBINUR NEGATIVE 07/04/2021 [place] 07/04/2021 1205   PROTEINUR 100 (A) 07/04/2021 1205   NITRITE NEGATIVE 07/04/2021 1205   LEUKOCYTESUR NEGATIVE 07/04/2021 1205      Time coordinating discharge: 35 minutes  SIGNED:  Vernell Leep, MD,  FACP, [facility], [facility], [facility] (Care Management Physician Certified). [facility]  Physician Advisor  To contact the attending provider between 7A-7P or the covering provider during after hours 7P-7A, please log into the web site [email or web address] and access using universal  password for that web site. If you do not have the password, please call the  hospital operator.

## 2021-07-17 LAB — GLUCOSE, CAPILLARY: Glucose-Capillary: 106 mg/dL — ABNORMAL HIGH (ref 70–99)

## 2021-07-17 MED ORDER — TRAMADOL HCL 50 MG PO TABS
50.0000 mg | ORAL_TABLET | Freq: Three times a day (TID) | ORAL | Status: DC
  Administered 2021-07-17: 15:00:00 50 mg via ORAL
  Filled 2021-07-17 (×2): qty 1

## 2021-07-17 MED ORDER — LIDOCAINE 5 % EX PTCH
2.0000 | MEDICATED_PATCH | CUTANEOUS | Status: DC
  Administered 2021-07-17 – 2021-07-25 (×9): 2 via TRANSDERMAL
  Filled 2021-07-17 (×10): qty 2

## 2021-07-17 NOTE — Progress Notes (Signed)
Inpatient Rehabilitation Care Coordinator Assessment and Plan Patient Details  Name: Stacey Dorsey MRN: [id number] Date of Birth: 01-23-1933  Today's Date: 07/17/2021  Hospital Problems: Principal Problem:   Acute ischemic left MCA stroke [facility])  Past Medical History:  Past Medical History:  Diagnosis Date   Chronic kidney disease    COPD (chronic obstructive pulmonary disease) ([place])    Diabetes mellitus without complication ([place])    Hyperlipidemia    Hypertension    Past Surgical History:  Past Surgical History:  Procedure Laterality Date   ABDOMINAL HYSTERECTOMY     AV FISTULA PLACEMENT     IR REMOVAL TUN CV CATH W/O FL  09/07/2020   Social History:  reports that she has never smoked. She has never used smokeless tobacco. She reports that she does not currently use alcohol. She reports that she does not use drugs.  Family / Support Systems Marital Status: Divorced Patient Roles: Partner, Parent Spouse/Significant Other: partner/friend Research officer, trade union Children: 5 children. 2 children living. Pt lives with dtr Stacey Dorsey and pt has a son incarcerated. Other Supports: None reported Anticipated Caregiver: Stacey Dorsey Ability/Limitations of Caregiver: None reported Caregiver Availability: 24/7 Family Dynamics: Pt lives with her dtr and granddaughter.  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 [place] Education: some [place] - How often do you need to have someone help you when you read instructions, pamphlets, or other written material from your doctor or pharmacy?: Never Writes: Yes Employment Status: Retired Date Retired/Disabled/Unemployed: Chartered loss adjuster Issues: Denies Guardian/Conservator: N/A   Abuse/Neglect Abuse/Neglect Assessment Can Be Completed: Yes Physical Abuse: Denies Verbal Abuse: Denies Sexual Abuse: Denies Exploitation of  patient/patient's resources: Denies Self-Neglect: Denies  Patient response to: Social Isolation - How often do you feel lonely or isolated from those around you?: Never  Emotional Status Pt's affect, behavior and adjustment status: Pt in good spirits at time of visit Recent Psychosocial Issues: Denies Psychiatric History: Denies Substance Abuse History: Pt reports wine on occassion  Patient / Family Perceptions, Expectations & Goals Pt/Family understanding of illness & functional limitations: pt has a general understanding of care needs Premorbid pt/family roles/activities: Independent PTA and some assistance with some IADLs Anticipated changes in roles/activities/participation: Assistance with ADLs/IADLs Pt/family expectations/goals: Pt goal is to work on "talking  better."  [employer]: None Premorbid Home Care/DME Agencies: None Transportation available at discharge: Dtr Stacey Dorsey Is the patient able to respond to transportation needs?: Yes In the past 12 months, has lack of transportation kept you from medical appointments or from getting medications?: No In the past 12 months, has lack of transportation kept you from meetings, work, or from getting things needed for daily living?: No Resource referrals recommended: Neuropsychology  Discharge Planning Living Arrangements: Children Support Systems: Children Type of Residence: Private residence Insurance Resources: Multimedia programmer (specify) ([employer] Medicare) Museum/gallery curator Resources: Radio broadcast assistant Screen Referred: No Living Expenses: Lives with family Money Management: Family Does the patient have any problems obtaining your medications?: No Home Management: Pt reports that her dtr prepares meals and helps manage some housekeeping needs Patient/Family Preliminary Plans: TBD Care Coordinator Barriers to Discharge: Decreased caregiver support, Lack of/limited family support, Hemodialysis Care  Coordinator Anticipated Follow Up Needs: HH/OP  Clinical Impression SW met with pt in room to introduce self, explain role, and discuss discharge process. Pt is not a English as a second language teacher. No HCPOA. Pt is aware SW will make contact with pt  dtr Stacey Dorsey.   [id number] left message for pt dtr Stacey Dorsey to introduce self, explain role, discuss discharge process, and Sw will follow-up after team conference on Tuesday.   Lahela Woodin A Rakeen Gaillard 07/17/2021, 11:07 PM

## 2021-07-17 NOTE — Progress Notes (Signed)
Pt called nurse in the room saying she was feeling crazy like she wanted to get up and run out of the room. She believes it could have been the tramadol that she took earlier. She says she has a feeling of impending doom and was very hot. This nurse assisted her to the chair and she started to feel better. She would like to inquire about something for anxiety. She refused her tramadol this evening. Vs- bp 167/48 pulse- 68 oxygen 100. Patient sitting at the side of bed with no complaints at this time.

## 2021-07-17 NOTE — Evaluation (Signed)
Occupational Therapy Assessment and Plan  Patient Details  Name: Stacey Dorsey MRN: [id number] Date of Birth: [birth year]  OT Diagnosis: abnormal posture, acute pain, cognitive deficits, muscle weakness (generalized), and pain 2/2 multiple rib fractures Rehab Potential:   ELOS: 7-10 days   Today's Date: 07/17/2021 OT Individual Time: 2549-8264 OT Individual Time Calculation (min): 58 min     Hospital Problem: Principal Problem:   Acute ischemic left MCA stroke (HCC)   Past Medical History:  Past Medical History:  Diagnosis Date   Chronic kidney disease    COPD (chronic obstructive pulmonary disease) ([place])    Diabetes mellitus without complication ([place])    Hyperlipidemia    Hypertension    Past Surgical History:  Past Surgical History:  Procedure Laterality Date   ABDOMINAL HYSTERECTOMY     AV FISTULA PLACEMENT     IR REMOVAL TUN CV CATH W/O [facility]  09/07/2020    Assessment & Plan Clinical Impression: 87 year old female who was in her usual state of health on the morning of July 04, 2021 when she arose and was preparing to go to hemodialysis.  She developed shortness of breath and subsequent PEA arrest.  EMS arrived and she received 15 minutes of CPR, ROSC obtained. King airway was placed in the field and was exchanged for an endotracheal tube at [facility] emergency department.  Work-up included CTA of the chest which showed no PE, but multiple bilateral rib fractures.  Imaging revealed right-sided air-space disease suggesting pneumonia and she was started on Rocephin and azithromycin.  She was extubated the following day, 12/09 at 1115.    She exhibited delirium and sundowning on 12/11 and suspected narcotic side-effect. On 12/12, rapid response was called due to confusion and expressive aphasia.  No extremity weakness. CT of the head showed encephalomalacia from old anterior MCA territory stroke. MRI of the brain was performed showing multiple infarcts suspected to be due to micro  emboli during resuscitation.  MRA of the head revealed moderate to severe narrowing in the left supraclinoid internal carotid artery.  There was decreased perfusion in the left distal MCA branches without focal MCA stenosis or occlusion.  Multifocal narrowing of the right vertebral artery, which appeared occluded just proximal to the vertebrobasilar junction.  She also underwent carotid ultrasound which revealed right ICA stenosis of 40 to 59%, left ICA stenosis 1 to 39%.  Neurology was consulted.  EP cardiology also consulted for placement of loop recorder.  She was deemed not an appropriate candidate for loop recorder and 30-day event monitor with outpatient follow-up recommended.  Nephrology was consulted for chronic intermittent hemodialysis management.  Labs revealed Na  148, K4.0.  Hgb 11.2, Troponin I = 330, BNP 1308. No antithrombotics prior to admission now on dual antiplatelet therapy with aspirin and Plavix. The patient requires inpatient medicine and rehabilitation evaluations and services for ongoing dysfunction secondary to acute encephalopathy Patient transferred to CIR on 07/16/2021 .    Patient currently requires Min - mod with basic self-care skills secondary to muscle weakness, decreased cardiorespiratoy endurance, impaired timing and sequencing, decreased coordination, and decreased motor planning, decreased awareness, decreased problem solving, decreased safety awareness, decreased memory, and delayed processing, and decreased standing balance, decreased postural control, and decreased balance strategies.  Prior to hospitalization, patient could complete BADL with independent  - Supervision.  Patient will benefit from skilled intervention to decrease level of assist with basic self-care skills and increase independence with basic self-care skills prior to discharge home with care partner.  Anticipate patient will require 24 hour supervision and no further OT follow recommended.  OT - End  of Session Activity Tolerance: Tolerates 30+ min activity with multiple rests Endurance Deficit: Yes OT Assessment OT Patient demonstrates impairments in the following area(s): Balance;Cognition;Endurance;Motor;Pain;Safety OT Basic ADL's Functional Problem(s): Grooming;Bathing;Dressing;Toileting;Eating OT Transfers Functional Problem(s): Toilet;Tub/Shower OT Additional Impairment(s): None OT Plan OT Intensity: Minimum of 1-2 x/day, 45 to 90 minutes OT Frequency: 5 out of 7 days OT Duration/Estimated Length of Stay: 7-10 days OT Treatment/Interventions: Balance/vestibular training;Discharge planning;Pain management;Self Care/advanced ADL retraining;Therapeutic Activities;UE/LE Coordination activities;Visual/perceptual remediation/compensation;Therapeutic Exercise;Skin care/wound managment;Patient/family education;Disease mangement/prevention;Cognitive remediation/compensation;Functional mobility training;Community reintegration;DME/adaptive equipment instruction;Neuromuscular re-education;Psychosocial support;UE/LE Strength taining/ROM;Wheelchair propulsion/positioning OT Self Feeding Anticipated Outcome(s): Set up OT Basic Self-Care Anticipated Outcome(s): Supervision OT Toileting Anticipated Outcome(s): Supervision OT Bathroom Transfers Anticipated Outcome(s): Supervision OT Recommendation Patient destination: Home Follow Up Recommendations: None Equipment Recommended: To be determined   OT Evaluation Precautions/Restrictions  Precautions Precautions: Fall Precaution Comments: rib fxs (R4, 6-8 and L 6-9th), expressive aphasia Restrictions Weight Bearing Restrictions: No Pain Pain Assessment Pain Scale: 0-10 Pain Score: 4  Pain Location: Rib cage Home Living/Prior Functioning Home Living Family/patient expects to be discharged to:: Private residence Living Arrangements: Children Available Help at Discharge: Family Type of Home: House Home Access: Stairs to enter Engineer, site of Steps: 2 Entrance Stairs-Rails: None Home Layout: Two level, Able to live on main level with bedroom/bathroom Bathroom Shower/Tub: Multimedia programmer: Standard Bathroom Accessibility: Yes  Lives With: Family Prior Function Level of Independence: Independent with gait, Independent with transfers  Able to Take Stairs?: Yes Driving: No Vision Baseline Vision/History: 0 No visual deficits Ability to See in Adequate Light: 1 Impaired Patient Visual Report: No change from baseline Vision Assessment?: Vision impaired- to be further tested in functional context Perception  Perception: Within Functional Limits Praxis Praxis: Intact Cognition Overall Cognitive Status: Impaired/Different from baseline (Simultaneous filing. User may not have seen previous data.) Arousal/Alertness: Awake/alert (Simultaneous filing. User may not have seen previous data.) Orientation Level: Person;Place;Situation Person: Oriented Place: Oriented Situation: Oriented Year: Other (Comment) (1922  Simultaneous filing. User may not have seen previous data.) Month: December (Simultaneous filing. User may not have seen previous data.) Day of Week: Incorrect Memory: Impaired (Simultaneous filing. User may not have seen previous data.) Memory Impairment: Storage deficit;Retrieval deficit;Decreased recall of new information (Simultaneous filing. User may not have seen previous data.) Immediate Memory Recall: Sock;Blue;Bed Memory Recall Sock: Not able to recall Memory Recall Blue: Not able to recall Memory Recall Bed: Not able to recall Attention: Focused Focused Attention: Appears intact Sustained Attention: Appears intact Awareness: Impaired (Simultaneous filing. User may not have seen previous data.) Awareness Impairment: Emergent impairment Problem Solving: Impaired (Simultaneous filing. User may not have seen previous data.) Safety/Judgment: Impaired (Simultaneous filing. User may  not have seen previous data.) Sensation Sensation Light Touch: Appears Intact (Simultaneous filing. User may not have seen previous data.) Proprioception: Appears Intact Coordination Gross Motor Movements are Fluid and Coordinated: No (Simultaneous filing. User may not have seen previous data.) Fine Motor Movements are Fluid and Coordinated: No (Simultaneous filing. User may not have seen previous data.) Coordination and Movement Description: mildly uncoordinated 2/2 global weakness and deconditioning, pain from rib fractures limiting with some transitional movements Motor  Motor Motor: Abnormal postural alignment and control Motor - Skilled Clinical Observations: limited by weakness and rib pain  Trunk/Postural Assessment  Cervical Assessment Cervical Assessment: Within Functional Limits Thoracic Assessment Thoracic Assessment: Exceptions to [facility] (rounded shoulders) Lumbar Assessment Lumbar  Assessment: Within Functional Limits Postural Control Postural Control: Deficits on evaluation  Balance Balance Balance Assessed: Yes Dynamic Sitting Balance Dynamic Sitting - Balance Support: Feet supported Dynamic Sitting - Level of Assistance: 4: Min assist Dynamic Sitting - Balance Activities: Lateral lean/weight shifting;Forward lean/weight shifting;Reaching for objects Static Standing Balance Static Standing - Balance Support: During functional activity Static Standing - Level of Assistance: 4: Min assist Dynamic Standing Balance Dynamic Standing - Balance Support: During functional activity Dynamic Standing - Level of Assistance: 4: Min assist Dynamic Standing - Balance Activities: Lateral lean/weight shifting;Forward lean/weight shifting;Reaching for objects Extremity/Trunk Assessment RUE Assessment RUE Assessment: Within Functional Limits General Strength Comments: WFL for ADL tasks LUE Assessment LUE Assessment: Within Functional Limits General Strength Comments: WFL for ADL  tasks  Care Tool Care Tool Self Care Eating    Set up    Oral Care     Supervision    Bathing   Body parts bathed by patient: Right arm;Left arm;Chest;Abdomen;Front perineal area;Right upper leg;Left upper leg;Face;Buttocks Body parts bathed by helper: Right lower leg;Left lower leg   Assist Level: Moderate Assistance - Patient 50 - 74%    Upper Body Dressing(including orthotics)   What is the patient wearing?: Pull over shirt   Assist Level: Minimal Assistance - Patient > 75%    Lower Body Dressing (excluding footwear)   What is the patient wearing?: Underwear/pull up;Pants Assist for lower body dressing: Moderate Assistance - Patient 50 - 74%    Putting on/Taking off footwear   What is the patient wearing?: Non-skid slipper socks Assist for footwear: Moderate Assistance - Patient 50 - 74%       Care Tool Toileting Toileting activity    Min A     Care Tool Bed Mobility Roll left and right activity   Roll left and right assist level: Minimal Assistance - Patient > 75%    Sit to lying activity   Sit to lying assist level: Minimal Assistance - Patient > 75%    Lying to sitting on side of bed activity   Lying to sitting on side of bed assist level: the ability to move from lying on the back to sitting on the side of the bed with no back support.: Minimal Assistance - Patient > 75%     Care Tool Transfers Sit to stand transfer   Sit to stand assist level: Minimal Assistance - Patient > 75%    Chair/bed transfer   Chair/bed transfer assist level: Minimal Assistance - Patient > 75%     Toilet transfer   Assist Level: Minimal Assistance - Patient > 75%     Care Tool Cognition  Expression of Ideas and Wants Expression of Ideas and Wants: 3. Some difficulty - exhibits some difficulty with expressing needs and ideas (e.g, some words or finishing thoughts) or speech is not clear  Understanding Verbal and Non-Verbal Content Understanding Verbal and Non-Verbal Content:  3. Usually understands - understands most conversations, but misses some part/intent of message. Requires cues at times to understand   Memory/Recall Ability Memory/Recall Ability : Current season;Location of own room;Staff names and faces;That he or she is in a hospital/hospital unit   Refer to Care Plan for [place] 1 OT Short Term Goal 1 (Week 1): STGs = LTGs 2/2 ELOS at Supervision  Recommendations for other services: None    Skilled Therapeutic Intervention ADL ADL Eating: Not assessed Grooming: Supervision/safety Upper Body Bathing: Supervision/safety Lower Body Bathing: Moderate assistance  Upper Body Dressing: Minimal assistance Lower Body Dressing: Moderate assistance Toileting: Not assessed Toilet Transfer: Minimal assistance Toilet Transfer Method: Ambulating Mobility  Bed Mobility Bed Mobility: Rolling Right;Right Sidelying to Sit;Supine to Sit Rolling Right: Minimal Assistance - Patient > 75% Rolling Left: Minimal Assistance - Patient > 75% Right Sidelying to Sit: Minimal Assistance - Patient > 75% Supine to Sit: Minimal Assistance - Patient > 75% Sit to Supine: Minimal Assistance - Patient > 75% Transfers Sit to Stand: Minimal Assistance - Patient > 75% Stand to Sit: Minimal Assistance - Patient > 75%  Skilled Interventions: Pt greeted at time of session semireclined in bed finishing up speaking with SW, pt very concerned about cognition and CLOF. Educated on role and purpose of OT, pt agreeable to session. Evidence of expressive aphasia throughout session but pt educated to pace self and provided extended time for communication. Pt performing bed mobiltiy with Min A, short distance ambulation with RW to sink level Min A, and UB/LB bathing seated and standing with Mod A for BLEs past knee level. UB/LB dressing with Min A for UB and Mod A for LB. Cues throughout session for compensatory techniques and ways to reduce pain 2/2 multiple rib  fractures and pt receptive. Pt resting bed level alarm on call bell in reach.    Discharge Criteria: Patient will be discharged from OT if patient refuses treatment 3 consecutive times without medical reason, if treatment goals not met, if there is a change in medical status, if patient makes no progress towards goals or if patient is discharged from hospital.  The above assessment, treatment plan, treatment alternatives and goals were discussed and mutually agreed upon: by patient  Viona Gilmore 07/17/2021, 12:45 PM

## 2021-07-17 NOTE — Evaluation (Signed)
Physical Therapy Assessment and Plan  Patient Details  Name: Stacey Dorsey MRN: [id number] Date of Birth: May 19, 1933  PT Diagnosis: Abnormality of gait and Difficulty walking Rehab Potential: Excellent ELOS: 7-10 days   Today's Date: 07/17/2021 PT Individual Time: 1100-1200 PT Individual Time Calculation (min): 60 min    Hospital Problem: Principal Problem:   Acute ischemic left MCA stroke ([place])   Past Medical History:  Past Medical History:  Diagnosis Date   Chronic kidney disease    COPD (chronic obstructive pulmonary disease) ([place])    Diabetes mellitus without complication ([place])    Hyperlipidemia    Hypertension    Past Surgical History:  Past Surgical History:  Procedure Laterality Date   ABDOMINAL HYSTERECTOMY     AV FISTULA PLACEMENT     IR REMOVAL TUN CV CATH W/O [facility]  09/07/2020    Assessment & Plan Clinical Impression:  Pt is 87 year old female who was in her usual state of health on the morning of July 04, 2021 when she arose and was preparing to go to hemodialysis.  She developed shortness of breath and subsequent PEA arrest.  EMS arrived and she received 15 minutes of CPR, ROSC obtained. King airway was placed in the field and was exchanged for an endotracheal tube at [facility] emergency department.  Work-up included CTA of the chest which showed no PE, but multiple bilateral rib fractures.  Imaging revealed right-sided air-space disease suggesting pneumonia and she was started on Rocephin and azithromycin.  She was extubated the following day, 12/09 at 1115.    She exhibited delirium and sundowning on 12/11 and suspected narcotic side-effect. On 12/12, rapid response was called due to confusion and expressive aphasia.  No extremity weakness. CT of the head showed encephalomalacia from old anterior MCA territory stroke. MRI of the brain was performed showing multiple infarcts suspected to be due to micro emboli during resuscitation.  MRA of the head revealed  moderate to severe narrowing in the left supraclinoid internal carotid artery.  There was decreased perfusion in the left distal MCA branches without focal MCA stenosis or occlusion.  Multifocal narrowing of the right vertebral artery, which appeared occluded just proximal to the vertebrobasilar junction.  She also underwent carotid ultrasound which revealed right ICA stenosis of 40 to 59%, left ICA stenosis 1 to 39%.  Neurology was consulted.  EP cardiology also consulted for placement of loop recorder.  She was deemed not an appropriate candidate for loop recorder and 30-day event monitor with outpatient follow-up recommended.  Nephrology was consulted for chronic intermittent hemodialysis management.  Labs revealed Na  148, K4.0.  Hgb 11.2, Troponin I = 330, BNP 1308. No antithrombotics prior to admission now on dual antiplatelet therapy with aspirin and Plavix. The patient requires inpatient medicine and rehabilitation evaluations and services for ongoing dysfunction secondary to acute encephalopathy. Patient transferred to CIR on 07/16/2021 .   Patient currently requires min with mobility secondary to muscle weakness, decreased cardiorespiratoy endurance, and decreased standing balance, decreased postural control, and decreased balance strategies.  Prior to hospitalization, patient was independent  with mobility and lived with Family in a House home.  Home access is 2Stairs to enter.  Patient will benefit from skilled PT intervention to maximize safe functional mobility, minimize fall risk, and decrease caregiver burden for planned discharge home with 24 hour assist.  Anticipate patient will benefit from follow up [facility] at discharge.  PT - End of Session Activity Tolerance: Tolerates 30+ min activity with multiple rests Endurance  Deficit: Yes Endurance Deficit Description: frequent rest breaks during functional activity PT Assessment Rehab Potential (ACUTE/IP ONLY): Excellent PT Barriers to Discharge:  Home environment access/layout PT Patient demonstrates impairments in the following area(s): Balance;Endurance;Safety PT Transfers Functional Problem(s): Bed Mobility;Bed to Chair;Car;Furniture;Floor PT Locomotion Functional Problem(s): Ambulation;Wheelchair Mobility;Stairs PT Plan PT Intensity: Minimum of 1-2 x/day ,45 to 90 minutes PT Frequency: 5 out of 7 days PT Duration Estimated Length of Stay: 7-10 days PT Treatment/Interventions: Ambulation/gait training;Balance/vestibular training;Community reintegration;Discharge planning;Disease management/prevention;DME/adaptive equipment instruction;Functional mobility training;Neuromuscular re-education;Pain management;Patient/family education;Stair training;Therapeutic Activities;Therapeutic Exercise;UE/LE Strength taining/ROM;UE/LE Coordination activities PT Transfers Anticipated Outcome(s): Supervision PT Locomotion Anticipated Outcome(s): Supervision with LRAD PT Recommendation Follow Up Recommendations: Home health PT Patient destination: Home Equipment Recommended: Rolling walker with 5" wheels Equipment Details: pt owns rollator, will likely need RW at d/c   PT Evaluation Precautions/Restrictions Precautions Precautions: Fall Precaution Comments: rib fxs (R4, 6-8 and L 6-9th), expressive aphasia Restrictions Weight Bearing Restrictions: No Pain Interference Pain Interference Pain Effect on Sleep: 2. Occasionally Pain Interference with Therapy Activities: 2. Occasionally Pain Interference with Day-to-Day Activities: 2. Occasionally Home Living/Prior Functioning Home Living Available Help at Discharge: Family Type of Home: House Home Access: Stairs to enter [employer] of Steps: 2 Entrance Stairs-Rails: None Home Layout: Two level;Able to live on main level with bedroom/bathroom Bathroom Shower/Tub: Multimedia programmer: Standard Bathroom Accessibility: Yes  Lives With: Family Prior Function Level of  Independence: Independent with gait;Independent with transfers  Able to Take Stairs?: Yes Driving: No Vision/Perception  Vision - History Ability to See in Adequate Light: 1 Impaired Perception Perception: Within Functional Limits Praxis Praxis: Intact  Cognition Overall Cognitive Status: Impaired/Different from baseline (Simultaneous filing. User may not have seen previous data.) Arousal/Alertness: Awake/alert (Simultaneous filing. User may not have seen previous data.) Orientation Level: Oriented X4 Year: Other (Comment) (1922  Simultaneous filing. User may not have seen previous data.) Month: December (Simultaneous filing. User may not have seen previous data.) Day of Week: Incorrect Attention: Focused Focused Attention: Appears intact Sustained Attention: Appears intact Memory: Impaired (Simultaneous filing. User may not have seen previous data.) Memory Impairment: Storage deficit;Retrieval deficit;Decreased recall of new information (Simultaneous filing. User may not have seen previous data.) Immediate Memory Recall: Sock;Blue;Bed Memory Recall Sock: Not able to recall Memory Recall Blue: Not able to recall Memory Recall Bed: Not able to recall Awareness: Impaired (Simultaneous filing. User may not have seen previous data.) Awareness Impairment: Emergent impairment Problem Solving: Impaired (Simultaneous filing. User may not have seen previous data.) Safety/Judgment: Impaired (Simultaneous filing. User may not have seen previous data.) Sensation Sensation Light Touch: Appears Intact (Simultaneous filing. User may not have seen previous data.) Proprioception: Appears Intact Coordination Gross Motor Movements are Fluid and Coordinated: No (Simultaneous filing. User may not have seen previous data.) Fine Motor Movements are Fluid and Coordinated: No (Simultaneous filing. User may not have seen previous data.) Coordination and Movement Description: mildly uncoordinated 2/2  global weakness and deconditioning, pain from rib fractures limiting with some transitional movements Motor  Motor Motor: Abnormal postural alignment and control Motor - Skilled Clinical Observations: limited by weakness and rib pain   Trunk/Postural Assessment  Cervical Assessment Cervical Assessment: Within Functional Limits Thoracic Assessment Thoracic Assessment: Exceptions to [facility] (rounded shoulders) Lumbar Assessment Lumbar Assessment: Within Functional Limits Postural Control Postural Control: Deficits on evaluation  Balance Balance Balance Assessed: Yes Static Sitting Balance Static Sitting - Balance Support: No upper extremity supported;Feet supported Static Sitting - Level of Assistance: 5: Stand by assistance  Dynamic Sitting Balance Dynamic Sitting - Balance Support: Feet supported Dynamic Sitting - Level of Assistance: 4: Min assist Dynamic Sitting - Balance Activities: Lateral lean/weight shifting;Forward lean/weight shifting;Reaching for objects Static Standing Balance Static Standing - Balance Support: During functional activity Static Standing - Level of Assistance: 4: Min assist Dynamic Standing Balance Dynamic Standing - Balance Support: During functional activity Dynamic Standing - Level of Assistance: 4: Min assist Dynamic Standing - Balance Activities: Lateral lean/weight shifting;Forward lean/weight shifting;Reaching for objects Extremity Assessment  RUE Assessment RUE Assessment: Within Functional Limits General Strength Comments: WFL for ADL tasks LUE Assessment LUE Assessment: Within Functional Limits General Strength Comments: WFL for ADL tasks RLE Assessment RLE Assessment: Within Functional Limits General Strength Comments: 4 to 5/5 grossly LLE Assessment LLE Assessment: Within Functional Limits General Strength Comments: 5/5 grossly  Care Tool Care Tool Bed Mobility Roll left and right activity   Roll left and right assist level: Minimal  Assistance - Patient > 75%    Sit to lying activity   Sit to lying assist level: Minimal Assistance - Patient > 75%    Lying to sitting on side of bed activity   Lying to sitting on side of bed assist level: the ability to move from lying on the back to sitting on the side of the bed with no back support.: Minimal Assistance - Patient > 75%     Care Tool Transfers Sit to stand transfer   Sit to stand assist level: Minimal Assistance - Patient > 75%    Chair/bed transfer   Chair/bed transfer assist level: Minimal Assistance - Patient > 75%     Toilet transfer   Assist Level: Minimal Assistance - Patient > 75%    Car transfer   Car transfer assist level: Minimal Assistance - Patient > 75%      Care Tool Locomotion Ambulation   Assist level: Minimal Assistance - Patient > 75% Assistive device: Walker-rolling Max distance: 200'  Walk 10 feet activity   Assist level: Minimal Assistance - Patient > 75% Assistive device: Walker-rolling   Walk 50 feet with 2 turns activity   Assist level: Minimal Assistance - Patient > 75% Assistive device: Walker-rolling  Walk 150 feet activity   Assist level: Minimal Assistance - Patient > 75% Assistive device: Walker-rolling  Walk 10 feet on uneven surfaces activity Walk 10 feet on uneven surfaces activity did not occur: Safety/medical concerns      Stairs   Assist level: Minimal Assistance - Patient > 75% Stairs assistive device: 2 hand rails Max number of stairs: 8 (6")  Walk up/down 1 step activity   Walk up/down 1 step (curb) assist level: Minimal Assistance - Patient > 75% Walk up/down 1 step or curb assistive device: 2 hand rails  Walk up/down 4 steps activity   Walk up/down 4 steps assist level: Minimal Assistance - Patient > 75% Walk up/down 4 steps assistive device: 2 hand rails  Walk up/down 12 steps activity Walk up/down 12 steps activity did not occur: Safety/medical concerns      Pick up small objects from floor Pick up  small object from the floor (from standing position) activity did not occur: Safety/medical concerns      Wheelchair Is the patient using a wheelchair?: No          Wheel 50 feet with 2 turns activity      Wheel 150 feet activity        Refer to Care Plan for Long Term Goals  SHORT TERM GOAL WEEK 1 PT Short Term Goal 1 (Week 1): =LTG due to ELOS  Recommendations for other services: None   Skilled Therapeutic Intervention Evaluation completed (see details above and below) with education on PT POC and goals and individual treatment initiated with focus on functional transfer and gait assessment. Pt received seated in bed, agreeable to PT session. No complaints of pain at rest, does have onset of rib pain with mobility. Pt requests pain medication from nursing at end of session. Supine to sit with min A for trunk elevation due to rib pain. Sit to stand and stand pivot transfer with min A and no AD. Ambulation x 200 ft with RW and min A for balance with some path deviation and trunk flexion noted. Car transfer with min A with use of RW and cues for safe transfer technique. Ascend/descend 8 x 6" stairs with 2 handrails and min A for balance, step-to gait pattern. Pt requests to return to bed at end of session due to fatigue. Sit to supine min A for RLE management. Pt left seated in bed with needs in reach, bed alarm in place.  Mobility Bed Mobility Bed Mobility: Rolling Right;Right Sidelying to Sit;Supine to Sit Rolling Right: Minimal Assistance - Patient > 75% Rolling Left: Minimal Assistance - Patient > 75% Right Sidelying to Sit: Minimal Assistance - Patient > 75% Supine to Sit: Minimal Assistance - Patient > 75% Sit to Supine: Minimal Assistance - Patient > 75% Transfers Transfers: Sit to Stand;Stand to Sit;Stand Pivot Transfers Sit to Stand: Minimal Assistance - Patient > 75% Stand to Sit: Minimal Assistance - Patient > 75% Stand Pivot Transfers: Minimal Assistance - Patient >  75% Stand Pivot Transfer Details: Verbal cues for safe use of DME/AE;Verbal cues for technique;Verbal cues for precautions/safety;Verbal cues for sequencing Transfer (Assistive device): Rolling walker Locomotion  Gait Gait Distance (Feet): 200 Feet Assistive device: Rolling walker Gait Gait Pattern: Impaired Gait velocity: decreased Stairs / Additional Locomotion Stairs: Yes Stairs Assistance: Minimal Assistance - Patient > 75% Stair Management Technique: Two rails;Step to pattern Number of Stairs: 8 Height of Stairs: 6 Wheelchair Mobility Wheelchair Mobility: No   Discharge Criteria: Patient will be discharged from PT if patient refuses treatment 3 consecutive times without medical reason, if treatment goals not met, if there is a change in medical status, if patient makes no progress towards goals or if patient is discharged from hospital.  The above assessment, treatment plan, treatment alternatives and goals were discussed and mutually agreed upon: by patient   Excell Seltzer, PT, DPT, CSRS 07/17/2021, 12:53 PM

## 2021-07-17 NOTE — Progress Notes (Signed)
Pt refused suppository feels like she hasn't gone because isn't eating much. She would like to wait and see if she goes before taking suppository. Pt educated on importance of suppository, and encouraging pt to eat more during meals.   Dayna Ramus

## 2021-07-17 NOTE — Plan of Care (Signed)
°  Problem: RH Balance Goal: LTG Patient will maintain dynamic standing with ADLs (OT) Description: LTG:  Patient will maintain dynamic standing balance with assist during activities of daily living (OT)  Flowsheets (Taken 07/17/2021 1256) LTG: Pt will maintain dynamic standing balance during ADLs with: Supervision/Verbal cueing   Problem: Sit to Stand Goal: LTG:  Patient will perform sit to stand in prep for activites of daily living with assistance level (OT) Description: LTG:  Patient will perform sit to stand in prep for activites of daily living with assistance level (OT) Flowsheets (Taken 07/17/2021 1256) LTG: PT will perform sit to stand in prep for activites of daily living with assistance level: Supervision/Verbal cueing   Problem: RH Eating Goal: LTG Patient will perform eating w/assist, cues/equip (OT) Description: LTG: Patient will perform eating with assist, with/without cues using equipment (OT) Flowsheets (Taken 07/17/2021 1256) LTG: Pt will perform eating with assistance level of: Set up assist    Problem: RH Grooming Goal: LTG Patient will perform grooming w/assist,cues/equip (OT) Description: LTG: Patient will perform grooming with assist, with/without cues using equipment (OT) Flowsheets (Taken 07/17/2021 1256) LTG: Pt will perform grooming with assistance level of: Set up assist    Problem: RH Bathing Goal: LTG Patient will bathe all body parts with assist levels (OT) Description: LTG: Patient will bathe all body parts with assist levels (OT) Flowsheets (Taken 07/17/2021 1256) LTG: Pt will perform bathing with assistance level/cueing: Supervision/Verbal cueing   Problem: RH Dressing Goal: LTG Patient will perform upper body dressing (OT) Description: LTG Patient will perform upper body dressing with assist, with/without cues (OT). Flowsheets (Taken 07/17/2021 1256) LTG: Pt will perform upper body dressing with assistance level of: Set up assist Goal: LTG Patient  will perform lower body dressing w/assist (OT) Description: LTG: Patient will perform lower body dressing with assist, with/without cues in positioning using equipment (OT) Flowsheets (Taken 07/17/2021 1256) LTG: Pt will perform lower body dressing with assistance level of: Supervision/Verbal cueing   Problem: RH Toileting Goal: LTG Patient will perform toileting task (3/3 steps) with assistance level (OT) Description: LTG: Patient will perform toileting task (3/3 steps) with assistance level (OT)  Flowsheets (Taken 07/17/2021 1256) LTG: Pt will perform toileting task (3/3 steps) with assistance level: Supervision/Verbal cueing   Problem: RH Toilet Transfers Goal: LTG Patient will perform toilet transfers w/assist (OT) Description: LTG: Patient will perform toilet transfers with assist, with/without cues using equipment (OT) Flowsheets (Taken 07/17/2021 1256) LTG: Pt will perform toilet transfers with assistance level of: Supervision/Verbal cueing   Problem: RH Tub/Shower Transfers Goal: LTG Patient will perform tub/shower transfers w/assist (OT) Description: LTG: Patient will perform tub/shower transfers with assist, with/without cues using equipment (OT) Flowsheets (Taken 07/17/2021 1256) LTG: Pt will perform tub/shower stall transfers with assistance level of: Supervision/Verbal cueing

## 2021-07-17 NOTE — Progress Notes (Signed)
Inpatient Rehabilitation  Patient information reviewed and entered into eRehab system by Felicha Frayne M. Renette Hsu, M.A., CCC/SLP, PPS Coordinator.  Information including medical coding, functional ability and quality indicators will be reviewed and updated through discharge.    

## 2021-07-17 NOTE — Progress Notes (Signed)
Patient ID: Stacey Dorsey, female   DOB: 05-27-1933, 87 y.o.   MRN: [id number] S: Feels well and now in CIR. O:BP (!) 151/57    Pulse 66    Temp 97.8 F (36.6 C) (Oral)    Resp 17    Ht 5\' 9"  (1.753 m)    Wt 64.2 kg    SpO2 98%    BMI 20.90 kg/m   Intake/Output Summary (Last 24 hours) at 07/17/2021 1024 Last data filed at 07/17/2021 0820 Gross per 24 hour  Intake 40 ml  Output 150 ml  Net -110 ml   Intake/Output: I/O last 3 completed shifts: In: -  Out: 150 [Urine:150]  Intake/Output this shift:  Total I/O In: 40 [P.O.:40] Out: -  Weight change:  Gen: NAD CVS: RRR Resp:CTA3 Abd: +BS, soft, NT/Nd Ext: no edema, LUE AVG +T/B  Recent Labs  Lab 07/11/21 0840 07/16/21 0313  NA 140 137  K 4.1 4.7  CL 104 99  CO2 27 24  GLUCOSE 95 115*  BUN 37* 54*  CREATININE 6.45* 7.50*  ALBUMIN 2.8*   2.8* 2.7*  CALCIUM 9.0 8.7*  PHOS 4.2 6.1*  AST 24  --   ALT 16  --    Liver Function Tests: Recent Labs  Lab 07/11/21 0840 07/16/21 0313  AST 24  --   ALT 16  --   ALKPHOS 75  --   BILITOT 0.5  --   PROT 5.9*  --   ALBUMIN 2.8*   2.8* 2.7*   No results for input(s): LIPASE, AMYLASE in the last 168 hours. No results for input(s): AMMONIA in the last 168 hours. CBC: Recent Labs  Lab 07/11/21 0303 07/16/21 0313  WBC 7.7 8.6  HGB 10.1* 9.1*  HCT 31.6* 28.8*  MCV 91.1 90.6  PLT 192 260   Cardiac Enzymes: No results for input(s): CKTOTAL, CKMB, CKMBINDEX, TROPONINI in the last 168 hours. CBG: Recent Labs  Lab 07/16/21 0639 07/16/21 1121 07/16/21 1549 07/16/21 2102 07/17/21 0617  GLUCAP 124* 122* 134* 187* 106*    Iron Studies: No results for input(s): IRON, TIBC, TRANSFERRIN, FERRITIN in the last 72 hours. Studies/Results: No results found.  acetaminophen  650 mg Oral TID   allopurinol  100 mg Oral Daily   aspirin  325 mg Oral Daily   bisacodyl  10 mg Rectal QODAY   carvedilol  25 mg Oral BID WC   cloNIDine  0.1 mg Oral BID   clopidogrel  75 mg Oral Daily    feeding supplement (NEPRO CARB STEADY)  237 mL Oral BID   heparin  5,000 Units Subcutaneous Q8H   lidocaine  2 patch Transdermal Q24H   lidocaine-prilocaine  1 application Topical Once per day on Mon Wed Fri   mometasone-formoterol  2 puff Inhalation BID   multivitamin  1 tablet Oral QHS   NIFEdipine  60 mg Oral QHS   pantoprazole  40 mg Oral Daily   rOPINIRole  0.5 mg Oral QHS   senna-docusate  2 tablet Oral BID   simvastatin  20 mg Oral QHS    BMET    Component Value Date/Time   NA 137 07/16/2021 0313   K 4.7 07/16/2021 0313   CL 99 07/16/2021 0313   CO2 24 07/16/2021 0313   GLUCOSE 115 (H) 07/16/2021 0313   BUN 54 (H) 07/16/2021 0313   CREATININE 7.50 (H) 07/16/2021 0313   CALCIUM 8.7 (L) 07/16/2021 0313   GFRNONAA 5 (L) 07/16/2021 0313   CBC  Component Value Date/Time   WBC 8.6 07/16/2021 0313   RBC 3.18 (L) 07/16/2021 0313   HGB 9.1 (L) 07/16/2021 0313   HCT 28.8 (L) 07/16/2021 0313   PLT 260 07/16/2021 0313   MCV 90.6 07/16/2021 0313   MCH 28.6 07/16/2021 0313   MCHC 31.6 07/16/2021 0313   RDW 17.2 (H) 07/16/2021 0313   LYMPHSABS 5.1 (H) 12/31/2020 2334   MONOABS 1.1 (H) 12/31/2020 2334   EOSABS 0.4 12/31/2020 2334   BASOSABS 0.1 12/31/2020 2334    Dialysis Orders:  [facility]  3.5h   64.5kg  2/2 bath  400/500   LUA AVG  Hep none  Sensipar 30 ug tiw po Hectorol 5 ug tiw IV Mircera 75 ug q 2wks , last 12/01  Venofer 50 mg q. Weekly       Assessment/Plan: Acute CVA - L MCA by CT and MRI, +aphasia, on DAPT. Microembolic infarctions. EP consulted for loop recorder.  PEA arrest - Out of hospital, per primary.   ESRD - TTS HD. Will keep on outpatient schedule. Rib fractures/ chest pain - due to CPR and still complaining of chest pain. Pneumonia- improved, completed course of Rocephin/ azithro IV here HTN/vol - BP's high. Coreg/ procardia as at home, clonidine added. 1kg under dry. Increased procardia to 60 qd. BP's better today.  Anemia-  Hb 9-  10. Weekly Aranesp 60ug on thursdays, last 12/15.  CKD-BMD- Ca ok, P ok  Donetta Potts, MD [employer] [phone]

## 2021-07-17 NOTE — Plan of Care (Signed)
°  Problem: RH Balance Goal: LTG Patient will maintain dynamic standing balance (PT) Description: LTG:  Patient will maintain dynamic standing balance with assistance during mobility activities (PT) Flowsheets (Taken 07/17/2021 1259) LTG: Pt will maintain dynamic standing balance during mobility activities with:: Supervision/Verbal cueing   Problem: Sit to Stand Goal: LTG:  Patient will perform sit to stand with assistance level (PT) Description: LTG:  Patient will perform sit to stand with assistance level (PT) Flowsheets (Taken 07/17/2021 1259) LTG: PT will perform sit to stand in preparation for functional mobility with assistance level: Supervision/Verbal cueing   Problem: RH Bed Mobility Goal: LTG Patient will perform bed mobility with assist (PT) Description: LTG: Patient will perform bed mobility with assistance, with/without cues (PT). Flowsheets (Taken 07/17/2021 1259) LTG: Pt will perform bed mobility with assistance level of: Independent with assistive device    Problem: RH Bed to Chair Transfers Goal: LTG Patient will perform bed/chair transfers w/assist (PT) Description: LTG: Patient will perform bed to chair transfers with assistance (PT). Flowsheets (Taken 07/17/2021 1259) LTG: Pt will perform Bed to Chair Transfers with assistance level: Supervision/Verbal cueing   Problem: RH Car Transfers Goal: LTG Patient will perform car transfers with assist (PT) Description: LTG: Patient will perform car transfers with assistance (PT). Flowsheets (Taken 07/17/2021 1259) LTG: Pt will perform car transfers with assist:: Supervision/Verbal cueing   Problem: RH Ambulation Goal: LTG Patient will ambulate in controlled environment (PT) Description: LTG: Patient will ambulate in a controlled environment, # of feet with assistance (PT). Flowsheets (Taken 07/17/2021 1259) LTG: Pt will ambulate in controlled environ  assist needed:: Supervision/Verbal cueing LTG: Ambulation distance in  controlled environment: 150 ft with LRAD Goal: LTG Patient will ambulate in home environment (PT) Description: LTG: Patient will ambulate in home environment, # of feet with assistance (PT). Flowsheets (Taken 07/17/2021 1259) LTG: Pt will ambulate in home environ  assist needed:: Supervision/Verbal cueing LTG: Ambulation distance in home environment: 75 ft with LRAD   Problem: RH Stairs Goal: LTG Patient will ambulate up and down stairs w/assist (PT) Description: LTG: Patient will ambulate up and down # of stairs with assistance (PT) Flowsheets (Taken 07/17/2021 1259) LTG: Pt will ambulate up/down stairs assist needed:: Minimal Assistance - Patient > 75% LTG: Pt will  ambulate up and down number of stairs: 2 stairs with no handrails

## 2021-07-17 NOTE — Progress Notes (Signed)
Inpatient Rehabilitation Admission Medication Review by a Pharmacist  A complete drug regimen review was completed for this patient to identify any potential clinically significant medication issues.  High Risk Drug Classes Is patient taking? Indication by Medication  Antipsychotic Yes Compazine for N/V  Anticoagulant Yes Sq heparin for VTE ppx  Antibiotic No   Opioid No   Antiplatelet Yes   Hypoglycemics/insulin No   Vasoactive Medication Yes Coreg, clonidine, Nifedipine for BP control  Chemotherapy No   Other No Requip for RLS     Type of Medication Issue Identified Description of Issue Recommendation(s)  Drug Interaction(s) (clinically significant)     Duplicate Therapy     Allergy     No Medication Administration End Date     Incorrect Dose     Additional Drug Therapy Needed     Significant med changes from prior encounter (inform family/care partners about these prior to discharge).    Other       Clinically significant medication issues were identified that warrant physician communication and completion of prescribed/recommended actions by midnight of the next day:  No  Pharmacist comments: None  Time spent performing this drug regimen review (minutes):  20 minutes   Tad Moore 07/17/2021 8:08 AM

## 2021-07-17 NOTE — Evaluation (Signed)
Speech Language Pathology Assessment and Plan  Patient Details  Name: Stacey Dorsey MRN: [id number] Date of Birth: 1933-06-19  SLP Diagnosis: Aphasia;Cognitive Impairments  Rehab Potential: Good ELOS: 7-10 days    Today's Date: 07/17/2021 SLP Individual Time: 1005-1100 SLP Individual Time Calculation (min): 63 min   Hospital Problem: Principal Problem:   Acute ischemic left MCA stroke ([place])  Past Medical History:  Past Medical History:  Diagnosis Date   Chronic kidney disease    COPD (chronic obstructive pulmonary disease) ([place])    Diabetes mellitus without complication ([place])    Hyperlipidemia    Hypertension    Past Surgical History:  Past Surgical History:  Procedure Laterality Date   ABDOMINAL HYSTERECTOMY     AV FISTULA PLACEMENT     IR REMOVAL TUN CV CATH W/O FL  09/07/2020    Assessment / Plan / Recommendation Pt is 87 year old female who was in her usual state of health on the morning of July 04, 2021 when she arose and was preparing to go to hemodialysis.  She developed shortness of breath and subsequent PEA arrest.  EMS arrived and she received 15 minutes of CPR, ROSC obtained. [place] airway was placed in the field and was exchanged for an endotracheal tube at [facility] emergency department.  Work-up included CTA of the chest which showed no PE, but multiple bilateral rib fractures. She exhibited delirium and sundowning on 12/11 and suspected narcotic side-effect. On 12/12, rapid response was called due to confusion and expressive aphasia.  No extremity weakness. CT of the head showed encephalomalacia from old anterior MCA territory stroke. MRI of the brain was performed showing multiple infarcts suspected to be due to micro emboli during resuscitation.  MRA of the head revealed moderate to severe narrowing in the left supraclinoid internal carotid artery.  There was decreased perfusion in the left distal MCA branches without focal MCA stenosis or occlusion.  The  patient requires inpatient medicine and rehabilitation evaluations and services for ongoing dysfunction secondary to acute encephalopathy. Patient transferred to CIR on 07/16/2021 .   Clinical Impression Patient presents with a mild expressive aphasia, mild receptive language impairment for complex level information, and mild cognitive impairment. Expressive language impairment impacting her confrontational naming, convergent naming, divergent naming. She was able to describe object function without significant difficulty and did demonstrate awareness to majority of language errors, phonemic errors. She had difficulty with complex level yes/no questions and would seem to become mixed up in what was being asked, transposing parts of question (SLP asking if chin above neck and patient saying, "is my neck above my chin?", requiring cues to demonstrate understanding). She was able to communicate at conversational level fluently, talking about her family, her work history, etc. She did appear somewhat anxious,, especially at beginning of session when she asked SLP "are you going to send me to the [facility]?" She demonstrated orientation x4 and was able to recall therapist's name from this morning but SLP plans to further assess cognition at complex level in upcoming visits. Patient will benefit from skilled SLP intervention to maximize expressive, receptive and cognitive function prior to discharge.  Skilled Therapeutic Interventions          SLE  SLP Assessment  Patient will need skilled [facility] during CIR admission    Recommendations  Patient destination: Home Follow up Recommendations: Other (comment) (TBD) Equipment Recommended: None recommended by SLP    SLP Frequency 3 to 5 out of 7 days   SLP  Duration  SLP Intensity  SLP Treatment/Interventions 7-10 days  Minumum of 1-2 x/day, 30 to 90 minutes  Cognitive remediation/compensation;Environmental controls;Medication  managment;Speech/Language facilitation;Functional tasks;Patient/family education    Pain Pain Assessment Pain Scale: Faces Faces Pain Scale: Hurts a little bit Pain Type: Acute pain Pain Location: Rib cage Pain Orientation: Right Pain Descriptors / Indicators: Discomfort Pain Onset: With Activity Pain Intervention(s): Other (Comment) (no intervention needed) Multiple Pain Sites: No  Prior Functioning Cognitive/Linguistic Baseline: Within functional limits Type of Home: House  Lives With: Family Available Help at Discharge: Family Education: completed HS and LPN school Vocation: Retired (retired Corporate treasurer)  SLP Evaluation Cognition Overall Cognitive Status: Impaired/Different from baseline Arousal/Alertness: Awake/alert Orientation Level: Oriented X4 Year: 2022 Month: December Day of Week: Incorrect Attention: Selective Focused Attention: Appears intact Sustained Attention: Appears intact Selective Attention: Impaired Selective Attention Impairment: Verbal complex Memory: Impaired Memory Impairment: Decreased recall of new information Awareness Impairment: Emergent impairment Problem Solving: Impaired Problem Solving Impairment: Verbal complex;Functional complex Safety/Judgment: Impaired  Comprehension Auditory Comprehension Overall Auditory Comprehension: Impaired Yes/No Questions: Within Functional Limits Basic Biographical Questions: 76-100% accurate Basic Immediate Environment Questions: 75-100% accurate Complex Questions: 75-100% accurate Commands: Within Functional Limits Interfering Components: Anxiety;Attention EffectiveTechniques: Extra processing time Visual Recognition/Discrimination Discrimination: Within Function Limits Reading Comprehension Reading Status: Within funtional limits Expression Expression Primary Mode of Expression: Verbal Verbal Expression Overall Verbal Expression: Impaired Initiation: No impairment Level of Generative/Spontaneous  Verbalization: Conversation;Sentence Repetition: No impairment Naming: Impairment Confrontation: Impaired Convergent: 50-74% accurate Divergent: 50-74% accurate Verbal Errors: Phonemic paraphasias;Aware of errors Pragmatics: No impairment Effective Techniques: Open ended questions;Semantic cues Non-Verbal Means of Communication: Not applicable Written Expression Dominant Hand: Right Written Expression: Not tested Oral Motor Oral Motor/Sensory Function Overall Oral Motor/Sensory Function: Within functional limits Motor Speech Overall Motor Speech: Appears within functional limits for tasks assessed  Care Tool Care Tool Cognition Ability to hear (with hearing aid or hearing appliances if normally used Ability to hear (with hearing aid or hearing appliances if normally used): 0. Adequate - no difficulty in normal conservation, social interaction, listening to TV   Expression of Ideas and Wants Expression of Ideas and Wants: 3. Some difficulty - exhibits some difficulty with expressing needs and ideas (e.g, some words or finishing thoughts) or speech is not clear   Understanding Verbal and Non-Verbal Content Understanding Verbal and Non-Verbal Content: 3. Usually understands - understands most conversations, but misses some part/intent of message. Requires cues at times to understand  Memory/Recall Ability Memory/Recall Ability : Current season;Location of own room;Staff names and faces;That he or she is in a hospital/hospital unit    Short Term Goals: Week 1: SLP Short Term Goal 1 (Week 1): Patient will be able to name at least 10 items in a given category with minA verbal cues. SLP Short Term Goal 2 (Week 1): Patient will be able to summarize after reading or listening to short paragraph length story, with minA verbal cues. SLP Short Term Goal 3 (Week 1): Patient will answer complex yes/no questions with 90% accuracy and minA verbal cues. SLP Short Term Goal 4 (Week 1): Patient will  participate in assessment of complex level cognitive abilities. SLP Short Term Goal 5 (Week 1): Patient will be able to recall and utilize learned strategies in PT/OT and ST sessions with minA verbal, visual cues. SLP Short Term Goal 6 (Week 1): Patient will be able to name less common object pictures/photos with at least 80% accuracy and minA verbal cues.  Refer to Care Plan for Long  Term Goals  Recommendations for other services: None   Discharge Criteria: Patient will be discharged from SLP if patient refuses treatment 3 consecutive times without medical reason, if treatment goals not met, if there is a change in medical status, if patient makes no progress towards goals or if patient is discharged from hospital.  The above assessment, treatment plan, treatment alternatives and goals were discussed and mutually agreed upon: by patient  Sonia Baller, MA, CCC-SLP Speech Therapy

## 2021-07-17 NOTE — Progress Notes (Signed)
PROGRESS NOTE   Subjective/Complaints:  Pt reports already ate- has been up for awhile.  Rib pain pretty bad. Wants something else to help.   ROS:  Pt denies SOB, abd pain, CP, N/V/C/D, and vision changes   Objective:   No results found. Recent Labs    07/16/21 0313  WBC 8.6  HGB 9.1*  HCT 28.8*  PLT 260   Recent Labs    07/16/21 0313  NA 137  K 4.7  CL 99  CO2 24  GLUCOSE 115*  BUN 54*  CREATININE 7.50*  CALCIUM 8.7*    Intake/Output Summary (Last 24 hours) at 07/17/2021 0834 Last data filed at 07/17/2021 0820 Gross per 24 hour  Intake 40 ml  Output 150 ml  Net -110 ml        Physical Exam: Vital Signs Blood pressure (!) 151/57, pulse 66, temperature 97.8 F (36.6 C), temperature source Oral, resp. rate 17, height 5\' 9"  (1.753 m), weight 64.2 kg, SpO2 98 %.    General: awake, alert, appropriate, sitting up in bed; finished 100% tray; NAD HENT: conjugate gaze; oropharynx moist CV: regular rate; no JVD Pulmonary: CTA B/L; no W/R/R- good air movement GI: soft, NT, ND, (+)BS Psychiatric: appropriate Neurological: Ox3  Musculoskeletal:  TTP over multiple rib fractures- anterior ribs    Comments: No lower extremity pain. Endorses restless legs>>well-controlled with Requip RUE 5-/5; LUE 5/5 RLE_ 5-/5; LLE 5/5  Skin:    General: Skin is warm and dry.     Comments: Heels slightly boggy B/L  IV R upper arms looks good [place] LUE from fistula  Neurological:     Mental Status: She is alert and oriented to person, place, and time.     Comments: Makes eye contact with examiner. Follows commands. Face symmetric, tongue midline. Intact to light touch in all 4 extremities Naming 3/3; but appears to have poor memory; mild aphasia?  Assessment/Plan: 1. Functional deficits which require 3+ hours per day of interdisciplinary therapy in a comprehensive inpatient rehab setting. Physiatrist is providing  close team supervision and 24 hour management of active medical problems listed below. Physiatrist and rehab team continue to assess barriers to discharge/monitor patient progress toward functional and medical goals  Care Tool:  Bathing              Bathing assist       Upper Body Dressing/Undressing Upper body dressing        Upper body assist      Lower Body Dressing/Undressing Lower body dressing            Lower body assist       Toileting Toileting    Toileting assist Assist for toileting: Minimal Assistance - Patient > 75%     Transfers Chair/bed transfer  Transfers assist           Locomotion Ambulation   Ambulation assist              Walk 10 feet activity   Assist           Walk 50 feet activity   Assist           Walk 150  feet activity   Assist           Walk 10 feet on uneven surface  activity   Assist           Wheelchair     Assist               Wheelchair 50 feet with 2 turns activity    Assist            Wheelchair 150 feet activity     Assist          Blood pressure (!) 151/57, pulse 66, temperature 97.8 F (36.6 C), temperature source Oral, resp. rate 17, height 5\' 9"  (1.753 m), weight 64.2 kg, SpO2 98 %.    Medical Problem List and Plan: 1. Functional deficits secondary to anterior left MCA stroke/encephalomalacia further defined by MRI as multiple microembolic infarcts in that distribution; metabolic encephalopathy             -patient may  shower             -ELOS/Goals: 6-9 days supervision to mod I  -first day of CIR- PT, OT and SLP 2.  Antithrombotics: -DVT/anticoagulation:  Pharmaceutical: Heparin             -antiplatelet therapy: DAPT>>Plavix, aspirin 235 mg daily for 3 months then aspirin alone 3. Pain Management: Tylenol,- has rib pain- might need lidoderm patches?  12/21- will order lidoderm patches 2- 8pm-8am and see if needs something  stronger? 4. Mood: LCSW to assess and provide support             -antipsychotic agents: n/a 5. Neuropsych: This patient is capable of making decisions on her own behalf. Consider neuropsych referral  6. Skin/Wound Care: routine skin checks 7. Fluids/Electrolytes/Nutrition: Is and Os and follow-up chemistries.  --Chronic iHD. T/T/S --Tolerating diet. 8. ESRD: TTS schedule  --[facility] 9. Acute on chronic hypoxic respiratory failure/pneumonia: completed ceftriaxone on 12/15.  --Currently normal SaO2 on RA 10: Anemia: [place] D[place] and improving -- Aranesp per nephrology 11: Hypertension: Systolic BP averaging 443-154M. Continue coreg, nifedipine, clonidine  12/21- BP running stable for her- don't want to drop it more, so doesn't have hypotension in HD.  12: Gout: no flare, continue allopurinol 13: GERD: continue Protonix 14: DM2: Hgb A1c = 5.7%.  --Continue CBGs checks, SSI 12/21- 106-134- doing well- con't regimen 15: Grade 1 diastolic dysfunction: monitor  16: Bilateral carotid stenosis: will need annual carotid duplex follow-up 17: Hyperlipidemia: continue simvastatin 18: Rib fractures: aggressive pulmonary hygiene, encourage deep cough; IS.  --Consider outpatient PFTs/pulmonary follow-up 12/21- added lidoderm patches ot help 19. Restless leg syndrome: continue Requip 20 Chronic constipation- will schedule Suppository every other day after dinner.    12/21- will start today.     LOS: 1 days A FACE TO FACE EVALUATION WAS PERFORMED  Stacey Dorsey 07/17/2021, 8:34 AM

## 2021-07-17 NOTE — Progress Notes (Signed)
Occupational Therapy Session Note  Patient Details  Name: Stacey Dorsey MRN: [id number] Date of Birth: January 29, 1933  Today's Date: 07/17/2021 OT Individual Time: 1300-1325 OT Individual Time Calculation (min): 25 min    Short Term Goals: Week 1:  OT Short Term Goal 1 (Week 1): STGs = LTGs 2/2 ELOS at Supervision .  Skilled Therapeutic Interventions/Progress Updates:  Pt resting in bed upon arrival. Pt reports that she is fatigued from earlier therapies. Pt declines OOB activities. OT intervention with focus on discharge planning, safety awareness, and DME requirements. Pt lives with daughter who can provide supervision. Pt frustrated with her language difficulties. Educated on [place] therex. Pt remained in bed with all needs within reach. Bed alarm activated.   Therapy Documentation Precautions:  Precautions Precautions: Fall Precaution Comments: rib fxs (R4, 6-8 and L 6-9th), expressive aphasia Restrictions Weight Bearing Restrictions: No  Pain: Pt denies pain this afternoon    Therapy/Group: Individual Therapy  Leroy Libman 07/17/2021, 1:32 PM

## 2021-07-18 LAB — RENAL FUNCTION PANEL
Albumin: 3 g/dL — ABNORMAL LOW (ref 3.5–5.0)
Anion gap: 11 (ref 5–15)
BUN: 53 mg/dL — ABNORMAL HIGH (ref 8–23)
CO2: 24 mmol/L (ref 22–32)
Calcium: 8.6 mg/dL — ABNORMAL LOW (ref 8.9–10.3)
Chloride: 102 mmol/L (ref 98–111)
Creatinine, Ser: 7.1 mg/dL — ABNORMAL HIGH (ref 0.44–1.00)
GFR, Estimated: 5 mL/min — ABNORMAL LOW (ref >60)
Glucose, Bld: 117 mg/dL — ABNORMAL HIGH (ref 70–99)
Phosphorus: 4.5 mg/dL (ref 2.5–4.6)
Potassium: 4.2 mmol/L (ref 3.5–5.1)
Sodium: 137 mmol/L (ref 135–145)

## 2021-07-18 MED ORDER — ACETAMINOPHEN-CODEINE #3 300-30 MG PO TABS: 1.0000 | ORAL_TABLET | ORAL | Status: DC | PRN

## 2021-07-18 MED ORDER — ACETAMINOPHEN 325 MG PO TABS
650.0000 mg | ORAL_TABLET | Freq: Four times a day (QID) | ORAL | Status: DC | PRN
  Administered 2021-07-19 – 2021-07-25 (×8): 650 mg via ORAL
  Filled 2021-07-18 (×7): qty 2

## 2021-07-18 MED ORDER — FERRIC CITRATE 1 GM 210 MG(FE) PO TABS
210.0000 mg | ORAL_TABLET | Freq: Three times a day (TID) | ORAL | Status: DC
  Administered 2021-07-18 – 2021-07-26 (×23): 210 mg via ORAL
  Filled 2021-07-18 (×25): qty 1

## 2021-07-18 MED ORDER — HYDROXYZINE HCL 10 MG PO TABS
10.0000 mg | ORAL_TABLET | Freq: Three times a day (TID) | ORAL | Status: DC | PRN
  Administered 2021-07-19: 08:00:00 10 mg via ORAL
  Filled 2021-07-18 (×2): qty 1

## 2021-07-18 NOTE — Progress Notes (Signed)
PROGRESS NOTE   Subjective/Complaints:  Pt was givne tramadol yesterday- said it made her feel so restless, she felt like she was "crazy"- like needs ot move.  Per nurse, had anxiety attack- likes the lidoderm patches, but needs some tylenol or something else.    ROS:  Pt denies SOB, abd pain, CP, N/V/C/D, and vision changes   Objective:   No results found. Recent Labs    07/16/21 0313  WBC 8.6  HGB 9.1*  HCT 28.8*  PLT 260   Recent Labs    07/16/21 0313  NA 137  K 4.7  CL 99  CO2 24  GLUCOSE 115*  BUN 54*  CREATININE 7.50*  CALCIUM 8.7*    Intake/Output Summary (Last 24 hours) at 07/18/2021 0957 Last data filed at 07/17/2021 1940 Gross per 24 hour  Intake 60 ml  Output 50 ml  Net 10 ml        Physical Exam: Vital Signs Blood pressure (!) 158/57, pulse 65, temperature 98.3 F (36.8 C), resp. rate 16, height 5\' 9"  (1.753 m), weight 64.3 kg, SpO2 99 %.    General: awake, alert, appropriate, appears anxious; laying on R side in bed; NAD HENT: conjugate gaze; oropharynx moist; dentures in place CV: regular rate; no JVD Pulmonary: CTA B/L; no W/R/R- good air movement GI: soft, NT, ND, (+)BS Psychiatric: appropriate but appears to have new anxiety? restless Neurological: Ox3  Musculoskeletal:  TTP over multiple rib fractures- anterior ribs    Comments: No lower extremity pain. Endorses restless legs>>well-controlled with Requip RUE 5-/5; LUE 5/5 RLE_ 5-/5; LLE 5/5  Skin:    General: Skin is warm and dry.     Comments: Heels slightly boggy B/L  IV R upper arms looks good [place] LUE from fistula  Neurological:     Mental Status: She is alert and oriented to person, place, and time.     Comments: Makes eye contact with examiner. Follows commands. Face symmetric, tongue midline. Intact to light touch in all 4 extremities Naming 3/3; but appears to have poor memory; mild aphasia?   Assessment/Plan: 1. Functional deficits which require 3+ hours per day of interdisciplinary therapy in a comprehensive inpatient rehab setting. Physiatrist is providing close team supervision and 24 hour management of active medical problems listed below. Physiatrist and rehab team continue to assess barriers to discharge/monitor patient progress toward functional and medical goals  Care Tool:  Bathing    Body parts bathed by patient: Right arm, Left arm, Chest, Abdomen, Front perineal area, Right upper leg, Left upper leg, Face, Buttocks   Body parts bathed by helper: Right lower leg, Left lower leg     Bathing assist Assist Level: Moderate Assistance - Patient 50 - 74%     Upper Body Dressing/Undressing Upper body dressing   What is the patient wearing?: Pull over shirt    Upper body assist Assist Level: Minimal Assistance - Patient > 75%    Lower Body Dressing/Undressing Lower body dressing      What is the patient wearing?: Underwear/pull up     Lower body assist Assist for lower body dressing: Moderate Assistance - Patient 50 - 74%  Toileting Toileting    Toileting assist Assist for toileting: Contact Guard/Touching assist     Transfers Chair/bed transfer  Transfers assist     Chair/bed transfer assist level: Minimal Assistance - Patient > 75%     Locomotion Ambulation   Ambulation assist      Assist level: Minimal Assistance - Patient > 75% Assistive device: Walker-rolling Max distance: 200'   Walk 10 feet activity   Assist     Assist level: Minimal Assistance - Patient > 75% Assistive device: Walker-rolling   Walk 50 feet activity   Assist    Assist level: Minimal Assistance - Patient > 75% Assistive device: Walker-rolling    Walk 150 feet activity   Assist    Assist level: Minimal Assistance - Patient > 75% Assistive device: Walker-rolling    Walk 10 feet on uneven surface  activity   Assist Walk 10 feet on uneven  surfaces activity did not occur: Safety/medical concerns         Wheelchair     Assist Is the patient using a wheelchair?: No             Wheelchair 50 feet with 2 turns activity    Assist            Wheelchair 150 feet activity     Assist          Blood pressure (!) 158/57, pulse 65, temperature 98.3 F (36.8 C), resp. rate 16, height 5\' 9"  (1.753 m), weight 64.3 kg, SpO2 99 %.    Medical Problem List and Plan: 1. Functional deficits secondary to anterior left MCA stroke/encephalomalacia further defined by MRI as multiple microembolic infarcts in that distribution; metabolic encephalopathy             -patient may  shower             -ELOS/Goals: 6-9 days supervision to mod I  -Continue CIR- PT, OT and SLP- PT, OT and SLP 2.  Antithrombotics: -DVT/anticoagulation:  Pharmaceutical: Heparin             -antiplatelet therapy: DAPT>>Plavix, aspirin 235 mg daily for 3 months then aspirin alone 3. Pain Management: Tylenol,- has rib pain- might need lidoderm patches?  12/21- will order lidoderm patches 2- 8pm-8am and see if needs something stronger?  12/2- Tramadol was tried- will stop since made her anxious/restless- will start Tylenol PRN (get rid of scheduled) and tylenol #3 prn 4. Mood: LCSW to assess and provide support             -antipsychotic agents: n/a 5. Neuropsych: This patient is capable of making decisions on her own behalf. Consider neuropsych referral  6. Skin/Wound Care: routine skin checks 7. Fluids/Electrolytes/Nutrition: Is and Os and follow-up chemistries.  --Chronic iHD. T/T/S --Tolerating diet. 8. ESRD: TTS schedule  --[facility] as outpatient 9. Acute on chronic hypoxic respiratory failure/pneumonia: completed ceftriaxone on 12/15.  --Currently normal SaO2 on RA 10: Anemia: [place] and improving -- Aranesp per nephrology 11: Hypertension: Systolic BP averaging [id number]. Continue coreg, nifedipine, clonidine  12/22- BP a  little elvated, but don't want to drop her prior to HD.  12: Gout: no flare, continue allopurinol 13: GERD: continue Protonix 14: DM2: Hgb A1c = 5.7%.  --Continue CBGs checks, SSI 12/21- 106-134- doing well- con't regimen 15: Grade 1 diastolic dysfunction: monitor  16: Bilateral carotid stenosis: will need annual carotid duplex follow-up 17: Hyperlipidemia: continue simvastatin 18: Rib fractures: aggressive pulmonary hygiene, encourage deep cough; IS.  --  Consider outpatient PFTs/pulmonary follow-up 12/21- added lidoderm patches ot help 19. Restless leg syndrome: continue Requip 20 Chronic constipation- will schedule Suppository every other day after dinner.    12/21- will start today.   21. Anxiety- new  12/22- likely tramadol- will stop and give vistaril 10 mg q8 hours prn.   LOS: 2 days A FACE TO FACE EVALUATION WAS PERFORMED  Oscar Hank 07/18/2021, 9:57 AM

## 2021-07-18 NOTE — Progress Notes (Signed)
Occupational Therapy Session Note  Patient Details  Name: Stacey Dorsey MRN: [id number] Date of Birth: 20-May-1933  Today's Date: 07/18/2021 OT Individual Time: 7680-8811 OT Individual Time Calculation (min): 25 min  and Today's Date: 07/18/2021 OT Missed Time: 50 Minutes Missed Time Reason: Patient fatigue   Short Term Goals: Week 1:  OT Short Term Goal 1 (Week 1): STGs = LTGs 2/2 ELOS at Supervision  Skilled Therapeutic Interventions/Progress Updates:    Pt resting in bed upon arrival. Pt reported that she had a "rough night" (see nursing note of 12/21) and didn't sleep much because she was afraid to close her eyes. Pt stated she had a nightmare. Pt had not eaten breakfast but wanted to save muffin and bacon. Plastic foot bad obtained for pt to save items. Pt initially agreeable to getting OOB but after sitting EOB and donning pants, pt stated that she couldn't do keep going. Min A for supine>sit EOB and sit<>stand. Mod A for donning pants this morning. Pt missed 50 mins skilled OT services. Pt has dialysis this afternoon but will attempt to see pt later as time allows.   Therapy Documentation Precautions:  Precautions Precautions: Fall Precaution Comments: rib fxs (R4, 6-8 and L 6-9th), expressive aphasia Restrictions Weight Bearing Restrictions: No General: General OT Amount of Missed Time: 50 Minutes Pain: Pt c/o rib discomfort/pain with transitional movements; repositioned   Therapy/Group: Individual Therapy  Leroy Libman 07/18/2021, 9:05 AM

## 2021-07-18 NOTE — Progress Notes (Signed)
Patient ID: Stacey Dorsey, female   DOB: [birth year], 87 y.o.   MRN: [id number] S: She reports she had a terrible night last night after she was given tramadol.  "I had terrible nightmares and felt like I was crazy".  O:BP (!) 158/57    Pulse 65    Temp 98.3 F (36.8 C)    Resp 16    Ht 5\' 9"  (1.753 m)    Wt 64.3 kg    SpO2 99%    BMI 20.93 kg/m   Intake/Output Summary (Last 24 hours) at 07/18/2021 1033 Last data filed at 07/17/2021 1940 Gross per 24 hour  Intake 60 ml  Output 50 ml  Net 10 ml   Intake/Output: I/O last 3 completed shifts: In: 100 [P.O.:100] Out: 200 [Urine:200]  Intake/Output this shift:  No intake/output data recorded. Weight change: 0.3 kg Gen:NAD CVS: RRR Resp:CTA Abd: +BS, soft, NT/ND Ext: no edema, LUE AVG +T/B   Recent Labs  Lab 07/16/21 0313  NA 137  K 4.7  CL 99  CO2 24  GLUCOSE 115*  BUN 54*  CREATININE 7.50*  ALBUMIN 2.7*  CALCIUM 8.7*  PHOS 6.1*   Liver Function Tests: Recent Labs  Lab 07/16/21 0313  ALBUMIN 2.7*   No results for input(s): LIPASE, AMYLASE in the last 168 hours. No results for input(s): AMMONIA in the last 168 hours. CBC: Recent Labs  Lab 07/16/21 0313  WBC 8.6  HGB 9.1*  HCT 28.8*  MCV 90.6  PLT 260   Cardiac Enzymes: No results for input(s): CKTOTAL, CKMB, CKMBINDEX, TROPONINI in the last 168 hours. CBG: Recent Labs  Lab 07/16/21 0639 07/16/21 1121 07/16/21 1549 07/16/21 2102 07/17/21 0617  GLUCAP 124* 122* 134* 187* 106*    Iron Studies: No results for input(s): IRON, TIBC, TRANSFERRIN, FERRITIN in the last 72 hours. Studies/Results: No results found.  allopurinol  100 mg Oral Daily   aspirin  325 mg Oral Daily   bisacodyl  10 mg Rectal QODAY   carvedilol  25 mg Oral BID WC   cloNIDine  0.1 mg Oral BID   clopidogrel  75 mg Oral Daily   feeding supplement (NEPRO CARB STEADY)  237 mL Oral BID   heparin  5,000 Units Subcutaneous Q8H   lidocaine  2 patch Transdermal Q24H   lidocaine-prilocaine   1 application Topical Once per day on Mon Wed Fri   mometasone-formoterol  2 puff Inhalation BID   multivitamin  1 tablet Oral QHS   NIFEdipine  60 mg Oral QHS   pantoprazole  40 mg Oral Daily   rOPINIRole  0.5 mg Oral QHS   senna-docusate  2 tablet Oral BID   simvastatin  20 mg Oral QHS    BMET    Component Value Date/Time   NA 137 07/16/2021 0313   K 4.7 07/16/2021 0313   CL 99 07/16/2021 0313   CO2 24 07/16/2021 0313   GLUCOSE 115 (H) 07/16/2021 0313   BUN 54 (H) 07/16/2021 0313   CREATININE 7.50 (H) 07/16/2021 0313   CALCIUM 8.7 (L) 07/16/2021 0313   GFRNONAA 5 (L) 07/16/2021 0313   CBC    Component Value Date/Time   WBC 8.6 07/16/2021 0313   RBC 3.18 (L) 07/16/2021 0313   HGB 9.1 (L) 07/16/2021 0313   HCT 28.8 (L) 07/16/2021 0313   PLT 260 07/16/2021 0313   MCV 90.6 07/16/2021 0313   MCH 28.6 07/16/2021 0313   MCHC 31.6 07/16/2021 0313   RDW 17.2 (H)  07/16/2021 0313   LYMPHSABS 5.1 (H) 12/31/2020 2334   MONOABS 1.1 (H) 12/31/2020 2334   EOSABS 0.4 12/31/2020 2334   BASOSABS 0.1 12/31/2020 2334    Dialysis Orders:  [place] [facility]  3.5h   64.5kg  2/2 bath  400/500   LUA AVG  Hep none  Sensipar 30 ug tiw po Hectorol 5 ug tiw IV Mircera 75 ug q 2wks , last 12/01  Venofer 50 mg q. Weekly       Assessment/Plan: Acute CVA - L MCA by CT and MRI, +aphasia, on DAPT. Microembolic infarctions. EP consulted for loop recorder.  Now in CIR. PEA arrest - Out of hospital, per primary.   ESRD - TTS HD. Will keep on outpatient schedule. Rib fractures/ chest pain - due to CPR and still complaining of chest pain. Pneumonia- improved, completed course of Rocephin/ azithro IV here HTN/vol - BP's high. Coreg/ procardia as at home, clonidine added. 1kg under dry. Increased procardia to 60 qd. BP's better today.  Anemia-  Hb 9- 10. Weekly Aranesp 60ug on thursdays, last 12/15.  CKD-BMD- Ca ok, P rising, will resume [place] and follow. AMS with tramadol.  Avoid in the future.    Donetta Potts, MD [employer] [phone]

## 2021-07-18 NOTE — Progress Notes (Signed)
Speech Language Pathology Daily Session Note  Patient Details  Name: Stacey Dorsey MRN: [id number] Date of Birth: Dec 10, 1932  Today's Date: 07/18/2021 SLP Individual Time: 1050-1135 SLP Individual Time Calculation (min): 45 min  Short Term Goals: Week 1: SLP Short Term Goal 1 (Week 1): Patient will be able to name at least 10 items in a given category with minA verbal cues. SLP Short Term Goal 2 (Week 1): Patient will be able to summarize after reading or listening to short paragraph length story, with minA verbal cues. SLP Short Term Goal 3 (Week 1): Patient will answer complex yes/no questions with 90% accuracy and minA verbal cues. SLP Short Term Goal 4 (Week 1): Patient will participate in assessment of complex level cognitive abilities. SLP Short Term Goal 5 (Week 1): Patient will be able to recall and utilize learned strategies in PT/OT and ST sessions with minA verbal, visual cues. SLP Short Term Goal 6 (Week 1): Patient will be able to name less common object pictures/photos with at least 80% accuracy and minA verbal cues.  Skilled Therapeutic Interventions: Skilled treatment session focused on cognitive goals. SLP facilitated session by administering subtests of the Cognistat. Patient scored mild impairment in orientation, attention, calculations and short-term memory. Patient scored [facility] for reasoning and judgement. SLP provided education regarding current cognitive deficits. Patient verbalized understanding and wants to focus on recall, especially with names of family members. Patient requested to use the bathroom and was continent of bladder. Min verbal cues were needed for safety with the RW. Patient left upright in bed with alarm on and all needs within reach. Continue with current plan of care.      Pain Pain Assessment Pain Scale: 0-10 Pain Score: 0-No pain  Therapy/Group: Individual Therapy  Taimur Fier 07/18/2021, 3:22 PM

## 2021-07-18 NOTE — Progress Notes (Signed)
Physical Therapy Session Note  Patient Details  Name: Stacey Dorsey MRN: [id number] Date of Birth: [birth year]  Today's Date: 07/18/2021 PT Individual Time: 0930-1030 PT Individual Time Calculation (min): 60 min   Short Term Goals: Week 1:  PT Short Term Goal 1 (Week 1): =LTG due to ELOS  Skilled Therapeutic Interventions/Progress Updates: Pt presented in bed agreeable to therapy. Pt states some pain in ribs, has Lidocaine patch on ribs. Pt performed supine to sit with supervision, increased time and use of bed features. Performed Sit to stand and ambulated to MW nsg station >182f with RW and CGA. Pt transported remaining distance to day room and participated in toe taps to 4in step with and without AD for dynamic balance. Pt also performed weaving through cones with RW performed with CGA. Pt indicated mild dizziness after weaving through cones which resolved with seated rest. Pt also ambulated stepping over hurdles. Pt was able to manage RW and CGA over sticks however with increased pain. Pt did require min cues when stepping over mini hurdles due to poor sequencing. Pt then ambulated additional ~1577faround nsg station with RW for general conditioning. Pt indicated decreased use of IS and pain with coughing despite use of pillow for splinting. PTA encouraged use of IS for increased lung capacity with pt verbalizing understanding. Pt remained in w/c in day room to listen to holiday performance. Pt left with staff present and current needs met.      Therapy Documentation Precautions:  Precautions Precautions: Fall Precaution Comments: rib fxs (R4, 6-8 and L 6-9th), expressive aphasia Restrictions Weight Bearing Restrictions: No General:   Vital Signs: Therapy Vitals Temp: 98 F (36.7 C) Temp Source: Oral Pulse Rate: 72 Resp: 18 BP: (!) 143/57 Patient Position (if appropriate): Lying Oxygen Therapy SpO2: 98 % O2 Device: Nasal Cannula O2 Flow Rate (L/min): 3 L/min Patient Activity  (if Appropriate): In bed Pain: Pain Assessment Pain Scale: 0-10 Pain Score: 0-No pain Mobility:   Locomotion :    Trunk/Postural Assessment :    Balance:   Exercises:   Other Treatments:      Therapy/Group: Individual Therapy  George Alcantar 07/18/2021, 4:00 PM

## 2021-07-19 MED ORDER — DARBEPOETIN ALFA 60 MCG/0.3ML IJ SOSY: 60.0000 ug | PREFILLED_SYRINGE | INTRAMUSCULAR | Status: DC

## 2021-07-19 NOTE — Progress Notes (Signed)
Patient ID: Stacey Dorsey, female   DOB: 1932-10-21, 87 y.o.   MRN: [id number] S: no acute events, tolerated hd yesterday net UF 1L. No other complaints. O:BP (!) 147/55 (BP Location: Right Arm)    Pulse 70    Temp 98.7 F (37.1 C) (Oral)    Resp 16    Ht 5\' 9"  (1.753 m)    Wt 60.6 kg    SpO2 97%    BMI 19.73 kg/m   Intake/Output Summary (Last 24 hours) at 07/19/2021 1037 Last data filed at 07/19/2021 0918 Gross per 24 hour  Intake 238 ml  Output 1000 ml  Net -762 ml   Intake/Output: I/O last 3 completed shifts: In: 120 [P.O.:120] Out: 1050 [Urine:50; Other:1000]  Intake/Output this shift:  Total I/O In: 118 [P.O.:118] Out: -  Weight change: -3.7 kg Gen:NAD CVS: RRR Resp:CTA Abd: +BS, soft, NT/ND Ext: no edema, LUE AVG +T/B   Recent Labs  Lab 07/16/21 0313 07/18/21 1400  NA 137 137  K 4.7 4.2  CL 99 102  CO2 24 24  GLUCOSE 115* 117*  BUN 54* 53*  CREATININE 7.50* 7.10*  ALBUMIN 2.7* 3.0*  CALCIUM 8.7* 8.6*  PHOS 6.1* 4.5   Liver Function Tests: Recent Labs  Lab 07/16/21 0313 07/18/21 1400  ALBUMIN 2.7* 3.0*   No results for input(s): LIPASE, AMYLASE in the last 168 hours. No results for input(s): AMMONIA in the last 168 hours. CBC: Recent Labs  Lab 07/16/21 0313  WBC 8.6  HGB 9.1*  HCT 28.8*  MCV 90.6  PLT 260   Cardiac Enzymes: No results for input(s): CKTOTAL, CKMB, CKMBINDEX, TROPONINI in the last 168 hours. CBG: Recent Labs  Lab 07/16/21 0639 07/16/21 1121 07/16/21 1549 07/16/21 2102 07/17/21 0617  GLUCAP 124* 122* 134* 187* 106*    Iron Studies: No results for input(s): IRON, TIBC, TRANSFERRIN, FERRITIN in the last 72 hours. Studies/Results: No results found.  allopurinol  100 mg Oral Daily   aspirin  325 mg Oral Daily   bisacodyl  10 mg Rectal QODAY   carvedilol  25 mg Oral BID WC   cloNIDine  0.1 mg Oral BID   clopidogrel  75 mg Oral Daily   feeding supplement (NEPRO CARB STEADY)  237 mL Oral BID   ferric citrate  210 mg Oral  TID WC   heparin  5,000 Units Subcutaneous Q8H   lidocaine  2 patch Transdermal Q24H   lidocaine-prilocaine  1 application Topical Once per day on Mon Wed Fri   mometasone-formoterol  2 puff Inhalation BID   multivitamin  1 tablet Oral QHS   NIFEdipine  60 mg Oral QHS   pantoprazole  40 mg Oral Daily   rOPINIRole  0.5 mg Oral QHS   senna-docusate  2 tablet Oral BID   simvastatin  20 mg Oral QHS    BMET    Component Value Date/Time   NA 137 07/18/2021 1400   K 4.2 07/18/2021 1400   CL 102 07/18/2021 1400   CO2 24 07/18/2021 1400   GLUCOSE 117 (H) 07/18/2021 1400   BUN 53 (H) 07/18/2021 1400   CREATININE 7.10 (H) 07/18/2021 1400   CALCIUM 8.6 (L) 07/18/2021 1400   GFRNONAA 5 (L) 07/18/2021 1400   CBC    Component Value Date/Time   WBC 8.6 07/16/2021 0313   RBC 3.18 (L) 07/16/2021 0313   HGB 9.1 (L) 07/16/2021 0313   HCT 28.8 (L) 07/16/2021 0313   PLT 260 07/16/2021 0313  MCV 90.6 07/16/2021 0313   MCH 28.6 07/16/2021 0313   MCHC 31.6 07/16/2021 0313   RDW 17.2 (H) 07/16/2021 0313   LYMPHSABS 5.1 (H) 12/31/2020 2334   MONOABS 1.1 (H) 12/31/2020 2334   EOSABS 0.4 12/31/2020 2334   BASOSABS 0.1 12/31/2020 2334    Dialysis Orders:  [facility]  3.5h   64.5kg  2/2 bath  400/500   LUA AVG  Hep none  Sensipar 30 ug tiw po Hectorol 5 ug tiw IV Mircera 75 ug q 2wks , last 12/01  Venofer 50 mg q. Weekly       Assessment/Plan: Acute CVA - L MCA by CT and MRI, +aphasia, on DAPT. Microembolic infarctions. EP consulted for loop recorder.  Now in CIR. PEA arrest - Out of hospital, per primary.   ESRD - TTS HD. Will keep on outpatient schedule. HD tomorrow Rib fractures/ chest pain - due to CPR and still complaining of chest pain. Pneumonia- improved, completed course of Rocephin/ azithro IV here HTN/vol - BP's high. Coreg/ procardia as at home, clonidine added. 1kg under dry. Increased procardia to 60 qd. Uf'ing as tolerated Anemia-  Hb 9- 10. Weekly Aranesp 60ug on  thursdays, last 12/15. Restarted, dose 12/24 CKD-BMD- Ca ok, P rising, resumed auryxia-phos better now AMS with tramadol.  Avoid in the future.   Gean Quint, MD [facility]

## 2021-07-19 NOTE — Progress Notes (Signed)
Physical Therapy Session Note ° °Patient Details  °Name: Stacey Dorsey °MRN: [id number] °Date of Birth: 11/18/1932 ° °Today's Date: 07/19/2021 °PT Individual Time: 1010-1105 °PT Individual Time Calculation (min): 55 min  ° °Short Term Goals: °Week 1:  PT Short Term Goal 1 (Week 1): =LTG due to ELOS ° °Skilled Therapeutic Interventions/Progress Updates: Pt presented in bed sleeping but easily aroused and agreeable to therapy. Pt c/o rib pain but unrateable, rest breaks provided as needed. Pt performed supine to sit with CGA and use of bed features. Pt donned shoes with minA, pt able to place leg in figure four position but diffiuclty placing heel in shoe, per pt uses shoe horn at home. Pt then ambulated with CGA ~110ft down hallway and then transported remaining distance to day room. Pt participated in therapeutic activities with holiday set up. Pt was able to stand to create ornament and encouraged to use both hands to close ornament for standing balance. Pt then ambulated across room and participated in antler toss and corn hole both without AT for dynamic balance. Pt required CGA overall for activities, but did need seated rest breaks due to fatigue. Pt then ambulated ~125ft with RW back towards room and transported remaining distance. Once in room pt performed ambulatory transfer back to bed and was supervision for sit to supine transfer to flat bed. Pt positioned to comfort and left with bed alarm on, call bell within reach and needs met.  °   ° °Therapy Documentation °Precautions:  °Precautions °Precautions: Fall °Precaution Comments: rib fxs (R4, 6-8 and L 6-9th), expressive aphasia °Restrictions °Weight Bearing Restrictions: No °General: °  °Vital Signs: °Therapy Vitals °Temp: 97.9 °F (36.6 °C) °Temp Source: Oral °Pulse Rate: 70 °Resp: 14 °BP: (!) 144/80 °Patient Position (if appropriate): Lying °Oxygen Therapy °SpO2: 97 % °O2 Device: Room Air °Pain: °  °Mobility: °  °Locomotion : °   °Trunk/Postural Assessment  : °   °Balance: °  °Exercises: °  °Other Treatments:   ° ° ° °Therapy/Group: Individual Therapy ° °Rosita DeChalus °07/19/2021, 4:19 PM  °

## 2021-07-19 NOTE — Progress Notes (Signed)
PROGRESS NOTE   Subjective/Complaints:  Pt had no more crazy/anxious episodes- feeling good- was wondering if could go home- explained not for a little while- will d/w therapy.  Had a huge BM and feeling great this AM Patches help soreness from rib fx's-  ROS:  Pt denies SOB, abd pain, CP, N/V/C/D, and vision changes    Objective:   No results found. No results for input(s): WBC, HGB, HCT, PLT in the last 72 hours.  Recent Labs    07/18/21 1400  NA 137  K 4.2  CL 102  CO2 24  GLUCOSE 117*  BUN 53*  CREATININE 7.10*  CALCIUM 8.6*    Intake/Output Summary (Last 24 hours) at 07/19/2021 0957 Last data filed at 07/19/2021 3154 Gross per 24 hour  Intake 238 ml  Output 1000 ml  Net -762 ml        Physical Exam: Vital Signs Blood pressure (!) 147/55, pulse 70, temperature 98.7 F (37.1 C), temperature source Oral, resp. rate 16, height 5\' 9"  (1.753 m), weight 60.6 kg, SpO2 97 %.     General: awake, alert, appropriate, laying supine in bed;  NAD HENT: conjugate gaze; oropharynx moist CV: regular rate; no JVD Pulmonary: CTA B/L; no W/R/R- good air movement GI: soft, NT, ND, (+)BS Psychiatric: appropriate- interactive Neurological: poor memory- alert Musculoskeletal:  TTP over multiple rib fractures- anterior ribs    Comments: No lower extremity pain. Endorses restless legs>>well-controlled with Requip RUE 5-/5; LUE 5/5 RLE_ 5-/5; LLE 5/5  Skin:    General: Skin is warm and dry.     Comments: Heels slightly boggy B/L  IV R upper arms looks good Thrill LUE from fistula  Neurological:     Mental Status: She is alert and oriented to person, place, and time.     Comments: Makes eye contact with examiner. Follows commands. Face symmetric, tongue midline. Intact to light touch in all 4 extremities Naming 3/3; but appears to have poor memory; mild aphasia?  Assessment/Plan: 1. Functional deficits which  require 3+ hours per day of interdisciplinary therapy in a comprehensive inpatient rehab setting. Physiatrist is providing close team supervision and 24 hour management of active medical problems listed below. Physiatrist and rehab team continue to assess barriers to discharge/monitor patient progress toward functional and medical goals  Care Tool:  Bathing    Body parts bathed by patient: Right arm, Left arm, Chest, Abdomen, Front perineal area, Right upper leg, Left upper leg, Face, Buttocks   Body parts bathed by helper: Right lower leg, Left lower leg     Bathing assist Assist Level: Moderate Assistance - Patient 50 - 74%     Upper Body Dressing/Undressing Upper body dressing   What is the patient wearing?: Pull over shirt    Upper body assist Assist Level: Minimal Assistance - Patient > 75%    Lower Body Dressing/Undressing Lower body dressing      What is the patient wearing?: Underwear/pull up, Pants     Lower body assist Assist for lower body dressing: Minimal Assistance - Patient > 75%     Toileting Toileting    Toileting assist Assist for toileting: Supervision/Verbal cueing  Transfers Chair/bed transfer  Transfers assist     Chair/bed transfer assist level: Minimal Assistance - Patient > 75%     Locomotion Ambulation   Ambulation assist      Assist level: Minimal Assistance - Patient > 75% Assistive device: Walker-rolling Max distance: 200'   Walk 10 feet activity   Assist     Assist level: Minimal Assistance - Patient > 75% Assistive device: Walker-rolling   Walk 50 feet activity   Assist    Assist level: Minimal Assistance - Patient > 75% Assistive device: Walker-rolling    Walk 150 feet activity   Assist    Assist level: Minimal Assistance - Patient > 75% Assistive device: Walker-rolling    Walk 10 feet on uneven surface  activity   Assist Walk 10 feet on uneven surfaces activity did not occur: Safety/medical  concerns         Wheelchair     Assist Is the patient using a wheelchair?: No             Wheelchair 50 feet with 2 turns activity    Assist            Wheelchair 150 feet activity     Assist          Blood pressure (!) 147/55, pulse 70, temperature 98.7 F (37.1 C), temperature source Oral, resp. rate 16, height 5\' 9"  (1.753 m), weight 60.6 kg, SpO2 97 %.    Medical Problem List and Plan: 1. Functional deficits secondary to anterior left MCA stroke/encephalomalacia further defined by MRI as multiple microembolic infarcts in that distribution; metabolic encephalopathy             -patient may  shower             -ELOS/Goals: 6-9 days supervision to mod I  -Continue CIR- PT, OT and SLP 2.  Antithrombotics: -DVT/anticoagulation:  Pharmaceutical: Heparin             -antiplatelet therapy: DAPT>>Plavix, aspirin 235 mg daily for 3 months then aspirin alone 3. Pain Management: Tylenol,- has rib pain- might need lidoderm patches?  12/21- will order lidoderm patches 2- 8pm-8am and see if needs something stronger?  12/22- Tramadol was tried- will stop since made her anxious/restless- will start Tylenol PRN (get rid of scheduled) and tylenol #3 prn  12/23- pain better- but still there somewhat- explained with rib fractures, will take months to completely heal.  4. Mood: LCSW to assess and provide support             -antipsychotic agents: n/a 5. Neuropsych: This patient is capable of making decisions on her own behalf. Consider neuropsych referral  6. Skin/Wound Care: routine skin checks 7. Fluids/Electrolytes/Nutrition: Is and Os and follow-up chemistries.  --Chronic iHD. T/T/S --Tolerating diet. 8. ESRD: TTS schedule  --[facility] as outpatient 9. Acute on chronic hypoxic respiratory failure/pneumonia: completed ceftriaxone on 12/15.  --Currently normal SaO2 on RA 10: Anemia: [place] and improving -- Aranesp per nephrology 11: Hypertension: Systolic  BP averaging 240-973Z. Continue coreg, nifedipine, clonidine  12/23- BP slightly elevated- con't regimen since on HD  12: Gout: no flare, continue allopurinol 13: GERD: continue Protonix 14: DM2: Hgb A1c = 5.7%.  --Continue CBGs checks, SSI 12/23- BG's look great- con't regimen 15: Grade 1 diastolic dysfunction: monitor  16: Bilateral carotid stenosis: will need annual carotid duplex follow-up 17: Hyperlipidemia: continue simvastatin 18: Rib fractures: aggressive pulmonary hygiene, encourage deep cough; IS.  --Consider outpatient PFTs/pulmonary  follow-up 12/21- added lidoderm patches ot help 19. Restless leg syndrome: continue Requip 20 Chronic constipation- will schedule Suppository every other day after dinner.    12/21- will start today.   21. Anxiety- new  12/22- likely tramadol- will stop and give vistaril 10 mg q8 hours prn.   12/23- resolved off tramadol.   LOS: 3 days A FACE TO FACE EVALUATION WAS PERFORMED  Stacey Dorsey 07/19/2021, 9:57 AM

## 2021-07-19 NOTE — Progress Notes (Signed)
Occupational Therapy Session Note  Patient Details  Name: Stacey Dorsey MRN: [id number] Date of Birth: April 28, 1933  Today's Date: 07/19/2021 OT Individual Time: 5400-8676 OT Individual Time Calculation (min): 70 min    Short Term Goals: Week 1:  OT Short Term Goal 1 (Week 1): STGs = LTGs 2/2 ELOS at Supervision  Skilled Therapeutic Interventions/Progress Updates:    Pt asleep in bed upon arrival but easily aroused. Pt agreeable to getting OOB and donning clothing. Supine>sit EOB with supervision. After selecting clothing pt requested to use toilet and change underpants. Sit<>stand from EOB with min verbal cues for sequencing and safety. Amb with RW to bathroom with CGA. Pt completed toileting tasks with supervision and changed underpants with supervsion. Pt retured to room to complete dressing. Pt required assistance with threading pants and donning socks. Pt also required assistance with donning sweater 2/2 increase rib pain when reaching overhead. Pt was able to don looser pullover dress without assistance. Pt amb with RW 177'x2 to ADL apartment and returned to room. Pt requested to return to bed to rest before next session. Bed alarm activated. All needs within reach.  Therapy Documentation Precautions:  Precautions Precautions: Fall Precaution Comments: rib fxs (R4, 6-8 and L 6-9th), expressive aphasia Restrictions Weight Bearing Restrictions: No Pain: Pt c/o increase Bil rib discomfort when reaching overhead to don shirt and reaching forward to thread pants; repositioned and emotional support   Therapy/Group: Individual Therapy  Leroy Libman 07/19/2021, 9:26 AM

## 2021-07-19 NOTE — IPOC Note (Signed)
Overall Plan of Care [facility]) Patient Details Name: Stacey Dorsey MRN: [id number] DOB: 1933/02/05  Admitting Diagnosis: Acute ischemic left MCA stroke [facility])  Hospital Problems: Principal Problem:   Acute ischemic left MCA stroke [facility])     Functional Problem List: Nursing Bladder, Endurance, Medication Management, Safety  PT Balance, Endurance, Safety  OT Balance, Cognition, Endurance, Motor, Pain, Safety  SLP Cognition, Safety  TR         Basic ADLs: OT Grooming, Bathing, Dressing, Toileting, Eating     Advanced  ADLs: OT       Transfers: PT Bed Mobility, Bed to Chair, Car, Furniture, Floor  OT Toilet, Tub/Shower     Locomotion: PT Ambulation, Emergency planning/management officer, Stairs     Additional Impairments: OT None  SLP Communication expression, comprehension    TR      Anticipated Outcomes Item Anticipated Outcome  Self Feeding Set up  Swallowing  N/A   Basic self-care  Supervision  Toileting  Supervision   Bathroom Transfers Supervision  Bowel/Bladder  supervision  Transfers  Supervision  Locomotion  Supervision with LRAD  Communication  mod I expressive, receptive  Cognition  mod I mild complex to complex  Pain  n/a  Safety/Judgment  supervision and no falls   Therapy Plan: PT Intensity: Minimum of 1-2 x/day ,45 to 90 minutes PT Frequency: 5 out of 7 days PT Duration Estimated Length of Stay: 7-10 days OT Intensity: Minimum of 1-2 x/day, 45 to 90 minutes OT Frequency: 5 out of 7 days OT Duration/Estimated Length of Stay: 7-10 days SLP Intensity: Minumum of 1-2 x/day, 30 to 90 minutes SLP Frequency: 3 to 5 out of 7 days SLP Duration/Estimated Length of Stay: 7-10 days   Due to the current state of emergency, patients may not be receiving their 3-hours of Medicare-mandated therapy.   Team Interventions: Nursing Interventions Patient/Family Education, Bladder Management, Disease Management/Prevention, Medication Management, Discharge Planning   PT interventions Ambulation/gait training, Balance/vestibular training, Community reintegration, Discharge planning, Disease management/prevention, DME/adaptive equipment instruction, Functional mobility training, Neuromuscular re-education, Pain management, Patient/family education, Stair training, Therapeutic Activities, Therapeutic Exercise, UE/LE Strength taining/ROM, UE/LE Coordination activities  OT Interventions Balance/vestibular training, Discharge planning, Pain management, Self Care/advanced ADL retraining, Therapeutic Activities, UE/LE Coordination activities, Visual/perceptual remediation/compensation, Therapeutic Exercise, Skin care/wound managment, Patient/family education, Disease mangement/prevention, Cognitive remediation/compensation, Functional mobility training, Community reintegration, Engineer, drilling, Neuromuscular re-education, Psychosocial support, UE/LE Strength taining/ROM, Wheelchair propulsion/positioning  SLP Interventions Cognitive remediation/compensation, Environmental controls, Medication managment, Speech/Language facilitation, Functional tasks, Patient/family education  TR Interventions    SW/CM Interventions Discharge Planning, Psychosocial Support, Patient/Family Education   Barriers to Discharge MD  Medical stability, Home enviroment access/loayout, Incontinence, Lack of/limited family support, Weight, and Hemodialysis  Nursing Decreased caregiver support, Home environment access/layout, Incontinence, Lack of/limited family support, Hemodialysis, Medication compliance Lives in 2 level home with 1 step to enter and no rails. Able to live on main level with bedroom/bathroom. Lives with daughter who can provide min assist with mobility and ADL's  PT Home environment access/layout    OT      SLP      SW Decreased caregiver support, Lack of/limited family support, Hemodialysis     Team Discharge Planning: Destination: PT-Home ,OT- Home ,  SLP-Home Projected Follow-up: PT-Home health PT, OT-  None, SLP-Other (comment) (TBD) Projected Equipment Needs: PT-Rolling walker with 5" wheels, OT- To be determined, SLP-None recommended by SLP Equipment Details: PT-pt owns rollator, will likely need RW at d/c, OT-  Patient/family involved in discharge planning:  PT- Patient,  OT-Patient, SLP-Patient  MD ELOS: 7-10 dqays Medical Rehab Prognosis:  Good Assessment: Pt is an 87 yr old female with ESRD, on HD; DM; Rib fx's after PEA arrest; and L MCA stroke with R hemiparesis  Goals- supervision    See Team Conference Notes for weekly updates to the plan of care

## 2021-07-19 NOTE — Progress Notes (Signed)
Occupational Therapy Session Note  Patient Details  Name: Porschea Stout MRN: [id number] Date of Birth: [birth year]  Today's Date: 07/19/2021 OT Individual Time: 1310-1340 OT Individual Time Calculation (min): 30 min    Short Term Goals: Week 1:  OT Short Term Goal 1 (Week 1): STGs = LTGs 2/2 ELOS at Supervision  Skilled Therapeutic Interventions/Progress Updates:    Pt sleeping in bed upon arrival but easily aroused. Pt had not eaten lunch but declined, stating she tried it and she "just couldn't eat it." Pt agreeabel to getting OOB for therapy. Pt amb 257'x2 with RW and CGA. Pt engaged in standing activities in gym; tossig bean bags at target. Pt returned to room and requested to return to bed. Pt states she is not sleeping well at night. Pt also concerned about the quality of her voice. Pt remained in bed with all needs within reach and bed alarm activated.   Therapy Documentation Precautions:  Precautions Precautions: Fall Precaution Comments: rib fxs (R4, 6-8 and L 6-9th), expressive aphasia Restrictions Weight Bearing Restrictions: No  Pain:  Pt denies pain this afternoon   Therapy/Group: Individual Therapy  Leroy Libman 07/19/2021, 1:49 PM

## 2021-07-19 NOTE — Progress Notes (Signed)
Speech Language Pathology Daily Session Note  Patient Details  Name: Stacey Dorsey MRN: [id number] Date of Birth: 02/08/1933  Today's Date: 07/19/2021 SLP Individual Time: 8341-9622 SLP Individual Time Calculation (min): 30 min  Short Term Goals: Week 1: SLP Short Term Goal 1 (Week 1): Patient will be able to name at least 10 items in a given category with minA verbal cues. SLP Short Term Goal 2 (Week 1): Patient will be able to summarize after reading or listening to short paragraph length story, with minA verbal cues. SLP Short Term Goal 3 (Week 1): Patient will answer complex yes/no questions with 90% accuracy and minA verbal cues. SLP Short Term Goal 4 (Week 1): Patient will participate in assessment of complex level cognitive abilities. SLP Short Term Goal 5 (Week 1): Patient will be able to recall and utilize learned strategies in PT/OT and ST sessions with minA verbal, visual cues. SLP Short Term Goal 6 (Week 1): Patient will be able to name less common object pictures/photos with at least 80% accuracy and minA verbal cues.  Skilled Therapeutic Interventions: Skilled treatment session focused on cognitive goals. SLP facilitated session by providing Min verbal cues for utilization of memory compensatory strategies for recall of the names of her grandchildren. Patient utilized association and visualization to maximize recall and was able to recall all 4 of her grandchildren's names independently. Patient left upright in bed with alarm on and all needs within reach. Continue with current plan of care.      Pain No/Denies Pain   Therapy/Group: Individual Therapy  Stacey Dorsey 07/19/2021, 3:17 PM

## 2021-07-20 DIAGNOSIS — S2242XS Multiple fractures of ribs, left side, sequela: Secondary | ICD-10-CM

## 2021-07-20 DIAGNOSIS — E119 Type 2 diabetes mellitus without complications: Secondary | ICD-10-CM

## 2021-07-20 MED ORDER — TROLAMINE SALICYLATE 10 % EX CREA: TOPICAL_CREAM | Freq: Two times a day (BID) | CUTANEOUS | Status: DC | PRN

## 2021-07-20 NOTE — Progress Notes (Signed)
PROGRESS NOTE   Subjective/Complaints:  Complains of significant soreness from ribs. Making it difficult to breath and sleep at times  ROS: Patient denies fever, rash, sore throat, blurred vision, nausea, vomiting, diarrhea, cough,   headache, or mood change.     Objective:   No results found. No results for input(s): WBC, HGB, HCT, PLT in the last 72 hours.  Recent Labs    07/18/21 1400  NA 137  K 4.2  CL 102  CO2 24  GLUCOSE 117*  BUN 53*  CREATININE 7.10*  CALCIUM 8.6*    Intake/Output Summary (Last 24 hours) at 07/20/2021 0943 Last data filed at 07/20/2021 0100 Gross per 24 hour  Intake 178 ml  Output --  Net 178 ml        Physical Exam: Vital Signs Blood pressure (!) 158/50, pulse 63, temperature 98.2 F (36.8 C), temperature source Oral, resp. rate 20, height 5\' 9"  (1.753 m), weight 61.3 kg, SpO2 100 %.     Constitutional: No distress . Vital signs reviewed. HEENT: NCAT, EOMI, oral membranes moist Neck: supple Cardiovascular: RRR without murmur. No JVD    Respiratory/Chest: CTA Bilaterally without wheezes or rales. Normal effort    GI/Abdomen: BS +, non-tender, non-distended Ext: no clubbing, cyanosis, or edema Psych: pleasant but anxious Musculoskeletal:  Remains TTP over multiple rib fractures- anterior ribs, sternum RUE 5-/5; LUE 5/5 RLE_ 5-/5; LLE 5/5  Skin:    General: Skin is warm and dry.     Comments: Heels slightly boggy B/L  IV R upper arms looks good Thrill LUE from fistula  Neurological:     Mental Status: She is alert and oriented to person, place, and time.     Comments: Alert and oriented x 3. fairl insight and awareness. ?fair Memory. Normal language and speech. Cranial nerve exam unremarkable  Intact to light touch in all 4 extremities       Assessment/Plan: 1. Functional deficits which require 3+ hours per day of interdisciplinary therapy in a comprehensive  inpatient rehab setting. Physiatrist is providing close team supervision and 24 hour management of active medical problems listed below. Physiatrist and rehab team continue to assess barriers to discharge/monitor patient progress toward functional and medical goals  Care Tool:  Bathing    Body parts bathed by patient: Right arm, Left arm, Chest, Abdomen, Front perineal area, Right upper leg, Left upper leg, Face, Buttocks   Body parts bathed by helper: Right lower leg, Left lower leg     Bathing assist Assist Level: Moderate Assistance - Patient 50 - 74%     Upper Body Dressing/Undressing Upper body dressing   What is the patient wearing?: Pull over shirt    Upper body assist Assist Level: Minimal Assistance - Patient > 75%    Lower Body Dressing/Undressing Lower body dressing      What is the patient wearing?: Underwear/pull up, Pants     Lower body assist Assist for lower body dressing: Minimal Assistance - Patient > 75%     Toileting Toileting    Toileting assist Assist for toileting: Supervision/Verbal cueing     Transfers Chair/bed transfer  Transfers assist     Chair/bed  transfer assist level: Minimal Assistance - Patient > 75%     Locomotion Ambulation   Ambulation assist      Assist level: Minimal Assistance - Patient > 75% Assistive device: Walker-rolling Max distance: 200'   Walk 10 feet activity   Assist     Assist level: Minimal Assistance - Patient > 75% Assistive device: Walker-rolling   Walk 50 feet activity   Assist    Assist level: Minimal Assistance - Patient > 75% Assistive device: Walker-rolling    Walk 150 feet activity   Assist    Assist level: Minimal Assistance - Patient > 75% Assistive device: Walker-rolling    Walk 10 feet on uneven surface  activity   Assist Walk 10 feet on uneven surfaces activity did not occur: Safety/medical concerns         Wheelchair     Assist Is the patient using a  wheelchair?: No             Wheelchair 50 feet with 2 turns activity    Assist            Wheelchair 150 feet activity     Assist          Blood pressure (!) 158/50, pulse 63, temperature 98.2 F (36.8 C), temperature source Oral, resp. rate 20, height 5\' 9"  (1.753 m), weight 61.3 kg, SpO2 100 %.    Medical Problem List and Plan: 1. Functional deficits secondary to anterior left MCA stroke/encephalomalacia further defined by MRI as multiple microembolic infarcts in that distribution; metabolic encephalopathy             -patient may  shower             -ELOS/Goals: 6-9 days supervision to mod I  -Continue CIR therapies including PT, OT, and SLP  2.  Antithrombotics: -DVT/anticoagulation:  Pharmaceutical: Heparin             -antiplatelet therapy: DAPT>>Plavix, aspirin 235 mg daily for 3 months then aspirin alone 3. Pain Management: Tylenol,- has rib pain- might need lidoderm patches?  12/21- will order lidoderm patches 2- 8pm-8am and see if needs something stronger?  12/22- Tramadol was tried- will stop since made her anxious/restless- will start Tylenol PRN (get rid of scheduled) and tylenol #3 prn  12/24- discussed pain from rib fx's. She's hurting more today because she moved quite a bit yesterday   -will try aspercreme in addition to lidoderm, tylenol   -ordered kpad also 4. Mood: LCSW to assess and provide support             -antipsychotic agents: n/a 5. Neuropsych: This patient is capable of making decisions on her own behalf. Consider neuropsych referral  6. Skin/Wound Care: routine skin checks 7. Fluids/Electrolytes/Nutrition: Is and Os and follow-up chemistries.  --Chronic iHD. T/T/S --Tolerating diet. 8. ESRD: TTS schedule after therapies to allow full participation --[facility] clinic as outpatient 9. Acute on chronic hypoxic respiratory failure/pneumonia: completed ceftriaxone on 12/15.  --Currently normal SaO2 on RA 10: Anemia: [place] and  improving -- Aranesp per nephrology 11: Hypertension: Systolic BP averaging 025-852D. Continue coreg, nifedipine, clonidine  12/23- BP slightly elevated- con't regimen since on HD  12: Gout: no flare, continue allopurinol 13: GERD: continue Protonix 14: DM2: Hgb A1c = 5.7%.  --Continue CBGs checks, SSI 12/24- reasonable control 15: Grade 1 diastolic dysfunction: monitor  16: Bilateral carotid stenosis: will need annual carotid duplex follow-up 17: Hyperlipidemia: continue simvastatin 18: Rib fractures: aggressive pulmonary hygiene,  encourage deep cough; IS.  --Consider outpatient PFTs/pulmonary follow-up 12/24 see above 19. Restless leg syndrome: continue Requip 20 Chronic constipation- will schedule Suppository every other day after dinner.    12/24 had bm 12/23   21. Anxiety- new. Often pain related  12/22- likely tramadol- will stop and give vistaril 10 mg q8 hours prn.   12/23- resolved off tramadol.   LOS: 4 days A FACE TO FACE EVALUATION WAS PERFORMED  Meredith Staggers 07/20/2021, 9:43 AM

## 2021-07-20 NOTE — Progress Notes (Signed)
Patient ID: Stacey Dorsey, female   DOB: July 05, 1933, 87 y.o.   MRN: [id number] S: no acute events, had some pain in her chest with movement, better now. O:BP (!) 158/50 (BP Location: Right Arm)    Pulse 63    Temp 98.2 F (36.8 C) (Oral)    Resp 20    Ht 5\' 9"  (1.753 m)    Wt 61.3 kg    SpO2 100%    BMI 19.96 kg/m   Intake/Output Summary (Last 24 hours) at 07/20/2021 0936 Last data filed at 07/20/2021 0100 Gross per 24 hour  Intake 178 ml  Output --  Net 178 ml   Intake/Output: I/O last 3 completed shifts: In: 416 [P.O.:416] Out: -   Intake/Output this shift:  No intake/output data recorded. Weight change: 0.7 kg Gen:NAD CVS: RRR Resp:CTA Abd: +BS, soft, NT/ND Ext: no edema, LUE AVG +T/B   Recent Labs  Lab 07/16/21 0313 07/18/21 1400  NA 137 137  K 4.7 4.2  CL 99 102  CO2 24 24  GLUCOSE 115* 117*  BUN 54* 53*  CREATININE 7.50* 7.10*  ALBUMIN 2.7* 3.0*  CALCIUM 8.7* 8.6*  PHOS 6.1* 4.5   Liver Function Tests: Recent Labs  Lab 07/16/21 0313 07/18/21 1400  ALBUMIN 2.7* 3.0*   No results for input(s): LIPASE, AMYLASE in the last 168 hours. No results for input(s): AMMONIA in the last 168 hours. CBC: Recent Labs  Lab 07/16/21 0313  WBC 8.6  HGB 9.1*  HCT 28.8*  MCV 90.6  PLT 260   Cardiac Enzymes: No results for input(s): CKTOTAL, CKMB, CKMBINDEX, TROPONINI in the last 168 hours. CBG: Recent Labs  Lab 07/16/21 0639 07/16/21 1121 07/16/21 1549 07/16/21 2102 07/17/21 0617  GLUCAP 124* 122* 134* 187* 106*    Iron Studies: No results for input(s): IRON, TIBC, TRANSFERRIN, FERRITIN in the last 72 hours. Studies/Results: No results found.  allopurinol  100 mg Oral Daily   aspirin  325 mg Oral Daily   bisacodyl  10 mg Rectal QODAY   carvedilol  25 mg Oral BID WC   cloNIDine  0.1 mg Oral BID   clopidogrel  75 mg Oral Daily   darbepoetin (ARANESP) injection - DIALYSIS  60 mcg Intravenous Q Sat-HD   feeding supplement (NEPRO CARB STEADY)  237 mL Oral  BID   ferric citrate  210 mg Oral TID WC   heparin  5,000 Units Subcutaneous Q8H   lidocaine  2 patch Transdermal Q24H   lidocaine-prilocaine  1 application Topical Once per day on Mon Wed Fri   mometasone-formoterol  2 puff Inhalation BID   multivitamin  1 tablet Oral QHS   NIFEdipine  60 mg Oral QHS   pantoprazole  40 mg Oral Daily   rOPINIRole  0.5 mg Oral QHS   senna-docusate  2 tablet Oral BID   simvastatin  20 mg Oral QHS    BMET    Component Value Date/Time   NA 137 07/18/2021 1400   K 4.2 07/18/2021 1400   CL 102 07/18/2021 1400   CO2 24 07/18/2021 1400   GLUCOSE 117 (H) 07/18/2021 1400   BUN 53 (H) 07/18/2021 1400   CREATININE 7.10 (H) 07/18/2021 1400   CALCIUM 8.6 (L) 07/18/2021 1400   GFRNONAA 5 (L) 07/18/2021 1400   CBC    Component Value Date/Time   WBC 8.6 07/16/2021 0313   RBC 3.18 (L) 07/16/2021 0313   HGB 9.1 (L) 07/16/2021 0313   HCT 28.8 (L) 07/16/2021 9924  PLT 260 07/16/2021 0313   MCV 90.6 07/16/2021 0313   MCH 28.6 07/16/2021 0313   MCHC 31.6 07/16/2021 0313   RDW 17.2 (H) 07/16/2021 0313   LYMPHSABS 5.1 (H) 12/31/2020 2334   MONOABS 1.1 (H) 12/31/2020 2334   EOSABS 0.4 12/31/2020 2334   BASOSABS 0.1 12/31/2020 2334    Dialysis Orders:  [facility]  3.5h   64.5kg  2/2 bath  400/500   LUA AVG  Hep none  Sensipar 30 ug tiw po Hectorol 5 ug tiw IV Mircera 75 ug q 2wks , last 12/01  Venofer 50 mg q. Weekly       Assessment/Plan: Acute CVA - L MCA by CT and MRI, +aphasia, on DAPT. Microembolic infarctions. EP consulted for loop recorder.  Now in CIR. PEA arrest - Out of hospital, per primary.   ESRD - TTS HD. Will keep on outpatient schedule. HD today Rib fractures/ chest pain - due to CPR and still complaining of chest pain. Pneumonia- improved, completed course of Rocephin/ azithro IV here HTN/vol - BP's high. Coreg/ procardia as at home, clonidine added. 1kg under dry. Increased procardia to 60 qd. Uf'ing as tolerated Anemia-  Hb  9- 10. Weekly Aranesp 60ug on thursdays, last 12/15. Restarted, dose 12/24 CKD-BMD- Ca ok, P rising, resumed auryxia-phos better now AMS with tramadol.  Avoid in the future.   Gean Quint, MD [facility]

## 2021-07-20 NOTE — Progress Notes (Signed)
Occupational Therapy Note  Patient Details  Name: Stacey Dorsey MRN: [id number] Date of Birth: April 10, 1933  Today's Date: 07/20/2021 OT Missed Time: 75 Minutes Missed Time Reason: Patient fatigue;Other (comment)  Pt initially sleeping upon arrival but easily aroused. Pt c/o generalized pain and states she just received medications. Pt requested I return later. Upon returning 60 mins later, pt eating breakfast. Pt did not recall earlier encounter. Pt has dialysis after lunch. Schedule not conducive to rescheduling OT today. Pt missed 75 mins OT services 2/2 drowsiness, pain, and eating breakfast.    Leotis Shames [facility] 07/20/2021, 8:46 AM

## 2021-07-20 NOTE — Progress Notes (Signed)
Physical Therapy Session Note  Patient Details  Name: Stacey Dorsey MRN: [id number] Date of Birth: [birth year]  Today's Date: 07/20/2021 PT Individual Time: 0910-1005 PT Individual Time Calculation (min): 55 min   Short Term Goals: Week 1:  PT Short Term Goal 1 (Week 1): =LTG due to ELOS  Skilled Therapeutic Interventions/Progress Updates: Pt presented in bed agreeable to therapy. PT denies pain at rest but states had a "bad night" due to rib pain. Pt performed supine to sit with CGA with notable guarding when pushing up to supine. Pt requesting to use bathroom, performed ambulatory transfer to bathroom with RW and CGA. Pt able to sit to toilet with CGA (+ void). At toilet pt was able to change brief, don pad, and don pants with set up. Required minA for stand from lowered toilet and pt pull up brief/pants with CGA. Pt then ambulated to sink and began to was hands but needed to bend at counter due to pain. PTA offered w/c and pt was able to complete task and perform oral hygiene at w/c level mod I. Pt then ambulated to rehab gym >134f with RW and CGA with pt demonstrating good cadence, step length, and safety. Pt participated in reaching/balancing task of placing cards on board but performing step/lunge to reach. ACtivity performed without AD and CGA overall. Pt initially required min cues for placing correct card to match but then able to perform remaining 80% accurately and without cues. Pt then transported partial distance back to room and ambulated ~79fwith AD and CGA. Pt agreeable to sit in recliner until next session. Pt left in recliner with seat alarm on, call bell within reach and needs met.      Therapy Documentation Precautions:  Precautions Precautions: Fall Precaution Comments: rib fxs (R4, 6-8 and L 6-9th), expressive aphasia Restrictions Weight Bearing Restrictions: No General:   Vital Signs:  Pain:   Mobility:   Locomotion :    Trunk/Postural Assessment :     Balance:   Exercises:   Other Treatments:      Therapy/Group: Individual Therapy  Shastina Rua 07/20/2021, 12:50 PM

## 2021-07-20 NOTE — Progress Notes (Signed)
Speech Language Pathology Daily Session Note  Patient Details  Name: Stacey Dorsey MRN: [id number] Date of Birth: 09/24/1932  Today's Date: 07/20/2021 SLP Individual Time: 9191-6606 SLP Individual Time Calculation (min): 40 min  Short Term Goals: Week 1: SLP Short Term Goal 1 (Week 1): Patient will be able to name at least 10 items in a given category with minA verbal cues. SLP Short Term Goal 2 (Week 1): Patient will be able to summarize after reading or listening to short paragraph length story, with minA verbal cues. SLP Short Term Goal 3 (Week 1): Patient will answer complex yes/no questions with 90% accuracy and minA verbal cues. SLP Short Term Goal 4 (Week 1): Patient will participate in assessment of complex level cognitive abilities. SLP Short Term Goal 5 (Week 1): Patient will be able to recall and utilize learned strategies in PT/OT and ST sessions with minA verbal, visual cues. SLP Short Term Goal 6 (Week 1): Patient will be able to name less common object pictures/photos with at least 80% accuracy and minA verbal cues.  Skilled Therapeutic Interventions: Skilled treatment session focused on cognitive goals. Upon arrival, patient requested to use the bathroom. SLP facilitated session by providing Min verbal cues for attention and problem solving with task as she was asking SLP to perform another task while ambulating to the commode. Patient was continent of bowel and attempted to walk out of the bathroom prior to pulling up her brief and pants. Patient recalled this SLP from yesterday and independently recalled the names of her grandchildren!  Patient had questions regarding current medications. SLP provided a list with patient requiring Mod verbal cues for recall of the functions of her medications as well as demonstrating decreased mental flexibility regarding new medications from this admission. Patient left upright in her recliner with alarm on and all needs within reach. Continue  with current plan of care.      Pain No/Denies Pain   Therapy/Group: Individual Therapy  Dacie Mandel 07/20/2021, 2:19 PM

## 2021-07-21 LAB — RENAL FUNCTION PANEL
Albumin: 2.7 g/dL — ABNORMAL LOW (ref 3.5–5.0)
Anion gap: 10 (ref 5–15)
BUN: 51 mg/dL — ABNORMAL HIGH (ref 8–23)
CO2: 25 mmol/L (ref 22–32)
Calcium: 8.8 mg/dL — ABNORMAL LOW (ref 8.9–10.3)
Chloride: 103 mmol/L (ref 98–111)
Creatinine, Ser: 8.86 mg/dL — ABNORMAL HIGH (ref 0.44–1.00)
GFR, Estimated: 4 mL/min — ABNORMAL LOW (ref >60)
Glucose, Bld: 137 mg/dL — ABNORMAL HIGH (ref 70–99)
Phosphorus: 4.6 mg/dL (ref 2.5–4.6)
Potassium: 4.4 mmol/L (ref 3.5–5.1)
Sodium: 138 mmol/L (ref 135–145)

## 2021-07-21 LAB — CBC
HCT: 28.4 % — ABNORMAL LOW (ref 36.0–46.0)
Hemoglobin: 8.8 g/dL — ABNORMAL LOW (ref 12.0–15.0)
MCH: 28.4 pg (ref 26.0–34.0)
MCHC: 31 g/dL (ref 30.0–36.0)
MCV: 91.6 fL (ref 80.0–100.0)
Platelets: 342 10*3/uL (ref 150–400)
RBC: 3.1 MIL/uL — ABNORMAL LOW (ref 3.87–5.11)
RDW: 17.8 % — ABNORMAL HIGH (ref 11.5–15.5)
WBC: 7.9 10*3/uL (ref 4.0–10.5)
nRBC: 0 % (ref 0.0–0.2)

## 2021-07-21 NOTE — Progress Notes (Signed)
Patient ID: Stacey Dorsey, female   DOB: Dec 03, 1932, 87 y.o.   MRN: [id number] S: no acute events, did not rec HD yesterday due to staffing issues. Possible HD today. Patient reports that she is willing to wait until Tuesday if needed O:BP (!) 157/68 (BP Location: Right Arm)    Pulse 70    Temp 97.8 F (36.6 C)    Resp 17    Ht 5\' 9"  (1.753 m)    Wt 60.6 kg    SpO2 100%    BMI 19.73 kg/m   Intake/Output Summary (Last 24 hours) at 07/21/2021 0820 Last data filed at 07/20/2021 2040 Gross per 24 hour  Intake 60 ml  Output --  Net 60 ml   Intake/Output: I/O last 3 completed shifts: In: 120 [P.O.:120] Out: -   Intake/Output this shift:  No intake/output data recorded. Weight change: -0.7 kg Gen:NAD CVS: RRR Resp:CTA Abd: +BS, soft, NT/ND Ext: no edema, LUE AVG +T/B   Recent Labs  Lab 07/16/21 0313 07/18/21 1400  NA 137 137  K 4.7 4.2  CL 99 102  CO2 24 24  GLUCOSE 115* 117*  BUN 54* 53*  CREATININE 7.50* 7.10*  ALBUMIN 2.7* 3.0*  CALCIUM 8.7* 8.6*  PHOS 6.1* 4.5   Liver Function Tests: Recent Labs  Lab 07/16/21 0313 07/18/21 1400  ALBUMIN 2.7* 3.0*   No results for input(s): LIPASE, AMYLASE in the last 168 hours. No results for input(s): AMMONIA in the last 168 hours. CBC: Recent Labs  Lab 07/16/21 0313  WBC 8.6  HGB 9.1*  HCT 28.8*  MCV 90.6  PLT 260   Cardiac Enzymes: No results for input(s): CKTOTAL, CKMB, CKMBINDEX, TROPONINI in the last 168 hours. CBG: Recent Labs  Lab 07/16/21 0639 07/16/21 1121 07/16/21 1549 07/16/21 2102 07/17/21 0617  GLUCAP 124* 122* 134* 187* 106*    Iron Studies: No results for input(s): IRON, TIBC, TRANSFERRIN, FERRITIN in the last 72 hours. Studies/Results: No results found.  allopurinol  100 mg Oral Daily   aspirin  325 mg Oral Daily   bisacodyl  10 mg Rectal QODAY   carvedilol  25 mg Oral BID WC   cloNIDine  0.1 mg Oral BID   clopidogrel  75 mg Oral Daily   darbepoetin (ARANESP) injection - DIALYSIS  60 mcg  Intravenous Q Sat-HD   feeding supplement (NEPRO CARB STEADY)  237 mL Oral BID   ferric citrate  210 mg Oral TID WC   heparin  5,000 Units Subcutaneous Q8H   lidocaine  2 patch Transdermal Q24H   lidocaine-prilocaine  1 application Topical Once per day on Mon Wed Fri   mometasone-formoterol  2 puff Inhalation BID   multivitamin  1 tablet Oral QHS   NIFEdipine  60 mg Oral QHS   pantoprazole  40 mg Oral Daily   rOPINIRole  0.5 mg Oral QHS   senna-docusate  2 tablet Oral BID   simvastatin  20 mg Oral QHS    BMET    Component Value Date/Time   NA 137 07/18/2021 1400   K 4.2 07/18/2021 1400   CL 102 07/18/2021 1400   CO2 24 07/18/2021 1400   GLUCOSE 117 (H) 07/18/2021 1400   BUN 53 (H) 07/18/2021 1400   CREATININE 7.10 (H) 07/18/2021 1400   CALCIUM 8.6 (L) 07/18/2021 1400   GFRNONAA 5 (L) 07/18/2021 1400   CBC    Component Value Date/Time   WBC 8.6 07/16/2021 0313   RBC 3.18 (L) 07/16/2021 2694  HGB 9.1 (L) 07/16/2021 0313   HCT 28.8 (L) 07/16/2021 0313   PLT 260 07/16/2021 0313   MCV 90.6 07/16/2021 0313   MCH 28.6 07/16/2021 0313   MCHC 31.6 07/16/2021 0313   RDW 17.2 (H) 07/16/2021 0313   LYMPHSABS 5.1 (H) 12/31/2020 2334   MONOABS 1.1 (H) 12/31/2020 2334   EOSABS 0.4 12/31/2020 2334   BASOSABS 0.1 12/31/2020 2334    Dialysis Orders:  [place] [facility]  3.5h   64.5kg  2/2 bath  400/500   LUA AVG  Hep none  Sensipar 30 ug tiw po Hectorol 5 ug tiw IV Mircera 75 ug q 2wks , last 12/01  Venofer 50 mg q. Weekly       Assessment/Plan: Acute CVA - L MCA by CT and MRI, +aphasia, on DAPT. Microembolic infarctions. EP consulted for loop recorder.  Now in CIR. PEA arrest - Out of hospital, per primary.   ESRD - TTS HD. Will keep on outpatient schedule. HD possibly today vs Tues (asymptomatic, euvolemic on exam). Will order labs for today Rib fractures/ chest pain - due to CPR and still complaining of intermittent chest pain. Pneumonia- improved, completed course of  Rocephin/ azithro IV here HTN/vol - BP's high. Coreg/ procardia as at home, clonidine added. 1kg under dry. Increased procardia to 60 qd. Uf'ing as tolerated Anemia-  Hb 9- 10. Weekly Aranesp 60ug on thursdays, last 12/15. Restarted, dose 12/24 (or w/ next HD) CKD-BMD- Ca ok, P rising, resumed auryxia-phos better now AMS with tramadol.  Avoid in the future.   Gean Quint, MD [facility]

## 2021-07-21 NOTE — Progress Notes (Signed)
Labs reviewed. K, bicarb, phos all WNL. Given her labs and euvolemic state, can likely hold off on HD until her schedule time which will be on Tuesday.  Gean Quint, MD [facility]

## 2021-07-22 MED ORDER — TRAZODONE HCL 50 MG PO TABS
50.0000 mg | ORAL_TABLET | Freq: Every day | ORAL | Status: DC
  Administered 2021-07-22 – 2021-07-25 (×4): 50 mg via ORAL
  Filled 2021-07-22 (×4): qty 1

## 2021-07-22 NOTE — Progress Notes (Signed)
Physical Therapy Session Note  Patient Details  Name: Stacey Dorsey MRN: [id number] Date of Birth: [birth year]  Today's Date: 07/22/2021 PT Individual Time: 1300-1400 PT Individual Time Calculation (min): 60 min   Short Term Goals: Week 1:  PT Short Term Goal 1 (Week 1): =LTG due to ELOS  Skilled Therapeutic Interventions/Progress Updates:    Pt received seated in bed finishing lunch, agreeable to PT session. Pt reports ongoing pain in her ribs, rated 6/10. Pt requests pain medication from nursing at beginning of session for pain management. Pt reports she was unable to sleep last night due to pain but thought that nursing would bring her pain medication, educated pt that she needs to request pain medication if she is in pain and having trouble sleeping. Supine to sit with CGA with HOB elevated and use of bedrail. Sit to stand and stand pivot transfer with RW and CGA throughout session. Ambulation 2 x 150 ft with RW with close Supervision to [place]. Ascend/descend 12 x 6" stairs with 1-2 handrails and CGA for balance, step-through and step-to gait pattern. Standing alt L/R 6" step-taps with CGA and no AD progressing to step-ups and step-downs with no AD and min A for balance to simulate home environment with no handrails. Pt requires cues to get LE closer to step/curb before stepping up for increased safety and balance. Sit to/from supine on real bed therapy apartment with CGA, pt uses bedside table for assist with transfer. Per pt report her bedside table at home is sturdy and she can safely use it for assist. Pt also reports her bed at home is very tall and she crawls onto a stool/bench to get in/out of bed. Educated pt that she is likely not safe to continue to transfer in/out of bed in this manner and recommend her family lower her bed height if possible. Pt reports her family can remove her pillow-top mattress cover to decrease bed height. Provided pt with home measurement sheet to have family measure  bed height and car seat height. Pt left seated EOB with needs in reach for next therapy session with OT.  Therapy Documentation Precautions:  Precautions Precautions: Fall Precaution Comments: rib fxs (R4, 6-8 and L 6-9th), expressive aphasia Restrictions Weight Bearing Restrictions: No     Therapy/Group: Individual Therapy   Excell Seltzer, PT, DPT, CSRS  07/22/2021, 2:23 PM

## 2021-07-22 NOTE — Progress Notes (Signed)
SLP Cancellation Note  Patient Details Name: Addysen Hritz MRN: [id number] DOB: Oct 01, 1932   Cancelled treatment: Pt taken to dialysis this date ~1415 per nursing. Pt missed 45 minutes of skilled Speech Therapy. Will follow up per ST POC.  Dewaine Conger 07/22/2021, 2:37 PM

## 2021-07-22 NOTE — Procedures (Signed)
Orders received to perform Hemodialysis treatment.  Pt arrived and stated she was told by a doctor that she didn't need to have hemodialysis until Tuesday.  She refused her treatment for today.  [place] MD notified and verbal order received to return pt to room and that we will run her treatment tomorrow, Tuesday 07/23/21 as per Pt request.

## 2021-07-22 NOTE — Progress Notes (Signed)
PROGRESS NOTE   Subjective/Complaints:  Pt reports still has significant rib pain-but can now turn a little on her own.  Poor sleep- couldn't stop "thinking'- fell asleep at 5:30am- LBM yesterday Is cold in the room at night, per pt reques-t but asked me to increase for daytime.  Didn't eat breakfast- doesn't like to eat usually.    ROS:  Pt denies SOB, abd pain, CP, N/V/C/D, and vision changes  Objective:   No results found. Recent Labs    07/21/21 1000  WBC 7.9  HGB 8.8*  HCT 28.4*  PLT 342    Recent Labs    07/21/21 1000  NA 138  K 4.4  CL 103  CO2 25  GLUCOSE 137*  BUN 51*  CREATININE 8.86*  CALCIUM 8.8*    Intake/Output Summary (Last 24 hours) at 07/22/2021 1015 Last data filed at 07/22/2021 0100 Gross per 24 hour  Intake 457 ml  Output --  Net 457 ml        Physical Exam: Vital Signs Blood pressure (!) 149/52, pulse 66, temperature 98 F (36.7 C), temperature source Oral, resp. rate 14, height 5\' 9"  (1.753 m), weight 60.6 kg, SpO2 100 %.      General: awake, alert, appropriate, laying supine in bed- hasn't eaten breakfast; NAD HENT: conjugate gaze; oropharynx moist CV: regular rate; no JVD Pulmonary: CTA B/L; no W/R/R- good air movement GI: soft, NT, ND, (+)BS Psychiatric: appropriate- pleasant Neurological: alert- fair to poor memory Ext: no clubbing, cyanosis, or edema Psych: pleasant but anxious Musculoskeletal:  Remains TTP over multiple rib fractures- anterior ribs, sternum RUE 5-/5; LUE 5/5 RLE_ 5-/5; LLE 5/5  Skin:    General: Skin is warm and dry.     Comments: Heels slightly boggy B/L  IV R upper arms looks good Thrill LUE from fistula  Neurological:     Mental Status: She is alert and oriented to person, place, and time.     Comments: Alert and oriented x 3. fairl insight and awareness. ?fair Memory. Normal language and speech. Cranial nerve exam unremarkable  Intact  to light touch in all 4 extremities       Assessment/Plan: 1. Functional deficits which require 3+ hours per day of interdisciplinary therapy in a comprehensive inpatient rehab setting. Physiatrist is providing close team supervision and 24 hour management of active medical problems listed below. Physiatrist and rehab team continue to assess barriers to discharge/monitor patient progress toward functional and medical goals  Care Tool:  Bathing    Body parts bathed by patient: Right arm, Left arm, Chest, Abdomen, Front perineal area, Buttocks, Right upper leg, Left upper leg, Right lower leg, Left lower leg, Face   Body parts bathed by helper: Right lower leg, Left lower leg     Bathing assist Assist Level: Contact Guard/Touching assist     Upper Body Dressing/Undressing Upper body dressing   What is the patient wearing?: Pull over shirt    Upper body assist Assist Level: Set up assist    Lower Body Dressing/Undressing Lower body dressing      What is the patient wearing?: Underwear/pull up, Pants     Lower body assist Assist  for lower body dressing: Contact Guard/Touching assist     Toileting Toileting    Toileting assist Assist for toileting: Supervision/Verbal cueing     Transfers Chair/bed transfer  Transfers assist     Chair/bed transfer assist level: Minimal Assistance - Patient > 75%     Locomotion Ambulation   Ambulation assist      Assist level: Minimal Assistance - Patient > 75% Assistive device: Walker-rolling Max distance: 200'   Walk 10 feet activity   Assist     Assist level: Minimal Assistance - Patient > 75% Assistive device: Walker-rolling   Walk 50 feet activity   Assist    Assist level: Minimal Assistance - Patient > 75% Assistive device: Walker-rolling    Walk 150 feet activity   Assist    Assist level: Minimal Assistance - Patient > 75% Assistive device: Walker-rolling    Walk 10 feet on uneven surface   activity   Assist Walk 10 feet on uneven surfaces activity did not occur: Safety/medical concerns         Wheelchair     Assist Is the patient using a wheelchair?: No             Wheelchair 50 feet with 2 turns activity    Assist            Wheelchair 150 feet activity     Assist          Blood pressure (!) 149/52, pulse 66, temperature 98 F (36.7 C), temperature source Oral, resp. rate 14, height 5\' 9"  (1.753 m), weight 60.6 kg, SpO2 100 %.    Medical Problem List and Plan: 1. Functional deficits secondary to anterior left MCA stroke/encephalomalacia further defined by MRI as multiple microembolic infarcts in that distribution; metabolic encephalopathy             -patient may  shower             -ELOS/Goals: 6-9 days supervision to mod I  Continue CIR- PT, OT and SLP 2.  Antithrombotics: -DVT/anticoagulation:  Pharmaceutical: Heparin             -antiplatelet therapy: DAPT>>Plavix, aspirin 235 mg daily for 3 months then aspirin alone 3. Pain Management: Tylenol,- has rib pain- might need lidoderm patches?  12/21- will order lidoderm patches 2- 8pm-8am and see if needs something stronger?  12/22- Tramadol was tried- will stop since made her anxious/restless- will start Tylenol PRN (get rid of scheduled) and tylenol #3 prn  12/24- discussed pain from rib fx's. She's hurting more today because she moved quite a bit yesterday   -will try aspercreme in addition to lidoderm, tylenol   -ordered kpad also  12/26- Hasn't taken Tylenol #3 yet- will ask nursing to encourage usage before therapy.  4. Mood: LCSW to assess and provide support             -antipsychotic agents: n/a 5. Neuropsych: This patient is capable of making decisions on her own behalf. Consider neuropsych referral  6. Skin/Wound Care: routine skin checks 7. Fluids/Electrolytes/Nutrition: Is and Os and follow-up chemistries.  --Chronic iHD. T/T/S --Tolerating diet. 8. ESRD: TTS  schedule after therapies to allow full participation --[facility] as outpatient 9. Acute on chronic hypoxic respiratory failure/pneumonia: completed ceftriaxone on 12/15.  --Currently normal SaO2 on RA 10: Anemia: [place] and improving -- Aranesp per nephrology 11: Hypertension: Systolic BP averaging 025-427C. Continue coreg, nifedipine, clonidine  12/23- BP slightly elevated- con't regimen since on HD  12/26- BP a little elevated- but will d/w Nephrology.  12: Gout: no flare, continue allopurinol 13: GERD: continue Protonix 14: DM2: Hgb A1c = 5.7%.  --Continue CBGs checks, SSI 12/26- overall good control- con't regimen 15: Grade 1 diastolic dysfunction: monitor  16: Bilateral carotid stenosis: will need annual carotid duplex follow-up 17: Hyperlipidemia: continue simvastatin 18: Rib fractures: aggressive pulmonary hygiene, encourage deep cough; IS.  --Consider outpatient PFTs/pulmonary follow-up 12/24 see above 19. Restless leg syndrome: continue Requip 20 Chronic constipation- will schedule Suppository every other day after dinner.    12/24 had bm 12/23   21. Anxiety- new. Often pain related  12/22- likely tramadol- will stop and give vistaril 10 mg q8 hours prn.   12/23- resolved off tramadol.  22. Insomnia  12/26- will con't Melatonin 3 mg QHS prn and add Trazodone 50 mg QHS.   LOS: 6 days A FACE TO FACE EVALUATION WAS PERFORMED  Tanza Pellot 07/22/2021, 10:15 AM

## 2021-07-22 NOTE — Progress Notes (Signed)
Occupational Therapy Session Note  Patient Details  Name: Antavia Remmel MRN: [id number] Date of Birth: [birth year]  Today's Date: 07/22/2021 OT Individual Time: 1400-1425 OT Individual Time Calculation (min): 25 min    Short Term Goals: Week 1:  OT Short Term Goal 1 (Week 1): STGs = LTGs 2/2 ELOS at Supervision  Skilled Therapeutic Interventions/Progress Updates:    Pt sitting EOB upon arrival. OT intervention with focus on continued discharge planning, functional amb with RW, and safety awareness to increase independence with BADLs. Amb with RW in room and hallway with supervision. Discussed bed arrangement. Pt currently using stool to get into bed but may have daughter remove pillow top mattress. Pt returned to bed and remained in bed with all needs within reach and bed alarm activated.   Therapy Documentation Precautions:  Precautions Precautions: Fall Precaution Comments: rib fxs (R4, 6-8 and L 6-9th), expressive aphasia Restrictions Weight Bearing Restrictions: No Pain: Pt denies pain this afternoon  Therapy/Group: Individual Therapy  Leroy Libman 07/22/2021, 2:45 PM

## 2021-07-22 NOTE — Progress Notes (Signed)
Occupational Therapy Session Note  Patient Details  Name: Stacey Dorsey MRN: [id number] Date of Birth: Feb 27, 1933  Today's Date: 07/22/2021 OT Individual Time: 6184-8592 OT Individual Time Calculation (min): 70 min    Short Term Goals: Week 1:  OT Short Term Goal 1 (Week 1): STGs = LTGs 2/2 ELOS at Supervision  Skilled Therapeutic Interventions/Progress Updates:    Pt sleeping in bed upon arrival but easily aroused. Pt completed bathing,dressing, and toileting in room with CGA when amb and standing for LB dressing. Pt transitioned to ADL apartment to practice furniture transfers and doffing/donning shoes from lower surface height. CGA for amb and supervision for doffing/donning shoes. Pt practiced TTB transfers with CGA. Handout on TTB provided to pt. Pt returned to room and requested to return to bed to rest since she reported she didn't sleep well the previous night. Bed alarm activated. All needs within reach.  Therapy Documentation Precautions:  Precautions Precautions: Fall Precaution Comments: rib fxs (R4, 6-8 and L 6-9th), expressive aphasia Restrictions Weight Bearing Restrictions: No  Pain: Pt c/o Bil rib discomfort with transitional movements; repositioned and rest  Therapy/Group: Individual Therapy  Leroy Libman 07/22/2021, 9:28 AM

## 2021-07-22 NOTE — Progress Notes (Signed)
Patient ID: Stacey Dorsey, female   DOB: [birth year], 87 y.o.   MRN: [id number] S: no c/o, was up walking w/ a rolling walker. In good spirits.   O:BP (!) 152/52 (BP Location: Left Arm)    Pulse 63    Temp 97.7 F (36.5 C)    Resp 18    Ht 5\' 9"  (1.753 m)    Wt 60.6 kg    SpO2 100%    BMI 19.73 kg/m   Intake/Output Summary (Last 24 hours) at 07/22/2021 1505 Last data filed at 07/22/2021 2952 Gross per 24 hour  Intake 280 ml  Output --  Net 280 ml    Intake/Output: I/O last 3 completed shifts: In: 477 [P.O.:477] Out: -   Intake/Output this shift:  Total I/O In: 40 [P.O.:40] Out: -  Weight change:  Gen:NAD CVS: RRR Resp:CTA Abd: +BS, soft, NT/ND Ext: no edema, LUE AVG +T/B   Recent Labs  Lab 07/16/21 0313 07/18/21 1400 07/21/21 1000  NA 137 137 138  K 4.7 4.2 4.4  CL 99 102 103  CO2 24 24 25   GLUCOSE 115* 117* 137*  BUN 54* 53* 51*  CREATININE 7.50* 7.10* 8.86*  ALBUMIN 2.7* 3.0* 2.7*  CALCIUM 8.7* 8.6* 8.8*  PHOS 6.1* 4.5 4.6    Liver Function Tests: Recent Labs  Lab 07/16/21 0313 07/18/21 1400 07/21/21 1000  ALBUMIN 2.7* 3.0* 2.7*    No results for input(s): LIPASE, AMYLASE in the last 168 hours. No results for input(s): AMMONIA in the last 168 hours. CBC: Recent Labs  Lab 07/16/21 0313 07/21/21 1000  WBC 8.6 7.9  HGB 9.1* 8.8*  HCT 28.8* 28.4*  MCV 90.6 91.6  PLT 260 342    Cardiac Enzymes: No results for input(s): CKTOTAL, CKMB, CKMBINDEX, TROPONINI in the last 168 hours. CBG: Recent Labs  Lab 07/16/21 0639 07/16/21 1121 07/16/21 1549 07/16/21 2102 07/17/21 0617  GLUCAP 124* 122* 134* 187* 106*     Iron Studies: No results for input(s): IRON, TIBC, TRANSFERRIN, FERRITIN in the last 72 hours. Studies/Results: No results found.  allopurinol  100 mg Oral Daily   aspirin  325 mg Oral Daily   bisacodyl  10 mg Rectal QODAY   carvedilol  25 mg Oral BID WC   cloNIDine  0.1 mg Oral BID   clopidogrel  75 mg Oral Daily   darbepoetin  (ARANESP) injection - DIALYSIS  60 mcg Intravenous Q Sat-HD   feeding supplement (NEPRO CARB STEADY)  237 mL Oral BID   ferric citrate  210 mg Oral TID WC   heparin  5,000 Units Subcutaneous Q8H   lidocaine  2 patch Transdermal Q24H   lidocaine-prilocaine  1 application Topical Once per day on Mon Wed Fri   mometasone-formoterol  2 puff Inhalation BID   multivitamin  1 tablet Oral QHS   NIFEdipine  60 mg Oral QHS   pantoprazole  40 mg Oral Daily   rOPINIRole  0.5 mg Oral QHS   senna-docusate  2 tablet Oral BID   simvastatin  20 mg Oral QHS   traZODone  50 mg Oral QHS   Dialysis Orders:  [facility]  3.5h   64.5kg  2/2 bath  400/500   LUA AVG  Hep none  Sensipar 30 ug tiw po Hectorol 5 ug tiw IV Mircera 75 ug q 2wks , last 12/01  Venofer 50 mg q. Weekly       Assessment/Plan: Acute CVA - L MCA by CT and MRI, +aphasia,  on DAPT. Microembolic infarctions. EP consulted for loop recorder.  Now in CIR. PEA arrest - Out of hospital, per primary.   ESRD - TTS HD. Pt refusing HD today (rollover from the weekend). Plan HD tomorrow to get back on schedule.  Rib fractures/ chest pain - due to CPR and still complaining of intermittent chest pain. Pneumonia- improved, completed course of Rocephin/ azithro IV here HTN/vol - BP's high. Coreg/ procardia as at home, clonidine added. 3-4 kg under dry. Increased procardia to 60 qd. Uf'ing as tolerated Anemia-  Hb 9- 10. Weekly Aranesp 60ug on thursdays, last 12/15. Restarted, dose 12/24 (or w/ next HD) CKD-BMD- Ca ok, P rising, resumed auryxia-phos better now AMS with tramadol.  Avoid in the future.   Kelly Splinter, MD 07/22/2021, 3:06 PM

## 2021-07-23 LAB — RENAL FUNCTION PANEL
Albumin: 3.1 g/dL — ABNORMAL LOW (ref 3.5–5.0)
Anion gap: 11 (ref 5–15)
BUN: 24 mg/dL — ABNORMAL HIGH (ref 8–23)
CO2: 28 mmol/L (ref 22–32)
Calcium: 9.1 mg/dL (ref 8.9–10.3)
Chloride: 99 mmol/L (ref 98–111)
Creatinine, Ser: 4.95 mg/dL — ABNORMAL HIGH (ref 0.44–1.00)
GFR, Estimated: 8 mL/min — ABNORMAL LOW (ref >60)
Glucose, Bld: 189 mg/dL — ABNORMAL HIGH (ref 70–99)
Phosphorus: 2.5 mg/dL (ref 2.5–4.6)
Potassium: 3.3 mmol/L — ABNORMAL LOW (ref 3.5–5.1)
Sodium: 138 mmol/L (ref 135–145)

## 2021-07-23 LAB — CBC
HCT: 32.1 % — ABNORMAL LOW (ref 36.0–46.0)
Hemoglobin: 10.5 g/dL — ABNORMAL LOW (ref 12.0–15.0)
MCH: 29.5 pg (ref 26.0–34.0)
MCHC: 32.7 g/dL (ref 30.0–36.0)
MCV: 90.2 fL (ref 80.0–100.0)
Platelets: 305 10*3/uL (ref 150–400)
RBC: 3.56 MIL/uL — ABNORMAL LOW (ref 3.87–5.11)
RDW: 17.9 % — ABNORMAL HIGH (ref 11.5–15.5)
WBC: 7.4 10*3/uL (ref 4.0–10.5)
nRBC: 0 % (ref 0.0–0.2)

## 2021-07-23 NOTE — Progress Notes (Signed)
Occupational Therapy Session Note  Patient Details  Name: Stacey Dorsey MRN: [id number] Date of Birth: 11/02/1932  Today's Date: 07/23/2021 OT Individual Time: 5110-2111 OT Individual Time Calculation (min): 75 min  and Today's Date: 07/23/2021 OT Missed Time: 15 Minutes Missed Time Reason: Patient fatigue   Short Term Goals: Week 1:  OT Short Term Goal 1 (Week 1): STGs = LTGs 2/2 ELOS at Supervision  Skilled Therapeutic Interventions/Progress Updates:    Pt sleeping upon arrival but easily aroused. Pt stated she didn't sleep well during the night but agreeable to getting OOB. Supine>sit EOB with supervision. Amb with RW to w/c at sink for grooming tasks prior to leaving room. Pt transported to Day room (time mgmt). NuStep 7 mins level 4. Amb in Day Room with CGA. Standing activities at table with CGA. Reviewed pictures of bed and bed height. Height of bed without mattress topper is 27" Pt was using stool to kneel onto to "climb" into bed prior to admission. Strongly recommended removing mattress topper and NOT using stool to kneel onto. Pt fatigued and returned to room. Pt amb with RW from doorway to EOB and returned to bed. Bed activiated all needs within reach.  Therapy Documentation Precautions:  Precautions Precautions: Fall Precaution Comments: rib fxs (R4, 6-8 and L 6-9th), expressive aphasia Restrictions Weight Bearing Restrictions: No General: General OT Amount of Missed Time: 15 Minutes Pain: Pain Assessment Pain Scale: 0-10 Pain Score: 0-No pain   Therapy/Group: Individual Therapy  Leroy Libman 07/23/2021, 9:45 AM

## 2021-07-23 NOTE — Discharge Instructions (Addendum)
Inpatient Rehab Discharge Instructions  Stacey Dorsey Discharge date and time: 07/26/2021  Activities/Precautions/ Functional Status: Activity: activity as tolerated Diet: renal diet, heart healthy Wound Care: none needed Functional status:  ___ No restrictions     ___ Walk up steps independently ___ 24/7 supervision/assistance   ___ Walk up steps with assistance ___ Intermittent supervision/assistance  ___ Bathe/dress independently ___ Walk with walker     __x_ Bathe/dress with assistance ___ Walk Independently    ___ Shower independently ___ Walk with assistance    ___ Shower with assistance __x_ No alcohol     ___ Return to work/school ________  COMMUNITY REFERRALS UPON DISCHARGE:    Home Health:   PT     OT                     Agency:[facility] *Please expect follow-up within 2-3 business days to schedule your home visit. If you have not received follow-up, be sure to contact the branch directly.*   Medical Equipment/Items Ordered:rolling walker                                                   Agency/Supplier:[facility] [phone]    Special Instructions:  1) No driving, alcohol intake or tobacco intake.  2) Follow-up with EP cardiology for 30 day event monitor on September 23, 2021 at 9:00 am.  (Monitor will be delivered to your home.)   3) Follow-up bilateral carotid duplex in one year.  STROKE/TIA DISCHARGE INSTRUCTIONS SMOKING Cigarette smoking nearly doubles your risk of having a stroke & is the single most alterable risk factor  If you smoke or have smoked in the last 12 months, you are advised to quit smoking for your health. Most of the excess cardiovascular risk related to smoking disappears within a year of stopping. Ask you doctor about anti-smoking medications [place] Quit Line: 1-800-QUIT NOW Free Smoking Cessation Classes [phone]  CHOLESTEROL Know your levels; limit fat & cholesterol in your diet  Lipid Panel      Component Value Date/Time   CHOL 139 07/10/2021 0418   TRIG 92 07/10/2021 0418   HDL 48 07/10/2021 0418   CHOLHDL 2.9 07/10/2021 0418   VLDL 18 07/10/2021 0418   LDLCALC 73 07/10/2021 0418     Many patients benefit from treatment even if their cholesterol is at goal. Goal: Total Cholesterol (CHOL) less than 160 Goal:  Triglycerides (TRIG) less than 150 Goal:  HDL greater than 40 Goal:  LDL (LDLCALC) less than 100   BLOOD PRESSURE American Stroke Association blood pressure target is less that 120/80 mm/Hg  Your discharge blood pressure is:  BP: (!) 158/103 Monitor your blood pressure Limit your salt and alcohol intake Many individuals will require more than one medication for high blood pressure  DIABETES (A1c is a blood sugar average for last 3 months) Goal HGBA1c is under 7% (HBGA1c is blood sugar average for last 3 months)  Diabetes: No known diagnosis of diabetes    Lab Results  Component Value Date   HGBA1C 5.7 (H) 07/10/2021    Your HGBA1c can be lowered with medications, healthy diet, and exercise. Check your blood sugar as directed by your physician Call your physician if you experience unexplained or low blood sugars.  PHYSICAL ACTIVITY/REHABILITATION Goal is 30 minutes  at least 4 days per week  Activity: Increase activity slowly, Therapies: Physical Therapy: Home Health, Occupational Therapy: Home Health, and Speech Therapy: Home Health Return to work: n/a Activity decreases your risk of heart attack and stroke and makes your heart stronger.  It helps control your weight and blood pressure; helps you relax and can improve your mood. Participate in a regular exercise program. Talk with your doctor about the best form of exercise for you (dancing, walking, swimming, cycling).  DIET/WEIGHT Goal is to maintain a healthy weight  Your discharge diet is:  Diet Order             Diet renal with fluid restriction Fluid restriction: 1200 mL Fluid; Room service appropriate?  Yes; Fluid consistency: Thin  Diet effective now                  thin liquids Your height is:  Height: 5\' 9"  (175.3 cm) Your current weight is: Weight: 60 kg Your Body Mass Index (BMI) is:  BMI (Calculated): 19.52 Following the type of diet specifically designed for you will help prevent another stroke. Your goal weight range is:  60kg Your goal Body Mass Index (BMI) is 19-24. Healthy food habits can help reduce 3 risk factors for stroke:  High cholesterol, hypertension, and excess weight.  RESOURCES Stroke/Support Group:  Call [phone]   STROKE EDUCATION PROVIDED/REVIEWED AND GIVEN TO PATIENT Stroke warning signs and symptoms How to activate emergency medical system (call 911). Medications prescribed at discharge. Need for follow-up after discharge. Personal risk factors for stroke. Pneumonia vaccine given: No Flu vaccine given: No My questions have been answered, the writing is legible, and I understand these instructions.  I will adhere to these goals & educational materials that have been provided to me after my discharge from the hospital.      My questions have been answered and I understand these instructions. I will adhere to these goals and the provided educational materials after my discharge from the hospital.  Patient/Caregiver Signature _______________________________ Date __________  Clinician Signature _______________________________________ Date __________  Please bring this form and your medication list with you to all your follow-up doctor's appointments.

## 2021-07-23 NOTE — Progress Notes (Signed)
Physical Therapy Session Note  Patient Details  Name: Stacey Dorsey MRN: [id number] Date of Birth: [birth year]  Today's Date: 07/23/2021 PT Individual Time: 1000-1055 PT Individual Time Calculation (min): 55 min   Short Term Goals: Week 1:  PT Short Term Goal 1 (Week 1): =LTG due to ELOS  Skilled Therapeutic Interventions/Progress Updates:    Pt received seated EOB, agreeable to PT session. Pt reports her rib pain is well-controlled this AM. Sit to stand to Supervision to RW during session. Ambulation 2 x 150 ft with RW and Supervision. Pt able to make bed in therapy apartment with no AD and Supervision level for balance. Sit to/from supine on real bed in therapy apartment at Supervision level (26" bed). Per photo's pt's daughter has provided her bed at home is 27" without mattress topper and 31" with mattress topper. Encouraged pt to have her family remove mattress topper for increased safety and independence and safety with bed mobility. Ascend/descend one 6" step with CGA to min A for balance 3 x 8 reps to simulate home environment, cues for safe LE placement and sequencing, no AD. Standing mini-squats 2 x 10 reps with RW and CGA for balance, mod cueing for correct exercise performance. Pt reports feeling very fatigued this date despite sleeping well overnight. Pt left seated EOB with needs in reach at end of session, bed alarm in place.  Therapy Documentation Precautions:  Precautions Precautions: Fall Precaution Comments: rib fxs (R4, 6-8 and L 6-9th), expressive aphasia Restrictions Weight Bearing Restrictions: No      Therapy/Group: Individual Therapy   Excell Seltzer, PT, DPT, CSRS  07/23/2021, 12:15 PM

## 2021-07-23 NOTE — Progress Notes (Signed)
Occupational Therapy Note  Patient Details  Name: Stacey Dorsey MRN: [id number] Date of Birth: 1933-07-08  Today's Date: 07/23/2021 OT Missed Time: 41 Minutes Missed Time Reason: Patient fatigue  Pt resting in bed upon arrival. Pt reports that she is "exhausted" from earlier therapies and she has to leave for dialysis soon. Encouraged pt to "take a walk" but pt declined. Pt missed 30 mins skilled OT services.    Leotis Shames [facility] 07/23/2021, 11:47 AM

## 2021-07-23 NOTE — Procedures (Signed)
° °  I was present at this dialysis session, have reviewed the session itself and made  appropriate changes Kelly Splinter MD [place] pager [phone]   07/23/2021, 3:43 PM

## 2021-07-23 NOTE — Patient Care Conference (Signed)
Inpatient RehabilitationTeam Conference and Plan of Care Update Date: 07/23/2021   Time: 11:00 AM    Patient Name: Stacey Dorsey      Medical Record Number: [id number]  Date of Birth: 1932-11-29 Sex: Female         Room/Bed: 4M11C/4M11C-01 Payor Info: Payor: [employer] MEDICARE / Plan: [facility] MEDICARE / Product Type: *No Product type* /    Admit Date/Time:  07/16/2021  4:13 PM  Primary Diagnosis:  Acute ischemic left MCA stroke [facility])  Hospital Problems: Principal Problem:   Acute ischemic left MCA stroke [facility])    Expected Discharge Date: Expected Discharge Date: 07/26/21  Team Members Present: Physician leading conference: Dr. Courtney Heys Social Worker Present: Loralee Pacas, [place] Nurse Present: Dorthula Nettles, RN PT Present: Excell Seltzer, PT OT Present: Roanna Epley, [place], OT SLP Present: Lillie Columbia, SLP PPS Coordinator present : Ileana Ladd, PT     Current Status/Progress Goal Weekly Team Focus  Bowel/Bladder   pt cont of ba and b scheduled suppositories  remain cont of b and b  assess q shift and prn   Swallow/Nutrition/ Hydration             ADL's   CGA/min A LB dressing; CGA shower transfers; CGA for tranfsers and bathing  supervision overall  activity tolerace, safety awarenss, education   Mobility   CGA bed mobility, CGA transfers with RW, close Supervision to CGA gait up to 200 ft, min A stairs no handrails  Supervision overall, min A stairs no handrails  balance, endurance, stairs, functional transfers   Communication   min A expressive language  supervision expressive language, mod I receptive language  word finding tasks/strategies   Safety/Cognition/ Behavioral Observations  Supervision A  mod I  functional problem solving and memory   Pain   pt c/o 5/10 pain in ribs  decrease pain <2  assess q 4 hr and prn, administer sched pain medication   Skin   no skin issues  no breakdown  assess skin q shift and prn     Discharge  Planning:  Pt to d/c to home with her dtr who will provide 24/7 care. SW will confirm there are no barriers to d/c.   Team Discussion: Complains of lack of sleep. Trazodone given last night and slept better. Will have HD today. Needs to ask for pain medication. Continent bladder/incontinent bowel, time toilet schedule. Reports rib pain but doesn't ask for pain medication. Discharging home with daughter who will take her to HD. Working on getting in/out of bed. Plans to modify bed at home. Has expressive and receptive language difficulties.  Patient on target to meet rehab goals: yes, supervision goals overall, min assist for 1 step. Supervision goals with ADL's. Supervision goals for expressive language, mod I goals for receptive language.  *See Care Plan and progress notes for long and short-term goals.   Revisions to Treatment Plan:  Not at this time.  Teaching Needs: Family education, medication/pain management, bowel management, transfer/gait training, etc.  Current Barriers to Discharge: Decreased caregiver support, Home enviroment access/layout, Incontinence, Lack of/limited family support, Hemodialysis, and Medication compliance  Possible Resolutions to Barriers: Family education Follow-up PT/OT/SLP or HEP Order recommended DME     Medical Summary Current Status: s/p PEA arrest- s/p 15 minutes CPR; and  stroke continent bladder; incontinent Bowel- skin good- pain from rib fx's- not taking pain meds; HD T/H/S  Barriers to Discharge: Decreased family/caregiver support;Hemodialysis;Home enviroment access/layout;Incontinence;Medical stability;Neurogenic Bowel & Bladder  Barriers to  Discharge Comments: pain, from rib fx's and rails/stairs biggest limiters-  is supervision/CGA with therapy Possible Resolutions to [employer] Focus: word finiding issues/expressive language-  d/c Friday 12/30   Continued Need for Acute Rehabilitation Level of Care: The patient requires daily medical  management by a physician with specialized training in physical medicine and rehabilitation for the following reasons: Direction of a multidisciplinary physical rehabilitation program to maximize functional independence : Yes Medical management of patient stability for increased activity during participation in an intensive rehabilitation regime.: Yes Analysis of laboratory values and/or radiology reports with any subsequent need for medication adjustment and/or medical intervention. : Yes   I attest that I was present, lead the team conference, and concur with the assessment and plan of the team.   Cristi Loron 07/23/2021, 4:32 PM

## 2021-07-23 NOTE — Discharge Summary (Signed)
Physician Discharge Summary  Patient ID: Stacey Dorsey MRN: [id number] DOB/AGE: 1933/04/14 87 y.o.  Admit date: 07/16/2021 Discharge date: 07/26/2021  Discharge Diagnoses:  Principal Problem:   Acute ischemic left MCA stroke [facility]) Active problems: Functional deficits due to stroke ESRD on chronic HD Rib fractures Hypertension Anemia Insomnia Chronic constipation Carotid stenosis  Discharged Condition: good  Significant Diagnostic Studies: DG Abd 1 View  Result Date: 07/04/2021 CLINICAL DATA:  OG tube. EXAM: ABDOMEN - 1 VIEW COMPARISON:  CT chest 07/04/2021. FINDINGS: Orogastric tube tip is at the gastric fundus. No dilated bowel loops are seen. Small right pleural effusion and right basilar infiltrates persist. Endotracheal tube tip is 2 cm above the carina. Cardiomediastinal silhouette within normal limits. IMPRESSION: 1. Orogastric tube tip in the gastric fundus. Electronically Signed   By: Ronney Asters M.D.   On: 07/04/2021 18:36   CT HEAD WO CONTRAST (5MM)  Result Date: 07/07/2021 CLINICAL DATA:  87 year old female TIA. EXAM: CT HEAD WITHOUT CONTRAST TECHNIQUE: Contiguous axial images were obtained from the base of the skull through the vertex without intravenous contrast. COMPARISON:  Head CT 07/04/2021. FINDINGS: Brain: Stable bilateral basal ganglia vascular calcifications. Stable encephalomalacia in the left middle frontal gyrus along the anterior insula and operculum. Elsewhere gray-white matter differentiation remains normal for age. No superimposed midline shift, ventriculomegaly, mass effect, evidence of mass lesion, intracranial hemorrhage or evidence of cortically based acute infarction. Vascular: Calcified atherosclerosis at the skull base. No suspicious intracranial vascular hyperdensity. Skull: Questionable chronic left orbital floor fracture. No acute osseous abnormality identified. Sinuses/Orbits: Visualized paranasal sinuses and mastoids are stable and well aerated.  Other: No acute orbit or scalp soft tissue finding. IMPRESSION: 1. Stable non contrast CT appearance of the brain. No acute or evolving infarct is identified. 2. Stable chronic appearing left MCA anterior division territory infarct. Electronically Signed   By: Genevie Ann M.D.   On: 07/07/2021 11:56   CT Head Wo Contrast  Result Date: 07/04/2021 CLINICAL DATA:  Altered mental status EXAM: CT HEAD WITHOUT CONTRAST TECHNIQUE: Contiguous axial images were obtained from the base of the skull through the vertex without intravenous contrast. COMPARISON:  None. FINDINGS: Brain: There are no signs of bleeding within the cranium. There is moderate sized area of decreased density in the left frontal lobe suggesting old infarct. Cortical sulci are prominent, more so in the left temporal region. Calcifications are seen in the basal ganglia on both sides. Vascular: There are scattered arterial calcifications. Skull: Unremarkable Sinuses/Orbits: There are no air-fluid levels in the paranasal sinuses. There is mild mucosal thickening in the ethmoid and sphenoid sinuses. Secretions are seen in the lumen of nasopharynx. Other: There is increased amount of CSF insula suggesting partial empty sella. IMPRESSION: No acute intracranial findings are seen in noncontrast CT brain. 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 Atrophy. Electronically Signed   By: Elmer Picker M.D.   On: 07/04/2021 10:49   CT Angio Chest PE W and/or Wo Contrast  Result Date: 07/04/2021 CLINICAL DATA:  CPR with ROSC obtained, suspect PE EXAM: CT ANGIOGRAPHY CHEST WITH CONTRAST TECHNIQUE: Multidetector CT imaging of the chest was performed using the standard protocol during bolus administration of intravenous contrast. Multiplanar CT image reconstructions and MIPs were obtained to evaluate the vascular anatomy. CONTRAST:  147mL OMNIPAQUE IOHEXOL 350 MG/ML SOLN COMPARISON:  Same-day chest radiograph, CT chest 01/01/2021 FINDINGS:  Cardiovascular: There is adequate opacification of the pulmonary arteries to the subsegmental level. There is no evidence of pulmonary  embolism. The heart is enlarged. There are mitral annular calcifications, mild aortic valve calcifications, and scattered coronary artery calcifications. There is mild calcified atherosclerotic plaque in the nonaneurysmal thoracic aorta. There is reflux of contrast into the IVC suggesting right heart dysfunction. Mediastinum/Nodes: The thyroid is unremarkable. The esophagus is grossly unremarkable. There is no mediastinal, hilar, or axillary lymphadenopathy. Lungs/Pleura: The endotracheal tube tip is approximally 6 mm from the carina and approaches the right main bronchus. The trachea and central airways are patent. There is consolidation in the dependent lower lobes, right significantly worse than left. There are additional patchy opacities and interlobular septal thickening in the lung apices and left lung base. There are small bilateral pleural effusions. There is no pneumothorax. Upper Abdomen: The enteric catheter tip is in the distal stomach. A right adrenal adenoma is unchanged there are no acute findings in the upper abdomen. Musculoskeletal: There are acute fractures of the right fourth and sixth through eighth ribs and left sixth through ninth ribs. Review of the MIP images confirms the above findings. IMPRESSION: 1. No evidence of pulmonary embolism. 2. Findings above suggesting heart failure including cardiomegaly, patchy ground-glass opacities and interlobular septal thickening likely reflecting mild to moderate pulmonary interstitial edema, and small bilateral pleural effusions. Additionally, reflux of contrast into the IVC suggests right heart dysfunction. 3. Consolidations in the bilateral lower lobes may reflect atelectasis, though pneumonia can not be excluded. 4. Multiple bilateral rib fractures likely related to CPR. 5. Endotracheal tube tip approaching the right  main bronchus. Recommend retraction by approximately 1-2 cm. Electronically Signed   By: Valetta Mole M.D.   On: 07/04/2021 13:21   MR ANGIO HEAD WO CONTRAST  Result Date: 07/09/2021 CLINICAL DATA:  Stroke follow-up EXAM: MRA HEAD WITHOUT CONTRAST TECHNIQUE: Angiographic images of the Circle of Willis were acquired using MRA technique without intravenous contrast. COMPARISON:  MRI 07/08/2021 FINDINGS: Evaluation is somewhat limited by motion artifact. Anterior circulation: Both internal carotid arteries are patent to the termini, although there is moderate to severe narrowing in the left supraclinoid portion (series 3, image 75). Hypoplastic or aplastic left A1. Normal right A1. Normal anterior communicating artery. Anterior cerebral arteries are patent to their distal aspects. No M1 stenosis or occlusion. Normal MCA bifurcations. Distal MCA branches perfused, although there is decreased perfusion in the left distal MCA branches, without focal stenosis or occlusion. Posterior circulation: Multifocal irregularity in the right vertebral artery, which appears occluded just proximal to the vertebrobasilar junction. The left vertebral artery is dominant and patent to the vertebrobasilar junction without stenosis. Basilar patent to its distal aspect. Superior cerebellar arteries patent bilaterally. PCAs perfused to their distal aspects without stenosis. The right posterior communicating artery is patent. The left posterior communicating artery is not definitively seen. Anatomic variants: None significant Other: None. IMPRESSION: 1. Evaluation is limited by motion artifact. Within this limitation, there is moderate to severe narrowing in the left supraclinoid ICA. Decreased perfusion in the left distal MCA branches, without focal MCA stenosis or occlusion. 2. Multifocal narrowing of the right vertebral artery, which appears occluded just proximal to the vertebrobasilar junction. Electronically Signed   By: Merilyn Baba M.D.   On: 07/09/2021 20:15   MR BRAIN WO CONTRAST  Result Date: 07/08/2021 CLINICAL DATA:  Neuro deficit, acute, stroke suspected. EXAM: MRI HEAD WITHOUT CONTRAST TECHNIQUE: Multiplanar, multiecho pulse sequences of the brain and surrounding structures were obtained without intravenous contrast. COMPARISON:  Head CT yesterday. FINDINGS: Brain: Diffusion imaging shows approximately 10 punctate acute  infarctions scattered within the left MCA territory consistent with micro embolic infarctions. No large or confluent acute infarction. No focal abnormality affects the brainstem or cerebellum. Old small vessel infarction in the right thalamus. Mild chronic small-vessel ischemic changes elsewhere within the cerebral hemispheric white matter. 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 No mass, hydrocephalus or extra-axial collection. Vascular: Major vessels at the base of the brain show flow. Skull and upper cervical spine: Negative Sinuses/Orbits: Clear/normal Other: Small right mastoid effusion. IMPRESSION: Approximately 10 punctate acute infarctions scattered within the left MCA territory consistent with micro embolic infarctions. 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 Mild chronic small-vessel ischemic changes elsewhere within the cerebral hemispheric white matter. Old lacunar infarction right thalamus. Electronically Signed   By: Nelson Chimes M.D.   On: 07/08/2021 19:52   DG Chest Port 1 View  Result Date: 07/06/2021 CLINICAL DATA:  Acute respiratory failure with hypoxia. EXAM: PORTABLE CHEST 1 VIEW COMPARISON:  July 05, 2021. FINDINGS: Stable cardiomegaly. Endotracheal and nasogastric tubes have been removed. Bibasilar atelectasis is noted, right greater than left. Small pleural effusions may be present. Bony thorax is unremarkable. IMPRESSION: Endotracheal and nasogastric tubes have been removed. Stable bibasilar atelectasis is noted with small  pleural effusions. Electronically Signed   By: Marijo Conception M.D.   On: 07/06/2021 09:18   DG Chest Port 1 View  Result Date: 07/05/2021 CLINICAL DATA:  Difficulty breathing EXAM: PORTABLE CHEST 1 VIEW COMPARISON:  Previous studies including the examination of 07/04/2021 FINDINGS: Transverse diameter of heart is increased. There is interval decrease in pulmonary vascular congestion. There is improvement in aeration of parahilar regions and lower lung fields. Residual increased markings are seen in the medial lower lung fields. There is blunting of both lateral CP angles. There is no pneumothorax. Tip of endotracheal tube is 2.9 cm above the carina. Tip of enteric tube is seen in the fundus of the stomach. IMPRESSION: There is interval decrease in pulmonary vascular congestion and pulmonary edema. Increased markings in the medial lower lung fields may suggest crowding of normal bronchovascular structures due to poor inspiration or atelectasis/pneumonitis. Small bilateral pleural effusions are seen. Electronically Signed   By: Elmer Picker M.D.   On: 07/05/2021 08:13   DG Chest Portable 1 View  Result Date: 07/04/2021 CLINICAL DATA:  Code, post intubation EXAM: PORTABLE CHEST 1 VIEW COMPARISON:  01/01/2021 FINDINGS: Endotracheal tube is 3 cm above the carina. NG tube tip is near the GE junction. No confluent opacity on the left. Patchy airspace disease in the right perihilar region and right lower lobe with volume loss and elevation of the right hemidiaphragm. No visible effusions or pneumothorax. No acute bony abnormality. IMPRESSION: Airspace disease in the right perihilar region and right lower lobe concerning for pneumonia. Volume loss on the right. Electronically Signed   By: Rolm Baptise M.D.   On: 07/04/2021 10:13   EEG adult  Result Date: 07/09/2021 Lora Havens, MD     07/09/2021  8:15 AM Patient Name: Stacey Dorsey MRN: [id number] Epilepsy Attending: Lora Havens Referring  Physician/Provider: Anibal Henderson, NP Date: 07/08/2021 Duration: 23.01 mins Patient history: 87 y.o. female who presented to the hospital 12/8 for evaluation of out-of-hospital cardiac arrest with 15 minutes of CPR prior to ROSC.  EEG to evaluate for seizure. Level of alertness: Awake AEDs during EEG study: None Technical aspects: This EEG study was done with scalp electrodes positioned according to the 10-20 International system  of electrode placement. Electrical activity was acquired at a sampling rate of 500Hz  and reviewed with a high frequency filter of 70Hz  and a low frequency filter of 1Hz . EEG data were recorded continuously and digitally stored. Description: The posterior dominant rhythm consists of 9 Hz activity of moderate voltage (25-35 uV) seen predominantly in posterior head regions, symmetric and reactive to eye opening and eye closing. EEG showed intermittent generalized 3 to 6 Hz theta-delta slowing. Hyperventilation and photic stimulation were not performed.   ABNORMALITY - Intermittent slow, generalized IMPRESSION: This study is suggestive of mild diffuse encephalopathy, nonspecific etiology. No seizures or epileptiform discharges were seen throughout the recording. Lora Havens   ECHOCARDIOGRAM COMPLETE  Result Date: 07/04/2021    ECHOCARDIOGRAM REPORT   Patient Name:   Stacey Dorsey Date of Exam: 07/04/2021 Medical Rec #:  [id number]     Height:       69.0 in Accession #:    [id number]    Weight:       176.4 lb Date of Birth:  01-10-1933     BSA:          1.958 m Patient Age:    65 years      BP:           173/87 mmHg Patient Gender: F             HR:           69 bpm. Exam Location:  Inpatient Procedure: 2D Echo Indications:    cardiac arrest  History:        Patient has no prior history of Echocardiogram examinations.                 Kidney disease; Signs/Symptoms:Shortness of Breath.  Sonographer:    Johny Chess RDCS Referring Phys: [phone] [place]  1. Left  ventricular ejection fraction, by estimation, is 50 to 55%. The left ventricle has low normal function. The left ventricle has no regional wall motion abnormalities. There is moderate concentric left ventricular hypertrophy. Left ventricular diastolic parameters are consistent with Grade I diastolic dysfunction (impaired relaxation).  2. Right ventricular systolic function is normal. The right ventricular size is normal. There is severely elevated pulmonary artery systolic pressure. The estimated right ventricular systolic pressure is 75.6 mmHg.  3. Left atrial size was mildly dilated.  4. The mitral valve is degenerative. Trivial mitral valve regurgitation. Moderate mitral annular calcification.  5. The aortic valve is tricuspid. There is mild calcification of the aortic valve. There is mild thickening of the aortic valve. Aortic valve regurgitation is not visualized. Mild aortic valve stenosis.  6. The inferior vena cava is dilated in size with <50% respiratory variability, suggesting right atrial pressure of 15 mmHg. Comparison(s): No prior Echocardiogram. FINDINGS  Left Ventricle: Left ventricular ejection fraction, by estimation, is 50 to 55%. The left ventricle has low normal function. The left ventricle has no regional wall motion abnormalities. The left ventricular internal cavity size was normal in size. There is moderate concentric left ventricular hypertrophy. Left ventricular diastolic parameters are consistent with Grade I diastolic dysfunction (impaired relaxation). Right Ventricle: The right ventricular size is normal. Right vetricular wall thickness was not well visualized. Right ventricular systolic function is normal. There is severely elevated pulmonary artery systolic pressure. The tricuspid regurgitant velocity is 3.41 m/s, and with an assumed right atrial pressure of 15 mmHg, the estimated right ventricular systolic pressure is 43.3 mmHg. Left Atrium: Left atrial size was mildly  dilated. Right  Atrium: Right atrial size was normal in size. Pericardium: There is no evidence of pericardial effusion. Mitral Valve: The mitral valve is degenerative in appearance. There is moderate thickening of the mitral valve leaflet(s). There is moderate calcification of the mitral valve leaflet(s). Moderate mitral annular calcification. Trivial mitral valve regurgitation. Tricuspid Valve: The tricuspid valve is normal in structure. Tricuspid valve regurgitation is mild. Aortic Valve: The aortic valve is tricuspid. There is mild calcification of the aortic valve. There is mild thickening of the aortic valve. Aortic valve regurgitation is not visualized. Mild aortic stenosis is present. Aortic valve mean gradient measures  9.0 mmHg. Aortic valve peak gradient measures 17.5 mmHg. Aortic valve area, by VTI measures 1.07 cm. Pulmonic Valve: The pulmonic valve was not well visualized. Pulmonic valve regurgitation is trivial. Aorta: The aortic root and ascending aorta are structurally normal, with no evidence of dilitation. Venous: The inferior vena cava is dilated in size with less than 50% respiratory variability, suggesting right atrial pressure of 15 mmHg. IAS/Shunts: No atrial level shunt detected by color flow Doppler.  LEFT VENTRICLE PLAX 2D LVIDd:         3.49 cm     Diastology LVIDs:         2.70 cm     LV e' medial:    4.79 cm/s LV PW:         1.20 cm     LV E/e' medial:  18.9 LV IVS:        1.29 cm     LV e' lateral:   7.51 cm/s LVOT diam:     1.70 cm     LV E/e' lateral: 12.1 LV SV:         47 LV SV Index:   24 LVOT Area:     2.27 cm  LV Volumes (MOD) LV vol d, MOD A4C: 60.4 ml LV vol s, MOD A4C: 28.4 ml LV SV MOD A4C:     60.4 ml RIGHT VENTRICLE             IVC RV S prime:     17.00 cm/s  IVC diam: 2.20 cm TAPSE (M-mode): 2.5 cm LEFT ATRIUM             Index        RIGHT ATRIUM          Index LA diam:        4.00 cm 2.04 cm/m   RA Area:     9.67 cm LA Vol (A2C):   59.5 ml 30.38 ml/m  RA Volume:   19.10 ml 9.75  ml/m LA Vol (A4C):   59.6 ml 30.43 ml/m LA Biplane Vol: 61.7 ml 31.50 ml/m  AORTIC VALVE AV Area (Vmax):    1.06 cm AV Area (Vmean):   1.03 cm AV Area (VTI):     1.07 cm AV Vmax:           209.00 cm/s AV Vmean:          141.000 cm/s AV VTI:            0.435 m AV Peak Grad:      17.5 mmHg AV Mean Grad:      9.0 mmHg LVOT Vmax:         97.20 cm/s LVOT Vmean:        64.250 cm/s LVOT VTI:          0.206 m LVOT/AV VTI ratio: 0.47  AORTA Ao Root  diam: 2.70 cm Ao Asc diam:  3.20 cm MITRAL VALVE                TRICUSPID VALVE MV Area (PHT): 3.21 cm     TR Peak grad:   46.5 mmHg MV Decel Time: 236 msec     TR Vmax:        341.00 cm/s MV E velocity: 90.70 cm/s MV A velocity: 131.00 cm/s  SHUNTS MV E/A ratio:  0.69         Systemic VTI:  0.21 m                             Systemic Diam: 1.70 cm Gwyndolyn Kaufman MD Electronically signed by Gwyndolyn Kaufman MD Signature Date/Time: 07/04/2021/4:16:30 PM    Final    VAS US CAROTID (at [facility] and [facility] only)  Result Date: 07/09/2021 Carotid Arterial Duplex Study Patient Name:  Stacey Dorsey  Date of Exam:   07/09/2021 Medical Rec #: [id number]      Accession #:    [id number] Date of Birth: 29-Jul-1932      Patient Gender: F Patient Age:   62 years Exam Location:  [facility] Procedure:      VAS US CAROTID Referring Phys: Amie [place] --------------------------------------------------------------------------------  Indications:       CVA. Risk Factors:      None. Limitations        Today's exam was limited due to the patient's respiratory                    variation and patient positioning, patient anatomy. Comparison Study:  No prior studies. Performing Technologist: Oliver Hum RVT  Examination Guidelines: A complete evaluation includes B-mode imaging, spectral Doppler, color Doppler, and power Doppler as needed of all accessible portions of each vessel. Bilateral testing is considered an integral part of a complete examination. Limited examinations for reoccurring  indications may be performed as noted.  Right Carotid Findings: +----------+--------+--------+--------+-----------------------+--------+             PSV cm/s EDV cm/s Stenosis Plaque Description      Comments  +----------+--------+--------+--------+-----------------------+--------+  CCA Prox   120      14                smooth and heterogenous           +----------+--------+--------+--------+-----------------------+--------+  CCA Distal 111      16                smooth and heterogenous           +----------+--------+--------+--------+-----------------------+--------+  ICA Prox   260      52       40-59%   calcific                          +----------+--------+--------+--------+-----------------------+--------+  ICA Mid    178      31                smooth and heterogenous           +----------+--------+--------+--------+-----------------------+--------+  ICA Distal 101      26                                        tortuous  +----------+--------+--------+--------+-----------------------+--------+  ECA  258      25                                                  +----------+--------+--------+--------+-----------------------+--------+ +----------+--------+-------+--------+-------------------+             PSV cm/s EDV cms Describe Arm Pressure (mmHG)  +----------+--------+-------+--------+-------------------+  Subclavian 113                                            +----------+--------+-------+--------+-------------------+ +---------+--------+--+--------+-+---------+  Vertebral PSV cm/s 61 EDV cm/s 7 Antegrade  +---------+--------+--+--------+-+---------+  Left Carotid Findings: +----------+--------+--------+--------+-----------------------+--------+             PSV cm/s EDV cm/s Stenosis Plaque Description      Comments  +----------+--------+--------+--------+-----------------------+--------+  CCA Prox   69       7                 smooth and heterogenous            +----------+--------+--------+--------+-----------------------+--------+  CCA Distal 75       10                smooth and heterogenous           +----------+--------+--------+--------+-----------------------+--------+  ICA Prox   68       14                smooth and heterogenous           +----------+--------+--------+--------+-----------------------+--------+  ICA Distal 62       17                                        tortuous  +----------+--------+--------+--------+-----------------------+--------+  ECA        148      10                                                  +----------+--------+--------+--------+-----------------------+--------+ +----------+--------+--------+--------+-------------------+             PSV cm/s EDV cm/s Describe Arm Pressure (mmHG)  +----------+--------+--------+--------+-------------------+  Subclavian 154                                             +----------+--------+--------+--------+-------------------+ +---------+--------+---+--------+--+---------+  Vertebral PSV cm/s 151 EDV cm/s 29 Antegrade  +---------+--------+---+--------+--+---------+   Summary: Right Carotid: Velocities in the right ICA are consistent with a 40-59%                stenosis. Left Carotid: Velocities in the left ICA are consistent with a 1-39% stenosis. Vertebrals: Bilateral vertebral arteries demonstrate antegrade flow. *See table(s) above for measurements and observations.  Electronically signed by Deitra Mayo MD on 07/09/2021 at 4:00:38 PM.    Final    VAS [place] LOWER EXTREMITY VENOUS (DVT)  Result Date: 07/09/2021  Lower Venous DVT Study Patient Name:  ALIYHA FORNES  Date of Exam:  07/09/2021 Medical Rec #: [id number]      Accession #:    [id number] Date of Birth: February 26, 1933      Patient Gender: F Patient Age:   22 years Exam Location:  [facility] Procedure:      VAS [place] LOWER EXTREMITY VENOUS (DVT) Referring Phys: Cornelius Moras XU  --------------------------------------------------------------------------------  Indications: Stroke.  Risk Factors: None identified. Limitations: Poor ultrasound/tissue interface and patient positioning. Comparison Study: No prior studies. Performing Technologist: Oliver Hum RVT  Examination Guidelines: A complete evaluation includes B-mode imaging, spectral Doppler, color Doppler, and power Doppler as needed of all accessible portions of each vessel. Bilateral testing is considered an integral part of a complete examination. Limited examinations for reoccurring indications may be performed as noted. The reflux portion of the exam is performed with the patient in reverse Trendelenburg.  +---------+---------------+---------+-----------+----------+--------------+  RIGHT     Compressibility Phasicity Spontaneity Properties Thrombus Aging  +---------+---------------+---------+-----------+----------+--------------+  CFV       Full            Yes       Yes                                    +---------+---------------+---------+-----------+----------+--------------+  SFJ       Full                                                             +---------+---------------+---------+-----------+----------+--------------+  FV Prox   Full                                                             +---------+---------------+---------+-----------+----------+--------------+  FV Mid    Full                                                             +---------+---------------+---------+-----------+----------+--------------+  FV Distal Full                                                             +---------+---------------+---------+-----------+----------+--------------+  PFV       Full                                                             +---------+---------------+---------+-----------+----------+--------------+  POP       Full            Yes       Yes                                     +---------+---------------+---------+-----------+----------+--------------+  PTV       Full                                                             +---------+---------------+---------+-----------+----------+--------------+  PERO      Full                                                             +---------+---------------+---------+-----------+----------+--------------+   +---------+---------------+---------+-----------+----------+--------------+  LEFT      Compressibility Phasicity Spontaneity Properties Thrombus Aging  +---------+---------------+---------+-----------+----------+--------------+  CFV       Full            Yes       Yes                                    +---------+---------------+---------+-----------+----------+--------------+  SFJ       Full                                                             +---------+---------------+---------+-----------+----------+--------------+  FV Prox   Full                                                             +---------+---------------+---------+-----------+----------+--------------+  FV Mid    Full            Yes       Yes                                    +---------+---------------+---------+-----------+----------+--------------+  FV Distal Full                                                             +---------+---------------+---------+-----------+----------+--------------+  PFV       Full                                                             +---------+---------------+---------+-----------+----------+--------------+  POP       Full            Yes       Yes                                    +---------+---------------+---------+-----------+----------+--------------+  PTV       Full                                                             +---------+---------------+---------+-----------+----------+--------------+  PERO      Full                                                              +---------+---------------+---------+-----------+----------+--------------+     Summary: RIGHT: - There is no evidence of deep vein thrombosis in the lower extremity. However, portions of this examination were limited- see technologist comments above.  - No cystic structure found in the popliteal fossa.  LEFT: - There is no evidence of deep vein thrombosis in the lower extremity. However, portions of this examination were limited- see technologist comments above.  - No cystic structure found in the popliteal fossa.  *See table(s) above for measurements and observations. Electronically signed by Deitra Mayo MD on 07/09/2021 at 4:00:19 PM.    Final     Labs:  Basic Metabolic Panel: Recent Labs  Lab 07/21/21 1000 07/23/21 1622  NA 138 138  K 4.4 3.3*  CL 103 99  CO2 25 28  GLUCOSE 137* 189*  BUN 51* 24*  CREATININE 8.86* 4.95*  CALCIUM 8.8* 9.1  PHOS 4.6 2.5    CBC: Recent Labs  Lab 07/21/21 1000 07/23/21 1622  WBC 7.9 7.4  HGB 8.8* 10.5*  HCT 28.4* 32.1*  MCV 91.6 90.2  PLT 342 305    CBG: No results for input(s): GLUCAP in the last 168 hours.   Brief HPI:   Stacey Dorsey is a 87 y.o. female who presented to [facility] emergency department after PEA arrest at home on 07/04/2021. She sustained multiple rib fractures due to chest compressions. ROSC obtained after 15 minutes of CPR. CT chest reveal possible pneumonia but no PE. She was extubated the following day.  Two days later she experienced some mild altered mental status consistent with sundowning. Narcotic side oeffect s also suspected. On 07/08/2021, she developed expressive aphasia and confusion.   CT of the head showed encephalomalacia from old anterior MCA territory stroke. MRI of the brain was performed showing multiple infarcts suspected to be due to micro emboli during resuscitation.  MRA of the head revealed moderate to severe narrowing in the left supraclinoid internal carotid artery.  There was decreased perfusion in  the left distal MCA branches without focal MCA stenosis or occlusion.  Multifocal narrowing of the right vertebral artery, which appeared occluded just proximal to the vertebrobasilar junction. Neurology consulted. On Plavix and aspirin. Nephrology consulted for history of ESRD on chronic HD.  Hospital Course: Stacey Dorsey was admitted to rehab 07/16/2021 for inpatient therapies to consist of PT, ST and OT at least three hours five days a week. Past admission physiatrist, therapy team and rehab RN have worked together to provide customized collaborative inpatient rehab. Her vital signs remained stable and she was afebrile. She tolerated her hemodialysis treatments well. She had slight drift in hemoglobin but did not need transfusion. Aranesp dosed weekly. She was given Tramadol in addition  to Tylenol and lidocaine patches for rib pain. She had side effects of restlessness and anxiousness. Tramadol was discontinued and added to her allergy list to avoid use in the future. Side effects resolved. Aspercreme and kpad added for relief. Tylenol changed to PRN and Tylenol #3 added. Other chronic medical conditions remained stable.  Blood pressures were monitored on TID basis and remained in reasonable control with systolic pressure averaging 140s-150s.  Rehab course: During patient's stay in rehab, weekly team conferences were held to monitor patient's progress, set goals and discuss barriers to discharge. At admission, patient required min assist for bed mobility, ambulation; min to mod assist for ADLs.  She has had improvement in activity tolerance, balance, postural control as well as ability to compensate for deficits. She has had improvement in functional use RUE/LUE  and RLE/LLE as well as improvement in awareness and improvement in aphasia and language impairment. Performed ambulation on compliant surface with RW and CGA however demonstrated good safety with RW to step onto mat.    Disposition:  Home Discharge disposition: 01-Home or Self Care        Diet: renal, heart healthy  Special Instructions:  1) No driving, alcohol intake or tobacco intake.  2) Follow-up with EP cardiology for 30 day event monitor.  3) Follow-up bilateral carotid duplex in one year.  30-35 minutes were spent on discharge planning and discharge summary.  Discharge Instructions     Ambulatory referral to Physical Medicine Rehab   Complete by: As directed    Discharge patient   Complete by: As directed    Discharge disposition: 01-Home or Self Care   Discharge patient date: 07/26/2021      Allergies as of 07/26/2021       Reactions   Penicillins Rash   Prednisone Other (See Comments)   insomnia insomnia insomnia   Penicillins    Scallops [shellfish Allergy]    Threw up blood   Shellfish Allergy    Tramadol Anxiety        Medication List     STOP taking these medications    HYDROmorphone 2 MG tablet Commonly known as: DILAUDID   ibuprofen 200 MG tablet Commonly known as: ADVIL       TAKE these medications    acetaminophen 500 MG tablet Commonly known as: TYLENOL Take 2 tablets (1,000 mg total) by mouth 3 (three) times daily. For 3 to 5 days and then can consider changing it to as needed based on improvement of her musculoskeletal chest pain related to rib fractures.   acetaminophen-codeine 300-30 MG tablet Commonly known as: TYLENOL #3 Take 1 tablet by mouth every 4 (four) hours as needed for moderate pain.   Advair HFA 115-21 MCG/ACT inhaler Generic drug: fluticasone-salmeterol Inhale 1 puff into the lungs 2 (two) times daily.   albuterol 108 (90 Base) MCG/ACT inhaler Commonly known as: VENTOLIN HFA Inhale 2 puffs into the lungs every 4 (four) hours as needed for shortness of breath. What changed: Another medication with the same name was removed. Continue taking this medication, and follow the directions you see here.   allopurinol 100 MG tablet Commonly  known as: ZYLOPRIM Take 100 mg by mouth daily.   aspirin 325 MG EC tablet Take 1 tablet (325 mg total) by mouth daily.   carvedilol 25 MG tablet Commonly known as: COREG Take 1 tablet (25 mg total) by mouth See admin instructions. Take 25 mg twice daily on nondailysis days (Sun, Mon, Wed, and Fri)  cinacalcet 30 MG tablet Commonly known as: SENSIPAR Take 1 tablet (30 mg total) by mouth Every Tuesday,Thursday,and Saturday with dialysis. Start taking on: July 27, 2021   cloNIDine 0.1 MG tablet Commonly known as: CATAPRES Take 1 tablet (0.1 mg total) by mouth 2 (two) times daily.   clopidogrel 75 MG tablet Commonly known as: PLAVIX Take 1 tablet (75 mg total) by mouth daily.   Darbepoetin Alfa 60 MCG/0.3ML Sosy injection Commonly known as: ARANESP Inject 0.3 mLs (60 mcg total) into the skin every Thursday at 6pm. Start taking on: August 01, 2021   docusate sodium 100 MG capsule Commonly known as: COLACE Take 1 capsule (100 mg total) by mouth 2 (two) times daily.   doxercalciferol 4 MCG/2ML injection Commonly known as: HECTOROL Inject 2.5 mLs (5 mcg total) into the vein Every Tuesday,Thursday,and Saturday with dialysis. Start taking on: July 27, 2021   feeding supplement (NEPRO CARB STEADY) Liqd Take 237 mLs by mouth 2 (two) times daily between Dorsey.   ferric citrate 1 GM 210 MG(Fe) tablet Commonly known as: AURYXIA Take 1 tablet (210 mg total) by mouth 3 (three) times daily with Dorsey.   lidocaine 5 % Commonly known as: LIDODERM Place 2 patches onto the skin daily. Remove & Discard patch within 12 hours or as directed by MD   lidocaine-prilocaine cream Commonly known as: EMLA Apply 1 application topically 3 (three) times a week.   multivitamin Tabs tablet Take 1 tablet by mouth at bedtime.   Muscle Rub 10-15 % Crea Apply 1 application topically as needed for muscle pain.   NIFEdipine 30 MG 24 hr tablet Commonly known as:  PROCARDIA-XL/NIFEDICAL-XL Take 2 tablets (60 mg total) by mouth at bedtime.   omeprazole 20 MG capsule Commonly known as: PRILOSEC Take 2 capsules (40 mg total) by mouth daily.   polyethylene glycol 17 g packet Commonly known as: MIRALAX / GLYCOLAX Take 17 g by mouth daily.   rOPINIRole 0.5 MG tablet Commonly known as: REQUIP Take 0.5 mg by mouth at bedtime.   Senna 8.6 MG Caps Take 1 tablet by mouth daily.   simvastatin 20 MG tablet Commonly known as: ZOCOR Take 20 mg by mouth at bedtime.   SYSTANE OP Place 1 drop into both eyes daily as needed (dry eyes).   traZODone 50 MG tablet Commonly known as: DESYREL Take 1 tablet (50 mg total) by mouth at bedtime as needed for sleep.        Follow-up Information     [facility]. Call in 4 week(s).   Why: Call on Monday for four week follow-up appointment post hospitalization Contact information: [address]     [address]  [place] [address] [phone]        Courtney Heys, MD Follow up.   Specialty: Physical Medicine and Rehabilitation Why: office will call you to arrange your appt (sent) Contact information: [address] [place] [address] [phone]                 Signed: Barbie Banner 07/26/2021, 9:06 AM

## 2021-07-23 NOTE — Progress Notes (Addendum)
Patient ID: Stacey Dorsey, female   DOB: 01-22-1933, 87 y.o.   MRN: [id number]  SW went by to see pt to provide updates from team conference, and d/c date 12/30 but pt gone to dialysis. SW will continue to make efforts to update.   SW updated Greg/Dialysis Coord [phone]) to inform on pt d/c date. Marya Amsler is covering for [employer] [phone]).  SW left message for pt dtr [name] to discuss above. SW requested return phone call to provide further d/c recommendations.  Loralee Pacas, MSW, [place] Office: [phone] Cell: [phone] Fax: [phone]

## 2021-07-23 NOTE — Progress Notes (Signed)
PROGRESS NOTE   Subjective/Complaints:  Pt reports slept a LOT better- a whole lot better- feels great comparatively.   LBM yesterday- stomach hurts a little- but not bad.  Passing gas.  Having suppository QOD like scheduled.  Going to HD today- said she didn't do yesterday since was told by renal didn't need it til today.    ROS:   Pt denies SOB, abd pain, CP, N/V/C/D, and vision changes   Objective:   No results found. Recent Labs    07/21/21 1000  WBC 7.9  HGB 8.8*  HCT 28.4*  PLT 342    Recent Labs    07/21/21 1000  NA 138  K 4.4  CL 103  CO2 25  GLUCOSE 137*  BUN 51*  CREATININE 8.86*  CALCIUM 8.8*    Intake/Output Summary (Last 24 hours) at 07/23/2021 1008 Last data filed at 07/23/2021 0657 Gross per 24 hour  Intake 417 ml  Output --  Net 417 ml        Physical Exam: Vital Signs Blood pressure (!) 155/59, pulse 63, temperature 97.8 F (36.6 C), temperature source Oral, resp. rate 16, height 5\' 9"  (1.753 m), weight 63 kg, SpO2 98 %.       General: awake, alert, appropriate, laying on R side in bed;  NAD HENT: conjugate gaze; oropharynx moist CV: regular rate; no JVD Pulmonary: CTA B/L; no W/R/R- good air movement GI: soft, NT, ND, (+)BS Psychiatric: appropriate- bright affect Neurological: fair to poor memory Ext: no clubbing, cyanosis, or edema Psych: pleasant but anxious Musculoskeletal:  Remains TTP over multiple rib fractures- anterior ribs, sternum- no change RUE 5-/5; LUE 5/5 RLE_ 5-/5; LLE 5/5  Skin:    General: Skin is warm and dry.     Comments: Heels slightly boggy B/L  IV R upper arms looks good [place]ill LUE from fistula  Neurological:     Mental Status: She is alert and oriented to person, place, and time.     Comments: Alert and oriented x 3. fairl insight and awareness. ?fair Memory. Normal language and speech. Cranial nerve exam unremarkable  Intact to light  touch in all 4 extremities       Assessment/Plan: 1. Functional deficits which require 3+ hours per day of interdisciplinary therapy in a comprehensive inpatient rehab setting. Physiatrist is providing close team supervision and 24 hour management of active medical problems listed below. Physiatrist and rehab team continue to assess barriers to discharge/monitor patient progress toward functional and medical goals  Care Tool:  Bathing    Body parts bathed by patient: Right arm, Left arm, Chest, Abdomen, Front perineal area, Buttocks, Right upper leg, Left upper leg, Right lower leg, Left lower leg, Face   Body parts bathed by helper: Right lower leg, Left lower leg     Bathing assist Assist Level: Contact Guard/Touching assist     Upper Body Dressing/Undressing Upper body dressing   What is the patient wearing?: Pull over shirt    Upper body assist Assist Level: Set up assist    Lower Body Dressing/Undressing Lower body dressing      What is the patient wearing?: Underwear/pull up, Pants  Lower body assist Assist for lower body dressing: Contact Guard/Touching assist     Toileting Toileting    Toileting assist Assist for toileting: Supervision/Verbal cueing     Transfers Chair/bed transfer  Transfers assist     Chair/bed transfer assist level: Contact Guard/Touching assist     Locomotion Ambulation   Ambulation assist      Assist level: Contact Guard/Touching assist Assistive device: Walker-rolling Max distance: 150'   Walk 10 feet activity   Assist     Assist level: Contact Guard/Touching assist Assistive device: Walker-rolling   Walk 50 feet activity   Assist    Assist level: Contact Guard/Touching assist Assistive device: Walker-rolling    Walk 150 feet activity   Assist    Assist level: Contact Guard/Touching assist Assistive device: Walker-rolling    Walk 10 feet on uneven surface  activity   Assist Walk 10 feet  on uneven surfaces activity did not occur: Safety/medical concerns         Wheelchair     Assist Is the patient using a wheelchair?: No             Wheelchair 50 feet with 2 turns activity    Assist            Wheelchair 150 feet activity     Assist          Blood pressure (!) 155/59, pulse 63, temperature 97.8 F (36.6 C), temperature source Oral, resp. rate 16, height 5\' 9"  (1.753 m), weight 63 kg, SpO2 98 %.    Medical Problem List and Plan: 1. Functional deficits secondary to anterior left MCA stroke/encephalomalacia further defined by MRI as multiple microembolic infarcts in that distribution; metabolic encephalopathy             -patient may  shower             -ELOS/Goals: 6-9 days supervision to mod I  Continue CIR- PT, OT and SLP-team conference today to determine length of stay 2.  Antithrombotics: -DVT/anticoagulation:  Pharmaceutical: Heparin             -antiplatelet therapy: DAPT>>Plavix, aspirin 235 mg daily for 3 months then aspirin alone 3. Pain Management: Tylenol,- has rib pain- might need lidoderm patches?  12/21- will order lidoderm patches 2- 8pm-8am and see if needs something stronger?  12/22- Tramadol was tried- will stop since made her anxious/restless- will start Tylenol PRN (get rid of scheduled) and tylenol #3 prn  12/24- discussed pain from rib fx's. She's hurting more today because she moved quite a bit yesterday   -will try aspercreme in addition to lidoderm, tylenol   -ordered kpad also  12/26- Hasn't taken Tylenol #3 yet- will ask nursing to encourage usage before therapy.   12/27- will con't to encourage tylenol #3 4. Mood: LCSW to assess and provide support             -antipsychotic agents: n/a 5. Neuropsych: This patient is capable of making decisions on her own behalf. Consider neuropsych referral  6. Skin/Wound Care: routine skin checks 7. Fluids/Electrolytes/Nutrition: Is and Os and follow-up chemistries.   --Chronic iHD. T/T/S --Tolerating diet. 8. ESRD: TTS schedule after therapies to allow full participation --[facility] clinic as outpatient 9. Acute on chronic hypoxic respiratory failure/pneumonia: completed ceftriaxone on 12/15.  --Currently normal SaO2 on RA 10: Anemia: [place] and improving -- Aranesp per nephrology 11: Hypertension: Systolic BP averaging 196-222L. Continue coreg, nifedipine, clonidine  12/27- SBP stable in 150s/160s- con't  regimen- don't want to drop due to HD. Marland Kitchen  12: Gout: no flare, continue allopurinol 13: GERD: continue Protonix 14: DM2: Hgb A1c = 5.7%.  --Continue CBGs checks, SSI 12/27- CBGs 106-187- con't to monitor 15: Grade 1 diastolic dysfunction: monitor  16: Bilateral carotid stenosis: will need annual carotid duplex follow-up 17: Hyperlipidemia: continue simvastatin 18: Rib fractures: aggressive pulmonary hygiene, encourage deep cough; IS.  --Consider outpatient PFTs/pulmonary follow-up 12/24 see above 19. Restless leg syndrome: continue Requip 20 Chronic constipation- will schedule Suppository every other day after dinner.    12/24 had bm 12/23   21. Anxiety- new. Often pain related  12/22- likely tramadol- will stop and give vistaril 10 mg q8 hours prn.   12/23- resolved off tramadol.  22. Insomnia  12/26- will con't Melatonin 3 mg QHS prn and add Trazodone 50 mg QHS.   12/27- slept great- con't regimen  LOS: 7 days A FACE TO FACE EVALUATION WAS PERFORMED  Stacey Dorsey 07/23/2021, 10:08 AM

## 2021-07-24 MED ORDER — DOXERCALCIFEROL 4 MCG/2ML IV SOLN: 5.0000 ug | INTRAVENOUS | Status: DC

## 2021-07-24 MED ORDER — DARBEPOETIN ALFA 60 MCG/0.3ML IJ SOSY
60.0000 ug | PREFILLED_SYRINGE | INTRAMUSCULAR | Status: DC
  Filled 2021-07-24: qty 0.3

## 2021-07-24 MED ORDER — DARBEPOETIN ALFA 60 MCG/0.3ML IJ SOSY: 60.0000 ug | PREFILLED_SYRINGE | INTRAMUSCULAR | Status: DC

## 2021-07-24 MED ORDER — CINACALCET HCL 30 MG PO TABS
30.0000 mg | ORAL_TABLET | ORAL | Status: DC
  Administered 2021-07-25: 12:00:00 30 mg via ORAL
  Filled 2021-07-24: qty 1

## 2021-07-24 NOTE — Progress Notes (Signed)
PROGRESS NOTE   Subjective/Complaints:  Slept well again. Denies pain. Excited to be going home soon!  ROS: Patient denies fever, rash, sore throat, blurred vision, nausea, vomiting, diarrhea, cough, shortness of breath or chest pain, joint or back pain, headache, or mood change.        Objective:   No results found. Recent Labs    07/21/21 1000 07/23/21 1622  WBC 7.9 7.4  HGB 8.8* 10.5*  HCT 28.4* 32.1*  PLT 342 305    Recent Labs    07/21/21 1000 07/23/21 1622  NA 138 138  K 4.4 3.3*  CL 103 99  CO2 25 28  GLUCOSE 137* 189*  BUN 51* 24*  CREATININE 8.86* 4.95*  CALCIUM 8.8* 9.1    Intake/Output Summary (Last 24 hours) at 07/24/2021 0839 Last data filed at 07/23/2021 1535 Gross per 24 hour  Intake 0 ml  Output 2000 ml  Net -2000 ml        Physical Exam: Vital Signs Blood pressure (!) 105/95, pulse 65, temperature 99 F (37.2 C), temperature source Oral, resp. rate 17, height 5\' 9"  (1.753 m), weight 60.7 kg, SpO2 100 %.       Constitutional: No distress . Vital signs reviewed. HEENT: NCAT, EOMI, oral membranes moist Neck: supple Cardiovascular: RRR without murmur. No JVD    Respiratory/Chest: CTA Bilaterally without wheezes or rales. Normal effort    GI/Abdomen: BS +, non-tender, non-distended Ext: no clubbing, cyanosis, or edema Psych: pleasant and cooperative  Musculoskeletal:  Remains TTP over multiple rib fractures- anterior ribs, sternum- no change RUE 5-/5; LUE 5/5 RLE_ 5-/5; LLE 5/5  Skin:    General: Skin is warm and dry.     Comments: Heels slightly boggy B/L  +Thrill LUE fistula Neurological:       Comments: Alert and oriented x 3. fairl insight and awareness. ?fair Memory. Normal language and speech. Cranial nerve exam unremarkable  Intact to light touch in all 4 extremities       Assessment/Plan: 1. Functional deficits which require 3+ hours per day of  interdisciplinary therapy in a comprehensive inpatient rehab setting. Physiatrist is providing close team supervision and 24 hour management of active medical problems listed below. Physiatrist and rehab team continue to assess barriers to discharge/monitor patient progress toward functional and medical goals  Care Tool:  Bathing    Body parts bathed by patient: Right arm, Left arm, Chest, Abdomen, Front perineal area, Buttocks, Right upper leg, Left upper leg, Right lower leg, Left lower leg, Face   Body parts bathed by helper: Right lower leg, Left lower leg     Bathing assist Assist Level: Contact Guard/Touching assist     Upper Body Dressing/Undressing Upper body dressing   What is the patient wearing?: Pull over shirt    Upper body assist Assist Level: Set up assist    Lower Body Dressing/Undressing Lower body dressing      What is the patient wearing?: Underwear/pull up, Pants     Lower body assist Assist for lower body dressing: Contact Guard/Touching assist     Toileting Toileting    Toileting assist Assist for toileting: Supervision/Verbal cueing  Transfers Chair/bed transfer  Transfers assist     Chair/bed transfer assist level: Supervision/Verbal cueing     Locomotion Ambulation   Ambulation assist      Assist level: Supervision/Verbal cueing Assistive device: Walker-rolling Max distance: 150'   Walk 10 feet activity   Assist     Assist level: Supervision/Verbal cueing Assistive device: Walker-rolling   Walk 50 feet activity   Assist    Assist level: Supervision/Verbal cueing Assistive device: Walker-rolling    Walk 150 feet activity   Assist    Assist level: Supervision/Verbal cueing Assistive device: Walker-rolling    Walk 10 feet on uneven surface  activity   Assist Walk 10 feet on uneven surfaces activity did not occur: Safety/medical concerns         Wheelchair     Assist Is the patient using a  wheelchair?: No             Wheelchair 50 feet with 2 turns activity    Assist            Wheelchair 150 feet activity     Assist          Blood pressure (!) 105/95, pulse 65, temperature 99 F (37.2 C), temperature source Oral, resp. rate 17, height 5\' 9"  (1.753 m), weight 60.7 kg, SpO2 100 %.    Medical Problem List and Plan: 1. Functional deficits secondary to anterior left MCA stroke/encephalomalacia further defined by MRI as multiple microembolic infarcts in that distribution; metabolic encephalopathy             -patient may  shower             -ELOS/Goals: 6-9 days supervision to mod I  -Continue CIR therapies including PT, OT, and SLP  2.  Antithrombotics: -DVT/anticoagulation:  Pharmaceutical: Heparin             -antiplatelet therapy: DAPT>>Plavix, aspirin 235 mg daily for 3 months then aspirin alone 3. Pain Management: Tylenol,- has rib pain- might need lidoderm patches?  12/21- will order lidoderm patches 2- 8pm-8am and see if needs something stronger?  12/22- Tramadol was tried- will stop since made her anxious/restless- will start Tylenol PRN (get rid of scheduled) and tylenol #3 prn  12/24- discussed pain from rib fx's. She's hurting more today because she moved quite a bit yesterday   -will try aspercreme in addition to lidoderm, tylenol   -ordered kpad also  12/26- Hasn't taken Tylenol #3 yet- will ask nursing to encourage usage before therapy.   12/28 primarly using tylenol for pain. Pain controlled today per pt 4. Mood: LCSW to assess and provide support             -antipsychotic agents: n/a 5. Neuropsych: This patient is capable of making decisions on her own behalf. Consider neuropsych referral  6. Skin/Wound Care: routine skin checks 7. Fluids/Electrolytes/Nutrition: Is and Os and follow-up chemistries.  --Chronic iHD. T/T/S --Tolerating diet. 8. ESRD: TTS schedule after therapies to allow full participation --[facility] clinic as  outpatient 9. Acute on chronic hypoxic respiratory failure/pneumonia: completed ceftriaxone on 12/15.  --Currently normal SaO2 on RA 10: Anemia: [place] and improving -- Aranesp per nephrology 11: Hypertension: Systolic BP averaging 350-093G. Continue coreg, nifedipine, clonidine  12/27- SBP stable in 150s/160s- con't regimen- don't want to drop due to HD. Marland Kitchen  12: Gout: no flare, continue allopurinol 13: GERD: continue Protonix 14: DM2: Hgb A1c = 5.7%.  --Continue CBGs checks, SSI 12/27- CBGs 106-187- con't to  monitor 15: Grade 1 diastolic dysfunction: monitor  16: Bilateral carotid stenosis: will need annual carotid duplex follow-up 17: Hyperlipidemia: continue simvastatin 18: Rib fractures: aggressive pulmonary hygiene, encourage deep cough; IS.  --Consider outpatient PFTs/pulmonary follow-up 12/24 see above 19. Restless leg syndrome: continue Requip 20 Chronic constipation- will schedule Suppository every other day after dinner.    12/24 had bm 12/23   21. Anxiety- new. Often pain related  12/22- likely tramadol- will stop and give vistaril 10 mg q8 hours prn.   12/23- resolved off tramadol.  22. Insomnia  12/26- will con't Melatonin 3 mg QHS prn and add Trazodone 50 mg QHS.   12/27-28:  slept great- con't regimen  LOS: 8 days A FACE TO FACE EVALUATION WAS PERFORMED  Meredith Staggers 07/24/2021, 8:39 AM

## 2021-07-24 NOTE — Progress Notes (Signed)
Occupational Therapy Session Note  Patient Details  Name: Stacey Dorsey MRN: [id number] Date of Birth: Aug 08, 1932  Today's Date: 07/24/2021 OT Individual Time: 4098-1191 OT Individual Time Calculation (min): 68 min    Short Term Goals: Week 1:  OT Short Term Goal 1 (Week 1): STGs = LTGs 2/2 ELOS at Supervision  Skilled Therapeutic Interventions/Progress Updates:    Pt sleeping upon arrival but easily aroused. Pt selected clothing and amb with RW into bathroom. Pt completed toileting, bathing, and dressing in bathroom with sit<>stand from toilet. CGA for standing balance. Amb with RW in room with CGA. Pt completed grooming tasks seated in w/c at sink. Pt amb with RW to ADL apartment and tub room. Pt practiced stepping over into tub with CGA using grab bars. No safety concerns. Pt excited about discharge date of 12/30. Pt returned to room and requested to return to bed to rest. Pt remained in bed with all needs within reach and bed alarm activated.  Therapy Documentation Precautions:  Precautions Precautions: Fall Precaution Comments: rib fxs (R4, 6-8 and L 6-9th), expressive aphasia Restrictions Weight Bearing Restrictions: No Pain: Pain Assessment Pain Scale: 0-10 Pain Score: 0-No pain   Therapy/Group: Individual Therapy  Leroy Libman 07/24/2021, 9:26 AM

## 2021-07-24 NOTE — Progress Notes (Signed)
Speech Language Pathology Daily Session Note  Patient Details  Name: Stacey Dorsey MRN: [id number] Date of Birth: 03-29-1933  Today's Date: 07/24/2021 SLP Individual Time: 0932-1000 SLP Individual Time Calculation (min): 28 min  Short Term Goals: Week 1: SLP Short Term Goal 1 (Week 1): Patient will be able to name at least 10 items in a given category with minA verbal cues. SLP Short Term Goal 2 (Week 1): Patient will be able to summarize after reading or listening to short paragraph length story, with minA verbal cues. SLP Short Term Goal 3 (Week 1): Patient will answer complex yes/no questions with 90% accuracy and minA verbal cues. SLP Short Term Goal 4 (Week 1): Patient will participate in assessment of complex level cognitive abilities. SLP Short Term Goal 5 (Week 1): Patient will be able to recall and utilize learned strategies in PT/OT and ST sessions with minA verbal, visual cues. SLP Short Term Goal 6 (Week 1): Patient will be able to name less common object pictures/photos with at least 80% accuracy and minA verbal cues.  Skilled Therapeutic Interventions: Pt seen for skilled ST with focus on cognitive goals, pt in bed and motivated for therapeutic tasks. Pt reports being at baseline for cognition, demonstrates functional recall with Supervision A at this time. Pt independent to recall strategies from ST session 12/24 for semantic feature analysis and compensatory strategies to recall names of grandchildren. Pt is very proud of herself! Pt is supervision-mod I for functional problem solving tasks this date, states she has pictures of her room at home to plan for safe discharge (equipment, measurements, etc). Pt left in bed with alarm set and all needs within reach. Cont ST POC.   Pain Pain Assessment Pain Scale: 0-10 Pain Score: 0-No pain  Therapy/Group: Individual Therapy  Dewaine Conger 07/24/2021, 11:56 AM

## 2021-07-24 NOTE — Progress Notes (Signed)
Physical Therapy Session Note  Patient Details  Name: Stacey Dorsey MRN: [id number] Date of Birth: 01-09-1933  Today's Date: 07/24/2021 PT Individual Time: 5051-0712 PT Individual Time Calculation (min): 40 min   Short Term Goals: Week 1:  PT Short Term Goal 1 (Week 1): =LTG due to ELOS  Skilled Therapeutic Interventions/Progress Updates: Pt presented in bed agreeable to therapy. Pt initially confused as thought that she was done with therapies for the day then thought that this PTA was SLP. Reoriented pt and reviewed SLP's note for today with pt then remembering session and stating she didn't realize that was an ST session. Pt performed bed mobility with supervision with PTA donning shoes for time management. Pt performed stand pivot to w/c without AD and CGA. Pt transported to rehab gym and participated in step ups without rails to 6 in step x 5 for block practice of home entry. Pt then ascended/descended 4 steps x 4 with B rails for endurance. Pt also participaed in ambulation without AD ~148f with CGA and decreased R arm swing noted. Pt was able to improve with verbal cues. Pt also ambulated weaving through cones without AD then walking next to cones and tapping on ipsilateral side. Performed ambulation on compliant surface with RW and CGA however demonstrating good safety with RW to step onto mat. Pt did not demonstrate any LOB with activities. Pt then ambulated back to room without AD >2013fand CGA. Pt did have improved arm swing during this bout. Pt then requesting to attempt bathroom once back in room. Ambulated to toilet and performed toilet transfers with CGA. PTA left room to obtain w/c and RW to return to room and pt requesting to return to bed (per pt only gas). Performed ambulatory transfer to bed with CGA without AD. Pt returned to supine with supervision and left with bed alarm on, call bell within reach and needs met.      Therapy Documentation Precautions:   Precautions Precautions: Fall Precaution Comments: rib fxs (R4, 6-8 and L 6-9th), expressive aphasia Restrictions Weight Bearing Restrictions: No General:   Vital Signs:  Pain: Pain Assessment Pain Scale: 0-10 Pain Score: 0-No pain Mobility:   Locomotion :    Trunk/Postural Assessment :    Balance:   Exercises:   Other Treatments:      Therapy/Group: Individual Therapy  Natelie Ostrosky 07/24/2021, 3:53 PM

## 2021-07-24 NOTE — Progress Notes (Shared)
Speech Language Pathology Discharge Summary  Patient Details  Name: Stacey Dorsey MRN: [id number] Date of Birth: 06-08-1933  Skilled Therapeutic Interventions:  ***    Patient has met 3 of 3 long term goals.  Patient to discharge at overall Modified Independent level.   Clinical Impression/Discharge Summary:   Pt has made functional gains this reporting period meeting 3 out of 3 long term goals.   Care Partner:  Caregiver Able to Provide Assistance: Yes  Type of Caregiver Assistance: Physical;Cognitive  Recommendation:  None ; intermittent supervision, assist with complex cognitive tasks (med manage, money manage, etc).      Reasons for discharge: Treatment goals met;Discharged from [facility] 07/24/2021, 12:01 PM

## 2021-07-24 NOTE — Progress Notes (Signed)
Physical Therapy Session Note  Patient Details  Name: Stacey Dorsey MRN: [id number] Date of Birth: July 06, 1933  Today's Date: 07/24/2021 PT Individual Time: 1100-1200 PT Individual Time Calculation (min): 60 min   Short Term Goals: Week 1:  PT Short Term Goal 1 (Week 1): =LTG due to ELOS  Skilled Therapeutic Interventions/Progress Updates:    Pt received seated in bed, agreeable to PT session. Pt reports feeling very fatigued this AM but agreeable with encouragement to participate in therapy session. No complaints of pain at rest, does have onset of rib pain with exertion that improves at rest. Bed mobility with Supervision throughout session. Sit to stand with and without RW at Supervision level during session. Ambulation up to 200 ft with RW at Supervision level, up to 200 ft with no AD and CGA for balance. Pt exhibits some path deviation during gait both with and without RW. Ambulation through obstacle course weaving through cones and up/down 6" step with RW and Supervision (CGA for step) and without RW with CGA (min A for step). Pt remains fearful when ascending/descending 6" step but exhibits good balance when navigating. Static standing balance with no UE support performing ball toss against rebounder, 3 x 30 reps. Sit to stand 2 x 10 reps while holding ball with CGA for LE strengthening. Pt returned to bed at end of session, Supervision for bed mobility. Pt left seated in bed with needs in reach, bed alarm in place.  Therapy Documentation Precautions:  Precautions Precautions: Fall Precaution Comments: rib fxs (R4, 6-8 and L 6-9th), expressive aphasia Restrictions Weight Bearing Restrictions: No      Therapy/Group: Individual Therapy   Excell Seltzer, PT, DPT, CSRS  07/24/2021, 12:22 PM

## 2021-07-24 NOTE — Progress Notes (Signed)
Physical Therapy Discharge Summary  Patient Details  Name: Danni Storr MRN: [id number] Date of Birth: [birth year]  Today's Date: 07/25/2021       Patient has met 8 of 8 long term goals due to improved activity tolerance, improved balance, improved postural control, and ability to compensate for deficits.  Patient to discharge at an ambulatory level with RW Supervision with CGA  needed for step/stair navigation without handrails similar to pt's home environment.   Patient's care partner is independent to provide the necessary physical assistance at discharge.  Reasons goals not met: N/A all goals met   Recommendation:  Patient will benefit from ongoing skilled PT services in home health setting to continue to advance safe functional mobility, address ongoing impairments in endurance, strength, balance, safety, independence with functional mobility, and minimize fall risk.  Equipment: RW  Reasons for discharge: treatment goals met and discharge from hospital  Patient/family agrees with progress made and goals achieved: Yes  PT Discharge Precautions/Restrictions Precautions Precautions: Fall Precaution Comments: rib fxs (R4, 6-8 and L 6-9th), expressive aphasia Vital Signs  Pain Pain Assessment Pain Score: 4  Pain Interference Pain Interference Pain Effect on Sleep: 1. Rarely or not at all Pain Interference with Therapy Activities: 1. Rarely or not at all Pain Interference with Day-to-Day Activities: 2. Occasionally Vision/Perception  Vision - History Ability to See in Adequate Light: 0 Adequate Perception Perception: Within Functional Limits Praxis Praxis: Intact  Cognition Overall Cognitive Status: Within Functional Limits for tasks assessed Arousal/Alertness: Awake/alert Orientation Level: Oriented X4 Year: 2022 Month: December Day of Week: Incorrect Attention: Selective Focused Attention: Appears intact Sustained Attention: Appears intact Selective Attention:  Impaired Memory: Impaired Memory Impairment: Decreased recall of new information Immediate Memory Recall: Sock;Blue;Bed Memory Recall Sock: Without Cue Memory Recall Blue: Without Cue Memory Recall Bed: With Cue Awareness: Appears intact Problem Solving: Appears intact Problem Solving Impairment: Functional basic;Functional complex Safety/Judgment: Appears intact Sensation Sensation Light Touch: Impaired by gross assessment Hot/Cold: Appears Intact Proprioception: Appears Intact Coordination Gross Motor Movements are Fluid and Coordinated: Yes Fine Motor Movements are Fluid and Coordinated: Yes Motor  Motor Motor: Abnormal postural alignment and control Motor - Skilled Clinical Observations: limited by weakness and rib pain Motor - Discharge Observations: limited by rib pain  Mobility Bed Mobility Bed Mobility: Rolling Right;Right Sidelying to Sit;Supine to Sit Rolling Right: Independent with assistive device Rolling Left: Independent with assistive device Supine to Sit: Independent Sit to Supine: Independent Transfers Transfers: Sit to Stand;Stand to Sit;Stand Pivot Transfers Sit to Stand: Supervision/Verbal cueing Stand to Sit: Supervision/Verbal cueing Stand Pivot Transfers: Supervision/Verbal cueing Transfer (Assistive device): Rolling walker Locomotion  Gait Ambulation: Yes Gait Assistance: Supervision/Verbal cueing Gait Distance (Feet): 200 Feet Assistive device: Rolling walker Gait Gait: Yes Gait Pattern: Within Functional Limits Stairs / Additional Locomotion Stairs: Yes Stairs Assistance: Supervision/Verbal cueing Stair Management Technique: Two rails Number of Stairs: 12 Height of Stairs: 6 Ramp: Supervision/Verbal cueing Curb: Supervision/Verbal cueing Pick up small object from the floor assist level: Supervision/Verbal cueing Wheelchair Mobility Wheelchair Mobility: No  Trunk/Postural Assessment  Cervical Assessment Cervical Assessment: Within  Functional Limits Thoracic Assessment Thoracic Assessment: Exceptions to [facility] (rounded shoulders) Lumbar Assessment Lumbar Assessment: Within Functional Limits Postural Control Postural Control: Deficits on evaluation  Balance Balance Balance Assessed: Yes Static Sitting Balance Static Sitting - Balance Support: No upper extremity supported;Feet supported Static Sitting - Level of Assistance: 6: Modified independent (Device/Increase time) Dynamic Sitting Balance Dynamic Sitting - Balance Support: During functional activity;Feet supported Dynamic Sitting -  Level of Assistance: 5: Stand by assistance Dynamic Sitting - Balance Activities: Tourist information centre manager Standing - Balance Support: During functional activity Static Standing - Level of Assistance: 5: Stand by assistance Dynamic Standing Balance Dynamic Standing - Balance Support: No upper extremity supported;During functional activity Dynamic Standing - Level of Assistance: 5: Stand by assistance Dynamic Standing - Balance Activities: Ball toss Extremity Assessment  RUE Assessment RUE Assessment: Within Functional Limits General Strength Comments: WFL for ADL tasks LUE Assessment LUE Assessment: Within Functional Limits General Strength Comments: WFL for ADL tasks RLE Assessment RLE Assessment: Within Functional Limits LLE Assessment LLE Assessment: Within Functional Limits General Strength Comments: 5/5 grossly    Rosita DeChalus 07/25/2021, 12:25 PM

## 2021-07-24 NOTE — Progress Notes (Signed)
Patient ID: Stacey Dorsey, female   DOB: January 14, 1933, 87 y.o.   MRN: [id number] S: no c/o, seen in room  O:BP (!) 105/95 (BP Location: Right Arm)    Pulse 65    Temp 99 F (37.2 C) (Oral)    Resp 17    Ht 5\' 9"  (1.753 m)    Wt 60.7 kg    SpO2 100%    BMI 19.76 kg/m   Intake/Output Summary (Last 24 hours) at 07/24/2021 1210 Last data filed at 07/23/2021 1535 Gross per 24 hour  Intake 0 ml  Output 2000 ml  Net -2000 ml    Intake/Output: I/O last 3 completed shifts: In: 120 [P.O.:120] Out: 2000 [Other:2000]  Intake/Output this shift:  No intake/output data recorded. Weight change: -3 kg Gen:NAD CVS: RRR Resp:CTA Abd: +BS, soft, NT/ND Ext: no edema, LUE AVG +T/B   OP HD: AF TTS  3.5h   64.5kg  2/2 bath  400/500   LUA AVG  Hep none   - sensipar 30 ug tiw po  - hectorol 5 ug tiw IV  - mircera 75 ug q 2wks , last 12/01   - venofer 50 mg q. Weekly        Assessment/Plan: Acute CVA - L MCA by CT and MRI, +aphasia, on DAPT. Microembolic infarctions. EP consulted for loop recorder.  Now in CIR. PEA arrest - Out of hospital, per primary.   ESRD - TTS HD. Pt refusing HD today (rollover from the weekend). Plan HD tomorrow to get back on schedule.  Rib fractures/ chest pain - due to CPR and still complaining of intermittent chest pain. Pneumonia- improved, completed course of Rocephin/ azithro IV here HTN/vol - BP's high. Coreg/ procardia as at home, clonidine added. 3-4 kg under dry. Increased procardia to 60 qd. Uf'ing as tolerated Anemia-  Hb 9- 10. Will reorder Aranesp 60ug weekly SQ.  CKD-BMD- Ca ok, resumed auryxia-phos better now AMS - with tramadol.  Avoid in the future.   Kelly Splinter, MD 07/24/2021, 12:10 PM      Recent Labs  Lab 07/18/21 1400 07/21/21 1000 07/23/21 1622  NA 137 138 138  K 4.2 4.4 3.3*  CL 102 103 99  CO2 24 25 28   GLUCOSE 117* 137* 189*  BUN 53* 51* 24*  CREATININE 7.10* 8.86* 4.95*  ALBUMIN 3.0* 2.7* 3.1*  CALCIUM 8.6* 8.8* 9.1  PHOS 4.5  4.6 2.5    Liver Function Tests: Recent Labs  Lab 07/18/21 1400 07/21/21 1000 07/23/21 1622  ALBUMIN 3.0* 2.7* 3.1*    No results for input(s): LIPASE, AMYLASE in the last 168 hours. No results for input(s): AMMONIA in the last 168 hours. CBC: Recent Labs  Lab 07/21/21 1000 07/23/21 1622  WBC 7.9 7.4  HGB 8.8* 10.5*  HCT 28.4* 32.1*  MCV 91.6 90.2  PLT 342 305     Studies/Results: No results found.  allopurinol  100 mg Oral Daily   aspirin  325 mg Oral Daily   bisacodyl  10 mg Rectal QODAY   carvedilol  25 mg Oral BID WC   cloNIDine  0.1 mg Oral BID   clopidogrel  75 mg Oral Daily   [START ON 07/25/2021] darbepoetin (ARANESP) injection - NON-DIALYSIS  60 mcg Subcutaneous Q Thu-1800   feeding supplement (NEPRO CARB STEADY)  237 mL Oral BID   ferric citrate  210 mg Oral TID WC   heparin  5,000 Units Subcutaneous Q8H   lidocaine  2 patch Transdermal Q24H  lidocaine-prilocaine  1 application Topical Once per day on Mon Wed Fri   mometasone-formoterol  2 puff Inhalation BID   multivitamin  1 tablet Oral QHS   NIFEdipine  60 mg Oral QHS   pantoprazole  40 mg Oral Daily   rOPINIRole  0.5 mg Oral QHS   senna-docusate  2 tablet Oral BID   simvastatin  20 mg Oral QHS   traZODone  50 mg Oral QHS

## 2021-07-24 NOTE — Progress Notes (Addendum)
Patient ID: Stacey Dorsey, female   DOB: [birth year], 87 y.o.   MRN: [id number]  SW spoke with pt dtr [name] [phone]) to provide updates from team conference, and discuss d/c date. Reports she has spoken to her mother and was aware of d/c date. SW discussed family education. Fam edu scheduled for tomorrow (12/29) 9am-12pm. SW to send email link for Medicare.gov for HHA preference review. SW emailed pt dtr [name] ([email or web address]) and granddaughter Safarah ([email or web address]). Preferred HHA is [facility].   SW spoke with Intake at [facility] (p: [phone]/f:[phone]) to discuss potential referral. Reports they do not have SLP. SW will follow-up if pt does not need.   *SW faxed referral and left message with intake dept to see if pt is able to be accepted.   SW spoke with Kendra/Intake with [facility] declining referral since not quite in catchment area.   SW sent referral to [place] and waiting on follow-up. *referral accepted for HHPT/OT.  Maceo, MSW, [place] Office: [phone] Cell: [phone] Fax: [phone]

## 2021-07-25 NOTE — Progress Notes (Signed)
Occupational Therapy Session Note  Patient Details  Name: Chrishana Treml MRN: [id number] Date of Birth: 10-Aug-1932  Today's Date: 07/25/2021 OT Individual Time: 0479-9872 OT Individual Time Calculation (min): 75 min    Short Term Goals: Week 1:  OT Short Term Goal 1 (Week 1): STGs = LTGs 2/2 ELOS at Supervision  Skilled Therapeutic Interventions/Progress Updates:    OT intervention with focus on BADL retraining, including toileting and bathing/dressing. Pt completed all functional amb with RW and BADLs at supervision level. Pt amb with RW to ortho gym for BITS activities with no LOB at supervision level. Pt's daughter joined session and observed pt stepping into tub to simulate home envirionment. Pt returned to room. Reviewed recommendation for supervision upon discharge. Pt and daughter verbalized understanding. Pt remained seated EOB with bed alarm activated, awaiting SLP. Pt's daughter present.   Therapy Documentation Precautions:  Precautions Precautions: Fall Precaution Comments: rib fxs (R4, 6-8 and L 6-9th), expressive aphasia Restrictions Weight Bearing Restrictions: No   Pain:  Pt denies pan this morning   Therapy/Group: Individual Therapy  Leroy Libman 07/25/2021, 10:03 AM

## 2021-07-25 NOTE — Progress Notes (Signed)
Inpatient Rehabilitation Discharge Medication Review by a Pharmacist  A complete drug regimen review was completed for this patient to identify any potential clinically significant medication issues.  High Risk Drug Classes Is patient taking? Indication by Medication  Antipsychotic No   Anticoagulant No   Antibiotic No   Opioid Yes Tylenol w/ codeine- pain  Antiplatelet Yes Aspirin/plavix- CVA prophylaxis  Hypoglycemics/insulin No   Vasoactive Medication Yes Coreg, clonidine, Nifedipine for BP control  Chemotherapy No   Other Yes Requip for RLS Zocor- HLD Allopurinol- gout prophyalxis Omeprazole- GERD     Type of Medication Issue Identified Description of Issue Recommendation(s)  Drug Interaction(s) (clinically significant)     Duplicate Therapy     Allergy     No Medication Administration End Date     Incorrect Dose     Additional Drug Therapy Needed     Significant med changes from prior encounter (inform family/care partners about these prior to discharge).    Other       Clinically significant medication issues were identified that warrant physician communication and completion of prescribed/recommended actions by midnight of the next day:  No  Pharmacist comments: None  Time spent performing this drug regimen review (minutes):  20 minutes   Jhonathan Desroches BS, PharmD, BCPS Clinical Pharmacist 07/25/2021 9:22 AM

## 2021-07-25 NOTE — Progress Notes (Signed)
PROGRESS NOTE   Subjective/Complaints:  Pt reports sleeping well- Had some abd pain last night, but took meds and a "tea" and had very large BM- feels MUCH better.   Also, new to the area- needs a PCP.  Asking if I could be her PCP- explained I'll see her as specialist, but not PCP. Will Ask SW to help.    ROS:   Pt denies SOB, abd pain, CP, N/V/C/D, and vision changes       Objective:   No results found. Recent Labs    07/23/21 1622  WBC 7.4  HGB 10.5*  HCT 32.1*  PLT 305    Recent Labs    07/23/21 1622  NA 138  K 3.3*  CL 99  CO2 28  GLUCOSE 189*  BUN 24*  CREATININE 4.95*  CALCIUM 9.1    Intake/Output Summary (Last 24 hours) at 07/25/2021 0902 Last data filed at 07/25/2021 0700 Gross per 24 hour  Intake 640 ml  Output --  Net 640 ml        Physical Exam: Vital Signs Blood pressure (!) 153/57, pulse 67, temperature 98.1 F (36.7 C), temperature source Oral, resp. rate 16, height 5\' 9"  (1.753 m), weight 60.7 kg, SpO2 100 %.        General: awake, alert, appropriate, laying on R side in bed; bright; NAD HENT: conjugate gaze; oropharynx moist CV: regular rate; no JVD Pulmonary: CTA B/L; no W/R/R- good air movement GI: soft, NT, ND, (+)BS- slightly hypoactive Psychiatric: appropriate; interactive Neurological: Ox3 Musculoskeletal:  Remains TTP over multiple rib fractures- anterior ribs, sternum- no change RUE 5-/5; LUE 5/5 RLE_ 5-/5; LLE 5/5  Skin:    General: Skin is warm and dry.     Comments: Heels slightly boggy B/L - resolved +Thrill LUE fistula Neurological:       Comments: Alert and oriented x 3. fairl insight and awareness. ?fair Memory. Normal language and speech. Cranial nerve exam unremarkable  Intact to light touch in all 4 extremities       Assessment/Plan: 1. Functional deficits which require 3+ hours per day of interdisciplinary therapy in a comprehensive  inpatient rehab setting. Physiatrist is providing close team supervision and 24 hour management of active medical problems listed below. Physiatrist and rehab team continue to assess barriers to discharge/monitor patient progress toward functional and medical goals  Care Tool:  Bathing    Body parts bathed by patient: Right arm, Left arm, Chest, Abdomen, Front perineal area, Buttocks, Right upper leg, Left upper leg, Right lower leg, Left lower leg, Face   Body parts bathed by helper: Right lower leg, Left lower leg     Bathing assist Assist Level: Contact Guard/Touching assist     Upper Body Dressing/Undressing Upper body dressing   What is the patient wearing?: Pull over shirt    Upper body assist Assist Level: Independent    Lower Body Dressing/Undressing Lower body dressing      What is the patient wearing?: Underwear/pull up, Pants     Lower body assist Assist for lower body dressing: Contact Guard/Touching assist     Toileting Toileting    Toileting assist Assist for toileting: Supervision/Verbal  cueing     Transfers Chair/bed transfer  Transfers assist     Chair/bed transfer assist level: Supervision/Verbal cueing     Locomotion Ambulation   Ambulation assist      Assist level: Supervision/Verbal cueing Assistive device: Walker-rolling Max distance: 200'   Walk 10 feet activity   Assist     Assist level: Supervision/Verbal cueing Assistive device: Walker-rolling   Walk 50 feet activity   Assist    Assist level: Supervision/Verbal cueing Assistive device: Walker-rolling    Walk 150 feet activity   Assist    Assist level: Supervision/Verbal cueing Assistive device: Walker-rolling    Walk 10 feet on uneven surface  activity   Assist Walk 10 feet on uneven surfaces activity did not occur: Safety/medical concerns         Wheelchair     Assist Is the patient using a wheelchair?: No             Wheelchair 50  feet with 2 turns activity    Assist            Wheelchair 150 feet activity     Assist          Blood pressure (!) 153/57, pulse 67, temperature 98.1 F (36.7 C), temperature source Oral, resp. rate 16, height 5\' 9"  (1.753 m), weight 60.7 kg, SpO2 100 %.    Medical Problem List and Plan: 1. Functional deficits secondary to anterior left MCA stroke/encephalomalacia further defined by MRI as multiple microembolic infarcts in that distribution; metabolic encephalopathy             -patient may  shower             -ELOS/Goals: 6-9 days supervision to mod I  Continue CIR- PT, OT and SLP- d/c 12/30  2.  Antithrombotics: -DVT/anticoagulation:  Pharmaceutical: Heparin             -antiplatelet therapy: DAPT>>Plavix, aspirin 235 mg daily for 3 months then aspirin alone 3. Pain Management: Tylenol,- has rib pain- might need lidoderm patches?  12/21- will order lidoderm patches 2- 8pm-8am and see if needs something stronger?  12/22- Tramadol was tried- will stop since made her anxious/restless- will start Tylenol PRN (get rid of scheduled) and tylenol #3 prn  12/24- discussed pain from rib fx's. She's hurting more today because she moved quite a bit yesterday   -will try aspercreme in addition to lidoderm, tylenol   -ordered kpad also  12/29- pt taking mainly tylenol- con't regimen 4. Mood: LCSW to assess and provide support             -antipsychotic agents: n/a 5. Neuropsych: This patient is capable of making decisions on her own behalf. Consider neuropsych referral  6. Skin/Wound Care: routine skin checks 7. Fluids/Electrolytes/Nutrition: Is and Os and follow-up chemistries.  --Chronic iHD. T/T/S --Tolerating diet. 8. ESRD: TTS schedule after therapies to allow full participation --[facility] clinic as outpatient 9. Acute on chronic hypoxic respiratory failure/pneumonia: completed ceftriaxone on 12/15.  --Currently normal SaO2 on RA 10: Anemia: Clio Jaconita and improving --  Aranesp per nephrology 11: Hypertension: Systolic BP averaging 024-097D. Continue coreg, nifedipine, clonidine  12/27- SBP stable in 150s/160s- con't regimen- don't want to drop due to HD.  12.29- Renal added Clonidine and increased Procardia- BP 150s/70s- con't regimen  12: Gout: no flare, continue allopurinol 13: GERD: continue Protonix 14: DM2: Hgb A1c = 5.7%.  --Continue CBGs checks, SSI 12/27- CBGs 106-187- con't to monitor 15: Grade 1  diastolic dysfunction: monitor  16: Bilateral carotid stenosis: will need annual carotid duplex follow-up 17: Hyperlipidemia: continue simvastatin 18: Rib fractures: aggressive pulmonary hygiene, encourage deep cough; IS.  --Consider outpatient PFTs/pulmonary follow-up 12/24 see above 19. Restless leg syndrome: continue Requip 20 Chronic constipation- will schedule Suppository every other day after dinner.    12/29- LBM overnight- con't regimen  21. Anxiety- new. Often pain related  12/22- likely tramadol- will stop and give vistaril 10 mg q8 hours prn.   12/23- resolved off tramadol.  22. Insomnia  12/26- will con't Melatonin 3 mg QHS prn and add Trazodone 50 mg QHS.   12/27-28:  slept great- con't regimen 23. Dispo  12/29- will get SW to help with getting her a PCP appt.    LOS: 9 days A FACE TO FACE EVALUATION WAS PERFORMED  Stacey Dorsey 07/25/2021, 9:02 AM

## 2021-07-25 NOTE — Progress Notes (Signed)
Occupational Therapy Discharge Summary  Patient Details  Name: Stacey Dorsey MRN: [id number] Date of Birth: 10-31-1932  Patient has met 40 of 10 long term goals due to improved activity tolerance, improved balance, postural control, ability to compensate for deficits, and improved coordination.  Pt made steady progress with BADLs and functional amb with RW during this admission. Pt completes all BADLs and functional transffers using RW with supervision. Pt will be returning home with daughter who will provide 24 hour supervision. Pt's daughter has been present for education. Patient to discharge at overall Supervision level.  Patient's care partner is independent to provide the necessary physical assistance at discharge.      Recommendation:  Patient will benefit from ongoing skilled OT services in home health setting to continue to advance functional skills in the area of BADL and Reduce care partner burden.  Equipment: No equipment provided  Reasons for discharge: treatment goals met and discharge from hospital  Patient/family agrees with progress made and goals achieved: Yes  OT Discharge Vision Baseline Vision/History: 0 No visual deficits Patient Visual Report: No change from baseline Vision Assessment?: No apparent visual deficits Perception  Perception: Within Functional Limits Praxis Praxis: Intact Cognition Overall Cognitive Status: Within Functional Limits for tasks assessed Arousal/Alertness: Awake/alert Orientation Level: Oriented X4 Year: 2022 Month: December Day of Week: Incorrect Attention: Selective Focused Attention: Appears intact Sustained Attention: Appears intact Selective Attention: Impaired Memory: Impaired Memory Impairment: Decreased recall of new information Immediate Memory Recall: Sock;Blue;Bed Memory Recall Sock: Without Cue Memory Recall Blue: Without Cue Memory Recall Bed: With Cue Awareness: Appears intact Problem Solving: Appears  intact Problem Solving Impairment: Functional basic;Functional complex Safety/Judgment: Appears intact Sensation Sensation Light Touch: Impaired by gross assessment Hot/Cold: Appears Intact Proprioception: Appears Intact Coordination Gross Motor Movements are Fluid and Coordinated: Yes Fine Motor Movements are Fluid and Coordinated: Yes Motor  Motor Motor: Abnormal postural alignment and control Motor - Skilled Clinical Observations: limited by weakness and rib pain Trunk/Postural Assessment  Cervical Assessment Cervical Assessment: Within Functional Limits Thoracic Assessment Thoracic Assessment: Exceptions to [facility] (rounded shoulders) Lumbar Assessment Lumbar Assessment: Within Functional Limits  Balance Static Sitting Balance Static Sitting - Level of Assistance: 6: Modified independent (Device/Increase time) Dynamic Sitting Balance Dynamic Sitting - Balance Support: During functional activity Dynamic Sitting - Level of Assistance: 5: Stand by assistance Extremity/Trunk Assessment RUE Assessment RUE Assessment: Within Functional Limits General Strength Comments: WFL for ADL tasks LUE Assessment LUE Assessment: Within Functional Limits General Strength Comments: WFL for ADL tasks   Leroy Libman 07/25/2021, 10:03 AM

## 2021-07-25 NOTE — Progress Notes (Signed)
Physical Therapy Session Note  Patient Details  Name: Stacey Dorsey MRN: [id number] Date of Birth: [birth year]  Today's Date: 07/25/2021 PT Individual Time: 1020-1130 PT Individual Time Calculation (min): 70 min   Short Term Goals: Week 1:  PT Short Term Goal 1 (Week 1): =LTG due to ELOS  Skilled Therapeutic Interventions/Progress Updates: Pt presented in bed with nsg and dgt present agreeable to therapy. Pt denies pain at start of session but did have rib pain at end of session with rest breaks provided as needed. Session to focus on family education with dgt, with focus on functional mobility in preparation for d/c. As pt received meds from nsg PTA provided information to dgt regarding current functional status and level of assist required at d/c (supervision currently and supervision for d/c). Pt performed bed mobility with supervision and use of bed features. Pt ambulated with RW to ortho gym with supervision and participated in car transfer with supervision. Pt then ambulated up/down ramp and ambulated with RW on mulch (supervision) and without AD (CGA). Pt then ambulated to rehab gym and after seated rest demonstrated ascending/descending x 2 steps without AD then additional 10 steps with B rails at supervision level. Pt then ambulated without AD to high/low mat with CGA and participated in use of rebounder forward and with L/R rotation for dynamic balance and endurance. Pt ambulated without AD weaving through cones with CGA and straight path with close supervision. Pt also ambulated and performed lateral toe taps to cones with CGA. During seated rest dgt with question about bed mobility in standard bed. Pt then ambulated with RW to ADL and performed bed mobility to flat bed with mod I. Pt did c//o increased rib pain when performing supine to sit but resolved with rest. Pt then ambulated back to room with RW and supervision and returned to bed at end of session. Pt left with bed alarm on, call bell  within reach and needs met.      Therapy Documentation Precautions:  Precautions Precautions: Fall Precaution Comments: rib fxs (R4, 6-8 and L 6-9th), expressive aphasia Restrictions Weight Bearing Restrictions: No General:   Vital Signs: Therapy Vitals Temp: 98.2 F (36.8 C) Temp Source: Temporal Pulse Rate: 65 Resp: 15 BP: (!) 142/59 Patient Position (if appropriate): Lying Oxygen Therapy SpO2: 94 % Pain: Pain Assessment Pain Scale: 0-10 Pain Score: 0-No pain Mobility:   Locomotion :    Trunk/Postural Assessment :    Balance:   Exercises:   Other Treatments:      Therapy/Group: Individual Therapy  Nayib Remer 07/25/2021, 4:24 PM

## 2021-07-25 NOTE — Progress Notes (Signed)
Patient ID: Stacey Dorsey, female   DOB: 11-08-1932, 87 y.o.   MRN: [id number] S: no c/o, seen in HD  O:BP (!) 119/58    Pulse 69    Temp 98.2 F (36.8 C) (Temporal)    Resp 17    Ht 5\' 9"  (1.753 m)    Wt 61 kg    SpO2 94%    BMI 19.86 kg/m   Intake/Output Summary (Last 24 hours) at 07/25/2021 1706 Last data filed at 07/25/2021 1300 Gross per 24 hour  Intake 460 ml  Output --  Net 460 ml    Intake/Output: I/O last 3 completed shifts: In: 840 [P.O.:840] Out: -   Intake/Output this shift:  Total I/O In: 220 [P.O.:220] Out: -  Weight change:  Gen:NAD CVS: RRR Resp:CTA Abd: +BS, soft, NT/ND Ext: no edema, LUE AVG +T/B   OP HD: AF TTS  3.5h   64.5kg  2/2 bath  400/500   LUA AVG  Hep none   - sensipar 30 ug tiw po  - hectorol 5 ug tiw IV  - mircera 75 ug q 2wks , last 12/01   - venofer 50 mg q. Weekly        Assessment/Plan: Acute CVA - L MCA by CT and MRI, +aphasia, on DAPT. Microembolic infarctions. EP consulted for loop recorder.  Now in CIR. PEA arrest - Out of hospital, per primary.   ESRD - TTS HD. Pt refusing HD today (rollover from the weekend). Plan HD tomorrow to get back on schedule.  Rib fractures/ chest pain - due to CPR  Pneumonia- improved, completed course of Rocephin/ azithro IV here HTN/vol - BP's high. Coreg/ procardia / clonidine here.  small UF 1-2 L here Anemia-  Hb 9- 10. Will reorder Aranesp 60ug weekly SQ.  CKD-BMD- Ca ok, resumed auryxia-phos better now AMS - with tramadol.  Avoid in the future.  Dispo - possibly dc tomorrow per the pt  Kelly Splinter, MD 07/24/2021, 12:10 PM      Recent Labs  Lab 07/21/21 1000 07/23/21 1622  NA 138 138  K 4.4 3.3*  CL 103 99  CO2 25 28  GLUCOSE 137* 189*  BUN 51* 24*  CREATININE 8.86* 4.95*  ALBUMIN 2.7* 3.1*  CALCIUM 8.8* 9.1  PHOS 4.6 2.5    Liver Function Tests: Recent Labs  Lab 07/21/21 1000 07/23/21 1622  ALBUMIN 2.7* 3.1*    No results for input(s): LIPASE, AMYLASE in the last  168 hours. No results for input(s): AMMONIA in the last 168 hours. CBC: Recent Labs  Lab 07/21/21 1000 07/23/21 1622  WBC 7.9 7.4  HGB 8.8* 10.5*  HCT 28.4* 32.1*  MCV 91.6 90.2  PLT 342 305     Studies/Results: No results found.  allopurinol  100 mg Oral Daily   aspirin  325 mg Oral Daily   bisacodyl  10 mg Rectal QODAY   carvedilol  25 mg Oral BID WC   cinacalcet  30 mg Oral Q T,Th,Sa-HD   cloNIDine  0.1 mg Oral BID   clopidogrel  75 mg Oral Daily   darbepoetin (ARANESP) injection - NON-DIALYSIS  60 mcg Subcutaneous Q Thu-1800   doxercalciferol  5 mcg Intravenous Q T,Th,Sa-HD   feeding supplement (NEPRO CARB STEADY)  237 mL Oral BID   ferric citrate  210 mg Oral TID WC   heparin  5,000 Units Subcutaneous Q8H   lidocaine  2 patch Transdermal Q24H   lidocaine-prilocaine  1 application Topical Once per day  on Mon Wed Fri   mometasone-formoterol  2 puff Inhalation BID   multivitamin  1 tablet Oral QHS   NIFEdipine  60 mg Oral QHS   pantoprazole  40 mg Oral Daily   rOPINIRole  0.5 mg Oral QHS   senna-docusate  2 tablet Oral BID   simvastatin  20 mg Oral QHS   traZODone  50 mg Oral QHS

## 2021-07-25 NOTE — Progress Notes (Signed)
Speech Language Pathology Discharge Summary  Patient Details  Name: Stacey Dorsey MRN: [id number] Date of Birth: 04-Jan-1933  Today's Date: 07/25/2021 SLP Individual Time: 0930-1010 SLP Individual Time Calculation (min): 40 min   Skilled Therapeutic Interventions:  Patient seen with daughter present for skilled ST session focused on cognitive function goals and discharge planning. Patient did not recognize this SLP (has not seen her since initial evaluation) but when SLP told her some of the things we had talked about, she did seem to recall somewhat. She also became a little upset that she was not able to recall. SLP discussed patient's good progress and her improved abilities with language for word finding errors and completing cognitive tasks. Daughter reported that patient had a CVA prior and at the time she didn't think much change has occurred but she now feels that patient's current deficits are likely her baseline since that previous CVA. SLP stressed importance of patient and daughter working together as a team and focus on managing tasks, etc. Around the home. Patient inquiring about being able to go out in community, "going to the store", etc. SLP urged patient to discuss with PT while here but also wait to try such things when [facility] PT is present. Patient and daughter both verbalized understanding.     Patient has met 4 of 4 long term goals.  Patient to discharge at overall Modified Independent level.  Reasons goals not met: N/A   Clinical Impression/Discharge Summary: Patient made very good progress during her CIR stay and at time of discharge is at a modified independent level for cognitive functioning at complex level. As per patient's daughter, patient is likely at her baseline as she has had a previous CVA. Patient will benefit from supervision to ensure safety as she does seem to want to get back into all her previous activities (going to stores, etc) as soon as possible and could  potentially make unsafe decisions if attempting to ambulate out in community too soon. Patient appropriate for discharge at this time.  Care Partner:  Caregiver Able to Provide Assistance: Yes  Type of Caregiver Assistance: Physical;Cognitive  Recommendation:  Home Health SLP  Rationale for SLP Follow Up: Maximize cognitive function and independence   Equipment: None for ST   Reasons for discharge: Treatment goals met;Discharged from hospital   Patient/Family Agrees with Progress Made and Goals Achieved: Yes    Sonia Baller, MA, CCC-SLP Speech Therapy

## 2021-07-25 NOTE — Progress Notes (Signed)
Patient ID: Stacey Dorsey, female   DOB: 17-Oct-1932, 87 y.o.   MRN: [id number]  Per PCP, pt reported need PCP.  SW met with pt and pt dtr [name] in room to discuss discharge. SW informed on HHA- [facility]. Pt dtr reports that pt will need RW. SW will order. SW confirms pt has a PCP.  SW ordered RW with [facility] via parachute.   Loralee Pacas, MSW, [place] Office: [phone] Cell: [phone] Fax: [phone]

## 2021-07-26 MED ORDER — FERRIC CITRATE 1 GM 210 MG(FE) PO TABS: 210.0000 mg | ORAL_TABLET | Freq: Three times a day (TID) | ORAL | 0 refills | Status: AC

## 2021-07-26 MED ORDER — CARVEDILOL 25 MG PO TABS: 25.0000 mg | ORAL_TABLET | ORAL | 0 refills | Status: DC

## 2021-07-26 MED ORDER — ADVAIR HFA 115-21 MCG/ACT IN AERO: 1.0000 | INHALATION_SPRAY | Freq: Two times a day (BID) | RESPIRATORY_TRACT | 12 refills | Status: AC

## 2021-07-26 MED ORDER — ACETAMINOPHEN-CODEINE #3 300-30 MG PO TABS
1.0000 | ORAL_TABLET | ORAL | 0 refills | Status: DC | PRN
Start: 2021-07-26 — End: 2021-09-23

## 2021-07-26 MED ORDER — DOXERCALCIFEROL 4 MCG/2ML IV SOLN: 5.0000 ug | INTRAVENOUS | Status: AC

## 2021-07-26 MED ORDER — DARBEPOETIN ALFA 60 MCG/0.3ML IJ SOSY: 60.0000 ug | PREFILLED_SYRINGE | INTRAMUSCULAR | Status: DC

## 2021-07-26 MED ORDER — OMEPRAZOLE 20 MG PO CPDR: 40.0000 mg | DELAYED_RELEASE_CAPSULE | Freq: Every day | ORAL | 0 refills | Status: AC

## 2021-07-26 MED ORDER — NIFEDIPINE ER OSMOTIC RELEASE 30 MG PO TB24: 60.0000 mg | ORAL_TABLET | Freq: Every day | ORAL | 0 refills | Status: AC

## 2021-07-26 MED ORDER — CLOPIDOGREL BISULFATE 75 MG PO TABS: 75.0000 mg | ORAL_TABLET | Freq: Every day | ORAL | 0 refills | Status: DC

## 2021-07-26 MED ORDER — CLOPIDOGREL BISULFATE 75 MG PO TABS
75.0000 mg | ORAL_TABLET | Freq: Every day | ORAL | 0 refills | Status: DC
Start: 2021-07-27 — End: 2021-09-12

## 2021-07-26 MED ORDER — POLYETHYLENE GLYCOL 3350 17 G PO PACK: 17.0000 g | PACK | Freq: Every day | ORAL | 0 refills | Status: AC

## 2021-07-26 MED ORDER — CLONIDINE HCL 0.1 MG PO TABS: 0.1000 mg | ORAL_TABLET | Freq: Two times a day (BID) | ORAL | 11 refills | Status: AC

## 2021-07-26 MED ORDER — CINACALCET HCL 30 MG PO TABS: 30.0000 mg | ORAL_TABLET | ORAL | 0 refills | Status: AC

## 2021-07-26 MED ORDER — TRAZODONE HCL 50 MG PO TABS: 50.0000 mg | ORAL_TABLET | Freq: Every evening | ORAL | 0 refills | Status: DC | PRN

## 2021-07-26 MED ORDER — LIDOCAINE 5 % EX PTCH: 2.0000 | MEDICATED_PATCH | CUTANEOUS | 0 refills | Status: AC

## 2021-07-26 MED ORDER — ASPIRIN 325 MG PO TBEC: 325.0000 mg | DELAYED_RELEASE_TABLET | Freq: Every day | ORAL | Status: DC

## 2021-07-26 MED ORDER — CARVEDILOL 25 MG PO TABS: 25.0000 mg | ORAL_TABLET | Freq: Two times a day (BID) | ORAL | 0 refills | Status: DC

## 2021-07-26 MED ORDER — RENA-VITE PO TABS: 1.0000 | ORAL_TABLET | Freq: Every day | ORAL | 0 refills | Status: DC

## 2021-07-26 NOTE — Progress Notes (Signed)
Discharge:  Pt d/c from room via wheelchair, Family member with the pt.  Discharge instructions given to the patient and family members. .  Pt dressed in street clothes and left with discharge papers and prescriptions  in hand.  IV d/ced, and no complaints of pain or discomfort.

## 2021-07-26 NOTE — Progress Notes (Signed)
Pt to d/c from inpt rehab today. Contacted [facility] SW to make clinic aware of pt's d/c today and pt will resume care tomorrow.   Melven Sartorius Renal Navigator [phone]

## 2021-07-26 NOTE — Progress Notes (Signed)
Inpatient Rehabilitation Care Coordinator Discharge Note   Patient Details  Name: Stacey Dorsey MRN: [id number] Date of Birth: 1932/08/05   Discharge location: D/c to home with 24/7 care from dtr.  Length of Stay: 8 days  Discharge activity level: [place]  Home/community participation: Limited  Patient response QG:BEEFEO Literacy - How often do you need to have someone help you when you read instructions, pamphlets, or other written material from your doctor or pharmacy?: Rarely  Patient response FH:QRFXJO Isolation - How often do you feel lonely or isolated from those around you?: Never  Services provided included: MD, RD, PT, OT, SLP, RN, CM, TR, Pharmacy, Neuropsych, SW  Financial Services:  [place] [employer]  Choices offered to/list presented to: Yes  Follow-up services arranged:  Home Health, DME Home Health Agency: [facility] for HHPT/OT    DME : [facility] for RW   Patient response to transportation need: Is the patient able to respond to transportation needs?: Yes In the past 12 months, has lack of transportation kept you from medical appointments or from getting medications?: No In the past 12 months, has lack of transportation kept you from meetings, work, or from getting things needed for daily living?: No  Comments (or additional information):  Patient/Family verbalized understanding of follow-up arrangements:  Yes  Individual responsible for coordination of the follow-up plan: contact pt dtr [name] [phone]  Confirmed correct DME delivered: Rana Snare 07/26/2021    Rana Snare

## 2021-07-26 NOTE — Progress Notes (Signed)
PROGRESS NOTE   Subjective/Complaints:  Pt sleeping well per pt- Ready for d/c today. SW has addressed getting her PCP per SW.  No issues.    ROS:   Pt denies SOB, abd pain, CP, N/V/C/D, and vision changes       Objective:   No results found. Recent Labs    07/23/21 1622  WBC 7.4  HGB 10.5*  HCT 32.1*  PLT 305    Recent Labs    07/23/21 1622  NA 138  K 3.3*  CL 99  CO2 28  GLUCOSE 189*  BUN 24*  CREATININE 4.95*  CALCIUM 9.1    Intake/Output Summary (Last 24 hours) at 07/26/2021 0811 Last data filed at 07/25/2021 1738 Gross per 24 hour  Intake 220 ml  Output 500 ml  Net -280 ml        Physical Exam: Vital Signs Blood pressure (!) 167/57, pulse 67, temperature 98.3 F (36.8 C), temperature source Oral, resp. rate 16, height 5\' 9"  (1.753 m), weight 60.1 kg, SpO2 100 %.         General: awake, alert, appropriate, laying on R side in bed facing door;  NAD HENT: conjugate gaze; oropharynx moist CV: regular rate; no JVD Pulmonary: CTA B/L; no W/R/R- good air movement GI: soft, NT, ND, (+)BS Psychiatric: appropriate; bright affect Neurological: poor memory, but Ox2-3 Musculoskeletal:  Remains TTP over multiple rib fractures- anterior ribs, sternum- no change RUE 5-/5; LUE 5/5 RLE_ 5-/5; LLE 5/5  Skin:    General: Skin is warm and dry.     Comments: Heels slightly boggy B/L - resolved +[place] LUE fistula Neurological:       Comments: Alert and oriented x 3. fairl insight and awareness. ?fair Memory. Normal language and speech. Cranial nerve exam unremarkable  Intact to light touch in all 4 extremities       Assessment/Plan: 1. Functional deficits which require 3+ hours per day of interdisciplinary therapy in a comprehensive inpatient rehab setting. Physiatrist is providing close team supervision and 24 hour management of active medical problems listed below. Physiatrist and rehab  team continue to assess barriers to discharge/monitor patient progress toward functional and medical goals  Care Tool:  Bathing    Body parts bathed by patient: Right arm, Left arm, Chest, Abdomen, Front perineal area, Buttocks, Right upper leg, Left upper leg, Right lower leg, Left lower leg, Face   Body parts bathed by helper: Right lower leg, Left lower leg     Bathing assist Assist Level: Supervision/Verbal cueing     Upper Body Dressing/Undressing Upper body dressing   What is the patient wearing?: Pull over shirt    Upper body assist Assist Level: Independent    Lower Body Dressing/Undressing Lower body dressing      What is the patient wearing?: Underwear/pull up, Pants     Lower body assist Assist for lower body dressing: Supervision/Verbal cueing     Toileting Toileting    Toileting assist Assist for toileting: Supervision/Verbal cueing     Transfers Chair/bed transfer  Transfers assist     Chair/bed transfer assist level: Supervision/Verbal cueing     Locomotion Ambulation   Ambulation assist  Assist level: Supervision/Verbal cueing Assistive device: Walker-rolling Max distance: 200'   Walk 10 feet activity   Assist     Assist level: Supervision/Verbal cueing Assistive device: Walker-rolling   Walk 50 feet activity   Assist    Assist level: Supervision/Verbal cueing Assistive device: Walker-rolling    Walk 150 feet activity   Assist    Assist level: Supervision/Verbal cueing Assistive device: Walker-rolling    Walk 10 feet on uneven surface  activity   Assist Walk 10 feet on uneven surfaces activity did not occur: Safety/medical concerns   Assist level: Contact Guard/Touching assist Assistive device: Walker-rolling   Wheelchair     Assist Is the patient using a wheelchair?: No             Wheelchair 50 feet with 2 turns activity    Assist            Wheelchair 150 feet activity      Assist          Blood pressure (!) 167/57, pulse 67, temperature 98.3 F (36.8 C), temperature source Oral, resp. rate 16, height 5\' 9"  (1.753 m), weight 60.1 kg, SpO2 100 %.    Medical Problem List and Plan: 1. Functional deficits secondary to anterior left MCA stroke/encephalomalacia further defined by MRI as multiple microembolic infarcts in that distribution; metabolic encephalopathy             -patient may  shower             -ELOS/Goals: 6-9 days supervision to mod I  12/30- d/c today- has PCP 2.  Antithrombotics: -DVT/anticoagulation:  Pharmaceutical: Heparin             -antiplatelet therapy: DAPT>>Plavix, aspirin 235 mg daily for 3 months then aspirin alone 3. Pain Management: Tylenol,- has rib pain- might need lidoderm patches?  12/21- will order lidoderm patches 2- 8pm-8am and see if needs something stronger?  12/22- Tramadol was tried- will stop since made her anxious/restless- will start Tylenol PRN (get rid of scheduled) and tylenol #3 prn  12/24- discussed pain from rib fx's. She's hurting more today because she moved quite a bit yesterday   -will try aspercreme in addition to lidoderm, tylenol   -ordered kpad also  12/29- pt taking mainly tylenol- con't regimen  12/30- con't regimen- can be discharged with some Tylenol #3 4. Mood: LCSW to assess and provide support             -antipsychotic agents: n/a 5. Neuropsych: This patient is capable of making decisions on her own behalf. Consider neuropsych referral  6. Skin/Wound Care: routine skin checks 7. Fluids/Electrolytes/Nutrition: Is and Os and follow-up chemistries.  --Chronic iHD. T/T/S --Tolerating diet. 8. ESRD: TTS schedule after therapies to allow full participation --[facility] clinic as outpatient 12/30- K+ was 3.3 earlier in week- Renal addressed 9. Acute on chronic hypoxic respiratory failure/pneumonia: completed ceftriaxone on 12/15.  --Currently normal SaO2 on RA 10: Anemia: [place] and  improving -- Aranesp per nephrology 11: Hypertension: Systolic BP averaging 025-427C. Continue coreg, nifedipine, clonidine  12/27- SBP stable in 150s/160s- con't regimen- don't want to drop due to HD.  12.29- Renal added Clonidine and increased Procardia- BP 150s/70s- con't regimen  12: Gout: no flare, continue allopurinol 13: GERD: continue Protonix 14: DM2: Hgb A1c = 5.7%.  --Continue CBGs checks, SSI 12/27- CBGs 106-187- con't to monitor 15: Grade 1 diastolic dysfunction: monitor  16: Bilateral carotid stenosis: will need annual carotid duplex follow-up 17: Hyperlipidemia:  continue simvastatin 18: Rib fractures: aggressive pulmonary hygiene, encourage deep cough; IS.  --Consider outpatient PFTs/pulmonary follow-up 12/24 see above 19. Restless leg syndrome: continue Requip 20 Chronic constipation- will schedule Suppository every other day after dinner.    12/29- LBM overnight- con't regimen  21. Anxiety- new. Often pain related  12/22- likely tramadol- will stop and give vistaril 10 mg q8 hours prn.   12/23- resolved off tramadol.  22. Insomnia  12/26- will con't Melatonin 3 mg QHS prn and add Trazodone 50 mg QHS.   12/27-28:  slept great- con't regimen  12/30- will send home on Trazodone 23. Dispo  12/29- will get SW to help with getting her a PCP appt.   12/30- can send home with some Tylenol #3; PCP issue addressed per SW   LOS: 10 days A FACE TO FACE EVALUATION WAS PERFORMED  Eyanna Mcgonagle 07/26/2021, 8:11 AM

## 2021-07-26 NOTE — Discharge Summary (Addendum)
Physician Discharge Summary  Patient ID: Indya Dolce MRN: [id number] DOB/AGE: [birth year] 87 y.o.  Admit date: 07/16/2021 Discharge date: 07/26/2021  Discharge Diagnoses:  Principal Problem:   Acute ischemic left MCA stroke [facility]) a functional deficits due to left MCA stroke Active problems: End-stage renal disease on chronic intermittent hemodialysis Acute on chronic hypoxic respiratory failure Anemia Hypertension Rib fractures Aphasia Restless leg syndrome Chronic constipation  Discharged Condition: good  Significant Diagnostic Studies: DG Abd 1 View  Result Date: 07/04/2021 CLINICAL DATA:  OG tube. EXAM: ABDOMEN - 1 VIEW COMPARISON:  CT chest 07/04/2021. FINDINGS: Orogastric tube tip is at the gastric fundus. No dilated bowel loops are seen. Small right pleural effusion and right basilar infiltrates persist. Endotracheal tube tip is 2 cm above the carina. Cardiomediastinal silhouette within normal limits. IMPRESSION: 1. Orogastric tube tip in the gastric fundus. Electronically Signed   By: Ronney Asters M.D.   On: 07/04/2021 18:36   CT HEAD WO CONTRAST (5MM)  Result Date: 07/07/2021 CLINICAL DATA:  87 year old female TIA. EXAM: CT HEAD WITHOUT CONTRAST TECHNIQUE: Contiguous axial images were obtained from the base of the skull through the vertex without intravenous contrast. COMPARISON:  Head CT 07/04/2021. FINDINGS: Brain: Stable bilateral basal ganglia vascular calcifications. Stable encephalomalacia in the left middle frontal gyrus along the anterior insula and operculum. Elsewhere gray-white matter differentiation remains normal for age. No superimposed midline shift, ventriculomegaly, mass effect, evidence of mass lesion, intracranial hemorrhage or evidence of cortically based acute infarction. Vascular: Calcified atherosclerosis at the skull base. No suspicious intracranial vascular hyperdensity. Skull: Questionable chronic left orbital floor fracture. No acute osseous  abnormality identified. Sinuses/Orbits: Visualized paranasal sinuses and mastoids are stable and well aerated. Other: No acute orbit or scalp soft tissue finding. IMPRESSION: 1. Stable non contrast CT appearance of the brain. No acute or evolving infarct is identified. 2. Stable chronic appearing left MCA anterior division territory infarct. Electronically Signed   By: Genevie Ann M.D.   On: 07/07/2021 11:56   CT Head Wo Contrast  Result Date: 07/04/2021 CLINICAL DATA:  Altered mental status EXAM: CT HEAD WITHOUT CONTRAST TECHNIQUE: Contiguous axial images were obtained from the base of the skull through the vertex without intravenous contrast. COMPARISON:  None. FINDINGS: Brain: There are no signs of bleeding within the cranium. There is moderate sized area of decreased density in the left frontal lobe suggesting old infarct. Cortical sulci are prominent, more so in the left temporal region. Calcifications are seen in the basal ganglia on both sides. Vascular: There are scattered arterial calcifications. Skull: Unremarkable Sinuses/Orbits: There are no air-fluid levels in the paranasal sinuses. There is mild mucosal thickening in the ethmoid and sphenoid sinuses. Secretions are seen in the lumen of nasopharynx. Other: There is increased amount of CSF insula suggesting partial empty sella. IMPRESSION: No acute intracranial findings are seen in noncontrast CT brain. 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 Atrophy. Electronically Signed   By: Elmer Picker M.D.   On: 07/04/2021 10:49   CT Angio Chest PE W and/or Wo Contrast  Result Date: 07/04/2021 CLINICAL DATA:  CPR with ROSC obtained, suspect PE EXAM: CT ANGIOGRAPHY CHEST WITH CONTRAST TECHNIQUE: Multidetector CT imaging of the chest was performed using the standard protocol during bolus administration of intravenous contrast. Multiplanar CT image reconstructions and MIPs were obtained to evaluate the vascular anatomy. CONTRAST:   122mL OMNIPAQUE IOHEXOL 350 MG/ML SOLN COMPARISON:  Same-day chest radiograph, CT chest 01/01/2021 FINDINGS: Cardiovascular: There is adequate opacification of  the pulmonary arteries to the subsegmental level. There is no evidence of pulmonary embolism. The heart is enlarged. There are mitral annular calcifications, mild aortic valve calcifications, and scattered coronary artery calcifications. There is mild calcified atherosclerotic plaque in the nonaneurysmal thoracic aorta. There is reflux of contrast into the IVC suggesting right heart dysfunction. Mediastinum/Nodes: The thyroid is unremarkable. The esophagus is grossly unremarkable. There is no mediastinal, hilar, or axillary lymphadenopathy. Lungs/Pleura: The endotracheal tube tip is approximally 6 mm from the carina and approaches the right main bronchus. The trachea and central airways are patent. There is consolidation in the dependent lower lobes, right significantly worse than left. There are additional patchy opacities and interlobular septal thickening in the lung apices and left lung base. There are small bilateral pleural effusions. There is no pneumothorax. Upper Abdomen: The enteric catheter tip is in the distal stomach. A right adrenal adenoma is unchanged there are no acute findings in the upper abdomen. Musculoskeletal: There are acute fractures of the right fourth and sixth through eighth ribs and left sixth through ninth ribs. Review of the MIP images confirms the above findings. IMPRESSION: 1. No evidence of pulmonary embolism. 2. Findings above suggesting heart failure including cardiomegaly, patchy ground-glass opacities and interlobular septal thickening likely reflecting mild to moderate pulmonary interstitial edema, and small bilateral pleural effusions. Additionally, reflux of contrast into the IVC suggests right heart dysfunction. 3. Consolidations in the bilateral lower lobes may reflect atelectasis, though pneumonia can not be  excluded. 4. Multiple bilateral rib fractures likely related to CPR. 5. Endotracheal tube tip approaching the right main bronchus. Recommend retraction by approximately 1-2 cm. Electronically Signed   By: Valetta Mole M.D.   On: 07/04/2021 13:21   MR ANGIO HEAD WO CONTRAST  Result Date: 07/09/2021 CLINICAL DATA:  Stroke follow-up EXAM: MRA HEAD WITHOUT CONTRAST TECHNIQUE: Angiographic images of the Circle of Willis were acquired using MRA technique without intravenous contrast. COMPARISON:  MRI 07/08/2021 FINDINGS: Evaluation is somewhat limited by motion artifact. Anterior circulation: Both internal carotid arteries are patent to the termini, although there is moderate to severe narrowing in the left supraclinoid portion (series 3, image 75). Hypoplastic or aplastic left A1. Normal right A1. Normal anterior communicating artery. Anterior cerebral arteries are patent to their distal aspects. No M1 stenosis or occlusion. Normal MCA bifurcations. Distal MCA branches perfused, although there is decreased perfusion in the left distal MCA branches, without focal stenosis or occlusion. Posterior circulation: Multifocal irregularity in the right vertebral artery, which appears occluded just proximal to the vertebrobasilar junction. The left vertebral artery is dominant and patent to the vertebrobasilar junction without stenosis. Basilar patent to its distal aspect. Superior cerebellar arteries patent bilaterally. PCAs perfused to their distal aspects without stenosis. The right posterior communicating artery is patent. The left posterior communicating artery is not definitively seen. Anatomic variants: None significant Other: None. IMPRESSION: 1. Evaluation is limited by motion artifact. Within this limitation, there is moderate to severe narrowing in the left supraclinoid ICA. Decreased perfusion in the left distal MCA branches, without focal MCA stenosis or occlusion. 2. Multifocal narrowing of the right vertebral  artery, which appears occluded just proximal to the vertebrobasilar junction. Electronically Signed   By: Merilyn Baba M.D.   On: 07/09/2021 20:15   MR BRAIN WO CONTRAST  Result Date: 07/08/2021 CLINICAL DATA:  Neuro deficit, acute, stroke suspected. EXAM: MRI HEAD WITHOUT CONTRAST TECHNIQUE: Multiplanar, multiecho pulse sequences of the brain and surrounding structures were obtained without intravenous contrast. COMPARISON:  Head CT yesterday. FINDINGS: Brain: Diffusion imaging shows approximately 10 punctate acute infarctions scattered within the left MCA territory consistent with micro embolic infarctions. No large or confluent acute infarction. No focal abnormality affects the brainstem or cerebellum. Old small vessel infarction in the right thalamus. Mild chronic small-vessel ischemic changes elsewhere within the cerebral hemispheric white matter. 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 No mass, hydrocephalus or extra-axial collection. Vascular: Major vessels at the base of the brain show flow. Skull and upper cervical spine: Negative Sinuses/Orbits: Clear/normal Other: Small right mastoid effusion. IMPRESSION: Approximately 10 punctate acute infarctions scattered within the left MCA territory consistent with micro embolic infarctions. 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 Mild chronic small-vessel ischemic changes elsewhere within the cerebral hemispheric white matter. Old lacunar infarction right thalamus. Electronically Signed   By: Nelson Chimes M.D.   On: 07/08/2021 19:52   DG Chest Port 1 View  Result Date: 07/06/2021 CLINICAL DATA:  Acute respiratory failure with hypoxia. EXAM: PORTABLE CHEST 1 VIEW COMPARISON:  July 05, 2021. FINDINGS: Stable cardiomegaly. Endotracheal and nasogastric tubes have been removed. Bibasilar atelectasis is noted, right greater than left. Small pleural effusions may be present. Bony thorax is unremarkable.  IMPRESSION: Endotracheal and nasogastric tubes have been removed. Stable bibasilar atelectasis is noted with small pleural effusions. Electronically Signed   By: Marijo Conception M.D.   On: 07/06/2021 09:18   DG Chest Port 1 View  Result Date: 07/05/2021 CLINICAL DATA:  Difficulty breathing EXAM: PORTABLE CHEST 1 VIEW COMPARISON:  Previous studies including the examination of 07/04/2021 FINDINGS: Transverse diameter of heart is increased. There is interval decrease in pulmonary vascular congestion. There is improvement in aeration of parahilar regions and lower lung fields. Residual increased markings are seen in the medial lower lung fields. There is blunting of both lateral CP angles. There is no pneumothorax. Tip of endotracheal tube is 2.9 cm above the carina. Tip of enteric tube is seen in the fundus of the stomach. IMPRESSION: There is interval decrease in pulmonary vascular congestion and pulmonary edema. Increased markings in the medial lower lung fields may suggest crowding of normal bronchovascular structures due to poor inspiration or atelectasis/pneumonitis. Small bilateral pleural effusions are seen. Electronically Signed   By: Elmer Picker M.D.   On: 07/05/2021 08:13   DG Chest Portable 1 View  Result Date: 07/04/2021 CLINICAL DATA:  Code, post intubation EXAM: PORTABLE CHEST 1 VIEW COMPARISON:  01/01/2021 FINDINGS: Endotracheal tube is 3 cm above the carina. NG tube tip is near the GE junction. No confluent opacity on the left. Patchy airspace disease in the right perihilar region and right lower lobe with volume loss and elevation of the right hemidiaphragm. No visible effusions or pneumothorax. No acute bony abnormality. IMPRESSION: Airspace disease in the right perihilar region and right lower lobe concerning for pneumonia. Volume loss on the right. Electronically Signed   By: Rolm Baptise M.D.   On: 07/04/2021 10:13   EEG adult  Result Date: 07/09/2021 Lora Havens, MD      07/09/2021  8:15 AM Patient Name: Denis Cauble MRN: [id number] Epilepsy Attending: Lora Havens Referring Physician/Provider: Anibal Henderson, NP Date: 07/08/2021 Duration: 23.01 mins Patient history: 87 y.o. female who presented to the hospital 12/8 for evaluation of out-of-hospital cardiac arrest with 15 minutes of CPR prior to ROSC.  EEG to evaluate for seizure. Level of alertness: Awake AEDs during EEG study: None Technical aspects: This EEG study  was done with scalp electrodes positioned according to the 10-20 International system of electrode placement. Electrical activity was acquired at a sampling rate of 500Hz  and reviewed with a high frequency filter of 70Hz  and a low frequency filter of 1Hz . EEG data were recorded continuously and digitally stored. Description: The posterior dominant rhythm consists of 9 Hz activity of moderate voltage (25-35 uV) seen predominantly in posterior head regions, symmetric and reactive to eye opening and eye closing. EEG showed intermittent generalized 3 to 6 Hz theta-delta slowing. Hyperventilation and photic stimulation were not performed.   ABNORMALITY - Intermittent slow, generalized IMPRESSION: This study is suggestive of mild diffuse encephalopathy, nonspecific etiology. No seizures or epileptiform discharges were seen throughout the recording. Lora Havens   ECHOCARDIOGRAM COMPLETE  Result Date: 07/04/2021    ECHOCARDIOGRAM REPORT   Patient Name:   LEONOR DARNELL Date of Exam: 07/04/2021 Medical Rec #:  [id number]     Height:       69.0 in Accession #:    [id number]    Weight:       176.4 lb Date of Birth:  10-May-1933     BSA:          1.958 m Patient Age:    42 years      BP:           173/87 mmHg Patient Gender: F             HR:           69 bpm. Exam Location:  Inpatient Procedure: 2D Echo Indications:    cardiac arrest  History:        Patient has no prior history of Echocardiogram examinations.                 Kidney disease; Signs/Symptoms:Shortness of  Breath.  Sonographer:    Johny Chess RDCS Referring Phys: [id number] [place]  1. Left ventricular ejection fraction, by estimation, is 50 to 55%. The left ventricle has low normal function. The left ventricle has no regional wall motion abnormalities. There is moderate concentric left ventricular hypertrophy. Left ventricular diastolic parameters are consistent with Grade I diastolic dysfunction (impaired relaxation).  2. Right ventricular systolic function is normal. The right ventricular size is normal. There is severely elevated pulmonary artery systolic pressure. The estimated right ventricular systolic pressure is 25.3 mmHg.  3. Left atrial size was mildly dilated.  4. The mitral valve is degenerative. Trivial mitral valve regurgitation. Moderate mitral annular calcification.  5. The aortic valve is tricuspid. There is mild calcification of the aortic valve. There is mild thickening of the aortic valve. Aortic valve regurgitation is not visualized. Mild aortic valve stenosis.  6. The inferior vena cava is dilated in size with <50% respiratory variability, suggesting right atrial pressure of 15 mmHg. Comparison(s): No prior Echocardiogram. FINDINGS  Left Ventricle: Left ventricular ejection fraction, by estimation, is 50 to 55%. The left ventricle has low normal function. The left ventricle has no regional wall motion abnormalities. The left ventricular internal cavity size was normal in size. There is moderate concentric left ventricular hypertrophy. Left ventricular diastolic parameters are consistent with Grade I diastolic dysfunction (impaired relaxation). Right Ventricle: The right ventricular size is normal. Right vetricular wall thickness was not well visualized. Right ventricular systolic function is normal. There is severely elevated pulmonary artery systolic pressure. The tricuspid regurgitant velocity is 3.41 m/s, and with an assumed right atrial pressure of 15 mmHg, the  estimated right  ventricular systolic pressure is 95.6 mmHg. Left Atrium: Left atrial size was mildly dilated. Right Atrium: Right atrial size was normal in size. Pericardium: There is no evidence of pericardial effusion. Mitral Valve: The mitral valve is degenerative in appearance. There is moderate thickening of the mitral valve leaflet(s). There is moderate calcification of the mitral valve leaflet(s). Moderate mitral annular calcification. Trivial mitral valve regurgitation. Tricuspid Valve: The tricuspid valve is normal in structure. Tricuspid valve regurgitation is mild. Aortic Valve: The aortic valve is tricuspid. There is mild calcification of the aortic valve. There is mild thickening of the aortic valve. Aortic valve regurgitation is not visualized. Mild aortic stenosis is present. Aortic valve mean gradient measures  9.0 mmHg. Aortic valve peak gradient measures 17.5 mmHg. Aortic valve area, by VTI measures 1.07 cm. Pulmonic Valve: The pulmonic valve was not well visualized. Pulmonic valve regurgitation is trivial. Aorta: The aortic root and ascending aorta are structurally normal, with no evidence of dilitation. Venous: The inferior vena cava is dilated in size with less than 50% respiratory variability, suggesting right atrial pressure of 15 mmHg. IAS/Shunts: No atrial level shunt detected by color flow Doppler.  LEFT VENTRICLE PLAX 2D LVIDd:         3.49 cm     Diastology LVIDs:         2.70 cm     LV e' medial:    4.79 cm/s LV PW:         1.20 cm     LV E/e' medial:  18.9 LV IVS:        1.29 cm     LV e' lateral:   7.51 cm/s LVOT diam:     1.70 cm     LV E/e' lateral: 12.1 LV SV:         47 LV SV Index:   24 LVOT Area:     2.27 cm  LV Volumes (MOD) LV vol d, MOD A4C: 60.4 ml LV vol s, MOD A4C: 28.4 ml LV SV MOD A4C:     60.4 ml RIGHT VENTRICLE             IVC RV S prime:     17.00 cm/s  IVC diam: 2.20 cm TAPSE (M-mode): 2.5 cm LEFT ATRIUM             Index        RIGHT ATRIUM          Index LA diam:         4.00 cm 2.04 cm/m   RA Area:     9.67 cm LA Vol (A2C):   59.5 ml 30.38 ml/m  RA Volume:   19.10 ml 9.75 ml/m LA Vol (A4C):   59.6 ml 30.43 ml/m LA Biplane Vol: 61.7 ml 31.50 ml/m  AORTIC VALVE AV Area (Vmax):    1.06 cm AV Area (Vmean):   1.03 cm AV Area (VTI):     1.07 cm AV Vmax:           209.00 cm/s AV Vmean:          141.000 cm/s AV VTI:            0.435 m AV Peak Grad:      17.5 mmHg AV Mean Grad:      9.0 mmHg LVOT Vmax:         97.20 cm/s LVOT Vmean:        64.250 cm/s LVOT VTI:  0.206 m LVOT/AV VTI ratio: 0.47  AORTA Ao Root diam: 2.70 cm Ao Asc diam:  3.20 cm MITRAL VALVE                TRICUSPID VALVE MV Area (PHT): 3.21 cm     TR Peak grad:   46.5 mmHg MV Decel Time: 236 msec     TR Vmax:        341.00 cm/s MV E velocity: 90.70 cm/s MV A velocity: 131.00 cm/s  SHUNTS MV E/A ratio:  0.69         Systemic VTI:  0.21 m                             Systemic Diam: 1.70 cm Gwyndolyn Kaufman MD Electronically signed by Gwyndolyn Kaufman MD Signature Date/Time: 07/04/2021/4:16:30 PM    Final    VAS US CAROTID (at [facility] and [facility] only)  Result Date: 07/09/2021 Carotid Arterial Duplex Study Patient Name:  MAGUADALUPE LATA  Date of Exam:   07/09/2021 Medical Rec #: [id number]      Accession #:    [id number] Date of Birth: [birth year]      Patient Gender: F Patient Age:   72 years Exam Location:  [facility] Procedure:      VAS US CAROTID Referring Phys: Amie [place] --------------------------------------------------------------------------------  Indications:       CVA. Risk Factors:      None. Limitations        Today's exam was limited due to the patient's respiratory                    variation and patient positioning, patient anatomy. Comparison Study:  No prior studies. Performing Technologist: Oliver Hum RVT  Examination Guidelines: A complete evaluation includes B-mode imaging, spectral Doppler, color Doppler, and power Doppler as needed of all accessible portions of each vessel.  Bilateral testing is considered an integral part of a complete examination. Limited examinations for reoccurring indications may be performed as noted.  Right Carotid Findings: +----------+--------+--------+--------+-----------------------+--------+             PSV cm/s EDV cm/s Stenosis Plaque Description      Comments  +----------+--------+--------+--------+-----------------------+--------+  CCA Prox   120      14                smooth and heterogenous           +----------+--------+--------+--------+-----------------------+--------+  CCA Distal 111      16                smooth and heterogenous           +----------+--------+--------+--------+-----------------------+--------+  ICA Prox   260      52       40-59%   calcific                          +----------+--------+--------+--------+-----------------------+--------+  ICA Mid    178      31                smooth and heterogenous           +----------+--------+--------+--------+-----------------------+--------+  ICA Distal 101      26  tortuous  +----------+--------+--------+--------+-----------------------+--------+  ECA        258      25                                                  +----------+--------+--------+--------+-----------------------+--------+ +----------+--------+-------+--------+-------------------+             PSV cm/s EDV cms Describe Arm Pressure (mmHG)  +----------+--------+-------+--------+-------------------+  Subclavian 113                                            +----------+--------+-------+--------+-------------------+ +---------+--------+--+--------+-+---------+  Vertebral PSV cm/s 61 EDV cm/s 7 Antegrade  +---------+--------+--+--------+-+---------+  Left Carotid Findings: +----------+--------+--------+--------+-----------------------+--------+             PSV cm/s EDV cm/s Stenosis Plaque Description      Comments  +----------+--------+--------+--------+-----------------------+--------+  CCA  Prox   69       7                 smooth and heterogenous           +----------+--------+--------+--------+-----------------------+--------+  CCA Distal 75       10                smooth and heterogenous           +----------+--------+--------+--------+-----------------------+--------+  ICA Prox   68       14                smooth and heterogenous           +----------+--------+--------+--------+-----------------------+--------+  ICA Distal 62       17                                        tortuous  +----------+--------+--------+--------+-----------------------+--------+  ECA        148      10                                                  +----------+--------+--------+--------+-----------------------+--------+ +----------+--------+--------+--------+-------------------+             PSV cm/s EDV cm/s Describe Arm Pressure (mmHG)  +----------+--------+--------+--------+-------------------+  Subclavian 154                                             +----------+--------+--------+--------+-------------------+ +---------+--------+---+--------+--+---------+  Vertebral PSV cm/s 151 EDV cm/s 29 Antegrade  +---------+--------+---+--------+--+---------+   Summary: Right Carotid: Velocities in the right ICA are consistent with a 40-59%                stenosis. Left Carotid: Velocities in the left ICA are consistent with a 1-39% stenosis. Vertebrals: Bilateral vertebral arteries demonstrate antegrade flow. *See table(s) above for measurements and observations.  Electronically signed by Deitra Mayo MD on 07/09/2021 at 4:00:38 PM.    Final    VAS [place] LOWER EXTREMITY VENOUS (DVT)  Result Date: 07/09/2021  Lower  Venous DVT Study Patient Name:  MIRIAN CASCO  Date of Exam:   07/09/2021 Medical Rec #: [id number]      Accession #:    [id number] Date of Birth: Jan 28, 1933      Patient Gender: F Patient Age:   52 years Exam Location:  [facility] Procedure:      VAS [place] LOWER EXTREMITY VENOUS (DVT) Referring Phys:  Cornelius Moras XU --------------------------------------------------------------------------------  Indications: Stroke.  Risk Factors: None identified. Limitations: Poor ultrasound/tissue interface and patient positioning. Comparison Study: No prior studies. Performing Technologist: Oliver Hum RVT  Examination Guidelines: A complete evaluation includes B-mode imaging, spectral Doppler, color Doppler, and power Doppler as needed of all accessible portions of each vessel. Bilateral testing is considered an integral part of a complete examination. Limited examinations for reoccurring indications may be performed as noted. The reflux portion of the exam is performed with the patient in reverse Trendelenburg.  +---------+---------------+---------+-----------+----------+--------------+  RIGHT     Compressibility Phasicity Spontaneity Properties Thrombus Aging  +---------+---------------+---------+-----------+----------+--------------+  CFV       Full            Yes       Yes                                    +---------+---------------+---------+-----------+----------+--------------+  SFJ       Full                                                             +---------+---------------+---------+-----------+----------+--------------+  FV Prox   Full                                                             +---------+---------------+---------+-----------+----------+--------------+  FV Mid    Full                                                             +---------+---------------+---------+-----------+----------+--------------+  FV Distal Full                                                             +---------+---------------+---------+-----------+----------+--------------+  PFV       Full                                                             +---------+---------------+---------+-----------+----------+--------------+  POP       Full            Yes  Yes                                     +---------+---------------+---------+-----------+----------+--------------+  PTV       Full                                                             +---------+---------------+---------+-----------+----------+--------------+  PERO      Full                                                             +---------+---------------+---------+-----------+----------+--------------+   +---------+---------------+---------+-----------+----------+--------------+  LEFT      Compressibility Phasicity Spontaneity Properties Thrombus Aging  +---------+---------------+---------+-----------+----------+--------------+  CFV       Full            Yes       Yes                                    +---------+---------------+---------+-----------+----------+--------------+  SFJ       Full                                                             +---------+---------------+---------+-----------+----------+--------------+  FV Prox   Full                                                             +---------+---------------+---------+-----------+----------+--------------+  FV Mid    Full            Yes       Yes                                    +---------+---------------+---------+-----------+----------+--------------+  FV Distal Full                                                             +---------+---------------+---------+-----------+----------+--------------+  PFV       Full                                                             +---------+---------------+---------+-----------+----------+--------------+  POP  Full            Yes       Yes                                    +---------+---------------+---------+-----------+----------+--------------+  PTV       Full                                                             +---------+---------------+---------+-----------+----------+--------------+  PERO      Full                                                              +---------+---------------+---------+-----------+----------+--------------+     Summary: RIGHT: - There is no evidence of deep vein thrombosis in the lower extremity. However, portions of this examination were limited- see technologist comments above.  - No cystic structure found in the popliteal fossa.  LEFT: - There is no evidence of deep vein thrombosis in the lower extremity. However, portions of this examination were limited- see technologist comments above.  - No cystic structure found in the popliteal fossa.  *See table(s) above for measurements and observations. Electronically signed by Deitra Mayo MD on 07/09/2021 at 4:00:19 PM.    Final     Labs:  Basic Metabolic Panel: Recent Labs  Lab 07/21/21 1000 07/23/21 1622  NA 138 138  K 4.4 3.3*  CL 103 99  CO2 25 28  GLUCOSE 137* 189*  BUN 51* 24*  CREATININE 8.86* 4.95*  CALCIUM 8.8* 9.1  PHOS 4.6 2.5    CBC: Recent Labs  Lab 07/21/21 1000 07/23/21 1622  WBC 7.9 7.4  HGB 8.8* 10.5*  HCT 28.4* 32.1*  MCV 91.6 90.2  PLT 342 305    CBG: No results for input(s): GLUCAP in the last 168 hours.  Brief HPI:   Nachelle Negrette is a 87 y.o. female who has a history of end-stage renal disease and was preparing to go to hemodialysis on the morning of July 04, 2021.  She developed shortness of breath and collapse.  Emergency medical services arrived and she received 15 minutes of CPR.  ROSC obtained.  A King airway was placed in the field and was exchanged for an endotracheal tube at [facility] emergency department.  Work-up included CTA of the chest which showed no PE but multiple bilateral rib fractures.  Imaging revealed right-sided airspace disease suggesting pneumonia and she was started on Rocephin and azithromycin.  She was extubated the following day.  On 12/11 she exhibited delirium and sundowning.  On 12/12 rapid response was called due to confusion and expressive aphasia.  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 An MRI of the brain was performed which showed multiple infarcts suspected to be due to micro emboli during resuscitation.  Carotid arterial duplex revealed right ICA stenosis 40 to 59%.  Neurology was consulted.  Cardiology also was consulted and arrangements for placement of loop recorder were considered.  She was not a  candidate so arrangements for 30-day event monitor as outpatient were arranged.  Nephrology was consulted for history of chronic intermittent hemodialysis.  She was treated for pneumonia with ceftriaxone and therapy was completed on 15.  Hospital Course: Sibel Strausbaugh was admitted to rehab 07/16/2021 for inpatient therapies to consist of PT, ST and OT at least three hours five days a week. Past admission physiatrist, therapy team and rehab RN have worked together to provide customized collaborative inpatient rehab.  She continued to make progress and attention was turned to alleviating pain from rib fractures.  She did not tolerate tramadol as it caused anxiety and restlessness.  Tylenol 3 given as needed.  Hemodialysis treatments were uneventful.  She was tolerating her diet.  She will continue Plavix and aspirin daily for 3 months then aspirin alone.   Blood pressures were monitored on TID basis and Coreg, nifedipine and clonidine were continued.  Diabetes has been monitored with ac/hs CBG checks and SSI was use prn for tighter BS control.    Rehab course: During patient's stay in rehab weekly team conferences were held to monitor patient's progress, set goals and discuss barriers to discharge. At admission, patient required min assist for ambulation, transfers.  Moderate assist for ADLs.  She  has had improvement in activity tolerance, balance, postural control as well as ability to compensate for deficits. She has had improvement in functional use RUE/LUE  and RLE/LLE as well as improvement in awareness.  She made good progress and improved her  abilities with language for word finding errors and completed cognitive tasks.  She was able to complete all BADLs and functional transfers using rolling walker with supervision.  She was discharged at overall supervision level.       Disposition: Discharge disposition: 01-Home or Self Care        Diet: Renal, carb modified  Special Instructions:  No driving, alcohol consumption or tobacco use.   Follow-up bilateral carotid artery duplex in 1 year  30-35 minutes were spent on discharge planning and discharge summary.   Discharge Instructions     Ambulatory referral to Physical Medicine Rehab   Complete by: As directed    Discharge patient   Complete by: As directed    Discharge disposition: 01-Home or Self Care   Discharge patient date: 07/26/2021      Allergies as of 07/26/2021       Reactions   Penicillins Rash   Prednisone Other (See Comments)   insomnia insomnia insomnia   Penicillins    Scallops [shellfish Allergy]    Threw up blood   Shellfish Allergy    Tramadol Anxiety        Medication List     STOP taking these medications    feeding supplement (NEPRO CARB STEADY) Liqd   HYDROmorphone 2 MG tablet Commonly known as: DILAUDID   ibuprofen 200 MG tablet Commonly known as: ADVIL       TAKE these medications    acetaminophen 500 MG tablet Commonly known as: TYLENOL Take 2 tablets (1,000 mg total) by mouth 3 (three) times daily. For 3 to 5 days and then can consider changing it to as needed based on improvement of her musculoskeletal chest pain related to rib fractures.   acetaminophen-codeine 300-30 MG tablet Commonly known as: TYLENOL #3 Take 1 tablet by mouth every 4 (four) hours as needed for moderate pain.   Advair HFA 115-21 MCG/ACT inhaler Generic drug: fluticasone-salmeterol Inhale 1 puff into the lungs 2 (two) times daily.  albuterol 108 (90 Base) MCG/ACT inhaler Commonly known as: VENTOLIN HFA Inhale 2 puffs into the  lungs every 4 (four) hours as needed for shortness of breath. What changed: Another medication with the same name was removed. Continue taking this medication, and follow the directions you see here.   allopurinol 100 MG tablet Commonly known as: ZYLOPRIM Take 100 mg by mouth daily.   aspirin 325 MG EC tablet Take 1 tablet (325 mg total) by mouth daily.   carvedilol 25 MG tablet Commonly known as: COREG Take 1 tablet (25 mg total) by mouth See admin instructions. Take 25 mg twice daily on nondailysis days (Sun, Mon, Wed, and Fri) What changed: Another medication with the same name was added. Make sure you understand how and when to take each.   carvedilol 25 MG tablet Commonly known as: COREG Take 1 tablet (25 mg total) by mouth 2 (two) times daily with a meal. What changed: You were already taking a medication with the same name, and this prescription was added. Make sure you understand how and when to take each.   cinacalcet 30 MG tablet Commonly known as: SENSIPAR Take 1 tablet (30 mg total) by mouth Every Tuesday,Thursday,and Saturday with dialysis. Start taking on: July 27, 2021   cloNIDine 0.1 MG tablet Commonly known as: CATAPRES Take 1 tablet (0.1 mg total) by mouth 2 (two) times daily.   clopidogrel 75 MG tablet Commonly known as: PLAVIX Take 1 tablet (75 mg total) by mouth daily. What changed: Another medication with the same name was added. Make sure you understand how and when to take each.   clopidogrel 75 MG tablet Commonly known as: PLAVIX Take 1 tablet (75 mg total) by mouth daily. Start taking on: July 27, 2021 What changed: You were already taking a medication with the same name, and this prescription was added. Make sure you understand how and when to take each.   Darbepoetin Alfa 60 MCG/0.3ML Sosy injection Commonly known as: ARANESP Inject 0.3 mLs (60 mcg total) into the skin every Thursday at 6pm. Start taking on: August 01, 2021   docusate  sodium 100 MG capsule Commonly known as: COLACE Take 1 capsule (100 mg total) by mouth 2 (two) times daily.   doxercalciferol 4 MCG/2ML injection Commonly known as: HECTOROL Inject 2.5 mLs (5 mcg total) into the vein Every Tuesday,Thursday,and Saturday with dialysis. Start taking on: July 27, 2021   ferric citrate 1 GM 210 MG(Fe) tablet Commonly known as: AURYXIA Take 1 tablet (210 mg total) by mouth 3 (three) times daily with meals.   lidocaine 5 % Commonly known as: LIDODERM Place 2 patches onto the skin daily. Remove & Discard patch within 12 hours or as directed by MD   lidocaine-prilocaine cream Commonly known as: EMLA Apply 1 application topically 3 (three) times a week.   multivitamin Tabs tablet Take 1 tablet by mouth at bedtime.   Muscle Rub 10-15 % Crea Apply 1 application topically as needed for muscle pain.   NIFEdipine 30 MG 24 hr tablet Commonly known as: PROCARDIA-XL/NIFEDICAL-XL Take 2 tablets (60 mg total) by mouth at bedtime.   omeprazole 20 MG capsule Commonly known as: PRILOSEC Take 2 capsules (40 mg total) by mouth daily.   polyethylene glycol 17 g packet Commonly known as: MIRALAX / GLYCOLAX Take 17 g by mouth daily.   rOPINIRole 0.5 MG tablet Commonly known as: REQUIP Take 0.5 mg by mouth at bedtime.   Senna 8.6 MG Caps Take 1 tablet  by mouth daily.   simvastatin 20 MG tablet Commonly known as: ZOCOR Take 20 mg by mouth at bedtime.   SYSTANE OP Place 1 drop into both eyes daily as needed (dry eyes).   traZODone 50 MG tablet Commonly known as: DESYREL Take 1 tablet (50 mg total) by mouth at bedtime as needed for sleep.        Follow-up Information     [facility]. Call in 4 week(s).   Why: Call on Monday for four week follow-up appointment post hospitalization Contact information: [address]     [address] [place] [address] [phone]        Courtney Heys, MD Follow up.    Specialty: Physical Medicine and Rehabilitation Why: office will call you to arrange your appt (sent) Contact information: [address] [address] [phone]                 Signed: Barbie Banner 07/26/2021, 2:30 PM

## 2021-07-27 ENCOUNTER — Telehealth: Payer: Self-pay | Admitting: Physician Assistant

## 2021-07-27 NOTE — Telephone Encounter (Signed)
Transition of care contact from inpatient facility  Date of Discharge: 07/26/21 Date of Contact: 07/27/21 Method of contact: Phone  Attempted to contact patient to discuss transition of care from inpatient admission. Patient did not answer the phone. Message was left on the patient's voicemail with call back number instructions.  Anice Paganini, PA-C 07/27/2021, 11:23 AM  [employer]

## 2021-07-31 ENCOUNTER — Telehealth: Payer: Self-pay

## 2021-07-31 NOTE — Telephone Encounter (Signed)
Transitional Care Call--who you spoke with  Patient & Daughter Stacey Dorsey   Are you/is patient experiencing any problems since coming home? No. Are there any questions regarding any aspect of care? None. Are there any questions regarding medications administration/dosing? Yes. Are meds being taken as prescribed?Yes. Patient should review meds with caller to confirm. Yes & confirmed. Have there been any falls? No falls. Has Home Health been to the house and/or have they contacted you?Yes.  If not, have you tried to contact them? No need.  Can we help you contact them? N/a Are bowels and bladder emptying properly? Yes. Are there any unexpected incontinence issues? No. If applicable, is patient following bowel/bladder programs? N/a Any fevers, problems with breathing, unexpected pain?None Are there any skin problems or new areas of breakdown? Has the patient/family member arranged specialty MD follow up (ie cardiology/neurology/renal/surgical/etc)? Appointments scheduled. Can we help arrange?Follow up phone numbers given. Does the patient need any other services or support that we can help arrange? No need. Are caregivers following through as expected in assisting the patient?Yes.         11. Has the patient quit smoking, drinking alcohol, or using drugs as recommended? Advised.   Appointment Date/Time/ Arrival time/ and who they are seeing Danella Sensing NP 08/07/2021 at 11:20am [place]

## 2021-07-31 NOTE — Telephone Encounter (Signed)
Patient aware of Dr. Lovorn's reply. 

## 2021-07-31 NOTE — Telephone Encounter (Signed)
Stacey Dorsey was recently discharged from [name]. Dx Stroke. Patient is requesting a liquid nutritional supplement safe for dialysis patients. If prescribed please send to the local pharmacy in the chart.  Call back phone [phone].

## 2021-08-07 ENCOUNTER — Encounter: Payer: Medicare Other | Admitting: Registered Nurse

## 2021-08-31 ENCOUNTER — Other Ambulatory Visit: Payer: Self-pay | Admitting: Physician Assistant

## 2021-09-01 ENCOUNTER — Other Ambulatory Visit: Payer: Self-pay | Admitting: Physician Assistant

## 2021-09-12 ENCOUNTER — Emergency Department (HOSPITAL_COMMUNITY)
Admission: EM | Admit: 2021-09-12 | Discharge: 2021-09-12 | Disposition: A | Discharge status: Home / Self Care | Payer: Medicare Other | Attending: Emergency Medicine | Admitting: Emergency Medicine

## 2021-09-12 ENCOUNTER — Encounter (HOSPITAL_COMMUNITY): Payer: Self-pay

## 2021-09-12 ENCOUNTER — Emergency Department (HOSPITAL_COMMUNITY): Payer: Medicare Other

## 2021-09-12 DIAGNOSIS — R4182 Altered mental status, unspecified: Secondary | ICD-10-CM | POA: Insufficient documentation

## 2021-09-12 DIAGNOSIS — Z79899 Other long term (current) drug therapy: Secondary | ICD-10-CM | POA: Insufficient documentation

## 2021-09-12 DIAGNOSIS — Y900 Blood alcohol level of less than 20 mg/100 ml: Secondary | ICD-10-CM | POA: Diagnosis not present

## 2021-09-12 DIAGNOSIS — N186 End stage renal disease: Secondary | ICD-10-CM | POA: Diagnosis not present

## 2021-09-12 DIAGNOSIS — R7989 Other specified abnormal findings of blood chemistry: Secondary | ICD-10-CM | POA: Diagnosis not present

## 2021-09-12 DIAGNOSIS — E1122 Type 2 diabetes mellitus with diabetic chronic kidney disease: Secondary | ICD-10-CM | POA: Diagnosis not present

## 2021-09-12 DIAGNOSIS — Z7982 Long term (current) use of aspirin: Secondary | ICD-10-CM | POA: Insufficient documentation

## 2021-09-12 DIAGNOSIS — J449 Chronic obstructive pulmonary disease, unspecified: Secondary | ICD-10-CM | POA: Insufficient documentation

## 2021-09-12 DIAGNOSIS — I120 Hypertensive chronic kidney disease with stage 5 chronic kidney disease or end stage renal disease: Secondary | ICD-10-CM | POA: Insufficient documentation

## 2021-09-12 DIAGNOSIS — R404 Transient alteration of awareness: Secondary | ICD-10-CM

## 2021-09-12 LAB — CBC WITH DIFFERENTIAL/PLATELET
Abs Immature Granulocytes: 0.02 10*3/uL (ref 0.00–0.07)
Basophils Absolute: 0 10*3/uL (ref 0.0–0.1)
Basophils Relative: 1 %
Eosinophils Absolute: 0.2 10*3/uL (ref 0.0–0.5)
Eosinophils Relative: 3 %
HCT: 29.3 % — ABNORMAL LOW (ref 36.0–46.0)
Hemoglobin: 9.4 g/dL — ABNORMAL LOW (ref 12.0–15.0)
Immature Granulocytes: 0 %
Lymphocytes Relative: 28 %
Lymphs Abs: 2 10*3/uL (ref 0.7–4.0)
MCH: 30 pg (ref 26.0–34.0)
MCHC: 32.1 g/dL (ref 30.0–36.0)
MCV: 93.6 fL (ref 80.0–100.0)
Monocytes Absolute: 1 10*3/uL (ref 0.1–1.0)
Monocytes Relative: 15 %
Neutro Abs: 3.8 10*3/uL (ref 1.7–7.7)
Neutrophils Relative %: 53 %
Platelets: 208 10*3/uL (ref 150–400)
RBC: 3.13 MIL/uL — ABNORMAL LOW (ref 3.87–5.11)
RDW: 15 % (ref 11.5–15.5)
WBC: 7.1 10*3/uL (ref 4.0–10.5)
nRBC: 0 % (ref 0.0–0.2)

## 2021-09-12 LAB — COMPREHENSIVE METABOLIC PANEL
ALT: 8 U/L (ref 0–44)
AST: 23 U/L (ref 15–41)
Albumin: 3.4 g/dL — ABNORMAL LOW (ref 3.5–5.0)
Alkaline Phosphatase: 76 U/L (ref 38–126)
Anion gap: 13 (ref 5–15)
BUN: 22 mg/dL (ref 8–23)
CO2: 29 mmol/L (ref 22–32)
Calcium: 8.3 mg/dL — ABNORMAL LOW (ref 8.9–10.3)
Chloride: 96 mmol/L — ABNORMAL LOW (ref 98–111)
Creatinine, Ser: 5.13 mg/dL — ABNORMAL HIGH (ref 0.44–1.00)
GFR, Estimated: 8 mL/min — ABNORMAL LOW (ref >60)
Glucose, Bld: 102 mg/dL — ABNORMAL HIGH (ref 70–99)
Potassium: 3.8 mmol/L (ref 3.5–5.1)
Sodium: 138 mmol/L (ref 135–145)
Total Bilirubin: 0.7 mg/dL (ref 0.3–1.2)
Total Protein: 6.7 g/dL (ref 6.5–8.1)

## 2021-09-12 LAB — ETHANOL: Alcohol, Ethyl (B): <10 mg/dL (ref <10)

## 2021-09-12 LAB — CBG MONITORING, ED: Glucose-Capillary: 101 mg/dL — ABNORMAL HIGH (ref 70–99)

## 2021-09-12 IMAGING — CT CT HEAD W/O CM
3 series · 15 of 47 positions shown, 18 images · non-contrast
Comparison: [DATE]

CLINICAL DATA: Mental status change of unknown cause. Dialysis
patient.



[Series 3: head 5.0 h30s · axial · 0.44mm/px · z∈[-126,+9]mm · 9 of 33 slices shown, 12 images]
[im 3/33  brain]
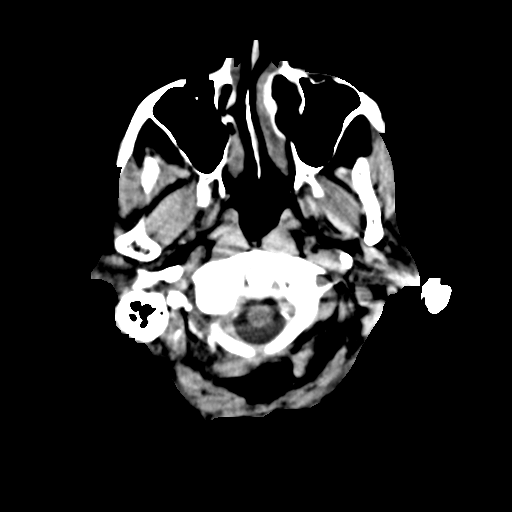
[im 3/33  bone]
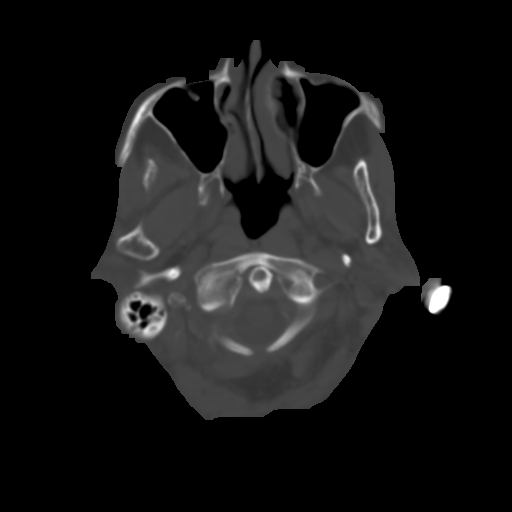
[im 6/33  brain]
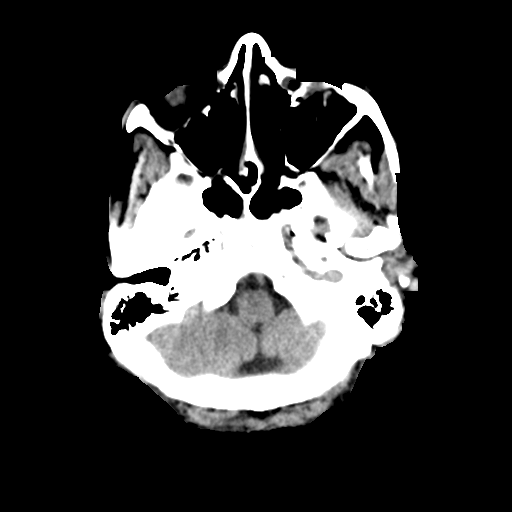
[im 9/33  brain]
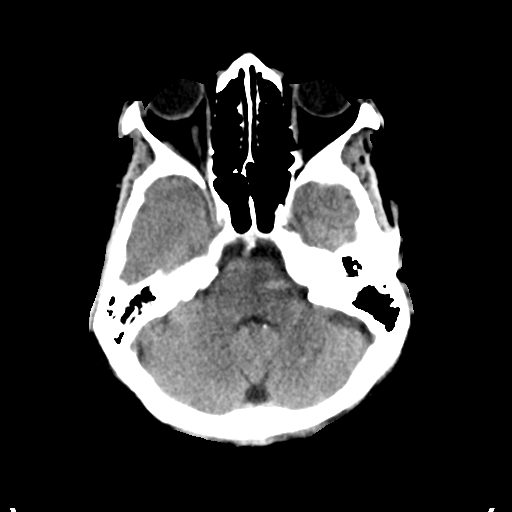
[im 13/33  brain]
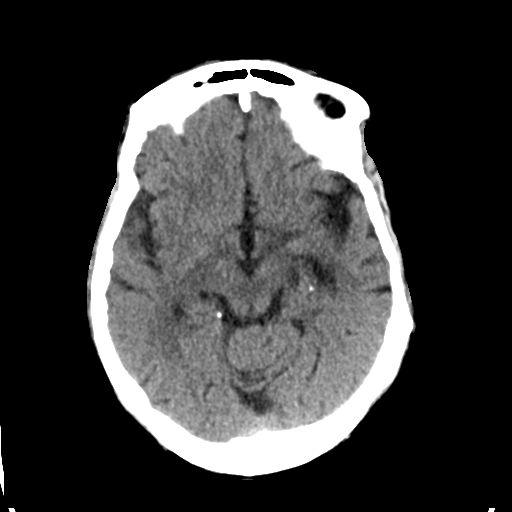
[im 17/33  brain]
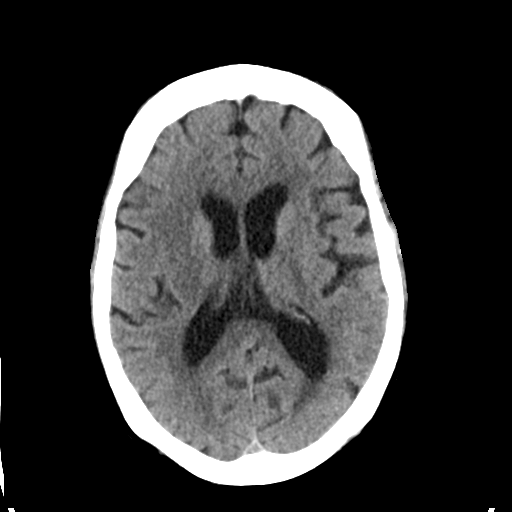
[im 17/33  bone]
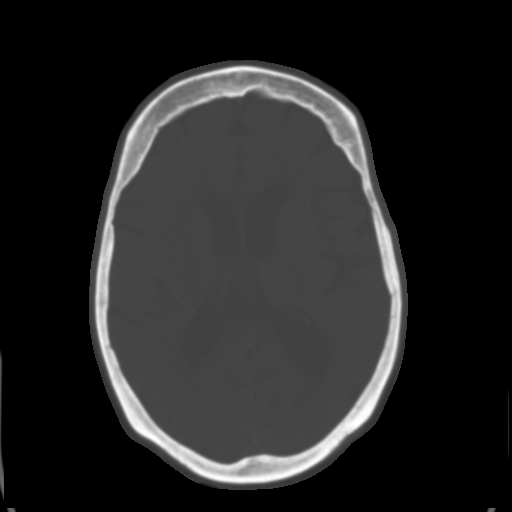
[im 20/33  brain]
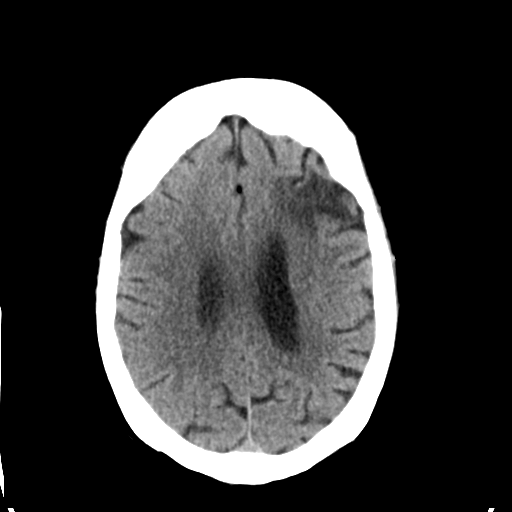
[im 24/33  brain]
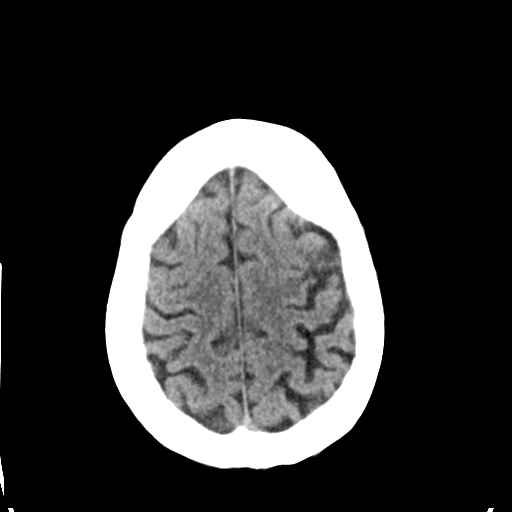
[im 27/33  brain]
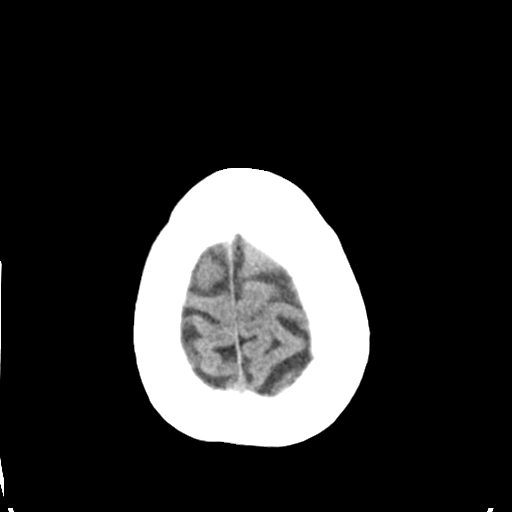
[im 30/33  brain]
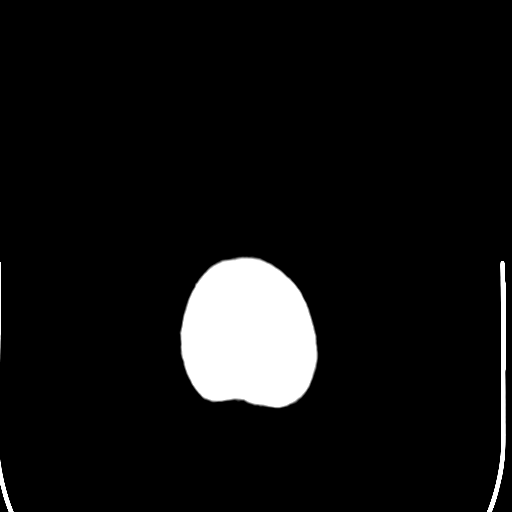
[im 30/33  bone]
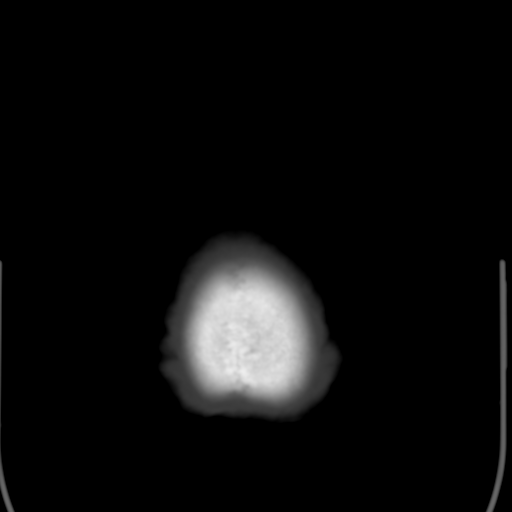

[Series 5: head 3.0 mpr cor · coronal · 0.32mm/px · 3 of 70 slices shown]
[im 24/70  brain]
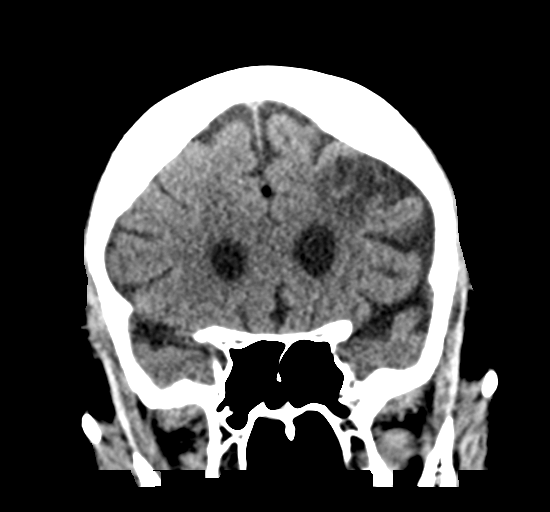
[im 31/70  brain]
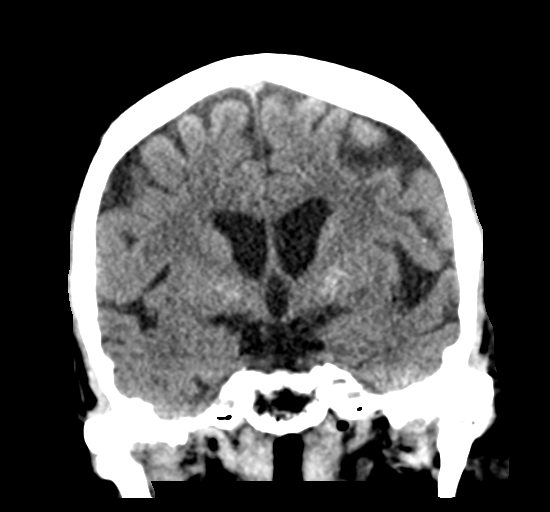
[im 39/70  brain]
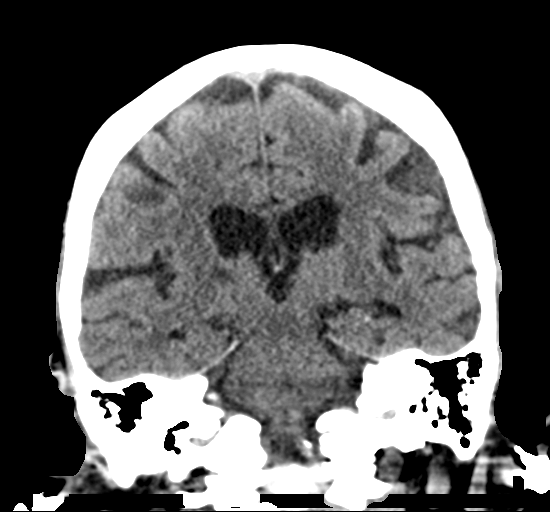

[Series 6: head 3.0 mpr sag · sagittal · 0.32mm/px · 3 of 60 slices shown]
[im 20/60  brain]
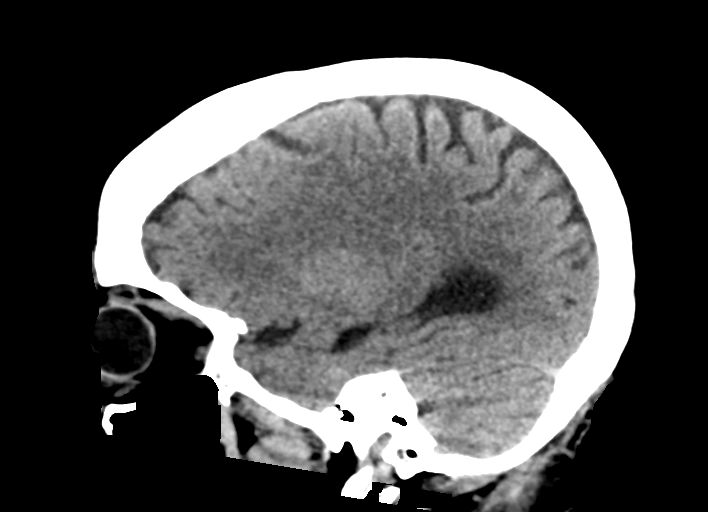
[im 30/60  brain]
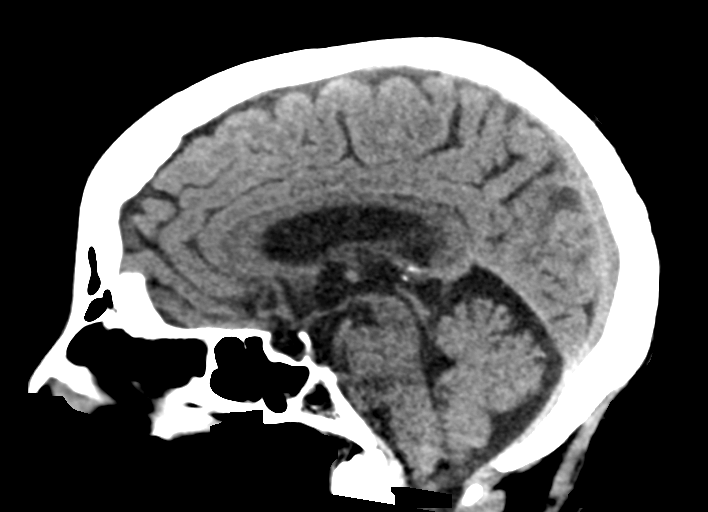
[im 40/60  brain]
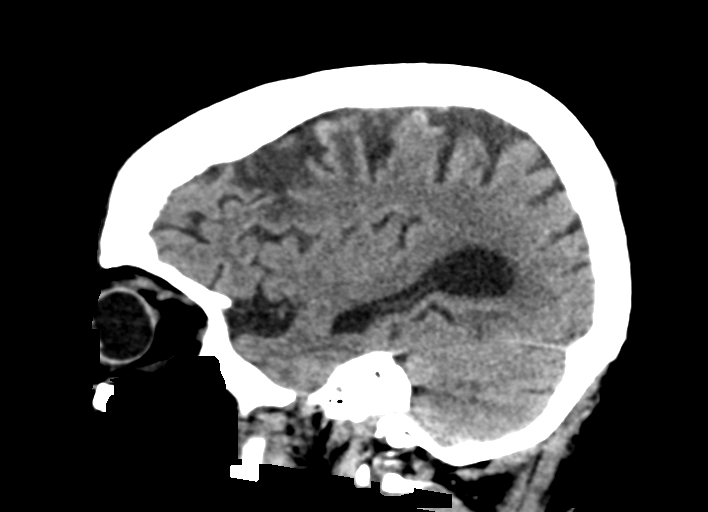

[15 of 47 positions shown; findings below may reference images not displayed]

FINDINGS: Brain: Chronic small-vessel ischemic changes affect the pons and
cerebellum. Cerebral hemispheres show an old infarction of the right
lateral thalamus and chronic small-vessel ischemic changes of the
white matter. There is an old left frontal cortical and subcortical
infarction. No mass lesion, hemorrhage, hydrocephalus or extra-axial
collection.

Vascular: There is atherosclerotic calcification of the major
vessels at the base of the brain.

Skull: Negative

Sinuses/Orbits: Clear/normal

Other: None
IMPRESSION: No acute CT finding. Chronic small-vessel ischemic changes of the
pons, cerebellum and hemispheric white matter. Old left frontal
cortical and subcortical infarction.

## 2021-09-12 NOTE — ED Notes (Signed)
All discharge instructions and follow up care reviewed with patient and patient's family member at bedside. Both verbalized understanding. Patient stable at time of discharge and wheel chair provided to patient upon discharge.

## 2021-09-12 NOTE — ED Triage Notes (Signed)
Pt arrived via [facility] from dialysis 2 out of 3.5 hours completed. Dialysis was stopped d/t pt with approx 5 min episode of AMS (per staff pt is very talkative but began staring off and hyperventilating). Pt c/o trouble swallowing yesterday. Pt denies pain. Pt stated she was dizzy prior to EMS arrival but denies at present. EMS further reports pt has been caox4 since being in EMS care and stopped hyperventilating after EMS was able to calm her down and she only c/o tingling in hands throughout transport. Ems further reports that pt explained that she has recently talked with her PCP about taking meds prior to dialysis d/t feeling very anxious at dialysis and was prescribed zoloft.   EMS VS BP 200/70 initial 180/70 last  HR 76-80 SpO2 99% on 2L (admin dialysis, no home O2) CBG 129

## 2021-09-12 NOTE — Discharge Instructions (Signed)
You came to the emergency department today to be evaluated for your episode of altered mental status while at dialysis.  Your lab work and head CT scan were reassuring.    The way the event was described patient had like he may have had a seizure.  Due to this you will need to follow-up with [facility] neurology in the outpatient setting.  They should call you in 2-3 business days to schedule a follow-up appointment.  If you do not hear back from them please call to schedule a follow-up appointment.  Please do not drive or operate heavy machinery until you can be cleared by neurology.  Get help right away if you: Feel that you are not able to care for yourself. Develop severe headaches, repeated vomiting, seizures, blackouts, or slurred speech. Have increasing confusion, weakness, numbness, restlessness, or personality changes. Develop a loss of balance, have marked dizziness, feel uncoordinated, or fall. Develop severe anxiety, or you have delusions or hallucinations.

## 2021-09-12 NOTE — ED Provider Notes (Signed)
[facility]PART[facility] Provider Note   CSN: [id number] Arrival date & time: 09/12/21  1434     History  Chief Complaint  Patient presents with   Altered Mental Status    Short episode approx 5 min    Stacey Dorsey is a 88 y.o. female end-stage renal disease on hemodialysis (T/T/S), COPD, diabetes mellitus, hypertension, CVA, previous cardiac arrest.  Presents emergency department with a chief complaint of altered mental status.  This episode of altered mental status occurred while patient was receiving dialysis.  Per EMS staff reported that episode lasted approximately 5 minutes.  During this time patient appeared very startled, staring off into space and she was not acting herself.  EMS reports that dialysis staff state that patient is normally very talkative and spry however during this period she was talking very slowly and was taking longer to respond than usual.  After this episode patient started hyperventilating and developed numbness to bilateral hands and feet.  EMS reports that patient was recently started on Zoloft 2 days prior for anxiety revolving around receiving dialysis.  Patient did not take her Zoloft medication earlier today.  Patient reports feeling strange during dialysis.  Cannot identify this feeling further.  She endorses full recollection of the events while at dialysis.  Denied feeling scared or anxious during that time.  Reports that she did have numbness to bilateral upper and lower hands when she was breathing rapidly.  She states that this has since resolved.  Patient reports that on Tuesday and Wednesday she was having difficulty swallowing.  No other associated numbness, weakness, facial asymmetry, or dysarthria during that time.    Patient denies any pain or discomfort.  No recent fever, chills, cough, abdominal pain, nausea, vomiting, diarrhea, chest pain, shortness of breath.   Altered Mental Status Presenting symptoms: no  confusion   Associated symptoms: no abdominal pain, no fever, no headaches, no light-headedness, no nausea, no rash, no seizures, no vomiting and no weakness       Home Medications Prior to Admission medications   Medication Sig Start Date End Date Taking? Authorizing Provider  acetaminophen (TYLENOL) 500 MG tablet Take 2 tablets (1,000 mg total) by mouth 3 (three) times daily. For 3 to 5 days and then can consider changing it to as needed based on improvement of her musculoskeletal chest pain related to rib fractures. 07/16/21   Hongalgi, Lenis Dickinson, MD  acetaminophen-codeine (TYLENOL #3) 300-30 MG tablet Take 1 tablet by mouth every 4 (four) hours as needed for moderate pain. 07/26/21   Setzer, Edman Circle, PA-C  albuterol (VENTOLIN HFA) 108 (90 Base) MCG/ACT inhaler Inhale 2 puffs into the lungs every 4 (four) hours as needed for shortness of breath. 04/02/20   [provider]  allopurinol (ZYLOPRIM) 100 MG tablet Take 100 mg by mouth daily. 01/18/21   [provider]  aspirin 325 MG EC tablet Take 1 tablet (325 mg total) by mouth daily. 07/26/21   Setzer, Edman Circle, PA-C  carvedilol (COREG) 25 MG tablet Take 1 tablet (25 mg total) by mouth See admin instructions. Take 25 mg twice daily on nondailysis days (Sun, Mon, Wed, and Fri) 07/26/21   Setzer, Edman Circle, PA-C  carvedilol (COREG) 25 MG tablet Take 1 tablet (25 mg total) by mouth 2 (two) times daily with a meal. 07/26/21   Setzer, Edman Circle, PA-C  cinacalcet (SENSIPAR) 30 MG tablet Take 1 tablet (30 mg total) by mouth Every Tuesday,Thursday,and Saturday with dialysis. 07/27/21  Setzer, Edman Circle, PA-C  cloNIDine (CATAPRES) 0.1 MG tablet Take 1 tablet (0.1 mg total) by mouth 2 (two) times daily. 07/26/21   Setzer, Edman Circle, PA-C  clopidogrel (PLAVIX) 75 MG tablet Take 1 tablet (75 mg total) by mouth daily. 07/26/21   Setzer, Edman Circle, PA-C  clopidogrel (PLAVIX) 75 MG tablet Take 1 tablet (75 mg total) by mouth daily. 07/27/21   Setzer,  Edman Circle, PA-C  Darbepoetin Alfa (ARANESP) 60 MCG/0.3ML SOSY injection Inject 0.3 mLs (60 mcg total) into the skin every Thursday at 6pm. 08/01/21   Setzer, Edman Circle, PA-C  docusate sodium (COLACE) 100 MG capsule Take 1 capsule (100 mg total) by mouth 2 (two) times daily. 07/16/21   Hongalgi, Lenis Dickinson, MD  doxercalciferol (HECTOROL) 4 MCG/2ML injection Inject 2.5 mLs (5 mcg total) into the vein Every Tuesday,Thursday,and Saturday with dialysis. 07/27/21   Setzer, Edman Circle, PA-C  ferric citrate (AURYXIA) 1 GM 210 MG(Fe) tablet Take 1 tablet (210 mg total) by mouth 3 (three) times daily with meals. 07/26/21   Setzer, Edman Circle, PA-C  fluticasone-salmeterol (ADVAIR HFA) 825-05 MCG/ACT inhaler Inhale 1 puff into the lungs 2 (two) times daily. 07/26/21   Setzer, Edman Circle, PA-C  lidocaine (LIDODERM) 5 % Place 2 patches onto the skin daily. Remove & Discard patch within 12 hours or as directed by MD 07/26/21   Vaughan Basta, Edman Circle, PA-C  lidocaine-prilocaine (EMLA) cream Apply 1 application topically 3 (three) times a week. 04/05/21   [provider]  Menthol-Methyl Salicylate (MUSCLE RUB) 10-15 % CREA Apply 1 application topically as needed for muscle pain.    [provider]  multivitamin (RENA-VIT) TABS tablet Take 1 tablet by mouth at bedtime. 07/26/21   Setzer, Edman Circle, PA-C  NIFEdipine (PROCARDIA-XL/NIFEDICAL-XL) 30 MG 24 hr tablet Take 2 tablets (60 mg total) by mouth at bedtime. 07/26/21   Setzer, Edman Circle, PA-C  omeprazole (PRILOSEC) 20 MG capsule Take 2 capsules (40 mg total) by mouth daily. 07/26/21   Setzer, Edman Circle, PA-C  Polyethyl Glycol-Propyl Glycol (SYSTANE OP) Place 1 drop into both eyes daily as needed (dry eyes).    [provider]  polyethylene glycol (MIRALAX / GLYCOLAX) 17 g packet Take 17 g by mouth daily. 07/26/21   Setzer, Edman Circle, PA-C  rOPINIRole (REQUIP) 0.5 MG tablet Take 0.5 mg by mouth at bedtime. 05/03/21   [provider]  Sennosides (SENNA) 8.6  MG CAPS Take 1 tablet by mouth daily.    [provider]  simvastatin (ZOCOR) 20 MG tablet Take 20 mg by mouth at bedtime. 05/03/21   [provider]  traZODone (DESYREL) 50 MG tablet TAKE 1 TABLET(50 MG) BY MOUTH AT BEDTIME AS NEEDED FOR SLEEP 09/02/21   Bayard Hugger, NP      Allergies    Penicillins, Prednisone, Penicillins, Scallops [shellfish allergy], Shellfish allergy, and Tramadol    Review of Systems   Review of Systems  Constitutional:  Negative for chills and fever.  HENT:  Positive for trouble swallowing.   Eyes:  Negative for visual disturbance.  Respiratory:  Negative for shortness of breath.   Cardiovascular:  Negative for chest pain.  Gastrointestinal:  Negative for abdominal pain, nausea and vomiting.  Musculoskeletal:  Negative for back pain and neck pain.  Skin:  Negative for color change and rash.  Neurological:  Positive for numbness. Negative for dizziness, tremors, seizures, syncope, facial asymmetry, speech difficulty, weakness, light-headedness and headaches.  Psychiatric/Behavioral:  Negative for confusion.  Physical Exam Updated Vital Signs BP (!) 196/66 (BP Location: Right Arm)    Pulse 78    Temp 98.6 F (37 C) (Oral)    Resp 17    SpO2 95%  Physical Exam Vitals and nursing note reviewed.  Constitutional:      General: She is not in acute distress.    Appearance: She is not ill-appearing, toxic-appearing or diaphoretic.  HENT:     Head: Normocephalic and atraumatic.  Eyes:     General: No scleral icterus.       Right eye: No discharge.        Left eye: No discharge.     Extraocular Movements: Extraocular movements intact.     Pupils: Pupils are equal, round, and reactive to light.  Cardiovascular:     Rate and Rhythm: Normal rate.  Pulmonary:     Effort: Pulmonary effort is normal. No tachypnea, bradypnea or respiratory distress.     Breath sounds: Normal breath sounds. No stridor.  Abdominal:     Palpations: Abdomen is  soft.     Tenderness: There is no abdominal tenderness.  Musculoskeletal:     Cervical back: Normal range of motion and neck supple. No rigidity.  Skin:    General: Skin is warm and dry.  Neurological:     General: No focal deficit present.     Mental Status: She is alert and oriented to person, place, and time.     GCS: GCS eye subscore is 4. GCS verbal subscore is 5. GCS motor subscore is 6.     Cranial Nerves: No cranial nerve deficit or facial asymmetry.     Sensory: Sensation is intact.     Motor: No weakness, tremor, seizure activity or pronator drift.     Coordination: Finger-Nose-Finger Test normal.     Comments: CN II-XII intact; performed in supine position, +5 strength to bilateral upper extremities, +5 strength to dorsiflexion and plantarflexion, patient able to lift both legs against gravity and hold each there without difficulty.  Sensation to light touch intact to bilateral upper and lower extremities    Psychiatric:        Behavior: Behavior is cooperative.    ED Results / Procedures / Treatments   Labs (all labs ordered are listed, but only abnormal results are displayed) Labs Reviewed  COMPREHENSIVE METABOLIC PANEL - Abnormal; Notable for the following components:      Result Value   Chloride 96 (*)    Glucose, Bld 102 (*)    Creatinine, Ser 5.13 (*)    Calcium 8.3 (*)    Albumin 3.4 (*)    GFR, Estimated 8 (*)    All other components within normal limits  CBC WITH DIFFERENTIAL/PLATELET - Abnormal; Notable for the following components:   RBC 3.13 (*)    Hemoglobin 9.4 (*)    HCT 29.3 (*)    All other components within normal limits  CBG MONITORING, ED - Abnormal; Notable for the following components:   Glucose-Capillary 101 (*)    All other components within normal limits  ETHANOL    EKG EKG Interpretation  Date/Time:  Thursday September 12 2021 14:35:40 EST Ventricular Rate:  74 PR Interval:  165 QRS Duration: 97 QT Interval:  414 QTC  Calculation: 460 R Axis:   45 Text Interpretation: Sinus rhythm No acute changes No significant change since last tracing Confirmed by Varney Biles ([id number]) on 09/12/2021 2:56:45 PM  Radiology CT HEAD WO CONTRAST (5MM)  Result Date: 09/12/2021  CLINICAL DATA:  Mental status change of unknown cause. Dialysis patient. EXAM: CT HEAD WITHOUT CONTRAST TECHNIQUE: Contiguous axial images were obtained from the base of the skull through the vertex without intravenous contrast. RADIATION DOSE REDUCTION: This exam was performed according to the departmental dose-optimization program which includes automated exposure control, adjustment of the mA and/or kV according to patient size and/or use of iterative reconstruction technique. COMPARISON:  07/07/2021 FINDINGS: Brain: Chronic small-vessel ischemic changes affect the pons and cerebellum. Cerebral hemispheres show an old infarction of the right lateral thalamus and chronic small-vessel ischemic changes of the white matter. There is an old left frontal cortical and subcortical infarction. No mass lesion, hemorrhage, hydrocephalus or extra-axial collection. Vascular: There is atherosclerotic calcification of the major vessels at the base of the brain. Skull: Negative Sinuses/Orbits: Clear/normal Other: None IMPRESSION: No acute CT finding. Chronic small-vessel ischemic changes of the pons, cerebellum and hemispheric white matter. Old left frontal cortical and subcortical infarction. Electronically Signed   By: Nelson Chimes M.D.   On: 09/12/2021 17:15    Procedures Procedures    Medications Ordered in ED Medications - No data to display  ED Course/ Medical Decision Making/ A&P                           Medical Decision Making Amount and/or Complexity of Data Reviewed Labs: ordered. Radiology: ordered.   Alert 88 year old female in no acute stress, nontoxic-appearing.  Patient is alert to person, place, and time.  Information was obtained from patient  and EMS.  Past medical records were reviewed including previous provider notes, labs, and imaging.  Patient has medical history as outlined in HPI which complicates her care.  Due to reports of staring off in space and altered mental status concern for possible seizure.  Will obtain CMP, CBC, CBG, ethanol, and CT head for further evaluation.  Patient does not have documented seizures previously.  MRI was considered however lower suspicion for TIA as neuro exam is reassuring and no reports of focal neurological deficits during her episode earlier today.  EKG was independently reviewed myself and shows sinus rhythm.  Noncontrast head CT shows no acute intracranial abnormality.  Lab work was independently reviewed by myself.  Pertinent findings include: -Hemoglobin 9.4, hematocrit 29.3; peers to be patient's baseline -Creatinine 5.3, this is elevated due to her end-stage renal disease -Potassium within normal limits -CBG 101 -Ethanol unremarkable  Reevaluation patient continues to have no focal neurological deficit.  Patient continues to be alert and oriented.  Patient requesting to leave the emergency department.  Due to reports of patient's episode of transient altered mental status and staring into space concern for possible seizure.  This would be first-time seizure for patient.  Will place ambulatory referral to neurology and have patient follow-up closely in outpatient setting.  Patient advised to refrain from driving or operating heavy machinery until she can be evaluated by neurology.  Discussed results, findings, treatment and follow up. Patient advised of return precautions. Patient verbalized understanding and agreed with plan.  Patient was discussed with and evaluated by Dr. Kathrynn Humble.          Final Clinical Impression(s) / ED Diagnoses Final diagnoses:  Transient alteration of awareness    Rx / DC Orders ED Discharge Orders          Ordered    Ambulatory referral to  Neurology       Comments: An appointment is requested in approximately:  1 week   09/12/21 1743              Dyann Ruddle 09/12/21 1816    Fredia Sorrow, MD 09/12/21 [phone]

## 2021-09-12 NOTE — ED Notes (Signed)
Patient transported to CT 

## 2021-09-16 ENCOUNTER — Emergency Department (HOSPITAL_COMMUNITY): Payer: Medicare Other

## 2021-09-16 ENCOUNTER — Other Ambulatory Visit: Payer: Self-pay

## 2021-09-16 ENCOUNTER — Emergency Department (HOSPITAL_COMMUNITY)
Admission: EM | Admit: 2021-09-16 | Discharge: 2021-09-16 | Disposition: A | Discharge status: Home / Self Care | Payer: Medicare Other | Attending: Emergency Medicine | Admitting: Emergency Medicine

## 2021-09-16 DIAGNOSIS — R778 Other specified abnormalities of plasma proteins: Secondary | ICD-10-CM | POA: Diagnosis not present

## 2021-09-16 DIAGNOSIS — Z7902 Long term (current) use of antithrombotics/antiplatelets: Secondary | ICD-10-CM | POA: Diagnosis not present

## 2021-09-16 DIAGNOSIS — R7989 Other specified abnormal findings of blood chemistry: Secondary | ICD-10-CM | POA: Insufficient documentation

## 2021-09-16 DIAGNOSIS — Z7951 Long term (current) use of inhaled steroids: Secondary | ICD-10-CM | POA: Insufficient documentation

## 2021-09-16 DIAGNOSIS — K6289 Other specified diseases of anus and rectum: Secondary | ICD-10-CM | POA: Diagnosis present

## 2021-09-16 DIAGNOSIS — Z992 Dependence on renal dialysis: Secondary | ICD-10-CM | POA: Insufficient documentation

## 2021-09-16 DIAGNOSIS — K644 Residual hemorrhoidal skin tags: Secondary | ICD-10-CM | POA: Diagnosis not present

## 2021-09-16 DIAGNOSIS — J449 Chronic obstructive pulmonary disease, unspecified: Secondary | ICD-10-CM | POA: Diagnosis not present

## 2021-09-16 DIAGNOSIS — K5641 Fecal impaction: Secondary | ICD-10-CM | POA: Insufficient documentation

## 2021-09-16 DIAGNOSIS — Z20822 Contact with and (suspected) exposure to covid-19: Secondary | ICD-10-CM | POA: Diagnosis not present

## 2021-09-16 DIAGNOSIS — E1122 Type 2 diabetes mellitus with diabetic chronic kidney disease: Secondary | ICD-10-CM | POA: Insufficient documentation

## 2021-09-16 DIAGNOSIS — R079 Chest pain, unspecified: Secondary | ICD-10-CM

## 2021-09-16 DIAGNOSIS — K5730 Diverticulosis of large intestine without perforation or abscess without bleeding: Secondary | ICD-10-CM | POA: Insufficient documentation

## 2021-09-16 DIAGNOSIS — Z79899 Other long term (current) drug therapy: Secondary | ICD-10-CM | POA: Diagnosis not present

## 2021-09-16 DIAGNOSIS — N189 Chronic kidney disease, unspecified: Secondary | ICD-10-CM | POA: Diagnosis not present

## 2021-09-16 DIAGNOSIS — D72829 Elevated white blood cell count, unspecified: Secondary | ICD-10-CM | POA: Insufficient documentation

## 2021-09-16 DIAGNOSIS — K5900 Constipation, unspecified: Secondary | ICD-10-CM

## 2021-09-16 DIAGNOSIS — Z7982 Long term (current) use of aspirin: Secondary | ICD-10-CM | POA: Diagnosis not present

## 2021-09-16 DIAGNOSIS — K8020 Calculus of gallbladder without cholecystitis without obstruction: Secondary | ICD-10-CM | POA: Diagnosis not present

## 2021-09-16 DIAGNOSIS — I129 Hypertensive chronic kidney disease with stage 1 through stage 4 chronic kidney disease, or unspecified chronic kidney disease: Secondary | ICD-10-CM | POA: Insufficient documentation

## 2021-09-16 LAB — BASIC METABOLIC PANEL
Anion gap: 14 (ref 5–15)
BUN: 42 mg/dL — ABNORMAL HIGH (ref 8–23)
CO2: 25 mmol/L (ref 22–32)
Calcium: 9.4 mg/dL (ref 8.9–10.3)
Chloride: 101 mmol/L (ref 98–111)
Creatinine, Ser: 9.31 mg/dL — ABNORMAL HIGH (ref 0.44–1.00)
GFR, Estimated: 4 mL/min — ABNORMAL LOW (ref >60)
Glucose, Bld: 163 mg/dL — ABNORMAL HIGH (ref 70–99)
Potassium: 4.9 mmol/L (ref 3.5–5.1)
Sodium: 140 mmol/L (ref 135–145)

## 2021-09-16 LAB — CBC WITH DIFFERENTIAL/PLATELET
Abs Immature Granulocytes: 0.04 10*3/uL (ref 0.00–0.07)
Basophils Absolute: 0.1 10*3/uL (ref 0.0–0.1)
Basophils Relative: 1 %
Eosinophils Absolute: 0.4 10*3/uL (ref 0.0–0.5)
Eosinophils Relative: 3 %
HCT: 29.9 % — ABNORMAL LOW (ref 36.0–46.0)
Hemoglobin: 9.7 g/dL — ABNORMAL LOW (ref 12.0–15.0)
Immature Granulocytes: 0 %
Lymphocytes Relative: 18 %
Lymphs Abs: 2.3 10*3/uL (ref 0.7–4.0)
MCH: 30.4 pg (ref 26.0–34.0)
MCHC: 32.4 g/dL (ref 30.0–36.0)
MCV: 93.7 fL (ref 80.0–100.0)
Monocytes Absolute: 0.8 10*3/uL (ref 0.1–1.0)
Monocytes Relative: 7 %
Neutro Abs: 8.8 10*3/uL — ABNORMAL HIGH (ref 1.7–7.7)
Neutrophils Relative %: 71 %
Platelets: 281 10*3/uL (ref 150–400)
RBC: 3.19 MIL/uL — ABNORMAL LOW (ref 3.87–5.11)
RDW: 15.3 % (ref 11.5–15.5)
WBC: 12.4 10*3/uL — ABNORMAL HIGH (ref 4.0–10.5)
nRBC: 0.2 % (ref 0.0–0.2)

## 2021-09-16 LAB — TROPONIN I (HIGH SENSITIVITY)
Troponin I (High Sensitivity): 22 ng/L — ABNORMAL HIGH (ref <18)
Troponin I (High Sensitivity): 22 ng/L — ABNORMAL HIGH (ref <18)

## 2021-09-16 LAB — RESP PANEL BY RT-PCR (FLU A&B, COVID) ARPGX2
Influenza A by PCR: NEGATIVE
Influenza B by PCR: NEGATIVE
SARS Coronavirus 2 by RT PCR: NEGATIVE

## 2021-09-16 LAB — HEPATIC FUNCTION PANEL
ALT: 9 U/L (ref 0–44)
AST: 19 U/L (ref 15–41)
Albumin: 3.5 g/dL (ref 3.5–5.0)
Alkaline Phosphatase: 79 U/L (ref 38–126)
Bilirubin, Direct: <0.1 mg/dL (ref 0.0–0.2)
Total Bilirubin: 0.4 mg/dL (ref 0.3–1.2)
Total Protein: 7.2 g/dL (ref 6.5–8.1)

## 2021-09-16 LAB — POC OCCULT BLOOD, ED: Fecal Occult Bld: NEGATIVE

## 2021-09-16 IMAGING — CT CT ABD-PELV W/O CM
2 of 4 series · 15 of 46 positions shown, 17 images · non-contrast
Comparison: None.

CLINICAL DATA: Abdominal pain, acute, nonlocalized. Concern for
sterocoral colitis, CKD on dialysis (still makes some urine)



[Series 3: abd/ pelvis 5.0 i30f 2 · axial · 0.69mm/px · z∈[+873,+1303]mm · 12 of 96 slices shown, 14 images]
[im 5/96  soft-tissue]
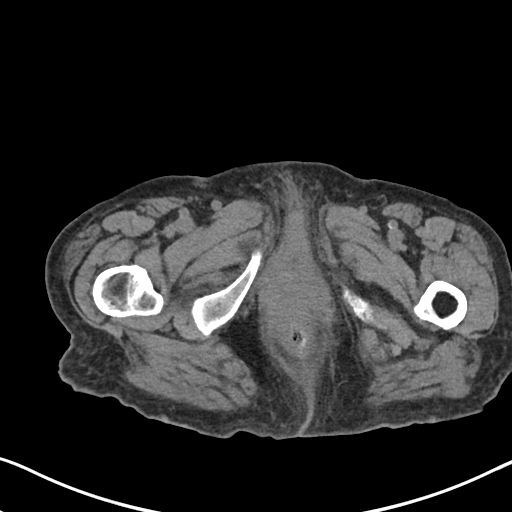
[im 5/96  bone]
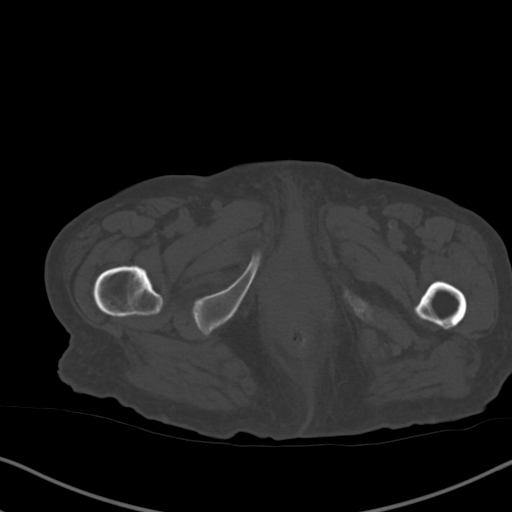
[im 13/96  soft-tissue]
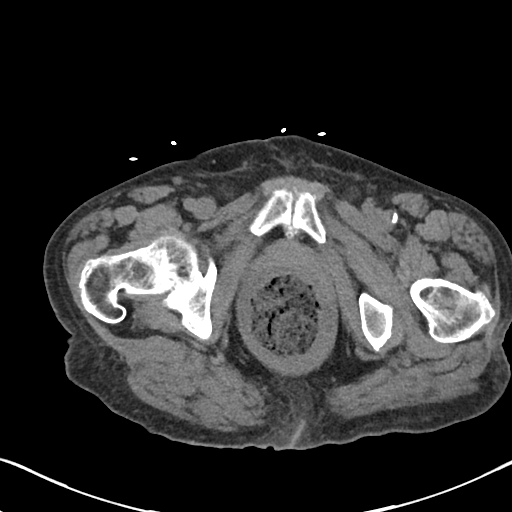
[im 22/96  soft-tissue]
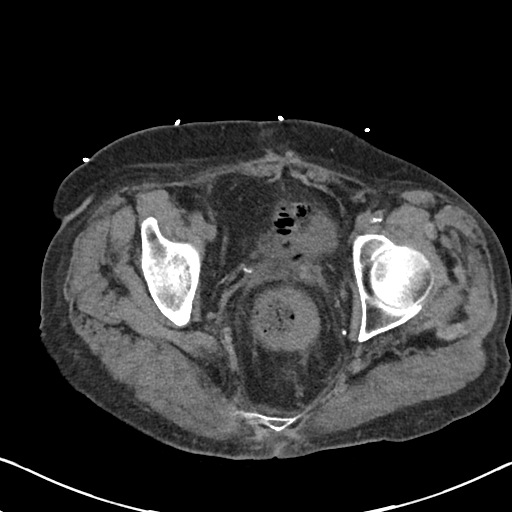
[im 31/96  soft-tissue]
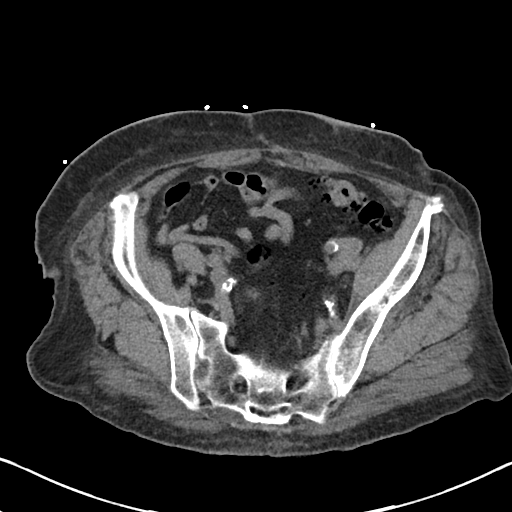
[im 35/96  soft-tissue]
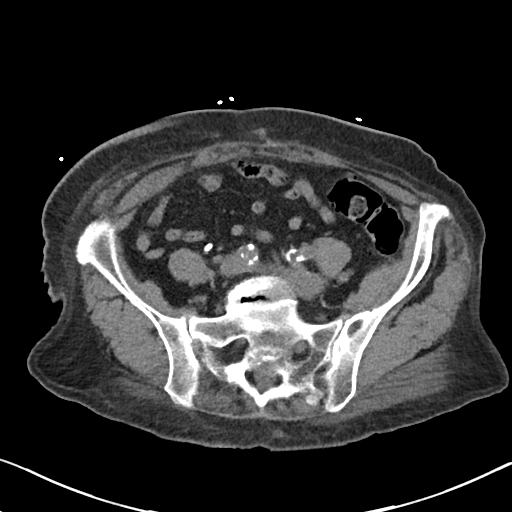
[im 44/96  soft-tissue]
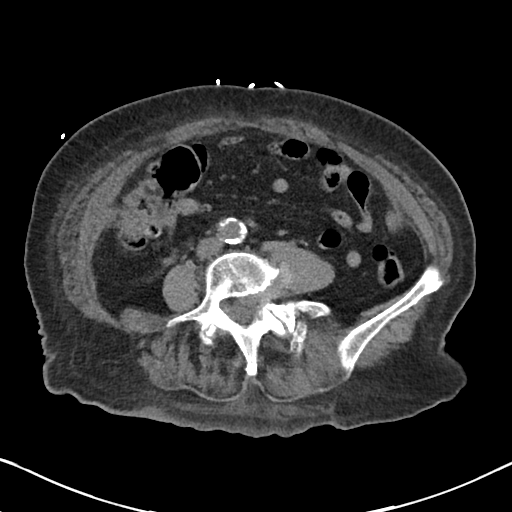
[im 52/96  soft-tissue]
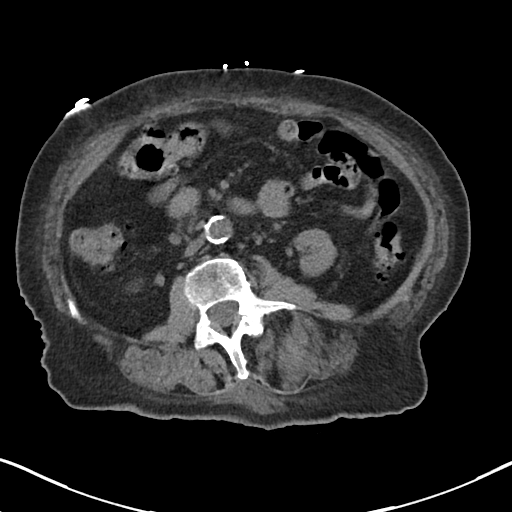
[im 61/96  soft-tissue]
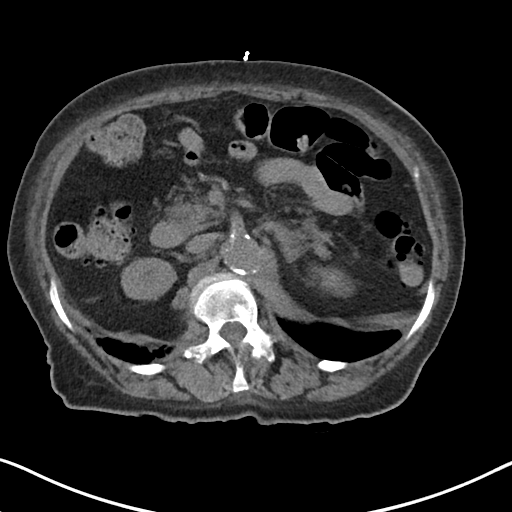
[im 65/96  soft-tissue]
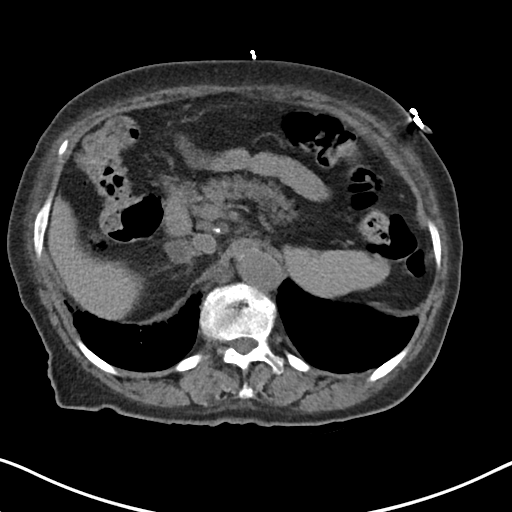
[im 65/96  bone]
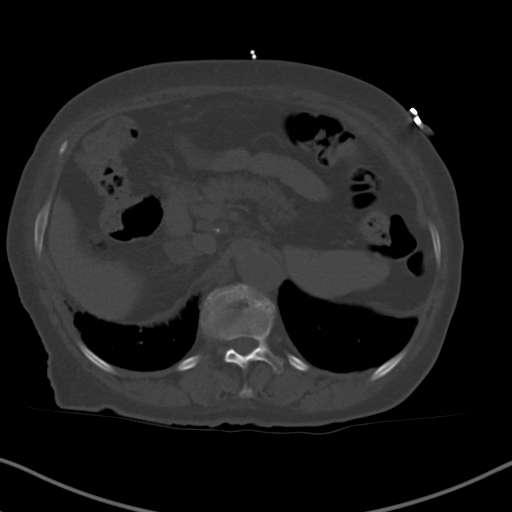
[im 74/96  soft-tissue]
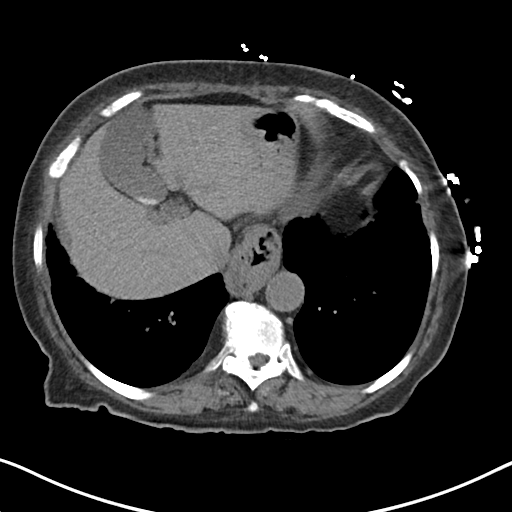
[im 83/96  soft-tissue]
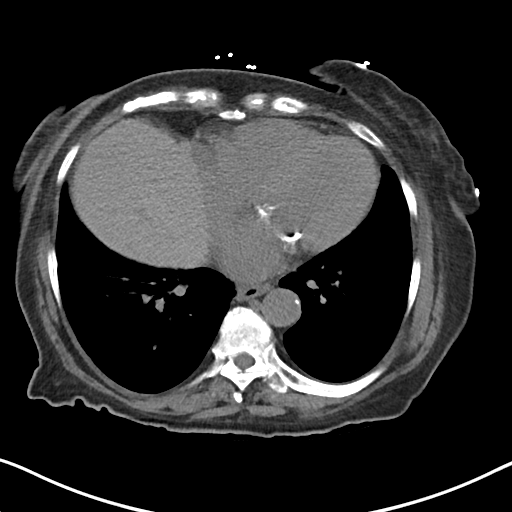
[im 91/96  soft-tissue]
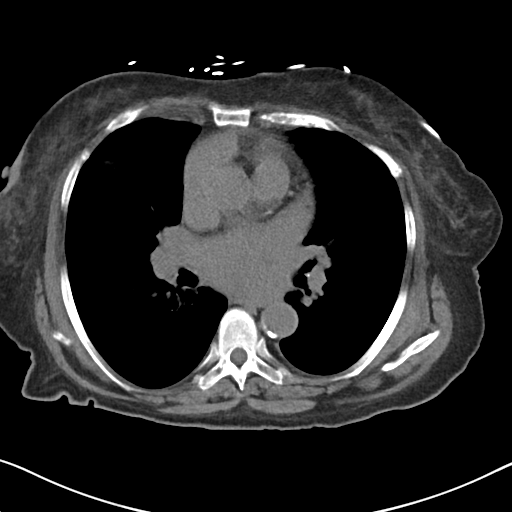

[Series 6: cor st · coronal · 0.84mm/px · 3 of 90 slices shown]
[im 30/90  soft-tissue]
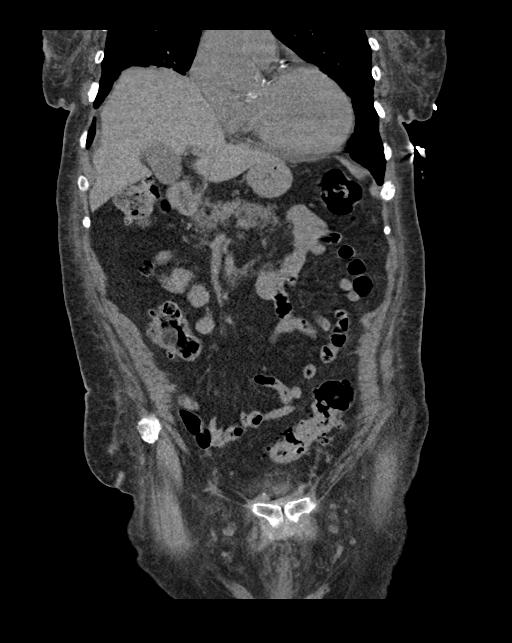
[im 40/90  soft-tissue]
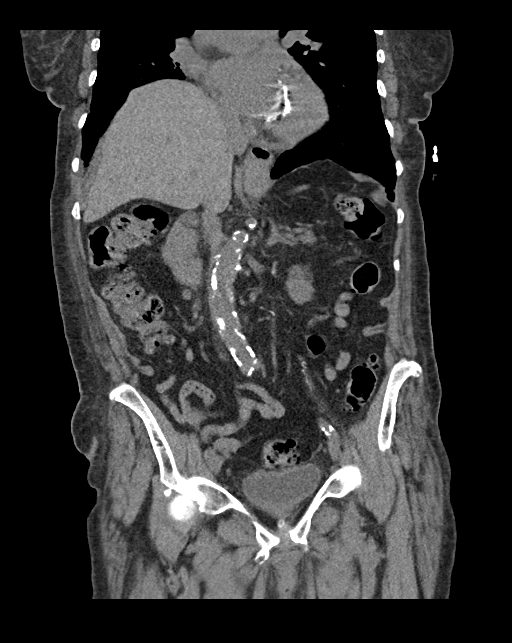
[im 50/90  soft-tissue]
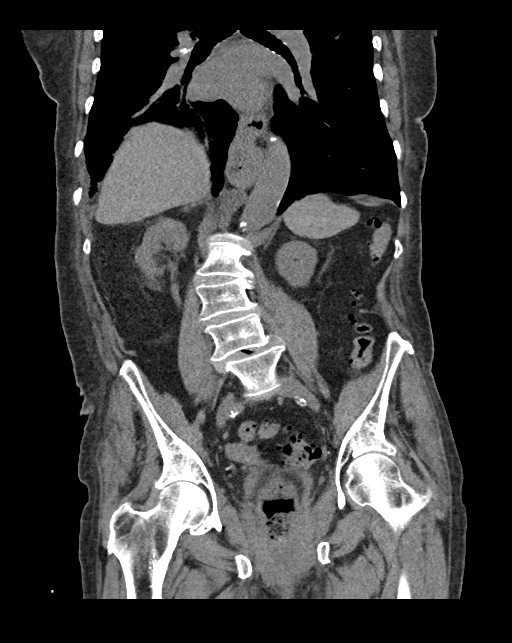

[15 of 46 positions shown; findings below may reference images not displayed]

FINDINGS: Lower chest: Acute abnormality. Query right hilar calcified lymph
nodes. Aortic valve leaflet and mitral annular calcifications.
Coronary artery calcifications. At least small volume hiatal hernia.

Hepatobiliary: No focal liver abnormality. Calcified gallstone noted
within the gallbladder lumen. No gallbladder wall thickening or
pericholecystic fluid. No biliary dilatation.

Pancreas: Diffusely atrophic. No focal lesion. Otherwise normal
pancreatic contour. No surrounding inflammatory changes. No main
pancreatic ductal dilatation.

Spleen: Normal in size without focal abnormality.

Adrenals/Urinary Tract:

There is a 1.4 x 1.7 cm right adrenal gland nodule with a density of
3 Hounsfield units.

Bilateral renal atrophy and scarring.  No left adrenal nodularity.

No nephrolithiasis and no hydronephrosis. No definite
contour-deforming renal mass.

No ureterolithiasis or hydroureter.

The urinary bladder is decompressed and grossly unremarkable.

Stomach/Bowel: Stomach is within normal limits. No evidence of bowel
wall thickening or dilatation. Diffuse colonic diverticulosis. There
is a 5.7 cm rectal stool ball with associated possible mild rectal
wall thickening and perirectal fat stranding. The appendix is not
definitely identified with no inflammatory changes in the right
lower quadrant to suggest acute appendicitis.

Vascular/Lymphatic: No abdominal aorta or iliac aneurysm. Severe
atherosclerotic plaque of the aorta and its branches. No abdominal,
pelvic, or inguinal lymphadenopathy.

Reproductive: Status post hysterectomy. No adnexal masses.

Other: No intraperitoneal free fluid. No intraperitoneal free gas.
No organized fluid collection.

Musculoskeletal:

No abdominal wall hernia or abnormality.

No suspicious lytic or blastic osseous lesions. No acute displaced
fracture. Redemonstration of multiple subacute rib fractures.
Dextroscoliosis centered at the L2-L3 level with associated
multilevel degenerative changes of the spine.
IMPRESSION: 1. A 5.7 cm rectal stool ball with possible mild rectal wall
thickening and perirectal fat stranding. Markedly limited evaluation
on this noncontrast study. Cannot exclude developing mild stercoral
colitis.
2. Diffuse colonic diverticulosis with no acute diverticulitis.
3. Cholelithiasis no CT findings of acute cholecystitis.
4. Aortic Atherosclerosis ([HK]-[HK]) including coronary , aortic
valve leaflet, and mitral annular calcifications. Correlate for
aortic stenosis.
5. A 1.7 cm right adrenal gland nodule likely representing an
adenoma.

## 2021-09-16 IMAGING — DX DG CHEST 2V
2 series · 2 of 2 positions shown · non-contrast
Comparison: Chest radiograph [DATE]

CLINICAL DATA: Chest pain, missed dialysis

EXAM:
CHEST - 2 VIEW

[chest lat]
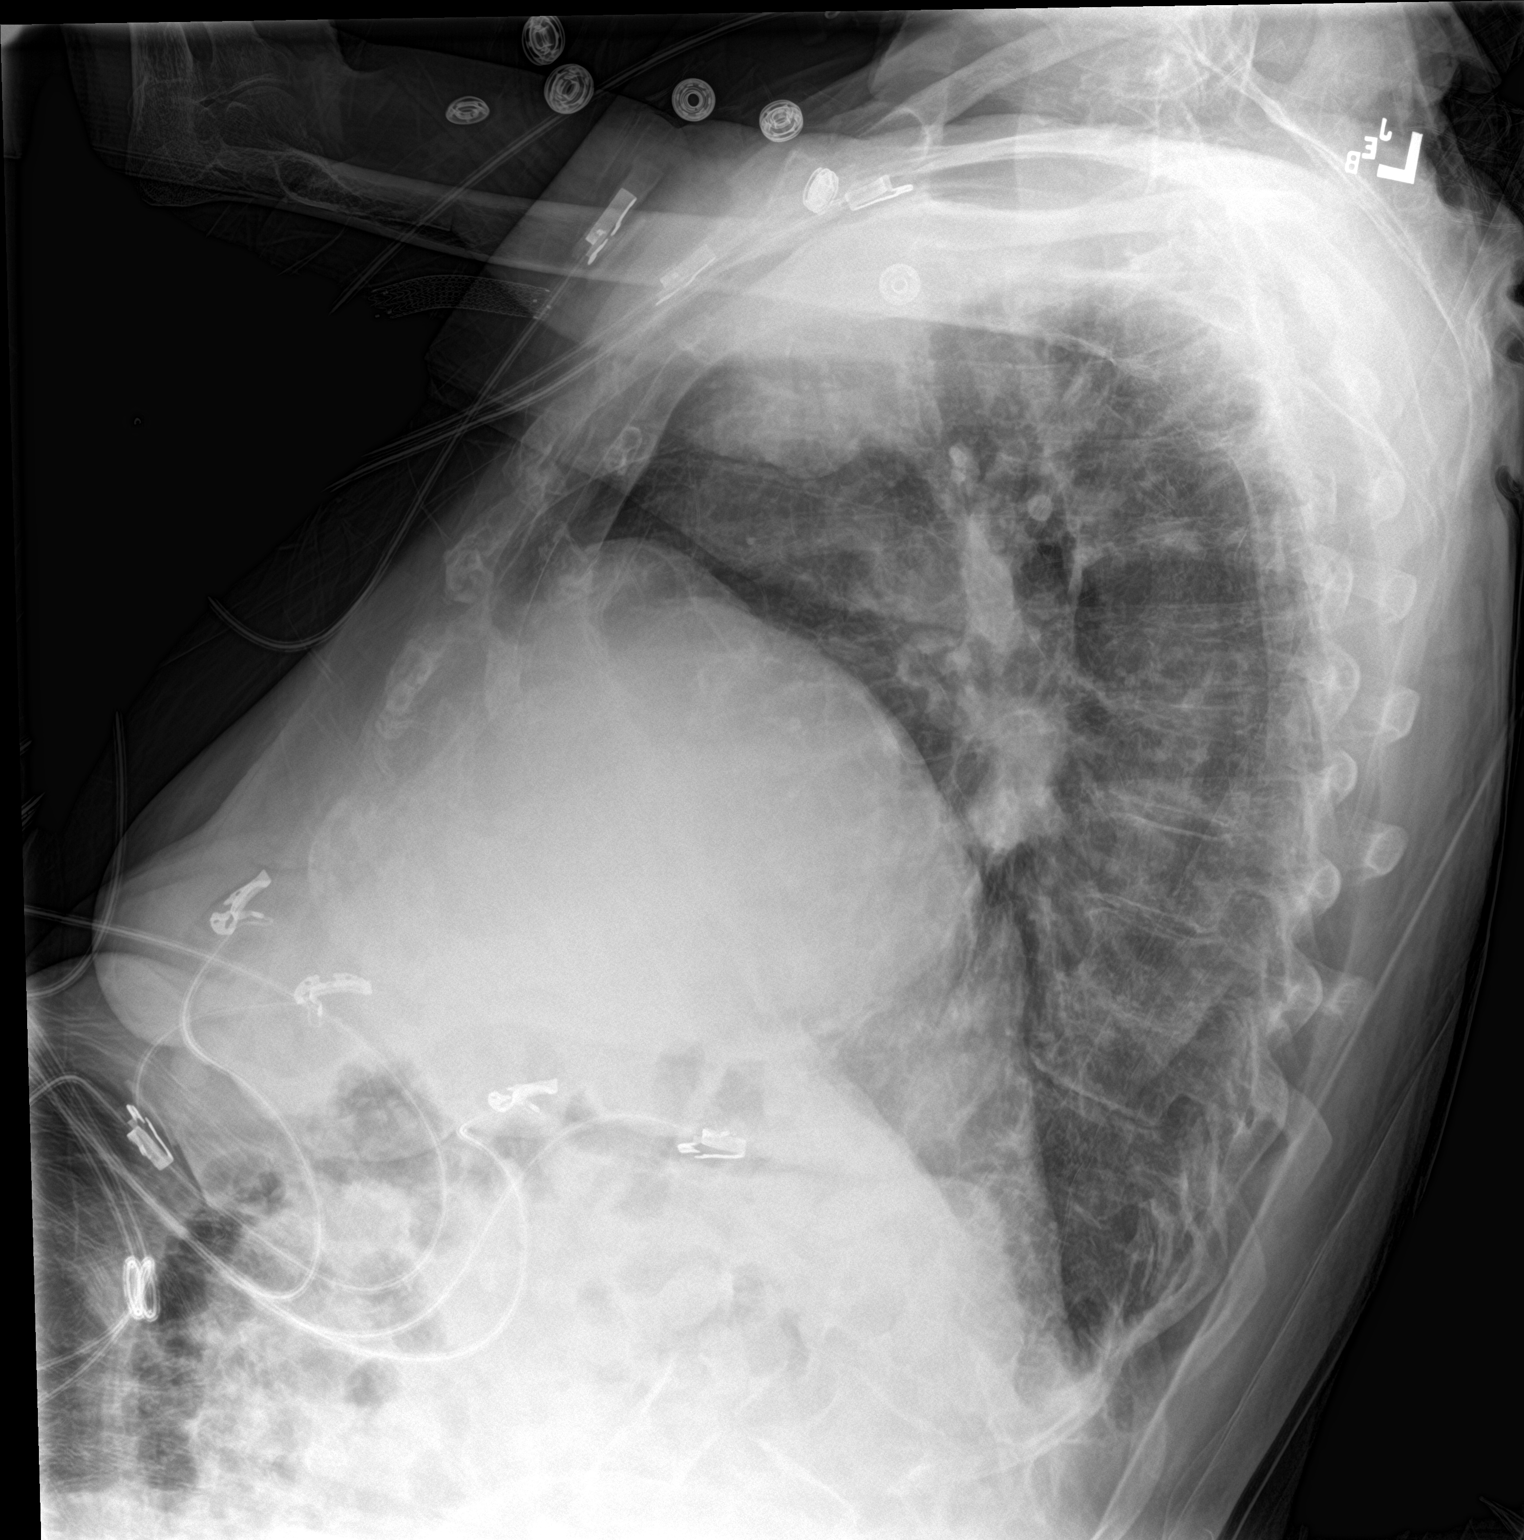

[chest ap]
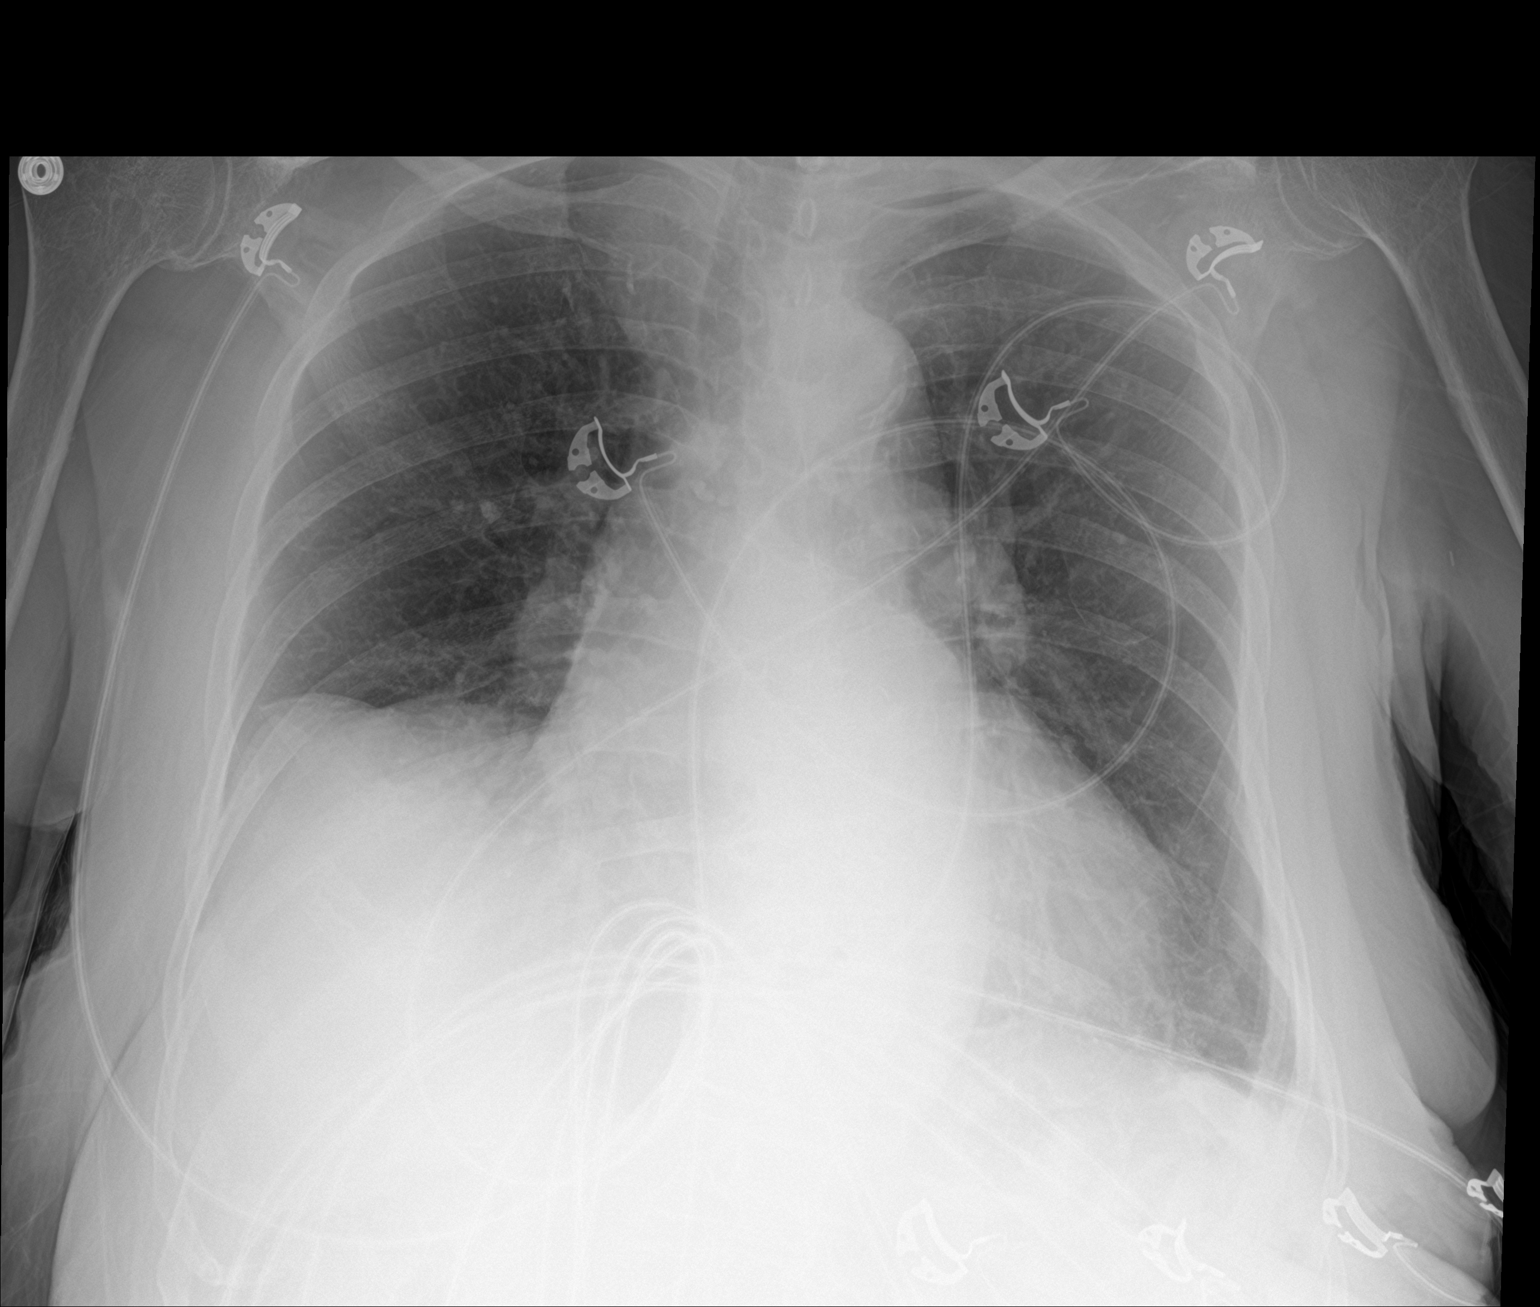

[2 of 2 positions shown; findings below may reference images not displayed]

FINDINGS: The heart is enlarged, unchanged. The upper mediastinal contours are
stable.

There is asymmetric elevation of the right hemidiaphragm, unchanged.
There is no focal consolidation or pulmonary edema. There is no
pleural effusion or pneumothorax.

There is no acute osseous abnormality.
IMPRESSION: 1. No radiographic evidence of acute cardiopulmonary process.
2. Unchanged cardiomegaly and asymmetric elevation of the right
hemidiaphragm.

## 2021-09-16 MED ORDER — GLYCERIN (LAXATIVE) 2 G RE SUPP
1.0000 | Freq: Once | RECTAL | Status: AC
  Administered 2021-09-16: 1 via RECTAL
  Filled 2021-09-16: qty 1

## 2021-09-16 MED ORDER — FLEET ENEMA 7-19 GM/118ML RE ENEM
1.0000 | ENEMA | Freq: Once | RECTAL | Status: AC
  Administered 2021-09-16: 1 via RECTAL
  Filled 2021-09-16: qty 1

## 2021-09-16 MED ORDER — PANTOPRAZOLE SODIUM 40 MG IV SOLR: 40.0000 mg | Freq: Two times a day (BID) | INTRAVENOUS | Status: DC

## 2021-09-16 MED ORDER — FLEET ENEMA 7-19 GM/118ML RE ENEM
1.0000 | ENEMA | Freq: Once | RECTAL | Status: AC
Start: 2021-09-16 — End: 2021-09-16
  Administered 2021-09-16: 1 via RECTAL
  Filled 2021-09-16: qty 1

## 2021-09-16 MED ORDER — ASPIRIN 325 MG PO TABS
325.0000 mg | ORAL_TABLET | Freq: Every day | ORAL | Status: DC
  Filled 2021-09-16: qty 1

## 2021-09-16 MED ORDER — PANTOPRAZOLE 80MG IVPB - SIMPLE MED
80.0000 mg | Freq: Once | INTRAVENOUS | Status: DC
  Filled 2021-09-16: qty 100

## 2021-09-16 MED ORDER — PANTOPRAZOLE INFUSION (NEW) - SIMPLE MED
8.0000 mg/h | INTRAVENOUS | Status: DC
  Filled 2021-09-16: qty 100

## 2021-09-16 MED ORDER — NITROGLYCERIN 0.4 MG SL SUBL: 0.4000 mg | SUBLINGUAL_TABLET | SUBLINGUAL | Status: DC | PRN

## 2021-09-16 NOTE — ED Provider Notes (Addendum)
[facility] EMERGENCY DEPARTMENT Provider Note   CSN: [id number] Arrival date & time: 09/16/21  1034     History  Chief Complaint  Patient presents with   Chest Pain   Constipation    Stacey Dorsey is a 88 y.o. female.   Chest Pain Associated symptoms: no abdominal pain, no cough, no nausea, no shortness of breath and no vomiting   Constipation Associated symptoms: no abdominal pain, no nausea and no vomiting    88 year old female with a history of CKD on HD Tuesday, Thursday, Saturday (still makes urine), HTN, HLD, DM2, COPD, CVA, previous cardiac arrest who presents to the emergency department with multiple complaints.  Additional history was provided by EMS.  The patient endorses chest pain with mild lightheadedness in the setting of missing dialysis.  The patient states that she last went to dialysis last Tuesday.  She did not go on Thursday or Saturday because she did not feel well.  She states that she has not had a bowel movement in 5 days.  She feels impacted.  She also endorses some external passage of dark stools.  She is on iron supplementation.  She denies any fever, chills, headache, vision changes, neurologic deficit.  She describes a pressure-like chest pain sensation anteriorly which subsequently resolved on repeat questioning. She endorses sharp rectal pain and fullness.  Home Medications Prior to Admission medications   Medication Sig Start Date End Date Taking? Authorizing Provider  acetaminophen (TYLENOL) 500 MG tablet Take 2 tablets (1,000 mg total) by mouth 3 (three) times daily. For 3 to 5 days and then can consider changing it to as needed based on improvement of her musculoskeletal chest pain related to rib fractures. Patient taking differently: Take 1,000 mg by mouth every 8 (eight) hours as needed for mild pain. 07/16/21  Yes Hongalgi, Lenis Dickinson, MD  albuterol (PROVENTIL) (2.5 MG/3ML) 0.083% nebulizer solution Inhale 2.5 mg into the lungs  every 4 (four) hours as needed for wheezing or shortness of breath. 02/16/20  Yes [provider]  albuterol (VENTOLIN HFA) 108 (90 Base) MCG/ACT inhaler Inhale 2 puffs into the lungs every 4 (four) hours as needed for shortness of breath. 04/02/20  Yes [provider]  allopurinol (ZYLOPRIM) 100 MG tablet Take 100 mg by mouth daily as needed (gout flare). 01/18/21  Yes [provider]  carvedilol (COREG) 25 MG tablet Take 1 tablet (25 mg total) by mouth See admin instructions. Take 25 mg twice daily on nondailysis days (Sun, Mon, Wed, and Fri) Patient taking differently: Take 25 mg by mouth in the morning and at bedtime. 07/26/21  Yes Setzer, Edman Circle, PA-C  cloNIDine (CATAPRES) 0.1 MG tablet Take 1 tablet (0.1 mg total) by mouth 2 (two) times daily. 07/26/21  Yes Setzer, Edman Circle, PA-C  clopidogrel (PLAVIX) 75 MG tablet Take 1 tablet (75 mg total) by mouth daily. 07/26/21  Yes Setzer, Edman Circle, PA-C  ferric citrate (AURYXIA) 1 GM 210 MG(Fe) tablet Take 1 tablet (210 mg total) by mouth 3 (three) times daily with meals. 07/26/21  Yes Setzer, Edman Circle, PA-C  fluticasone-salmeterol (ADVAIR HFA) 115-21 MCG/ACT inhaler Inhale 1 puff into the lungs 2 (two) times daily. 07/26/21  Yes Setzer, Edman Circle, PA-C  lidocaine-prilocaine (EMLA) cream Apply 1 application topically 3 (three) times a week. 04/05/21  Yes [provider]  Menthol-Methyl Salicylate (MUSCLE RUB) 10-15 % CREA Apply 1 application topically as needed for muscle pain.   Yes [provider]  NIFEdipine (  PROCARDIA-XL/NIFEDICAL-XL) 30 MG 24 hr tablet Take 2 tablets (60 mg total) by mouth at bedtime. Patient taking differently: Take 30 mg by mouth in the morning and at bedtime. 07/26/21  Yes Setzer, Edman Circle, PA-C  omeprazole (PRILOSEC) 20 MG capsule Take 2 capsules (40 mg total) by mouth daily. 07/26/21  Yes Setzer, Edman Circle, PA-C  Polyethyl Glycol-Propyl Glycol (SYSTANE OP) Place 1 drop into both eyes daily  as needed (dry eyes).   Yes [provider]  polyethylene glycol (MIRALAX / GLYCOLAX) 17 g packet Take 17 g by mouth daily. Patient taking differently: Take 17 g by mouth daily as needed for mild constipation. 07/26/21  Yes Setzer, Edman Circle, PA-C  rOPINIRole (REQUIP) 0.5 MG tablet Take 0.5 mg by mouth at bedtime. 05/03/21  Yes [provider]  sertraline (ZOLOFT) 25 MG tablet Take 25 mg by mouth daily. 09/04/21  Yes [provider]  simvastatin (ZOCOR) 20 MG tablet Take 20 mg by mouth at bedtime. 05/03/21  Yes [provider]  traZODone (DESYREL) 50 MG tablet TAKE 1 TABLET(50 MG) BY MOUTH AT BEDTIME AS NEEDED FOR SLEEP Patient taking differently: Take 50 mg by mouth at bedtime as needed for sleep. 09/02/21  Yes Bayard Hugger, NP  acetaminophen-codeine (TYLENOL #3) 300-30 MG tablet Take 1 tablet by mouth every 4 (four) hours as needed for moderate pain. Patient not taking: Reported on 09/12/2021 07/26/21   Barbie Banner, PA-C  aspirin 325 MG EC tablet Take 1 tablet (325 mg total) by mouth daily. Patient not taking: Reported on 09/12/2021 07/26/21   Barbie Banner, PA-C  cinacalcet (SENSIPAR) 30 MG tablet Take 1 tablet (30 mg total) by mouth Every Tuesday,Thursday,and Saturday with dialysis. Patient not taking: Reported on 09/12/2021 07/27/21   Vaughan Basta, Edman Circle, PA-C  Darbepoetin Alfa (ARANESP) 60 MCG/0.3ML SOSY injection Inject 0.3 mLs (60 mcg total) into the skin every Thursday at 6pm. Patient not taking: Reported on 09/16/2021 08/01/21   Barbie Banner, PA-C  docusate sodium (COLACE) 100 MG capsule Take 1 capsule (100 mg total) by mouth 2 (two) times daily. Patient not taking: Reported on 09/12/2021 07/16/21   Modena Jansky, MD  doxercalciferol (HECTOROL) 4 MCG/2ML injection Inject 2.5 mLs (5 mcg total) into the vein Every Tuesday,Thursday,and Saturday with dialysis. Patient not taking: Reported on 09/16/2021 07/27/21   Barbie Banner, PA-C  lidocaine  (LIDODERM) 5 % Place 2 patches onto the skin daily. Remove & Discard patch within 12 hours or as directed by MD Patient not taking: Reported on 09/12/2021 07/26/21   Barbie Banner, PA-C  multivitamin (RENA-VIT) TABS tablet Take 1 tablet by mouth at bedtime. Patient not taking: Reported on 09/12/2021 07/26/21   Barbie Banner, PA-C      Allergies    Penicillins, Prednisone, Scallops [shellfish allergy], and Tramadol    Review of Systems   Review of Systems  Respiratory:  Negative for cough and shortness of breath.   Cardiovascular:  Positive for chest pain.  Gastrointestinal:  Positive for constipation and rectal pain. Negative for abdominal pain, nausea and vomiting.  All other systems reviewed and are negative.  Physical Exam Updated Vital Signs BP (!) 174/68    Pulse 73    Temp 98.2 F (36.8 C) (Oral)    Resp 15    Ht 5\' 6"  (1.676 m)    Wt 58.5 kg    SpO2 98%    BMI 20.82 kg/m  Physical Exam Vitals and nursing note reviewed.  Exam conducted with a chaperone present.  Constitutional:      General: She is not in acute distress. HENT:     Head: Normocephalic and atraumatic.     Mouth/Throat:     Pharynx: Oropharynx is clear.  Eyes:     Conjunctiva/sclera: Conjunctivae normal.     Pupils: Pupils are equal, round, and reactive to light.  Cardiovascular:     Rate and Rhythm: Normal rate and regular rhythm.     Pulses: Normal pulses.  Pulmonary:     Effort: Pulmonary effort is normal. No tachypnea or respiratory distress.     Breath sounds: Examination of the left-lower field reveals rhonchi. Rhonchi present.  Abdominal:     General: There is no distension.     Tenderness: There is no abdominal tenderness. There is no guarding.  Genitourinary:    Rectum: Guaiac result negative. Tenderness and external hemorrhoid present. No mass.     Comments: External hemorrhoids present, nontender nonthrombosed.  Dark stool appreciated in the rectum appears to be impacted, removed to  disimpact which the patient did not tolerate.  Fecal occult negative. Musculoskeletal:        General: No deformity or signs of injury.     Cervical back: Neck supple.     Right lower leg: No edema.     Left lower leg: No edema.  Skin:    Findings: No lesion or rash.  Neurological:     General: No focal deficit present.     Mental Status: She is alert. Mental status is at baseline.    ED Results / Procedures / Treatments   Labs (all labs ordered are listed, but only abnormal results are displayed) Labs Reviewed  CBC WITH DIFFERENTIAL/PLATELET - Abnormal; Notable for the following components:      Result Value   WBC 12.4 (*)    RBC 3.19 (*)    Hemoglobin 9.7 (*)    HCT 29.9 (*)    Neutro Abs 8.8 (*)    All other components within normal limits  BASIC METABOLIC PANEL - Abnormal; Notable for the following components:   Glucose, Bld 163 (*)    BUN 42 (*)    Creatinine, Ser 9.31 (*)    GFR, Estimated 4 (*)    All other components within normal limits  TROPONIN I (HIGH SENSITIVITY) - Abnormal; Notable for the following components:   Troponin I (High Sensitivity) 22 (*)    All other components within normal limits  TROPONIN I (HIGH SENSITIVITY) - Abnormal; Notable for the following components:   Troponin I (High Sensitivity) 22 (*)    All other components within normal limits  RESP PANEL BY RT-PCR (FLU A&B, COVID) [id number]  HEPATIC FUNCTION PANEL  I-STAT CHEM 8, ED  POC OCCULT BLOOD, ED    EKG EKG Interpretation  Date/Time:  Monday September 16 2021 10:53:48 EST Ventricular Rate:  68 PR Interval:  134 QRS Duration: 111 QT Interval:  421 QTC Calculation: 448 R Axis:   58 Text Interpretation: Sinus rhythm No significant change since last tracing Confirmed by Regan Lemming ([id number]) on 09/16/2021 11:06:09 AM  Radiology CT ABDOMEN PELVIS WO CONTRAST  Result Date: 09/16/2021 CLINICAL DATA:  Abdominal pain, acute, nonlocalized. Concern for sterocoral colitis, CKD on dialysis  (still makes some urine) EXAM: CT ABDOMEN AND PELVIS WITHOUT CONTRAST TECHNIQUE: Multidetector CT imaging of the abdomen and pelvis was performed following the standard protocol without IV contrast. RADIATION DOSE REDUCTION: This exam was performed according to the departmental  dose-optimization program which includes automated exposure control, adjustment of the mA and/or kV according to patient size and/or use of iterative reconstruction technique. COMPARISON:  None. FINDINGS: Lower chest: Acute abnormality. Query right hilar calcified lymph nodes. Aortic valve leaflet and mitral annular calcifications. Coronary artery calcifications. At least small volume hiatal hernia. Hepatobiliary: No focal liver abnormality. Calcified gallstone noted within the gallbladder lumen. No gallbladder wall thickening or pericholecystic fluid. No biliary dilatation. Pancreas: Diffusely atrophic. No focal lesion. Otherwise normal pancreatic contour. No surrounding inflammatory changes. No main pancreatic ductal dilatation. Spleen: Normal in size without focal abnormality. Adrenals/Urinary Tract: There is a 1.4 x 1.7 cm right adrenal gland nodule with a density of 3 Hounsfield units. Bilateral renal atrophy and scarring.  No left adrenal nodularity. No nephrolithiasis and no hydronephrosis. No definite contour-deforming renal mass. No ureterolithiasis or hydroureter. The urinary bladder is decompressed and grossly unremarkable. Stomach/Bowel: Stomach is within normal limits. No evidence of bowel wall thickening or dilatation. Diffuse colonic diverticulosis. There is a 5.7 cm rectal stool ball with associated possible mild rectal wall thickening and perirectal fat stranding. The appendix is not definitely identified with no inflammatory changes in the right lower quadrant to suggest acute appendicitis. Vascular/Lymphatic: No abdominal aorta or iliac aneurysm. Severe atherosclerotic plaque of the aorta and its branches. No abdominal,  pelvic, or inguinal lymphadenopathy. Reproductive: Status post hysterectomy. No adnexal masses. Other: No intraperitoneal free fluid. No intraperitoneal free gas. No organized fluid collection. Musculoskeletal: No abdominal wall hernia or abnormality. No suspicious lytic or blastic osseous lesions. No acute displaced fracture. Redemonstration of multiple subacute rib fractures. Dextroscoliosis centered at the L2-L3 level with associated multilevel degenerative changes of the spine. IMPRESSION: 1. A 5.7 cm rectal stool ball with possible mild rectal wall thickening and perirectal fat stranding. Markedly limited evaluation on this noncontrast study. Cannot exclude developing mild stercoral colitis. 2. Diffuse colonic diverticulosis with no acute diverticulitis. 3. Cholelithiasis no CT findings of acute cholecystitis. 4. Aortic Atherosclerosis (ICD10-I70.0) including coronary , aortic valve leaflet, and mitral annular calcifications. Correlate for aortic stenosis. 5. A 1.7 cm right adrenal gland nodule likely representing an adenoma. Electronically Signed   By: Iven Finn M.D.   On: 09/16/2021 18:43   DG Chest 2 View  Result Date: 09/16/2021 CLINICAL DATA:  Chest pain, missed dialysis EXAM: CHEST - 2 VIEW COMPARISON:  Chest radiograph 07/06/2021 FINDINGS: The heart is enlarged, unchanged. The upper mediastinal contours are stable. There is asymmetric elevation of the right hemidiaphragm, unchanged. There is no focal consolidation or pulmonary edema. There is no pleural effusion or pneumothorax. There is no acute osseous abnormality. IMPRESSION: 1. No radiographic evidence of acute cardiopulmonary process. 2. Unchanged cardiomegaly and asymmetric elevation of the right hemidiaphragm. Electronically Signed   By: Valetta Mole M.D.   On: 09/16/2021 11:23    Procedures Fecal disimpaction  Date/Time: 09/16/2021 3:14 PM Performed by: Regan Lemming, MD Authorized by: Regan Lemming, MD  Consent: Verbal consent  obtained. Risks and benefits: risks, benefits and alternatives were discussed Consent given by: patient Required items: required blood products, implants, devices, and special equipment available Patient identity confirmed: verbally with patient Time out: Immediately prior to procedure a "time out" was called to verify the correct patient, procedure, equipment, support staff and site/side marked as required. Preparation: Patient was prepped and draped in the usual sterile fashion. Local anesthesia used: no  Anesthesia: Local anesthesia used: no  Sedation: Patient sedated: no  Comments: Patient did not tolerate the procedure and it  was aborted due to pain      Medications Ordered in ED Medications  nitroGLYCERIN (NITROSTAT) SL tablet 0.4 mg (has no administration in time range)  Glycerin (Adult) 2 g suppository 1 suppository (1 suppository Rectal Given 09/16/21 1556)  sodium phosphate (FLEET) 7-19 GM/118ML enema 1 enema (1 enema Rectal Given 09/16/21 1646)    ED Course/ Medical Decision Making/ A&P                           Medical Decision Making Amount and/or Complexity of Data Reviewed Labs: ordered. Radiology: ordered.  Risk OTC drugs. Prescription drug management.   88 year old female with a history of CKD on HD Tuesday, Thursday, Saturday (still makes urine), HTN, HLD, DM2, COPD, CVA, previous cardiac arrest who presents to the emergency department with multiple complaints.  Additional history was provided by EMS.  The patient endorses chest pain with mild lightheadedness in the setting of missing dialysis.  The patient states that she last went to dialysis last Tuesday.  She did not go on Thursday or Saturday because she did not feel well.  She states that she has not had a bowel movement in 5 days.  She feels impacted.  She also endorses some external passage of dark stools.  She is on iron supplementation.  She denies any fever, chills, headache, vision changes,  neurologic deficit.  She describes a pressure-like chest pain sensation anteriorly which subsequently resolved on repeat questioning. She endorses sharp rectal pain and fullness.  On arrival, the patient was afebrile, not tachycardic or tachypneic, hypertensive BP 171/53, saturating 97% on room air.  She presents with chest pain in the setting of missed dialysis.  Additionally, she presents with concern for fecal impaction with no bowel movement over the past 2 days.  She denies any nausea or vomiting.  She endorses mild shortness of breath.  She still does make urine and has been urinating frequently.  The patient was administered 324 mg of aspirin and 1 sublingual nitroglycerin with EMS without change in her chest discomfort.  It did resolve after arrival to the ED.  Work-up significant for a BMP revealed no evidence of hyperkalemia, potassium 4.9, BUN elevated to 42, creatinine 9.31.  The patient had a nonspecific leukocytosis to 12.4 and a stable hemoglobin at 9.7.  She has a mildly elevated troponin at 22, repeat flat at 22..  She endorses no chest pain at this time.  Hepatic function panel and COVID-19 and influenza PCR testing was negative.  The patient had dark stools but they were fecal occult negative.  Suspect dark stools in the setting of patient's iron supplementation.  Low concern for acute GI bleed at this time.  Concern for possible stercoral colitis in the setting of a mild leukocytosis, rectal pain and fecal impaction.  A CT abdomen pelvis noncontrast was performed as the patient still does make urine and has a GFR of 4.  CT imaging was pending at time of signout.  The patient did have a small bowel movement on the commode while in the emergency department.  Chest x-ray was performed which revealed no evidence of pulmonary edema, no evidence of any other acute cardiac or pulmonary abnormality.  Chest x-ray was reviewed by myself and radiology and I agree with the interpretation.    Low  concern for ACS at this time with flat troponins at 22 and resolution of chest pain without intervention..  Concern for mild hypervolemia in the  setting of missed dialysis, however, I do not see an urgent need for dialysis at this time.  Plan at time of signout to reassess the patient following CT abdomen pelvis.  Signout given to Dr. Eulis Foster at [phone].     Final Clinical Impression(s) / ED Diagnoses Final diagnoses:  Rectal pain  Chest pain, unspecified type  Fecal impaction (HCC)  Constipation, unspecified constipation type    Rx / DC Orders ED Discharge Orders     None             Regan Lemming, MD 09/16/21 1916

## 2021-09-16 NOTE — Discharge Instructions (Signed)
To help prevent further problems with constipation, take a stool softener every day for at least a month.  You can try taking Colace 100 mg twice a day.  If you do not have a good bowel movements with that within the first day or 2, take MiraLAX 1 dose in the morning and 1 in the evening, as a stool softener.  See your doctor if not better in a few days

## 2021-09-16 NOTE — ED Triage Notes (Addendum)
Pt arrived via [facility] with c/c of Chgest Pain and dizziness. Per EMS chest pain since this morning, along with constipation for 5 days. Pt was able to have small bowel movement this morning but no relief with stool softener. Pt is a dialysis pt ([facility]) but skipped Last Thursday and Saturday due to not feeling well.    324 ASA, 1 Nitro w/o change. 166/65, 77HR, 100% RA, CBG 183

## 2021-09-16 NOTE — ED Provider Notes (Signed)
5:05 PM-checkout from Dr. Armandina Gemma to evaluate patient after CT imaging and receiving enema.  Patient actually got enema prior to CT.  CT showed stool ball in the rectum no other acute abnormalities.  No convincing evidence for stercoral colitis on noncontrast imaging.  Initial treatment given, soapsuds enema.  Patient had large volume stool and felt like she evacuated well.  Discharge instructions given.   Daleen Bo, MD 09/16/21 [phone]

## 2021-09-23 ENCOUNTER — Encounter: Payer: Self-pay | Admitting: Cardiology

## 2021-09-23 ENCOUNTER — Ambulatory Visit: Payer: Medicare Other | Admitting: Cardiology

## 2021-09-23 ENCOUNTER — Other Ambulatory Visit: Payer: Self-pay

## 2021-09-23 ENCOUNTER — Other Ambulatory Visit: Payer: Self-pay | Admitting: Cardiology

## 2021-09-23 ENCOUNTER — Ambulatory Visit (INDEPENDENT_AMBULATORY_CARE_PROVIDER_SITE_OTHER): Payer: Medicare Other

## 2021-09-23 VITALS — BP 130/80 | HR 79 | Ht 66.0 in | Wt 131.6 lb

## 2021-09-23 DIAGNOSIS — I4891 Unspecified atrial fibrillation: Secondary | ICD-10-CM

## 2021-09-23 DIAGNOSIS — I639 Cerebral infarction, unspecified: Secondary | ICD-10-CM

## 2021-09-23 DIAGNOSIS — I469 Cardiac arrest, cause unspecified: Secondary | ICD-10-CM | POA: Diagnosis not present

## 2021-09-23 NOTE — Progress Notes (Signed)
Electrophysiology Office Note:    Date:  09/23/2021   ID:  Stacey Dorsey, DOB 01-May-1933, MRN [id number]  PCP:  Juanda Chance  [facility] Cardiologist:  None  [facility] Electrophysiologist:  Vickie Epley, MD   Referring MD: Mindi Curling, PA-C   Chief Complaint: Consult for ILR  History of Present Illness:    Stacey Dorsey is a 88 y.o. female who presents for an evaluation of possible ILR post hospital admission. Their medical history includes hypertension, hyperlipidemia, diabetes, stroke, COPD, and CKD.  Stacey Dorsey was admitted to the hospital 07/16/2021 for acute ischemic left MCA stroke. She was later admitted 09/12/2021 for transient altered mental status occurring during dialysis.  She was also admitted July 04, 2021 for a PEA cardiac arrest.  Today, she is accompanied by a family member who assists with the history. Overall, she is feeling pretty good. Since her hospitalization she has not been herself, including loss of appetite and weight loss.   They report she has not yet received a cardiac monitor in the mail.  She denies any palpitations, chest pain, shortness of breath, or peripheral edema. No lightheadedness, headaches, syncope, orthopnea, or PND.     Past Medical History:  Diagnosis Date   Chronic kidney disease    COPD (chronic obstructive pulmonary disease) (HCC)    Diabetes mellitus without complication (HCC)    Hyperlipidemia    Hypertension     Past Surgical History:  Procedure Laterality Date   ABDOMINAL HYSTERECTOMY     AV FISTULA PLACEMENT     IR REMOVAL TUN CV CATH W/O FL  09/07/2020    Current Medications: Current Meds  Medication Sig   acetaminophen (TYLENOL) 500 MG tablet Take 2 tablets (1,000 mg total) by mouth 3 (three) times daily. For 3 to 5 days and then can consider changing it to as needed based on improvement of her musculoskeletal chest pain related to rib fractures. (Patient taking differently: Take 1,000 mg  by mouth every 8 (eight) hours as needed for mild pain.)   albuterol (PROVENTIL) (2.5 MG/3ML) 0.083% nebulizer solution Inhale 2.5 mg into the lungs every 4 (four) hours as needed for wheezing or shortness of breath.   albuterol (VENTOLIN HFA) 108 (90 Base) MCG/ACT inhaler Inhale 2 puffs into the lungs every 4 (four) hours as needed for shortness of breath.   carvedilol (COREG) 25 MG tablet Take 1 tablet (25 mg total) by mouth See admin instructions. Take 25 mg twice daily on nondailysis days (Sun, Mon, Wed, and Fri) (Patient taking differently: Take 25 mg by mouth in the morning and at bedtime.)   cinacalcet (SENSIPAR) 30 MG tablet Take 1 tablet (30 mg total) by mouth Every Tuesday,Thursday,and Saturday with dialysis.   cloNIDine (CATAPRES) 0.1 MG tablet Take 1 tablet (0.1 mg total) by mouth 2 (two) times daily.   clopidogrel (PLAVIX) 75 MG tablet Take 1 tablet (75 mg total) by mouth daily.   Darbepoetin Alfa (ARANESP) 60 MCG/0.3ML SOSY injection Inject 0.3 mLs (60 mcg total) into the skin every Thursday at 6pm.   docusate sodium (COLACE) 100 MG capsule Take 1 capsule (100 mg total) by mouth 2 (two) times daily.   doxercalciferol (HECTOROL) 4 MCG/2ML injection Inject 2.5 mLs (5 mcg total) into the vein Every Tuesday,Thursday,and Saturday with dialysis.   ferric citrate (AURYXIA) 1 GM 210 MG(Fe) tablet Take 1 tablet (210 mg total) by mouth 3 (three) times daily with meals.   fluticasone-salmeterol (ADVAIR HFA) 115-21 MCG/ACT inhaler  Inhale 1 puff into the lungs 2 (two) times daily.   lidocaine (LIDODERM) 5 % Place 2 patches onto the skin daily. Remove & Discard patch within 12 hours or as directed by MD   lidocaine-prilocaine (EMLA) cream Apply 1 application topically 3 (three) times a week.   Menthol-Methyl Salicylate (MUSCLE RUB) 10-15 % CREA Apply 1 application topically as needed for muscle pain.   multivitamin (RENA-VIT) TABS tablet Take 1 tablet by mouth at bedtime.   NIFEdipine  (PROCARDIA-XL/NIFEDICAL-XL) 30 MG 24 hr tablet Take 2 tablets (60 mg total) by mouth at bedtime. (Patient taking differently: Take 30 mg by mouth in the morning and at bedtime.)   omeprazole (PRILOSEC) 20 MG capsule Take 2 capsules (40 mg total) by mouth daily.   Polyethyl Glycol-Propyl Glycol (SYSTANE OP) Place 1 drop into both eyes daily as needed (dry eyes).   polyethylene glycol (MIRALAX / GLYCOLAX) 17 g packet Take 17 g by mouth daily. (Patient taking differently: Take 17 g by mouth daily as needed for mild constipation.)   rOPINIRole (REQUIP) 0.5 MG tablet Take 0.5 mg by mouth at bedtime.   sertraline (ZOLOFT) 25 MG tablet Take 25 mg by mouth daily.   simvastatin (ZOCOR) 20 MG tablet Take 20 mg by mouth at bedtime.   traZODone (DESYREL) 50 MG tablet TAKE 1 TABLET(50 MG) BY MOUTH AT BEDTIME AS NEEDED FOR SLEEP (Patient taking differently: Take 50 mg by mouth at bedtime as needed for sleep.)     Allergies:   Penicillins, Prednisone, Scallops [shellfish allergy], and Tramadol   Social History   Socioeconomic History   Marital status: Married    Spouse name: Not on file   Number of children: Not on file   Years of education: Not on file   Highest education level: Not on file  Occupational History   Not on file  Tobacco Use   Smoking status: Never   Smokeless tobacco: Never  Vaping Use   Vaping Use: Never used  Substance and Sexual Activity   Alcohol use: Not Currently    Comment: occasional wine   Drug use: Never   Sexual activity: Not Currently  Other Topics Concern   Not on file  Social History Narrative   Not on file   Social Determinants of Health   Financial Resource Strain: Not on file  Food Insecurity: Not on file  Transportation Needs: Not on file  Physical Activity: Not on file  Stress: Not on file  Social Connections: Not on file     Family History: The patient's family history is not on file.  ROS:   Please see the history of present illness.    (+)  Loss of appetite (+) Weight loss All other systems reviewed and are negative.  EKGs/Labs/Other Studies Reviewed:    The following studies were reviewed today:  CTA Chest 07/04/2021: FINDINGS: Cardiovascular: There is adequate opacification of the pulmonary arteries to the subsegmental level. There is no evidence of pulmonary embolism. The heart is enlarged. There are mitral annular calcifications, mild aortic valve calcifications, and scattered coronary artery calcifications. There is mild calcified atherosclerotic plaque in the nonaneurysmal thoracic aorta. There is reflux of contrast into the IVC suggesting right heart dysfunction.   Mediastinum/Nodes: The thyroid is unremarkable. The esophagus is grossly unremarkable. There is no mediastinal, hilar, or axillary lymphadenopathy.   Lungs/Pleura: The endotracheal tube tip is approximally 6 mm from the carina and approaches the right main bronchus. The trachea and central airways are patent.  There is consolidation in the dependent lower lobes, right significantly worse than left. There are additional patchy opacities and interlobular septal thickening in the lung apices and left lung base. There are small bilateral pleural effusions. There is no pneumothorax.   Upper Abdomen: The enteric catheter tip is in the distal stomach. A right adrenal adenoma is unchanged there are no acute findings in the upper abdomen.   Musculoskeletal: There are acute fractures of the right fourth and sixth through eighth ribs and left sixth through ninth ribs.   Review of the MIP images confirms the above findings.   IMPRESSION: 1. No evidence of pulmonary embolism. 2. Findings above suggesting heart failure including cardiomegaly, patchy ground-glass opacities and interlobular septal thickening likely reflecting mild to moderate pulmonary interstitial edema, and small bilateral pleural effusions. Additionally, reflux of contrast into the  IVC suggests right heart dysfunction. 3. Consolidations in the bilateral lower lobes may reflect atelectasis, though pneumonia can not be excluded. 4. Multiple bilateral rib fractures likely related to CPR. 5. Endotracheal tube tip approaching the right main bronchus. Recommend retraction by approximately 1-2 cm.  Echo 07/04/2021:  1. Left ventricular ejection fraction, by estimation, is 50 to 55%. The  left ventricle has low normal function. The left ventricle has no regional  wall motion abnormalities. There is moderate concentric left ventricular  hypertrophy. Left ventricular  diastolic parameters are consistent with Grade I diastolic dysfunction  (impaired relaxation).   2. Right ventricular systolic function is normal. The right ventricular  size is normal. There is severely elevated pulmonary artery systolic  pressure. The estimated right ventricular systolic pressure is 84.1 mmHg.   3. Left atrial size was mildly dilated.   4. The mitral valve is degenerative. Trivial mitral valve regurgitation.  Moderate mitral annular calcification.   5. The aortic valve is tricuspid. There is mild calcification of the  aortic valve. There is mild thickening of the aortic valve. Aortic valve  regurgitation is not visualized. Mild aortic valve stenosis.   6. The inferior vena cava is dilated in size with <50% respiratory  variability, suggesting right atrial pressure of 15 mmHg.   EKG:   EKG is personally reviewed.  09/23/2021: Sinus rhythm   Recent Labs: 07/04/2021: B Natriuretic Peptide 1,308.5 07/08/2021: Magnesium 2.2 09/16/2021: ALT 9; BUN 42; Creatinine, Ser 9.31; Hemoglobin 9.7; Platelets 281; Potassium 4.9; Sodium 140   Recent Lipid Panel    Component Value Date/Time   CHOL 139 07/10/2021 0418   TRIG 92 07/10/2021 0418   HDL 48 07/10/2021 0418   CHOLHDL 2.9 07/10/2021 0418   VLDL 18 07/10/2021 0418   LDLCALC 73 07/10/2021 0418    Physical Exam:    VS:  BP 130/80    Pulse  79    Ht 5\' 6"  (1.676 m)    Wt 131 lb 9.6 oz (59.7 kg)    SpO2 99%    BMI 21.24 kg/m     Wt Readings from Last 3 Encounters:  09/23/21 131 lb 9.6 oz (59.7 kg)  09/16/21 129 lb (58.5 kg)  07/26/21 132 lb 7.9 oz (60.1 kg)     GEN: Well nourished, well developed in no acute distress.  Appears younger than stated age 41: Normal NECK: No JVD; No carotid bruits LYMPHATICS: No lymphadenopathy CARDIAC: RRR, 3/6 holosystolic murmur,  No rubs, gallops RESPIRATORY:  Clear to auscultation without rales, wheezing or rhonchi  ABDOMEN: Soft, non-tender, non-distended MUSCULOSKELETAL:  No edema; No deformity  SKIN: Warm and dry, AV fistula LUE  with palpable thrill NEUROLOGIC:  Alert and oriented x 3 PSYCHIATRIC:  Normal affect       ASSESSMENT:    1. Cryptogenic stroke (HCC)    PLAN:    In order of problems listed above:  #Cryptogenic stroke Discussed the pathophysiology of cryptogenic stroke during today's clinic appointment.  No evidence of atrial fibrillation thus far but the patient has not worn a heart monitor.  We will start with a 2-week ZIO monitor to further assess her heart rhythms.  If this is negative, discussed proceeding with loop recorder implant with the patient and her daughter who is with her today.  We will follow-up based on the results from the ZIO monitor.  Total time spent with patient today 45 minutes. This includes reviewing records, evaluating the patient and coordinating care.  Medication Adjustments/Labs and Tests Ordered: Current medicines are reviewed at length with the patient today.  Concerns regarding medicines are outlined above.  Orders Placed This Encounter  Procedures   EKG 12-Lead   No orders of the defined types were placed in this encounter.   I,Mathew Stumpf,acting as a Education administrator for Vickie Epley, MD.,have documented all relevant documentation on the behalf of Vickie Epley, MD,as directed by  Vickie Epley, MD while in the presence  of Vickie Epley, MD.  I, Vickie Epley, MD, have reviewed all documentation for this visit. The documentation on 09/23/21 for the exam, diagnosis, procedures, and orders are all accurate and complete.   Signed, Hilton Cork. Quentin Ore, MD, [facility], [facility] 09/23/2021 8:48 PM    Electrophysiology [facility]

## 2021-09-23 NOTE — Progress Notes (Deleted)
No loop ?monitor

## 2021-09-23 NOTE — Progress Notes (Unsigned)
Enrolled for [employer] to mail a ZIO XT long term holter monitor to the patients address on file.  

## 2021-09-23 NOTE — Patient Instructions (Addendum)
Medication Instructions:  Your physician recommends that you continue on your current medications as directed. Please refer to the Current Medication list given to you today. *If you need a refill on your cardiac medications before your next appointment, please call your pharmacy*  Lab Work: None. If you have labs (blood work) drawn today and your tests are completely normal, you will receive your results only by: [facility] (if you have MyChart) OR A paper copy in the mail If you have any lab test that is abnormal or we need to change your treatment, we will call you to review the results.  Testing/Procedures: Your physician has recommended that you wear a heart monitor. Heart monitors are medical devices that record the hearts electrical activity. Doctors most often use these monitors to diagnose arrhythmias. Arrhythmias are problems with the speed or rhythm of the heartbeat. The monitor is a small, portable device. You can wear one while you do your normal daily activities. This is usually used to diagnose what is causing palpitations/syncope (passing out).   Follow-Up: At [facility], you and your health needs are our priority.  As part of our continuing mission to provide you with exceptional heart care, we have created designated Provider Care Teams.  These Care Teams include your primary Cardiologist (physician) and Advanced Practice Providers (APPs -  Physician Assistants and Nurse Practitioners) who all work together to provide you with the care you need, when you need it.  Your physician wants you to follow-up in: Follow up based on Heart monitor results.   We recommend signing up for the patient portal called "MyChart".  Sign up information is provided on this After Visit Summary.  MyChart is used to connect with patients for Virtual Visits (Telemedicine).  Patients are able to view lab/test results, encounter notes, upcoming appointments, etc.  Non-urgent messages can be sent  to your provider as well.   To learn more about what you can do with MyChart, go to [email or web address].    Any Other Special Instructions Will Be Listed Below (If Applicable).  ZIO XT- Long Term Monitor Instructions  Your physician has requested you wear a ZIO patch monitor for 14 days.  This is a single patch monitor. [employer] supplies one patch monitor per enrollment. Additional stickers are not available. Please do not apply patch if you will be having a Nuclear Stress Test,  Echocardiogram, Cardiac CT, MRI, or Chest Xray during the period you would be wearing the  monitor. The patch cannot be worn during these tests. You cannot remove and re-apply the  ZIO XT patch monitor.  Your ZIO patch monitor will be mailed 3 day USPS to your address on file. It may take 3-5 days  to receive your monitor after you have been enrolled.  Once you have received your monitor, please review the enclosed instructions. Your monitor  has already been registered assigning a specific monitor serial # to you.  Billing and Patient Assistance Program Information  We have supplied [employer] with any of your insurance information on file for billing purposes. [employer] offers a sliding scale Patient Assistance Program for patients that do not have  insurance, or whose insurance does not completely cover the cost of the ZIO monitor.  You must apply for the Patient Assistance Program to qualify for this discounted rate.  To apply, please call [employer] at [phone], select option 4, select option 2, ask to apply for  Patient Assistance Program. Theodore Demark will ask your household income, and how many  people  are in your household. They will quote your out-of-pocket cost based on that information.  [employer] will also be able to set up a 68-month, interest-free payment plan if needed.  Applying the monitor   Shave hair from upper left chest.  Hold abrader disc by orange tab. Rub abrader in 40 strokes over the upper  left chest as  indicated in your monitor instructions.  Clean area with 4 enclosed alcohol pads. Let dry.  Apply patch as indicated in monitor instructions. Patch will be placed under collarbone on left  side of chest with arrow pointing upward.  Rub patch adhesive wings for 2 minutes. Remove white label marked "1". Remove the white  label marked "2". Rub patch adhesive wings for 2 additional minutes.  While looking in a mirror, press and release button in center of patch. A small green light will  flash 3-4 times. This will be your only indicator that the monitor has been turned on.  Do not shower for the first 24 hours. You may shower after the first 24 hours.  Press the button if you feel a symptom. You will hear a small click. Record Date, Time and  Symptom in the Patient Logbook.  When you are ready to remove the patch, follow instructions on the last 2 pages of Patient  Logbook. Stick patch monitor onto the last page of Patient Logbook.  Place Patient Logbook in the blue and white box. Use locking tab on box and tape box closed  securely. The blue and white box has prepaid postage on it. Please place it in the mailbox as  soon as possible. Your physician should have your test results approximately 7 days after the  monitor has been mailed back to [facility].  Call [facility] at [phone] if you have questions regarding  your ZIO XT patch monitor. Call them immediately if you see an orange light blinking on your  monitor.  If your monitor falls off in less than 4 days, contact our Monitor department at [phone].  If your monitor becomes loose or falls off after 4 days call [employer] at [phone] for  suggestions on securing your monitor

## 2021-09-26 DIAGNOSIS — I639 Cerebral infarction, unspecified: Secondary | ICD-10-CM | POA: Diagnosis not present

## 2021-09-26 DIAGNOSIS — I4891 Unspecified atrial fibrillation: Secondary | ICD-10-CM | POA: Diagnosis not present

## 2021-10-02 ENCOUNTER — Telehealth: Payer: Self-pay | Admitting: *Deleted

## 2021-10-02 NOTE — Telephone Encounter (Signed)
This email is to notify you that the patient listed below had an early fall off issue where the patch fell off in the first 24 hours of wear. ? ?We have placed an order for a replacement device to be overnighted to the patient. We have placed a hold on the billing, so the patient is not charged for the first device. ? ?If you have any questions, please respond to this email, or call us at [phone].  ? ?Patient initials: L.S. ? ?Patient ID: [id number] ? ?SN: [id number] ? ?Ticket: [id number] ? ? Account: [place]  ? ?Thank you, ?Cecille Rubin ?[employer] Customer Care ?

## 2021-10-07 ENCOUNTER — Inpatient Hospital Stay: Payer: Medicare Other | Admitting: Adult Health

## 2021-10-07 NOTE — Progress Notes (Unsigned)
[facility] [address] [place]. [place] [address] [phone]       HOSPITAL FOLLOW UP NOTE  Ms. Shir Boer Date of Birth:  1933/01/28 Medical Record Number:  [id number]   Reason for Referral:  hospital stroke follow up    SUBJECTIVE:   CHIEF COMPLAINT:  No chief complaint on file.   HPI:   Ms. Alejandria Thurow is a 88 y.o. female with history of ESRD on hemodialysis, poorly controlled hypertension, diabetes myelitis type II, hyperlipidemia, COPD with chronic oxygen who presented on 07/04/2021 s/p cardiac arrest with ROSC after 15 min.  Patient was extubated 12/9.  Patient was noted to have intermittent aphasia on 12/11 and 12/12 with a stable CT head.  On the morning of 12/12 the patient's daughter was speaking with her on the phone and could not understand what she was saying.  MRI obtained which showed left MCA punctate infarcts that appear microembolic.  Dr. Erlinda Hong consulted for left MCA scattered several punctate infarcts, etiology unclear, possibly cardioembolic vs large vessel disease of left ICA stenosis.  MRI also showed old cortical and subcortical infarctions of the left frontal lobe and old lacunar infarctions in the right thalamus.  MRA showed moderate to severe narrowing of the left supraclinoid ICA, decreased perfusion left distal MCA branches and multifocal narrowing of R VA. Carotid Doppler showed right ICA 40 to 59% stenosis.  LE Doppler negative.  EF 50 to 55%.  Recommended loop recorder vs cardiac monitoring to rule out A-fib as potential stroke etiology.  LDL 73.  A1c 5.7.  Recommended DAPT for 3 months then aspirin alone and continuation of simvastatin 20 mg daily.  HTN stable on home meds.  Discharged to CIR on 07/16/2021 per therapy recommendations.        PERTINENT IMAGING/LABS  Per hospitalization 07/04/2021 Code Stroke- stable left frontal encephalomalacia MRI  approximately 10 punctate acute infarctions scattered within the left MCA  territory consistent with embolic infarctions.  Old cortical and subcortical infarctions on the left frontal lobe. Old lacunar infarction in the right thalamus MRA head moderate to severe narrowing in the left supraclinoid ICA. Decreased perfusion in the left distal MCA branches. Multifocal narrowing of the right vertebral artery, which appears occluded just proximal to the vertebrobasilar junction 2D Echo LVEF 50 to 55% LE venous doppler neg CUS right ICA 40-59% stenosis  LDL 73 HgbA1c 5.7       ROS:   14 system review of systems performed and negative with exception of ***  PMH:  Past Medical History:  Diagnosis Date   Chronic kidney disease    COPD (chronic obstructive pulmonary disease) ([place])    Diabetes mellitus without complication (HCC)    Hyperlipidemia    Hypertension     PSH:  Past Surgical History:  Procedure Laterality Date   ABDOMINAL HYSTERECTOMY     AV FISTULA PLACEMENT     IR REMOVAL TUN CV CATH W/O FL  09/07/2020    Social History:  Social History   Socioeconomic History   Marital status: Married    Spouse name: Not on file   Number of children: Not on file   Years of education: Not on file   Highest education level: Not on file  Occupational History   Not on file  Tobacco Use   Smoking status: Never   Smokeless tobacco: Never  Vaping Use   Vaping Use: Never used  Substance and Sexual Activity   Alcohol use: Not Currently    Comment:  occasional wine   Drug use: Never   Sexual activity: Not Currently  Other Topics Concern   Not on file  Social History Narrative   Not on file   Social Determinants of Health   Financial Resource Strain: Not on file  Food Insecurity: Not on file  Transportation Needs: Not on file  Physical Activity: Not on file  Stress: Not on file  Social Connections: Not on file  Intimate Partner Violence: Not on file    Family History: No family history on file.  Medications:   Current Outpatient Medications on  File Prior to Visit  Medication Sig Dispense Refill   acetaminophen (TYLENOL) 500 MG tablet Take 2 tablets (1,000 mg total) by mouth 3 (three) times daily. For 3 to 5 days and then can consider changing it to as needed based on improvement of her musculoskeletal chest pain related to rib fractures. (Patient taking differently: Take 1,000 mg by mouth every 8 (eight) hours as needed for mild pain.)     albuterol (PROVENTIL) (2.5 MG/3ML) 0.083% nebulizer solution Inhale 2.5 mg into the lungs every 4 (four) hours as needed for wheezing or shortness of breath.     albuterol (VENTOLIN HFA) 108 (90 Base) MCG/ACT inhaler Inhale 2 puffs into the lungs every 4 (four) hours as needed for shortness of breath.     carvedilol (COREG) 25 MG tablet Take 1 tablet (25 mg total) by mouth See admin instructions. Take 25 mg twice daily on nondailysis days (Sun, Mon, Wed, and Fri) (Patient taking differently: Take 25 mg by mouth in the morning and at bedtime.) 32 tablet 0   cinacalcet (SENSIPAR) 30 MG tablet Take 1 tablet (30 mg total) by mouth Every Tuesday,Thursday,and Saturday with dialysis. 60 tablet 0   cloNIDine (CATAPRES) 0.1 MG tablet Take 1 tablet (0.1 mg total) by mouth 2 (two) times daily. 60 tablet 11   clopidogrel (PLAVIX) 75 MG tablet Take 1 tablet (75 mg total) by mouth daily. 30 tablet 0   Darbepoetin Alfa (ARANESP) 60 MCG/0.3ML SOSY injection Inject 0.3 mLs (60 mcg total) into the skin every Thursday at 6pm. 4.2 mL    docusate sodium (COLACE) 100 MG capsule Take 1 capsule (100 mg total) by mouth 2 (two) times daily.     doxercalciferol (HECTOROL) 4 MCG/2ML injection Inject 2.5 mLs (5 mcg total) into the vein Every Tuesday,Thursday,and Saturday with dialysis. 2 mL    ferric citrate (AURYXIA) 1 GM 210 MG(Fe) tablet Take 1 tablet (210 mg total) by mouth 3 (three) times daily with meals. 90 tablet 0   fluticasone-salmeterol (ADVAIR HFA) 115-21 MCG/ACT inhaler Inhale 1 puff into the lungs 2 (two) times daily. 1  each 12   lidocaine (LIDODERM) 5 % Place 2 patches onto the skin daily. Remove & Discard patch within 12 hours or as directed by MD 30 patch 0   lidocaine-prilocaine (EMLA) cream Apply 1 application topically 3 (three) times a week.     Menthol-Methyl Salicylate (MUSCLE RUB) 10-15 % CREA Apply 1 application topically as needed for muscle pain.     multivitamin (RENA-VIT) TABS tablet Take 1 tablet by mouth at bedtime. 30 tablet 0   NIFEdipine (PROCARDIA-XL/NIFEDICAL-XL) 30 MG 24 hr tablet Take 2 tablets (60 mg total) by mouth at bedtime. (Patient taking differently: Take 30 mg by mouth in the morning and at bedtime.) 60 tablet 0   omeprazole (PRILOSEC) 20 MG capsule Take 2 capsules (40 mg total) by mouth daily. 30 capsule 0   Polyethyl  Glycol-Propyl Glycol (SYSTANE OP) Place 1 drop into both eyes daily as needed (dry eyes).     polyethylene glycol (MIRALAX / GLYCOLAX) 17 g packet Take 17 g by mouth daily. (Patient taking differently: Take 17 g by mouth daily as needed for mild constipation.) 14 each 0   rOPINIRole (REQUIP) 0.5 MG tablet Take 0.5 mg by mouth at bedtime.     sertraline (ZOLOFT) 25 MG tablet Take 25 mg by mouth daily.     simvastatin (ZOCOR) 20 MG tablet Take 20 mg by mouth at bedtime.     traZODone (DESYREL) 50 MG tablet TAKE 1 TABLET(50 MG) BY MOUTH AT BEDTIME AS NEEDED FOR SLEEP (Patient taking differently: Take 50 mg by mouth at bedtime as needed for sleep.) 30 tablet 0   No current facility-administered medications on file prior to visit.    Allergies:   Allergies  Allergen Reactions   Penicillins Rash   Prednisone Other (See Comments)    insomnia     Scallops [Shellfish Allergy]     Threw up blood Scallops only - can eat other shellfish   Tramadol Anxiety      OBJECTIVE:  Physical Exam  There were no vitals filed for this visit. There is no height or weight on file to calculate BMI. No results found.  No flowsheet data found.   General: well developed,  well nourished, seated, in no evident distress Head: head normocephalic and atraumatic.   Neck: supple with no carotid or supraclavicular bruits Cardiovascular: regular rate and rhythm, no murmurs Musculoskeletal: no deformity Skin:  no rash/petichiae Vascular:  Normal pulses all extremities   Neurologic Exam Mental Status: Awake and fully alert. Oriented to place and time. Recent and remote memory intact. Attention span, concentration and fund of knowledge appropriate. Mood and affect appropriate.  Cranial Nerves: Fundoscopic exam reveals sharp disc margins. Pupils equal, briskly reactive to light. Extraocular movements full without nystagmus. Visual fields full to confrontation. Hearing intact. Facial sensation intact. Face, tongue, palate moves normally and symmetrically.  Motor: Normal bulk and tone. Normal strength in all tested extremity muscles Sensory.: intact to touch , pinprick , position and vibratory sensation.  Coordination: Rapid alternating movements normal in all extremities. Finger-to-nose and heel-to-shin performed accurately bilaterally. Gait and Station: Arises from chair without difficulty. Stance is normal. Gait demonstrates normal stride length and balance with ***. Tandem walk and heel toe ***.  Reflexes: 1+ and symmetric. Toes downgoing.     NIHSS  *** Modified [place]  ***      ASSESSMENT: Masiah Sahni is a 88 y.o. year old female with left MCA scattered infarcts secondary to unclear etiology on 07/04/2021 found after presenting with cardiac arrest, infarcts possibly embolic vs large vessel disease with ***. Vascular risk factors include intracranial stenosis, HTN, HLD, prior strokes on imaging, right carotid stenosis and advanced age.      PLAN:  *** : Residual deficit: ***. Continue {anticoagulants:31417}  and ***  for secondary stroke prevention.  Discussed secondary stroke prevention measures and importance of close PCP follow up for aggressive stroke  risk factor management. I have gone over the pathophysiology of stroke, warning signs and symptoms, risk factors and their management in some detail with instructions to go to the closest emergency room for symptoms of concern. HTN: BP goal <130/90.  Stable on *** per PCP HLD: LDL goal <70. Recent LDL ***.  DMII: A1c goal<7.0. Recent A1c ***.     Follow up in *** or call earlier if  needed   CC:  [facility] provider: Dr. Leonie Man PCP: Mindi Curling, PA-C    I spent *** minutes of face-to-face and non-face-to-face time with patient.  This included previsit chart review including review of recent hospitalization, lab review, study review, order entry, electronic health record documentation, patient education regarding recent stroke including etiology, secondary stroke prevention measures and importance of managing stroke risk factors, residual deficits and typical recovery time and answered all other questions to patient satisfaction   Frann Rider, AGNP-BC  [facility] [address] [place], [place] [address]  Phone [phone] Fax [phone] Note: This document was prepared with digital dictation and possible smart phrase technology. Any transcriptional errors that result from this process are unintentional.

## 2021-10-10 ENCOUNTER — Other Ambulatory Visit: Payer: Self-pay | Admitting: Registered Nurse

## 2021-10-18 ENCOUNTER — Emergency Department (HOSPITAL_COMMUNITY): Payer: Medicare Other

## 2021-10-18 ENCOUNTER — Encounter (HOSPITAL_COMMUNITY): Payer: Self-pay | Admitting: Emergency Medicine

## 2021-10-18 ENCOUNTER — Inpatient Hospital Stay (HOSPITAL_COMMUNITY)
Admission: EM | Admit: 2021-10-18 | Discharge: 2021-10-22 | DRG: 189 | Disposition: A | Discharge status: Home / Self Care | Payer: Medicare Other | Attending: Internal Medicine | Admitting: Internal Medicine

## 2021-10-18 DIAGNOSIS — J9601 Acute respiratory failure with hypoxia: Secondary | ICD-10-CM | POA: Diagnosis present

## 2021-10-18 DIAGNOSIS — Z20822 Contact with and (suspected) exposure to covid-19: Secondary | ICD-10-CM | POA: Diagnosis present

## 2021-10-18 DIAGNOSIS — I248 Other forms of acute ischemic heart disease: Secondary | ICD-10-CM | POA: Diagnosis present

## 2021-10-18 DIAGNOSIS — N186 End stage renal disease: Secondary | ICD-10-CM | POA: Diagnosis present

## 2021-10-18 DIAGNOSIS — D631 Anemia in chronic kidney disease: Secondary | ICD-10-CM | POA: Diagnosis present

## 2021-10-18 DIAGNOSIS — G9340 Encephalopathy, unspecified: Secondary | ICD-10-CM | POA: Diagnosis not present

## 2021-10-18 DIAGNOSIS — G9349 Other encephalopathy: Secondary | ICD-10-CM | POA: Diagnosis present

## 2021-10-18 DIAGNOSIS — E860 Dehydration: Secondary | ICD-10-CM | POA: Diagnosis present

## 2021-10-18 DIAGNOSIS — I350 Nonrheumatic aortic (valve) stenosis: Secondary | ICD-10-CM | POA: Diagnosis present

## 2021-10-18 DIAGNOSIS — J441 Chronic obstructive pulmonary disease with (acute) exacerbation: Secondary | ICD-10-CM | POA: Diagnosis present

## 2021-10-18 DIAGNOSIS — E8729 Other acidosis: Secondary | ICD-10-CM | POA: Diagnosis present

## 2021-10-18 DIAGNOSIS — Z91013 Allergy to seafood: Secondary | ICD-10-CM

## 2021-10-18 DIAGNOSIS — M898X9 Other specified disorders of bone, unspecified site: Secondary | ICD-10-CM | POA: Diagnosis present

## 2021-10-18 DIAGNOSIS — Z880 Allergy status to penicillin: Secondary | ICD-10-CM | POA: Diagnosis not present

## 2021-10-18 DIAGNOSIS — R531 Weakness: Secondary | ICD-10-CM

## 2021-10-18 DIAGNOSIS — Z862 Personal history of diseases of the blood and blood-forming organs and certain disorders involving the immune mechanism: Secondary | ICD-10-CM

## 2021-10-18 DIAGNOSIS — J9811 Atelectasis: Secondary | ICD-10-CM | POA: Diagnosis present

## 2021-10-18 DIAGNOSIS — N189 Chronic kidney disease, unspecified: Secondary | ICD-10-CM | POA: Diagnosis not present

## 2021-10-18 DIAGNOSIS — E1122 Type 2 diabetes mellitus with diabetic chronic kidney disease: Secondary | ICD-10-CM | POA: Diagnosis present

## 2021-10-18 DIAGNOSIS — E785 Hyperlipidemia, unspecified: Secondary | ICD-10-CM | POA: Diagnosis present

## 2021-10-18 DIAGNOSIS — Z992 Dependence on renal dialysis: Secondary | ICD-10-CM | POA: Diagnosis not present

## 2021-10-18 DIAGNOSIS — J9602 Acute respiratory failure with hypercapnia: Secondary | ICD-10-CM | POA: Diagnosis present

## 2021-10-18 DIAGNOSIS — Z888 Allergy status to other drugs, medicaments and biological substances status: Secondary | ICD-10-CM

## 2021-10-18 DIAGNOSIS — Z8673 Personal history of transient ischemic attack (TIA), and cerebral infarction without residual deficits: Secondary | ICD-10-CM

## 2021-10-18 DIAGNOSIS — I5032 Chronic diastolic (congestive) heart failure: Secondary | ICD-10-CM | POA: Diagnosis present

## 2021-10-18 DIAGNOSIS — E872 Acidosis, unspecified: Secondary | ICD-10-CM | POA: Diagnosis not present

## 2021-10-18 DIAGNOSIS — E119 Type 2 diabetes mellitus without complications: Secondary | ICD-10-CM

## 2021-10-18 DIAGNOSIS — I132 Hypertensive heart and chronic kidney disease with heart failure and with stage 5 chronic kidney disease, or end stage renal disease: Secondary | ICD-10-CM | POA: Diagnosis present

## 2021-10-18 DIAGNOSIS — Z7902 Long term (current) use of antithrombotics/antiplatelets: Secondary | ICD-10-CM

## 2021-10-18 DIAGNOSIS — Z90710 Acquired absence of both cervix and uterus: Secondary | ICD-10-CM

## 2021-10-18 DIAGNOSIS — Z79899 Other long term (current) drug therapy: Secondary | ICD-10-CM

## 2021-10-18 DIAGNOSIS — R778 Other specified abnormalities of plasma proteins: Secondary | ICD-10-CM | POA: Diagnosis not present

## 2021-10-18 LAB — COMPREHENSIVE METABOLIC PANEL
ALT: 13 U/L (ref 0–44)
AST: 24 U/L (ref 15–41)
Albumin: 3 g/dL — ABNORMAL LOW (ref 3.5–5.0)
Alkaline Phosphatase: 99 U/L (ref 38–126)
Anion gap: 11 (ref 5–15)
BUN: 44 mg/dL — ABNORMAL HIGH (ref 8–23)
CO2: 26 mmol/L (ref 22–32)
Calcium: 8.6 mg/dL — ABNORMAL LOW (ref 8.9–10.3)
Chloride: 101 mmol/L (ref 98–111)
Creatinine, Ser: 5.87 mg/dL — ABNORMAL HIGH (ref 0.44–1.00)
GFR, Estimated: 7 mL/min — ABNORMAL LOW (ref >60)
Glucose, Bld: 271 mg/dL — ABNORMAL HIGH (ref 70–99)
Potassium: 3.8 mmol/L (ref 3.5–5.1)
Sodium: 138 mmol/L (ref 135–145)
Total Bilirubin: 0.6 mg/dL (ref 0.3–1.2)
Total Protein: 7.1 g/dL (ref 6.5–8.1)

## 2021-10-18 LAB — CBC WITH DIFFERENTIAL/PLATELET
Abs Immature Granulocytes: 0.08 10*3/uL — ABNORMAL HIGH (ref 0.00–0.07)
Basophils Absolute: 0.1 10*3/uL (ref 0.0–0.1)
Basophils Relative: 0 %
Eosinophils Absolute: 0.2 10*3/uL (ref 0.0–0.5)
Eosinophils Relative: 2 %
HCT: 34.1 % — ABNORMAL LOW (ref 36.0–46.0)
Hemoglobin: 10.3 g/dL — ABNORMAL LOW (ref 12.0–15.0)
Immature Granulocytes: 1 %
Lymphocytes Relative: 41 %
Lymphs Abs: 5.9 10*3/uL — ABNORMAL HIGH (ref 0.7–4.0)
MCH: 29.8 pg (ref 26.0–34.0)
MCHC: 30.2 g/dL (ref 30.0–36.0)
MCV: 98.6 fL (ref 80.0–100.0)
Monocytes Absolute: 1.6 10*3/uL — ABNORMAL HIGH (ref 0.1–1.0)
Monocytes Relative: 11 %
Neutro Abs: 6.6 10*3/uL (ref 1.7–7.7)
Neutrophils Relative %: 45 %
Platelets: 202 10*3/uL (ref 150–400)
RBC: 3.46 MIL/uL — ABNORMAL LOW (ref 3.87–5.11)
RDW: 16.2 % — ABNORMAL HIGH (ref 11.5–15.5)
WBC: 14.5 10*3/uL — ABNORMAL HIGH (ref 4.0–10.5)
nRBC: 0.3 % — ABNORMAL HIGH (ref 0.0–0.2)

## 2021-10-18 LAB — I-STAT VENOUS BLOOD GAS, ED
Acid-Base Excess: 7 mmol/L — ABNORMAL HIGH (ref 0.0–2.0)
Bicarbonate: 32.7 mmol/L — ABNORMAL HIGH (ref 20.0–28.0)
Calcium, Ion: 1.11 mmol/L — ABNORMAL LOW (ref 1.15–1.40)
HCT: 29 % — ABNORMAL LOW (ref 36.0–46.0)
Hemoglobin: 9.9 g/dL — ABNORMAL LOW (ref 12.0–15.0)
O2 Saturation: 99 %
Potassium: 4.5 mmol/L (ref 3.5–5.1)
Sodium: 139 mmol/L (ref 135–145)
TCO2: 34 mmol/L — ABNORMAL HIGH (ref 22–32)
pCO2, Ven: 53.7 mmHg (ref 44–60)
pH, Ven: 7.392 (ref 7.25–7.43)
pO2, Ven: 128 mmHg — ABNORMAL HIGH (ref 32–45)

## 2021-10-18 LAB — I-STAT ARTERIAL BLOOD GAS, ED
Acid-Base Excess: 1 mmol/L (ref 0.0–2.0)
Bicarbonate: 32 mmol/L — ABNORMAL HIGH (ref 20.0–28.0)
Calcium, Ion: 1.21 mmol/L (ref 1.15–1.40)
HCT: 32 % — ABNORMAL LOW (ref 36.0–46.0)
Hemoglobin: 10.9 g/dL — ABNORMAL LOW (ref 12.0–15.0)
O2 Saturation: 99 %
Patient temperature: 97.2
Potassium: 4.1 mmol/L (ref 3.5–5.1)
Sodium: 140 mmol/L (ref 135–145)
TCO2: 35 mmol/L — ABNORMAL HIGH (ref 22–32)
pCO2 arterial: 87.3 mmHg (ref 32–48)
pH, Arterial: 7.168 — CL (ref 7.35–7.45)
pO2, Arterial: 155 mmHg — ABNORMAL HIGH (ref 83–108)

## 2021-10-18 LAB — I-STAT CHEM 8, ED
BUN: 46 mg/dL — ABNORMAL HIGH (ref 8–23)
Calcium, Ion: 0.99 mmol/L — ABNORMAL LOW (ref 1.15–1.40)
Chloride: 104 mmol/L (ref 98–111)
Creatinine, Ser: 5.8 mg/dL — ABNORMAL HIGH (ref 0.44–1.00)
Glucose, Bld: 274 mg/dL — ABNORMAL HIGH (ref 70–99)
HCT: 35 % — ABNORMAL LOW (ref 36.0–46.0)
Hemoglobin: 11.9 g/dL — ABNORMAL LOW (ref 12.0–15.0)
Potassium: 3.8 mmol/L (ref 3.5–5.1)
Sodium: 140 mmol/L (ref 135–145)
TCO2: 27 mmol/L (ref 22–32)

## 2021-10-18 LAB — BRAIN NATRIURETIC PEPTIDE: B Natriuretic Peptide: >4500 pg/mL — ABNORMAL HIGH (ref 0.0–100.0)

## 2021-10-18 LAB — CBG MONITORING, ED: Glucose-Capillary: 181 mg/dL — ABNORMAL HIGH (ref 70–99)

## 2021-10-18 LAB — RESP PANEL BY RT-PCR (FLU A&B, COVID) ARPGX2
Influenza A by PCR: NEGATIVE
Influenza B by PCR: NEGATIVE
SARS Coronavirus 2 by RT PCR: NEGATIVE

## 2021-10-18 LAB — TROPONIN I (HIGH SENSITIVITY)
Troponin I (High Sensitivity): 51 ng/L — ABNORMAL HIGH (ref <18)
Troponin I (High Sensitivity): 151 ng/L (ref <18)

## 2021-10-18 LAB — LACTIC ACID, PLASMA: Lactic Acid, Venous: 3.2 mmol/L (ref 0.5–1.9)

## 2021-10-18 IMAGING — DX DG CHEST 1V PORT
1 series · 1 of 1 positions shown · non-contrast
Comparison: [DATE]

CLINICAL DATA: Respiratory distress

EXAM:
PORTABLE CHEST 1 VIEW

[chest ap]
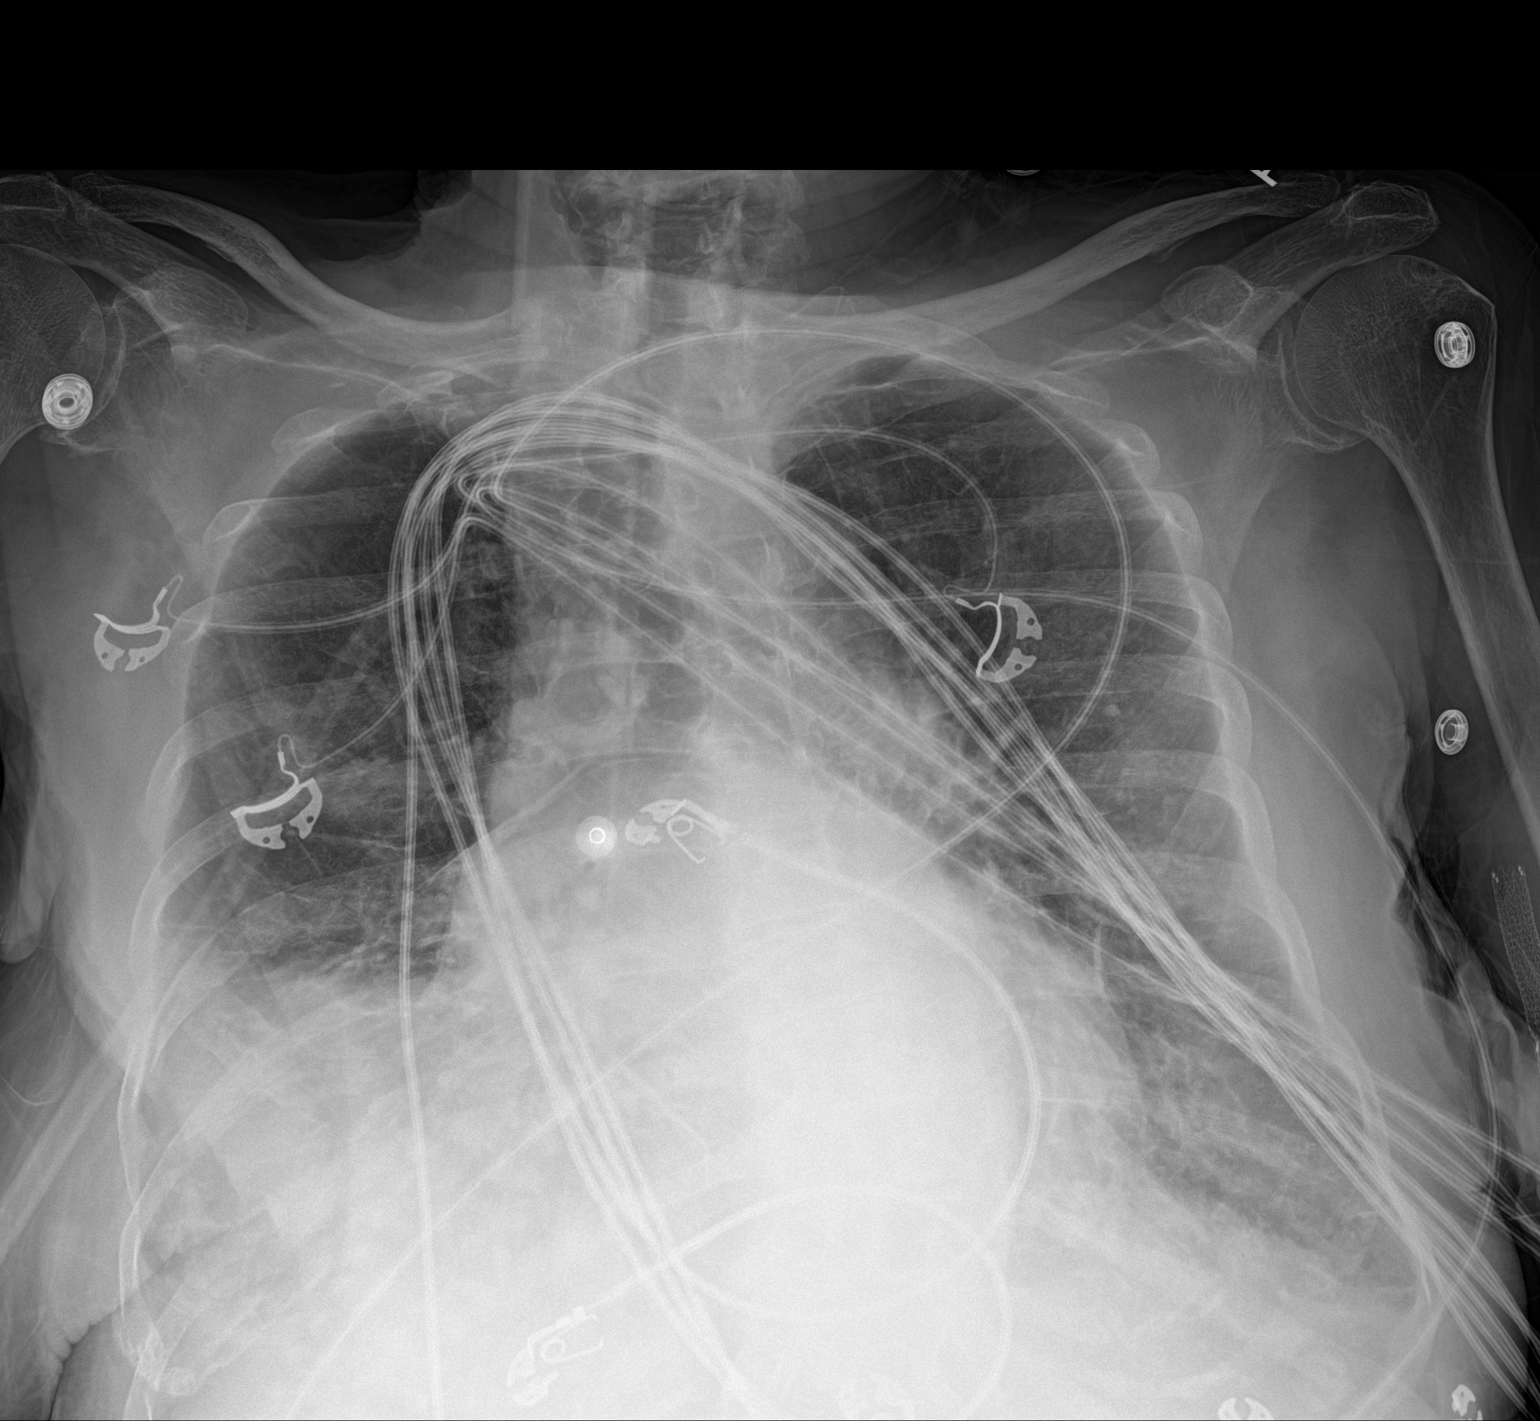

[1 of 1 positions shown; findings below may reference images not displayed]

FINDINGS: Cardiac shadow is enlarged but stable. Aortic calcifications are
again seen. Lungs are well aerated bilaterally. Elevation of the
right hemidiaphragm is again noted with mild right basilar
atelectasis. No focal confluent infiltrate is seen. No acute bony
abnormality is noted. Old rib fractures are noted on the right.
IMPRESSION: Mild right basilar atelectasis.

## 2021-10-18 MED ORDER — FERRIC CITRATE 1 GM 210 MG(FE) PO TABS
210.0000 mg | ORAL_TABLET | Freq: Three times a day (TID) | ORAL | Status: DC
  Administered 2021-10-19 – 2021-10-21 (×8): 210 mg via ORAL
  Filled 2021-10-18 (×8): qty 1

## 2021-10-18 MED ORDER — ALBUTEROL SULFATE (2.5 MG/3ML) 0.083% IN NEBU: 2.5000 mg | INHALATION_SOLUTION | RESPIRATORY_TRACT | Status: DC | PRN

## 2021-10-18 MED ORDER — METHYLPREDNISOLONE SODIUM SUCC 125 MG IJ SOLR
80.0000 mg | Freq: Two times a day (BID) | INTRAMUSCULAR | Status: DC
  Administered 2021-10-19 – 2021-10-20 (×3): 80 mg via INTRAVENOUS
  Filled 2021-10-18 (×3): qty 2

## 2021-10-18 MED ORDER — METHYLPREDNISOLONE SODIUM SUCC 125 MG IJ SOLR
125.0000 mg | INTRAMUSCULAR | Status: DC
Start: 2021-10-18 — End: 2021-10-18
  Administered 2021-10-18: 125 mg via INTRAVENOUS
  Filled 2021-10-18 (×2): qty 2

## 2021-10-18 MED ORDER — INSULIN ASPART 100 UNIT/ML IJ SOLN
0.0000 [IU] | Freq: Four times a day (QID) | INTRAMUSCULAR | Status: DC
  Administered 2021-10-18: 1 [IU] via SUBCUTANEOUS

## 2021-10-18 MED ORDER — SODIUM CHLORIDE 0.9 % IV SOLN
500.0000 mg | INTRAVENOUS | Status: DC
  Administered 2021-10-18 – 2021-10-20 (×3): 500 mg via INTRAVENOUS
  Filled 2021-10-18 (×3): qty 5

## 2021-10-18 MED ORDER — SIMVASTATIN 20 MG PO TABS
20.0000 mg | ORAL_TABLET | Freq: Every day | ORAL | Status: DC
Start: 2021-10-18 — End: 2021-10-22
  Administered 2021-10-18 – 2021-10-21 (×4): 20 mg via ORAL
  Filled 2021-10-18 (×4): qty 1

## 2021-10-18 MED ORDER — CLOPIDOGREL BISULFATE 75 MG PO TABS
75.0000 mg | ORAL_TABLET | Freq: Every day | ORAL | Status: DC
  Administered 2021-10-19 – 2021-10-21 (×3): 75 mg via ORAL
  Filled 2021-10-18 (×3): qty 1

## 2021-10-18 MED ORDER — PANTOPRAZOLE SODIUM 40 MG IV SOLR
40.0000 mg | INTRAVENOUS | Status: DC
  Administered 2021-10-19: 40 mg via INTRAVENOUS
  Filled 2021-10-18: qty 10

## 2021-10-18 MED ORDER — IPRATROPIUM-ALBUTEROL 0.5-2.5 (3) MG/3ML IN SOLN
3.0000 mL | Freq: Once | RESPIRATORY_TRACT | Status: AC
  Administered 2021-10-18: 3 mL via RESPIRATORY_TRACT
  Filled 2021-10-18: qty 3

## 2021-10-18 MED ORDER — IPRATROPIUM-ALBUTEROL 0.5-2.5 (3) MG/3ML IN SOLN
3.0000 mL | Freq: Four times a day (QID) | RESPIRATORY_TRACT | Status: DC
  Administered 2021-10-18 – 2021-10-19 (×3): 3 mL via RESPIRATORY_TRACT
  Filled 2021-10-18 (×3): qty 3

## 2021-10-18 MED ORDER — ASPIRIN 325 MG PO TABS
325.0000 mg | ORAL_TABLET | Freq: Once | ORAL | Status: AC
  Administered 2021-10-18: 325 mg via ORAL
  Filled 2021-10-18: qty 1

## 2021-10-18 MED ORDER — ACETAMINOPHEN 325 MG PO TABS
650.0000 mg | ORAL_TABLET | Freq: Four times a day (QID) | ORAL | Status: DC | PRN
  Administered 2021-10-20 – 2021-10-21 (×2): 650 mg via ORAL
  Filled 2021-10-18 (×2): qty 2

## 2021-10-18 MED ORDER — ACETAMINOPHEN 650 MG RE SUPP: 650.0000 mg | Freq: Four times a day (QID) | RECTAL | Status: DC | PRN

## 2021-10-18 MED ORDER — MAGNESIUM SULFATE 2 GM/50ML IV SOLN
2.0000 g | Freq: Once | INTRAVENOUS | Status: AC
  Administered 2021-10-18: 2 g via INTRAVENOUS
  Filled 2021-10-18: qty 50

## 2021-10-18 NOTE — ED Triage Notes (Addendum)
Pt bib EMS for SOB from home. Family gave home nebulizer, pt experienced emesis following this. CPAP placed without relief, oxygen 70s on CPAP and 89% on BVM. Arrives with BVM.  T/Th/Sat dialysis pt, L arm restricted.  ?

## 2021-10-18 NOTE — ED Notes (Addendum)
Dr. Velia Meyer made aware of critical troponin 151. Per provider, plan to re-check troponin in AM and order echocardiogram ?

## 2021-10-18 NOTE — H&P (Signed)
To treat ?History and Physical  ? ? ?PLEASE NOTE THAT DRAGON DICTATION SOFTWARE WAS USED IN THE CONSTRUCTION OF THIS NOTE. ? ? ?Stacey Dorsey HRC:[id number] DOB: [birth year] DOA: 10/18/2021 ? ?PCP: Bartholome Bill, MD  ?Patient coming from: home  ? ?I have personally briefly reviewed patient's old medical records in [place] ? ?Chief Complaint: altered mental status ? ?HPI: Stacey Dorsey is a 88 y.o. female with medical history significant for end-stage renal disease on hemodialysis on Tuesday, Thursday, Saturday schedule, COPD, diabetes mellitus managed via lifestyle modifications, hypertension, hyperlipidemia, chronic diastolic heart failure, mild aortic stenosis, anemia of chronic kidney disease associated with baseline hemoglobin 9-11, who is admitted to [facility] on 10/18/2021 with acute hypoxic hypercapnic respiratory failure after presenting from home via ems to [facility] ED for evaluation of altered mental status.  ? ?The following history provided via my discussions with the patient as well as my discussions with the patient's daughter, is present at bedside, in addition to my discussions with the EDP and via chart review. ? ?The patient presented to [facility] emergency department via EMS for further evaluation of 1 to 2 days of progressive altered mental status.  Specifically, daughter is noted the patient to exhibit progressive confusion over the last 1 to 2 days, as well as increased somnolence over that timeframe.  The patient conveys that she has been experiencing new onset rhinitis, rhinorrhea, and productive cough over the last 2 days, but denies any overt associated shortness of breath.  She also denies any recent chest pain, palpitations, diaphoresis, nausea, vomiting, dizziness, presyncope, or syncope.  She also denies a associated subjective fever, chills, rigors, or generalized myalgias. ? ?Over the last 2 to 3 days she has noted generalized weakness, which has not associated with  any acute focal weakness, acute focal numbness, paresthesias, facial droop, slurred speech, expressive aphasia, acute change in vision, dysphagia, vertigo.  Denies any recent headache, neck stiffness, rash, abdominal pain, diarrhea. ? ?She acknowledges a history of end-stage renal disease on hemodialysis on Tuesday, Thursday, Saturday schedule.  She notes good compliance and attending all of her HD sessions, noting most recent session occurred in complete fashion on Thursday, 10/17/2021.  She also notes that she continues to produce a small amount of urine, and denies any recent dysuria or gross hematuria.  No recent trauma or travel.  Denies any recent worsening of peripheral edema, nor any recent calf tenderness or new lower extremity erythema. ? ?The patient has a history of COPD, but no known baseline supplemental oxygen requirements.  Reports good compliance with her home respiratory regimen which consists of scheduled Advair.  No recent wheezing.  ? ?Of note, most recent echocardiogram occurred on 07/04/2021 and was notable for LVEF 50 to 55%, no focal wall motion abnormalities, moderate concentric LVH, grade 1 diastolic dysfunction, normal right ventricular systolic function, trivial mitral vegetation, mild aortic stenosis associated with aortic valve area of 1.07 cm2 by VTI. ? ?She also completed recent Holter monitor evaluation as an outpatient. ? ?In the setting of further progression of her confusion, daughter contacted EMS, who, upon arrival found patient's oxygen saturations to be in the low 70s on room air, prompting initiation of nonrebreather in route to [facility] emergency department for further evaluation and management thereof. ? ?Per chart review, prior high-sensitivity troponin I values notable for the following: Most recent prior value of 22 on 09/16/2021, will also noting value 418 on 07/04/2021. ? ? ?ED Course:  ?Vital signs in the  ED were notable for the following: Afebrile; heart rate initially  109, subsequently decreasing into the low 90s; blood pressure initially 181/108, with most recent BP noted to be 148/64; initial respiratory rate in the high 20s, subsequently improving to the range of 18-19, initial oxygen saturation while on 10 L nonrebreather noted to be 100%.  However, upon arrival at [facility] ED, she was switched to BiPAP with 50% FiO2, with ensuing O2 sats noted to be in the range of 97 to 100%. ? ?Labs were notable for the following: Initial showed 7.168/87/155/32.  Following interval initiation of BiPAP, ensuing VBG notable for the following: 7.319/53.7.  This change in acid-base status was associated with improvement in the patient's mental status. ? ?CMP notable for the following: Potassium 3.8, bicarbonate 26, AG 11, glucose 271, liver enzymes within normal limits.  High-sensitivity troponin I initially noted to be 51.  CBC notable for white blood cell count 14,500 associated with 40% lymphocytes, compared to most recent prior value of 12,400 on 09/16/2021 hemoglobin 10.3 compared to most recent prior value of 9.7 on 09/16/2021.  Initial lactate 3.2.  COVID-19/influenza PCR negative. ? ?Imaging and additional notable ED work-up: EKG shows sinus tachycardia with PAC and heart rate of 109, normal intervals, nonspecific T wave inversion in lead III, no evidence of ST changes, including no evidence of ST elevation.  Chest x-ray shows mild right basilar atelectasis, but otherwise demonstrates no evidence of acute cardiopulmonary process, including no evidence of infiltrate, edema, effusion, or pneumothorax. ? ?While in the ED, the following were administered: Duo nebulizer treatment x1, Solu-Medrol 125 mg IV x1, magnesium sulfate 2 g IV over 2 hours x 1 dose. ? ?Subsequently, the patient was admitted for further evaluation and management of acute hypoxic hypercapnic respiratory failure after presenting for evaluation of acute encephalopathy, with additional presenting labs notable for lactic  acidosis as well as mildly elevated troponin. ? ? ? ? ?Review of Systems: As per HPI otherwise 10 point review of systems negative.  ? ?Past Medical History:  ?Diagnosis Date  ? Chronic kidney disease   ? COPD (chronic obstructive pulmonary disease) ([place])   ? Diabetes mellitus without complication ([place])   ? Hyperlipidemia   ? Hypertension   ? ? ?Past Surgical History:  ?Procedure Laterality Date  ? ABDOMINAL HYSTERECTOMY    ? AV FISTULA PLACEMENT    ? IR REMOVAL TUN CV CATH W/O FL  09/07/2020  ? ? ?Social History: ? reports that she has never smoked. She has never used smokeless tobacco. She reports that she does not currently use alcohol. She reports that she does not use drugs. ? ? ?Allergies  ?Allergen Reactions  ? Penicillins Rash  ? Prednisone Other (See Comments)  ?  insomnia ? ?  ? Scallops [Shellfish Allergy]   ?  Threw up blood ?Scallops only - can eat other shellfish  ? Tramadol Anxiety  ? ? ?History reviewed. No pertinent family history. ? ? ?Prior to Admission medications   ?Medication Sig Start Date End Date Taking? Authorizing Provider  ?acetaminophen (TYLENOL) 500 MG tablet Take 2 tablets (1,000 mg total) by mouth 3 (three) times daily. For 3 to 5 days and then can consider changing it to as needed based on improvement of her musculoskeletal chest pain related to rib fractures. ?Patient taking differently: Take 1,000 mg by mouth every 8 (eight) hours as needed for mild pain. 07/16/21   Hongalgi, Lenis Dickinson, MD  ?albuterol (PROVENTIL) (2.5 MG/3ML) 0.083% nebulizer solution Inhale  2.5 mg into the lungs every 4 (four) hours as needed for wheezing or shortness of breath. 02/16/20   [provider]  ?albuterol (VENTOLIN HFA) 108 (90 Base) MCG/ACT inhaler Inhale 2 puffs into the lungs every 4 (four) hours as needed for shortness of breath. 04/02/20   [provider]  ?carvedilol (COREG) 25 MG tablet Take 1 tablet (25 mg total) by mouth See admin instructions. Take 25 mg twice daily on nondailysis  days (Sun, Mon, Wed, and Fri) ?Patient taking differently: Take 25 mg by mouth in the morning and at bedtime. 07/26/21   Setzer, Edman Circle, PA-C  ?cinacalcet (SENSIPAR) 30 MG tablet Take 1 tablet (30 mg

## 2021-10-18 NOTE — ED Notes (Signed)
RN explained plan of care to pt. Pt acknowledged understanding of information  ?

## 2021-10-18 NOTE — ED Notes (Signed)
ED provider and respiratory therapist at bedside. Pt now more alert on non-rebreather following BVM. Able to state name and place ?

## 2021-10-18 NOTE — ED Notes (Signed)
ED provider made aware of lactic 3.2 ?

## 2021-10-18 NOTE — ED Provider Notes (Signed)
?Stacey Dorsey ?Provider Note ? ? ?CSN: [id number] ?Arrival date & time: 10/18/21  1915 ? ?  ? ?History ? ?No chief complaint on file. ? ? ?Stacey Dorsey is a 88 y.o. female with history of COPD and ESRD on HD via R AVF presenting for respiratory distress.  Family gave home nebulizer and patient had a vomiting right after.  Patient placed on CPAP without relief and became somnolent with oxygen saturations in the 70s.  Improved to 89% on BVM.  Patient being bagged if she arrived.  Patient is Tuesday/Thursday/Saturday dialysis.  EMS reports she completed her last full session of dialysis this week as scheduled.  Has a left AV fistula. ? ? ?HPI ? ?  ? ?Home Medications ?Prior to Admission medications   ?Medication Sig Start Date End Date Taking? Authorizing Provider  ?acetaminophen (TYLENOL) 500 MG tablet Take 2 tablets (1,000 mg total) by mouth 3 (three) times daily. For 3 to 5 days and then can consider changing it to as needed based on improvement of her musculoskeletal chest pain related to rib fractures. ?Patient taking differently: Take 1,000 mg by mouth every 8 (eight) hours as needed for mild pain. 07/16/21   Modena Jansky, MD  ?albuterol (PROVENTIL) (2.5 MG/3ML) 0.083% nebulizer solution Inhale 2.5 mg into the lungs every 4 (four) hours as needed for wheezing or shortness of breath. 02/16/20   [provider]  ?albuterol (VENTOLIN HFA) 108 (90 Base) MCG/ACT inhaler Inhale 2 puffs into the lungs every 4 (four) hours as needed for shortness of breath. 04/02/20   [provider]  ?carvedilol (COREG) 25 MG tablet Take 1 tablet (25 mg total) by mouth See admin instructions. Take 25 mg twice daily on nondailysis days (Sun, Mon, Wed, and Fri) ?Patient taking differently: Take 25 mg by mouth in the morning and at bedtime. 07/26/21   Setzer, Edman Circle, PA-C  ?cinacalcet (SENSIPAR) 30 MG tablet Take 1 tablet (30 mg total) by mouth Every Tuesday,Thursday,and Saturday  with dialysis. 07/27/21   Setzer, Edman Circle, PA-C  ?cloNIDine (CATAPRES) 0.1 MG tablet Take 1 tablet (0.1 mg total) by mouth 2 (two) times daily. 07/26/21   Setzer, Edman Circle, PA-C  ?clopidogrel (PLAVIX) 75 MG tablet Take 1 tablet (75 mg total) by mouth daily. 07/26/21   Setzer, Edman Circle, PA-C  ?Darbepoetin Alfa (ARANESP) 60 MCG/0.3ML SOSY injection Inject 0.3 mLs (60 mcg total) into the skin every Thursday at 6pm. 08/01/21   Setzer, Edman Circle, PA-C  ?docusate sodium (COLACE) 100 MG capsule Take 1 capsule (100 mg total) by mouth 2 (two) times daily. 07/16/21   Modena Jansky, MD  ?doxercalciferol (HECTOROL) 4 MCG/2ML injection Inject 2.5 mLs (5 mcg total) into the vein Every Tuesday,Thursday,and Saturday with dialysis. 07/27/21   Setzer, Edman Circle, PA-C  ?ferric citrate (AURYXIA) 1 GM 210 MG(Fe) tablet Take 1 tablet (210 mg total) by mouth 3 (three) times daily with meals. 07/26/21   Setzer, Edman Circle, PA-C  ?fluticasone-salmeterol (ADVAIR HFA) 115-21 MCG/ACT inhaler Inhale 1 puff into the lungs 2 (two) times daily. 07/26/21   Setzer, Edman Circle, PA-C  ?lidocaine (LIDODERM) 5 % Place 2 patches onto the skin daily. Remove & Discard patch within 12 hours or as directed by MD 07/26/21   Vaughan Basta, Edman Circle, PA-C  ?lidocaine-prilocaine (EMLA) cream Apply 1 application topically 3 (three) times a week. 04/05/21   [provider]  ?Menthol-Methyl Salicylate (MUSCLE RUB) 10-15 % CREA Apply 1 application topically as needed  for muscle pain.    [provider]  ?multivitamin (RENA-VIT) TABS tablet Take 1 tablet by mouth at bedtime. 07/26/21   Setzer, Edman Circle, PA-C  ?NIFEdipine (PROCARDIA-XL/NIFEDICAL-XL) 30 MG 24 hr tablet Take 2 tablets (60 mg total) by mouth at bedtime. ?Patient taking differently: Take 30 mg by mouth in the morning and at bedtime. 07/26/21   Setzer, Edman Circle, PA-C  ?omeprazole (PRILOSEC) 20 MG capsule Take 2 capsules (40 mg total) by mouth daily. 07/26/21   Setzer, Edman Circle, PA-C  ?Polyethyl  Glycol-Propyl Glycol (SYSTANE OP) Place 1 drop into both eyes daily as needed (dry eyes).    [provider]  ?polyethylene glycol (MIRALAX / GLYCOLAX) 17 g packet Take 17 g by mouth daily. ?Patient taking differently: Take 17 g by mouth daily as needed for mild constipation. 07/26/21   Setzer, Edman Circle, PA-C  ?rOPINIRole (REQUIP) 0.5 MG tablet Take 0.5 mg by mouth at bedtime. 05/03/21   [provider]  ?sertraline (ZOLOFT) 25 MG tablet Take 25 mg by mouth daily. 09/04/21   [provider]  ?simvastatin (ZOCOR) 20 MG tablet Take 20 mg by mouth at bedtime. 05/03/21   [provider]  ?traZODone (DESYREL) 50 MG tablet TAKE 1 TABLET(50 MG) BY MOUTH AT BEDTIME AS NEEDED FOR SLEEP ?Patient taking differently: Take 50 mg by mouth at bedtime as needed for sleep. 09/02/21   Bayard Hugger, NP  ?   ? ?Allergies    ?Penicillins, Prednisone, Scallops [shellfish allergy], and Tramadol   ? ?Review of Systems   ?Review of Systems  ?Unable to perform ROS: Mental status change  ? ?Physical Exam ?Updated Vital Signs ?BP (!) 171/83   Pulse 92   Temp (!) 97.2 ?F (36.2 ?C) (Temporal)   Resp (!) 22   SpO2 99%  ?Physical Exam ?Vitals and nursing note reviewed.  ?Constitutional:   ?   General: She is in acute distress.  ?   Appearance: She is ill-appearing.  ?Cardiovascular:  ?   Rate and Rhythm: Normal rate and regular rhythm.  ?Pulmonary:  ?   Effort: Respiratory distress present.  ?Musculoskeletal:     ?   General: No deformity.  ? ? ?ED Results / Procedures / Treatments   ?Labs ?(all labs ordered are listed, but only abnormal results are displayed) ?Labs Reviewed  ?BRAIN NATRIURETIC PEPTIDE - Abnormal; Notable for the following components:  ?    Result Value  ? B Natriuretic Peptide >4,500.0 (*)   ? All other components within normal limits  ?LACTIC ACID, PLASMA - Abnormal; Notable for the following components:  ? Lactic Acid, Venous 3.2 (*)   ? All other components within normal limits  ?CBC WITH  DIFFERENTIAL/PLATELET - Abnormal; Notable for the following components:  ? WBC 14.5 (*)   ? RBC 3.46 (*)   ? Hemoglobin 10.3 (*)   ? HCT 34.1 (*)   ? RDW 16.2 (*)   ? nRBC 0.3 (*)   ? Lymphs Abs 5.9 (*)   ? Monocytes Absolute 1.6 (*)   ? Abs Immature Granulocytes 0.08 (*)   ? All other components within normal limits  ?COMPREHENSIVE METABOLIC PANEL - Abnormal; Notable for the following components:  ? Glucose, Bld 271 (*)   ? BUN 44 (*)   ? Creatinine, Ser 5.87 (*)   ? Calcium 8.6 (*)   ? Albumin 3.0 (*)   ? GFR, Estimated 7 (*)   ? All other components within normal limits  ?I-STAT  ARTERIAL BLOOD GAS, ED - Abnormal; Notable for the following components:  ? pH, Arterial 7.168 (*)   ? pCO2 arterial 87.3 (*)   ? pO2, Arterial 155 (*)   ? Bicarbonate 32.0 (*)   ? TCO2 35 (*)   ? HCT 32.0 (*)   ? Hemoglobin 10.9 (*)   ? All other components within normal limits  ?I-STAT CHEM 8, ED - Abnormal; Notable for the following components:  ? BUN 46 (*)   ? Creatinine, Ser 5.80 (*)   ? Glucose, Bld 274 (*)   ? Calcium, Ion 0.99 (*)   ? Hemoglobin 11.9 (*)   ? HCT 35.0 (*)   ? All other components within normal limits  ?I-STAT VENOUS BLOOD GAS, ED - Abnormal; Notable for the following components:  ? pO2, Ven 128 (*)   ? Bicarbonate 32.7 (*)   ? TCO2 34 (*)   ? Acid-Base Excess 7.0 (*)   ? Calcium, Ion 1.11 (*)   ? HCT 29.0 (*)   ? Hemoglobin 9.9 (*)   ? All other components within normal limits  ?CBG MONITORING, ED - Abnormal; Notable for the following components:  ? Glucose-Capillary 181 (*)   ? All other components within normal limits  ?TROPONIN I (HIGH SENSITIVITY) - Abnormal; Notable for the following components:  ? Troponin I (High Sensitivity) 51 (*)   ? All other components within normal limits  ?TROPONIN I (HIGH SENSITIVITY) - Abnormal; Notable for the following components:  ? Troponin I (High Sensitivity) 151 (*)   ? All other components within normal limits  ?RESP PANEL BY RT-PCR (FLU A&B, COVID) ARPGX2  ?RESPIRATORY  PANEL BY PCR  ?LACTIC ACID, PLASMA  ?PHOSPHORUS  ?MAGNESIUM  ?MAGNESIUM  ?COMPREHENSIVE METABOLIC PANEL  ?CBC WITH DIFFERENTIAL/PLATELET  ?PROCALCITONIN  ?BLOOD GAS, VENOUS  ?URINALYSIS, COMPLETE (UACMP) WITH MICR

## 2021-10-18 NOTE — ED Notes (Signed)
Per ED provider, pt weaned off BiPAP by respiratory therapist and placed on 4 liters oxygen via nasal cannula. Oxygen saturation 98% on 4L.  ?

## 2021-10-18 NOTE — ED Notes (Signed)
Pt placed onto BiPAP by respiratory therapist ?

## 2021-10-18 NOTE — ED Provider Notes (Signed)
Shared resident visit.  Patient with history of COPD and end-stage renal disease on hemodialysis.  Patient presents with shortness of breath.  Was placed on BiPAP with EMS but was too somnolent and they bagged valve ventilated her.  She arrives here more awake and alert.  She has diminished breath sounds throughout.  She had dialysis yesterday.  She denies any chest pain.  She has no major edema.  Overall she has increased work of breathing.  EKG shows sinus rhythm.  No ischemic changes.  Overall differential diagnosis is likely COPD exacerbation versus less likely volume overload from end-stage renal disease.  Will place patient on BiPAP and give albuterol and Atrovent and Solu-Medrol magnesium.  Will obtain CBC, blood gas, troponin, BNP, chest x-ray. ? ?Per review and interpretation of the chest x-ray I do not see any pneumonia or pneumothorax.  Her blood gas shows a pH of 7.1 with a CO2 of 80.  Overall suggest COPD exacerbation.  Troponin is 51, BNP greater than 4500.  No significant anemia or leukocytosis otherwise.  Patient improving on BiPAP.  Recheck blood gas after 1 hour on BiPAP and pH has improved to 7.3, CO2 is 50.  We have titrated her down to nonrebreather.  We will admit her to medicine for further COPD exacerbation care.  Hemodynamically stable throughout her care. Viral testing is negative. ? ?.Critical Care ?Performed by: Lennice Sites, DO ?Authorized by: Lennice Sites, DO  ? ?Critical care provider statement:  ?  Critical care time (minutes):  35 ?  Critical care was necessary to treat or prevent imminent or life-threatening deterioration of the following conditions:  Respiratory failure ?  Critical care was time spent personally by me on the following activities:  Blood draw for specimens, development of treatment plan with patient or surrogate, discussions with primary provider, evaluation of patient's response to treatment, examination of patient, obtaining history from patient or surrogate,  ordering and performing treatments and interventions, ordering and review of laboratory studies, ordering and review of radiographic studies, pulse oximetry, re-evaluation of patient's condition and review of old charts ?  Care discussed with: admitting provider   ? ? ? ?  ?Lennice Sites, DO ?10/19/21 0004 ? ?

## 2021-10-18 NOTE — ED Notes (Signed)
Pt bowel incontinent in disposable brief. Pt cleaned by ED staff, changed into clean brief, and repositioned in bed.  ?

## 2021-10-18 NOTE — ED Notes (Signed)
Pt bowel incontinent in her clothing from home. Pt cleaned by ED staff, placed into disposable brief, and bed linens changed.  ?

## 2021-10-18 NOTE — ED Notes (Signed)
RN spoke with pt's family on the phone for update on pt. Answered all of family's questions.  ?

## 2021-10-19 ENCOUNTER — Other Ambulatory Visit: Payer: Self-pay

## 2021-10-19 ENCOUNTER — Inpatient Hospital Stay (HOSPITAL_COMMUNITY): Payer: Medicare Other

## 2021-10-19 DIAGNOSIS — G9340 Encephalopathy, unspecified: Secondary | ICD-10-CM | POA: Diagnosis present

## 2021-10-19 DIAGNOSIS — J9602 Acute respiratory failure with hypercapnia: Secondary | ICD-10-CM

## 2021-10-19 DIAGNOSIS — Z862 Personal history of diseases of the blood and blood-forming organs and certain disorders involving the immune mechanism: Secondary | ICD-10-CM

## 2021-10-19 DIAGNOSIS — E872 Acidosis, unspecified: Secondary | ICD-10-CM | POA: Diagnosis present

## 2021-10-19 DIAGNOSIS — E119 Type 2 diabetes mellitus without complications: Secondary | ICD-10-CM

## 2021-10-19 DIAGNOSIS — J9601 Acute respiratory failure with hypoxia: Secondary | ICD-10-CM | POA: Diagnosis not present

## 2021-10-19 DIAGNOSIS — I5032 Chronic diastolic (congestive) heart failure: Secondary | ICD-10-CM | POA: Diagnosis present

## 2021-10-19 DIAGNOSIS — E785 Hyperlipidemia, unspecified: Secondary | ICD-10-CM | POA: Diagnosis present

## 2021-10-19 DIAGNOSIS — R778 Other specified abnormalities of plasma proteins: Secondary | ICD-10-CM

## 2021-10-19 DIAGNOSIS — R531 Weakness: Secondary | ICD-10-CM

## 2021-10-19 DIAGNOSIS — N186 End stage renal disease: Secondary | ICD-10-CM | POA: Diagnosis present

## 2021-10-19 LAB — RESPIRATORY PANEL BY PCR

## 2021-10-19 LAB — PROTIME-INR
INR: 1 (ref 0.8–1.2)
Prothrombin Time: 13 seconds (ref 11.4–15.2)

## 2021-10-19 LAB — HEMOGLOBIN A1C
Hgb A1c MFr Bld: 6.1 % — ABNORMAL HIGH (ref 4.8–5.6)
Mean Plasma Glucose: 128.37 mg/dL

## 2021-10-19 LAB — ECHOCARDIOGRAM COMPLETE
AR max vel: 1.35 cm2
AV Area VTI: 1.38 cm2
AV Area mean vel: 1.29 cm2
AV Mean grad: 15 mmHg
AV Peak grad: 25.2 mmHg
Ao pk vel: 2.51 m/s
Area-P 1/2: 4.68 cm2
Height: 66 in
S' Lateral: 3.2 cm
Weight: 2151.69 oz

## 2021-10-19 LAB — BLOOD GAS, VENOUS
Acid-Base Excess: 6.6 mmol/L — ABNORMAL HIGH (ref 0.0–2.0)
Bicarbonate: 31.9 mmol/L — ABNORMAL HIGH (ref 20.0–28.0)
Drawn by: 6041
O2 Saturation: 92.6 %
Patient temperature: 36.4
pCO2, Ven: 46 mmHg (ref 44–60)
pH, Ven: 7.45 — ABNORMAL HIGH (ref 7.25–7.43)
pO2, Ven: 58 mmHg — ABNORMAL HIGH (ref 32–45)

## 2021-10-19 LAB — LACTIC ACID, PLASMA: Lactic Acid, Venous: 1.2 mmol/L (ref 0.5–1.9)

## 2021-10-19 LAB — CBC WITH DIFFERENTIAL/PLATELET
Abs Immature Granulocytes: 0.06 10*3/uL (ref 0.00–0.07)
Basophils Absolute: 0 10*3/uL (ref 0.0–0.1)
Basophils Relative: 0 %
Eosinophils Absolute: 0 10*3/uL (ref 0.0–0.5)
Eosinophils Relative: 0 %
HCT: 29.5 % — ABNORMAL LOW (ref 36.0–46.0)
Hemoglobin: 9 g/dL — ABNORMAL LOW (ref 12.0–15.0)
Immature Granulocytes: 1 %
Lymphocytes Relative: 7 %
Lymphs Abs: 0.7 10*3/uL (ref 0.7–4.0)
MCH: 29.1 pg (ref 26.0–34.0)
MCHC: 30.5 g/dL (ref 30.0–36.0)
MCV: 95.5 fL (ref 80.0–100.0)
Monocytes Absolute: 0.3 10*3/uL (ref 0.1–1.0)
Monocytes Relative: 3 %
Neutro Abs: 8.9 10*3/uL — ABNORMAL HIGH (ref 1.7–7.7)
Neutrophils Relative %: 89 %
Platelets: 187 10*3/uL (ref 150–400)
RBC: 3.09 MIL/uL — ABNORMAL LOW (ref 3.87–5.11)
RDW: 15.9 % — ABNORMAL HIGH (ref 11.5–15.5)
WBC: 9.9 10*3/uL (ref 4.0–10.5)
nRBC: 0 % (ref 0.0–0.2)

## 2021-10-19 LAB — POCT I-STAT EG7
Acid-Base Excess: 4 mmol/L — ABNORMAL HIGH (ref 0.0–2.0)
Bicarbonate: 30.6 mmol/L — ABNORMAL HIGH (ref 20.0–28.0)
Calcium, Ion: 2.27 mmol/L (ref 1.15–1.40)
HCT: 33 % — ABNORMAL LOW (ref 36.0–46.0)
Hemoglobin: 11.2 g/dL — ABNORMAL LOW (ref 12.0–15.0)
O2 Saturation: 97 %
Potassium: 4.6 mmol/L (ref 3.5–5.1)
Sodium: 141 mmol/L (ref 135–145)
TCO2: 32 mmol/L (ref 22–32)
pCO2, Ven: 52.3 mmHg (ref 44–60)
pH, Ven: 7.376 (ref 7.25–7.43)
pO2, Ven: 91 mmHg — ABNORMAL HIGH (ref 32–45)

## 2021-10-19 LAB — PHOSPHORUS: Phosphorus: 3.6 mg/dL (ref 2.5–4.6)

## 2021-10-19 LAB — SALICYLATE LEVEL: Salicylate Lvl: <7 mg/dL — ABNORMAL LOW (ref 7.0–30.0)

## 2021-10-19 LAB — COMPREHENSIVE METABOLIC PANEL
ALT: 16 U/L (ref 0–44)
AST: 23 U/L (ref 15–41)
Albumin: 2.8 g/dL — ABNORMAL LOW (ref 3.5–5.0)
Alkaline Phosphatase: 96 U/L (ref 38–126)
Anion gap: 11 (ref 5–15)
BUN: 48 mg/dL — ABNORMAL HIGH (ref 8–23)
CO2: 29 mmol/L (ref 22–32)
Calcium: 8.9 mg/dL (ref 8.9–10.3)
Chloride: 102 mmol/L (ref 98–111)
Creatinine, Ser: 6.1 mg/dL — ABNORMAL HIGH (ref 0.44–1.00)
GFR, Estimated: 6 mL/min — ABNORMAL LOW (ref >60)
Glucose, Bld: 154 mg/dL — ABNORMAL HIGH (ref 70–99)
Potassium: 4.6 mmol/L (ref 3.5–5.1)
Sodium: 142 mmol/L (ref 135–145)
Total Bilirubin: 0.6 mg/dL (ref 0.3–1.2)
Total Protein: 6.7 g/dL (ref 6.5–8.1)

## 2021-10-19 LAB — PROCALCITONIN: Procalcitonin: 6.51 ng/mL

## 2021-10-19 LAB — GLUCOSE, CAPILLARY
Glucose-Capillary: 156 mg/dL — ABNORMAL HIGH (ref 70–99)
Glucose-Capillary: 248 mg/dL — ABNORMAL HIGH (ref 70–99)
Glucose-Capillary: 232 mg/dL — ABNORMAL HIGH (ref 70–99)

## 2021-10-19 LAB — HEPATITIS B SURFACE ANTIBODY,QUALITATIVE: Hep B S Ab: REACTIVE — AB

## 2021-10-19 LAB — TSH: TSH: 0.359 u[IU]/mL (ref 0.350–4.500)

## 2021-10-19 LAB — MAGNESIUM: Magnesium: 2.4 mg/dL (ref 1.7–2.4)

## 2021-10-19 LAB — TROPONIN I (HIGH SENSITIVITY): Troponin I (High Sensitivity): 397 ng/L (ref <18)

## 2021-10-19 LAB — HEPATITIS B SURFACE ANTIGEN: Hepatitis B Surface Ag: NONREACTIVE

## 2021-10-19 MED ORDER — GUAIFENESIN-DM 100-10 MG/5ML PO SYRP
5.0000 mL | ORAL_SOLUTION | ORAL | Status: DC | PRN
  Administered 2021-10-21: 5 mL via ORAL
  Filled 2021-10-19: qty 5

## 2021-10-19 MED ORDER — IPRATROPIUM-ALBUTEROL 0.5-2.5 (3) MG/3ML IN SOLN
3.0000 mL | Freq: Three times a day (TID) | RESPIRATORY_TRACT | Status: DC
  Administered 2021-10-19 – 2021-10-20 (×4): 3 mL via RESPIRATORY_TRACT
  Filled 2021-10-19 (×4): qty 3

## 2021-10-19 MED ORDER — DOXERCALCIFEROL 4 MCG/2ML IV SOLN
5.0000 ug | INTRAVENOUS | Status: DC
  Filled 2021-10-19: qty 4

## 2021-10-19 MED ORDER — TRAZODONE HCL 50 MG PO TABS
50.0000 mg | ORAL_TABLET | Freq: Every day | ORAL | Status: DC
  Administered 2021-10-19 – 2021-10-21 (×3): 50 mg via ORAL
  Filled 2021-10-19 (×3): qty 1

## 2021-10-19 MED ORDER — INSULIN ASPART 100 UNIT/ML IJ SOLN
0.0000 [IU] | Freq: Three times a day (TID) | INTRAMUSCULAR | Status: DC
  Administered 2021-10-19: 1 [IU] via SUBCUTANEOUS
  Administered 2021-10-19: 2 [IU] via SUBCUTANEOUS
  Administered 2021-10-20: 4 [IU] via SUBCUTANEOUS
  Administered 2021-10-20 (×2): 2 [IU] via SUBCUTANEOUS

## 2021-10-19 MED ORDER — BENZONATATE 100 MG PO CAPS
200.0000 mg | ORAL_CAPSULE | Freq: Three times a day (TID) | ORAL | Status: DC
  Administered 2021-10-19 – 2021-10-21 (×8): 200 mg via ORAL
  Filled 2021-10-19 (×8): qty 2

## 2021-10-19 MED ORDER — CHLORHEXIDINE GLUCONATE CLOTH 2 % EX PADS
6.0000 | MEDICATED_PAD | Freq: Every day | CUTANEOUS | Status: DC
  Administered 2021-10-19 – 2021-10-21 (×3): 6 via TOPICAL

## 2021-10-19 MED ORDER — NIFEDIPINE ER OSMOTIC RELEASE 60 MG PO TB24
60.0000 mg | ORAL_TABLET | Freq: Every day | ORAL | Status: DC
Start: 2021-10-19 — End: 2021-10-22
  Administered 2021-10-19 – 2021-10-21 (×3): 60 mg via ORAL
  Filled 2021-10-19 (×3): qty 1

## 2021-10-19 MED ORDER — CINACALCET HCL 30 MG PO TABS
30.0000 mg | ORAL_TABLET | ORAL | Status: DC
Start: 2021-10-22 — End: 2021-10-22

## 2021-10-19 MED ORDER — CLONIDINE HCL 0.1 MG PO TABS
0.1000 mg | ORAL_TABLET | Freq: Two times a day (BID) | ORAL | Status: DC
  Administered 2021-10-19 – 2021-10-21 (×5): 0.1 mg via ORAL
  Filled 2021-10-19 (×5): qty 1

## 2021-10-19 NOTE — Assessment & Plan Note (Addendum)
-   Currently appears to be euvolemic.   ?-Mildly elevated troponins, 151-> 397 likely due to acute hypoxic/hypercapnic respiratory failure.   ?-2D echo showed EF of 55 to 60%, G1 DD, right ventricular overload mild MR ?-Volume management with hemodialysis, underwent HD on 3/25 ?

## 2021-10-19 NOTE — Assessment & Plan Note (Addendum)
-  On HD, TTS schedule.   ?-Underwent hemodialysis on 3/25, next HD on Tuesday ?

## 2021-10-19 NOTE — Assessment & Plan Note (Addendum)
-  Likely due to acute respiratory acidosis and hypoxia, dehydration.  No sepsis ?-Lactic acid improved to 1.2 ?

## 2021-10-19 NOTE — Hospital Course (Signed)
Patient is a 88 year old female with ESRD on HD TTS, COPD, diabetes mellitus, hypertension, hyperlipidemia, chronic diastolic CHF presented with acute hypoxic and hypercapnic respiratory failure.  EMS was called for altered mental status.  Per family, patient had 1 to 2 days of progressive altered mental status.,  Increased somnolence.  Patient has been experiencing new onset rhinitis, rhinorrhea and productive cough over the last 2 days.  She also reported generalized weakness. ? ?Patient was found to have acute respiratory acidosis with hypercapnic/hypoxic respiratory failure.  Initially placed on O2 10 L NRB by EMS, subsequently transitioned to BiPAP in ED. ? ? ?

## 2021-10-19 NOTE — Progress Notes (Signed)
? ? ? [facility] ?                                                                            ? ? 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 ?LOS - 1  days ? ? ? ?Brief summary  ? ?Patient is a 88 year old female with ESRD on HD TTS, COPD, diabetes mellitus, hypertension, hyperlipidemia, chronic diastolic CHF presented with acute hypoxic and hypercapnic respiratory failure.  EMS was called for altered mental status.  Per family, patient had 1 to 2 days of progressive altered mental status.,  Increased somnolence.  Patient has been experiencing new onset rhinitis, rhinorrhea and productive cough over the last 2 days.  She also reported generalized weakness. ? ?Patient was found to have acute respiratory acidosis with hypercapnic/hypoxic respiratory failure.  Initially placed on O2 10 L NRB by EMS, subsequently transitioned to BiPAP in ED. ? ? ? ? ?Assessment & Plan  ? ? ?Assessment and Plan: ?* Acute respiratory failure with hypoxia and hypercapnia (HCC) ?- Known history of COPD, initial O2 sats in 70s on room air, was placed on 10 L NRB.  ABG showed acute respiratory acidosis with pH of 7.1, PCO2 87.3 ?-Patient was placed on BiPAP, significant ABGs improved, pH 7.4, PCO2 46, alert and oriented on exam   ? -O2 sats improving, 98% on 2.5 L, weaned down from 4 L ?-Continue IV Zithromax, IV Solu-Medrol, scheduled DuoNebs, antitussives ? ?Acute encephalopathy ?- Likely due to hypercapnic respiratory failure, patient was placed on BiPAP ?-Resolved, currently alert and oriented x3  ? ? ?ESRD (end stage renal disease) ([place]) ?-On HD, TTS schedule.   ?-Nephrology notified. ? ?Diabetes mellitus without complication ([place]) ?-HbA1c 5.8 in 06/2021  ?-Continue sliding scale insulin in the setting of IV steroid use. ? ?Chronic diastolic CHF (congestive heart failure) ([place]) ?- Currently appears to be  euvolemic.   ?-Volume management with HD, renal notified.   ?-2D echo 06/2021 had shown EF 50 to 55%, G1 DD  ?-Mildly elevated troponins, 151-> 397 likely due to acute hypoxic/hypercapnic respiratory failure.   ?-Currently no chest pain or acute shortness of breath.  Will repeat 2D echo. ? ?Lactic acidosis ?-Likely due to acute respiratory acidosis and hypoxia, dehydration.  No sepsis ?-Lactic acid improved to 1.2 ? ?History of anemia due to chronic kidney disease ?-Baseline hemoglobin 9-11 ? -H&H currently stable ? ?Hyperlipidemia ?-Continue statin ?  ?  ?  ? ?Generalized weakness ?- PT OT evaluation ? ? ?Code Status: Full CODE STATUS ?DVT Prophylaxis:  SCDs Start: 10/18/21 2209 ? ? ?Level of Care: Level of care: Progressive ?Family Communication:  ?Disposition Plan:     Remains inpatient appropriate: Needs HD today, follow-up 2D echo, now fully alert and oriented.  If remains stable, hopefully DC home in a.m. ? ?Procedures:  ? ? ?Consultants:   ?Nephrology ? ?Antimicrobials:  ? ?Zithromax 10/18/2021-->  ? ?Medications ? ? benzonatate  200 mg Oral TID  ? clopidogrel  75 mg Oral Daily  ? ferric citrate  210 mg Oral TID WC  ?  insulin aspart  0-6 Units Subcutaneous TID WC  ? ipratropium-albuterol  3 mL Nebulization Q6H  ? methylPREDNISolone (SOLU-MEDROL) injection  80 mg Intravenous Q12H  ? pantoprazole (PROTONIX) IV  40 mg Intravenous Q24H  ? simvastatin  20 mg Oral QHS  ? ? ? ?Subjective:  ?Stacey Dorsey was seen and examined today.  Having thick phlegm with mild traces of blood.  Coughing.  Alert and oriented, no chest pain or acute shortness of breath.  No fevers.  No acute events overnight.   ? ?Objective:  ? ?Vitals:  ? 10/19/21 0207 10/19/21 0400 10/19/21 0739 10/19/21 0750  ?BP:  (!) 173/67 (!) 174/73   ?Pulse:  82 92   ?Resp:  15 18   ?Temp: 97.6 ?F (36.4 ?C) 98 ?F (36.7 ?C) 97.9 ?F (36.6 ?C)   ?TempSrc: Oral Oral Oral   ?SpO2:  100% 98% 98%  ?Weight:      ?Height:      ? ? ?Intake/Output Summary (Last 24  hours) at 10/19/2021 1047 ?Last data filed at 10/19/2021 0500 ?Gross per 24 hour  ?Intake 300 ml  ?Output --  ?Net 300 ml  ? ?Filed Weights  ? 10/19/21 0200  ?Weight: 61 kg  ? ? ? ?Exam ?General: Alert and oriented x 3, NAD ?Cardiovascular: S1 S2 auscultated, RRR ?Respiratory: Diminished breath sounds at the bases ?Gastrointestinal: Soft, nontender, nondistended, + bowel sounds ?Ext: no pedal edema bilaterally ?Neuro: no new deficits ?Psych: Normal affect and demeanor, alert and oriented x3  ? ? ?Data Reviewed:  I have personally reviewed following labs  ? ? ?CBC ?Lab Results  ?Component Value Date  ? WBC 9.9 10/19/2021  ? RBC 3.09 (L) 10/19/2021  ? HGB 11.2 (L) 10/19/2021  ? HCT 33.0 (L) 10/19/2021  ? MCV 95.5 10/19/2021  ? MCH 29.1 10/19/2021  ? PLT 187 10/19/2021  ? MCHC 30.5 10/19/2021  ? RDW 15.9 (H) 10/19/2021  ? LYMPHSABS 0.7 10/19/2021  ? MONOABS 0.3 10/19/2021  ? EOSABS 0.0 10/19/2021  ? BASOSABS 0.0 10/19/2021  ? ? ? ?Last metabolic panel ?Lab Results  ?Component Value Date  ? NA 141 10/19/2021  ? K 4.6 10/19/2021  ? CL 102 10/19/2021  ? CO2 29 10/19/2021  ? BUN 48 (H) 10/19/2021  ? CREATININE 6.10 (H) 10/19/2021  ? GLUCOSE 154 (H) 10/19/2021  ? GFRNONAA 6 (L) 10/19/2021  ? CALCIUM 8.9 10/19/2021  ? PHOS 3.6 10/19/2021  ? PROT 6.7 10/19/2021  ? ALBUMIN 2.8 (L) 10/19/2021  ? BILITOT 0.6 10/19/2021  ? ALKPHOS 96 10/19/2021  ? AST 23 10/19/2021  ? ALT 16 10/19/2021  ? ANIONGAP 11 10/19/2021  ? ? ?CBG (last 3)  ?Recent Labs  ?  10/18/21 ?2325 10/19/21 ?0745  ?GLUCAP 181* 156*  ?  ? ? ?Coagulation Profile: ?Recent Labs  ?Lab 10/19/21 ?0341  ?INR 1.0  ? ? ? ?Radiology Studies: I have personally reviewed the imaging studies  ?DG Chest Portable 1 View ? ?Result Date: 10/18/2021 ?CLINICAL DATA:  Respiratory distress EXAM: PORTABLE CHEST 1 VIEW COMPARISON:  09/16/2021 FINDINGS: Cardiac shadow is enlarged but stable. Aortic calcifications are again seen. Lungs are well aerated bilaterally. Elevation of the right  hemidiaphragm is again noted with mild right basilar atelectasis. No focal confluent infiltrate is seen. No acute bony abnormality is noted. Old rib fractures are noted on the right. IMPRESSION: Mild right basilar atelectasis. Electronically Signed   By: Inez Catalina M.D.   On: 10/18/2021 20:07   ? ? ? ? ?  Estill Cotta M.D. ?[facility] ?10/19/2021, 10:47 AM ? ?Available via Epic secure chat 7am-7pm ?After 7 pm, please refer to night coverage provider listed on amion. ? ?  ?

## 2021-10-19 NOTE — Progress Notes (Signed)
PT Cancellation Note ? ?Patient Details ?Name: Stacey Dorsey ?MRN: [id number] ?DOB: [birth year] ? ? ?Cancelled Treatment:    Reason Eval/Treat Not Completed: Patient at procedure or test/unavailable Patient off unit at HD. PT will re-attempt at later date.  ? ?Korbin Mapps A. Gilford Rile, PT, DPT ?Acute Rehabilitation Services ?Pager [phone] ?Office [phone] ? ? ? ?Holley Kocurek A Letzy Gullickson ?10/19/2021, 2:43 PM ? ? ?

## 2021-10-19 NOTE — Assessment & Plan Note (Addendum)
-   PT OT evaluation pending ?

## 2021-10-19 NOTE — Procedures (Signed)
? ?  I was present at this dialysis session, have reviewed the session itself and made  appropriate changes ?Kelly Splinter MD ?[employer] ?pager [phone]   ?10/19/2021, 3:42 PM ? ? ?

## 2021-10-19 NOTE — Progress Notes (Signed)
?  Transition of Care (TOC) Screening Note ? ? ?Patient Details  ?Name: Stacey Dorsey ?Date of Birth: 04-29-1933 ? ? ?Transition of Care (TOC) CM/SW Contact:    ?Benard Halsted, LCSW ?Phone Number: ?10/19/2021, 8:25 AM ? ? ? ?Transition of Care Department [facility]) has reviewed patient and no TOC needs have been identified at this time. We will continue to monitor patient advancement through interdisciplinary progression rounds. If new patient transition needs arise, please place a TOC consult. ? ? ?

## 2021-10-19 NOTE — Progress Notes (Signed)
Pt has PRN bipap orders, no distress noted at this time. 

## 2021-10-19 NOTE — Assessment & Plan Note (Addendum)
-   Known history of COPD, initial O2 sats in 70s on room air, was placed on 10 L NRB.  ABG showed acute respiratory acidosis with pH of 7.1, PCO2 87.3 ?-Patient was placed on BiPAP, significant ABGs improved, pH 7.4, PCO2 46, alert and oriented on exam   ? -Continue scheduled DuoNebs, antitussives, IV Zithromax.   ?-Taper IV Solu-Medrol to 40 mg every 12 hours, transition to p.o. in a.m. ?-O2 sats improved, 96% on room air today ?

## 2021-10-19 NOTE — Assessment & Plan Note (Addendum)
-   Likely due to hypercapnic respiratory failure, patient was placed on BiPAP ?-Resolved ? ?

## 2021-10-19 NOTE — Assessment & Plan Note (Addendum)
-  Baseline hemoglobin 9-11 ? -H&H currently stable ?

## 2021-10-19 NOTE — Assessment & Plan Note (Addendum)
Continue statin. 

## 2021-10-19 NOTE — Assessment & Plan Note (Addendum)
-  HbA1c 5.8 in 06/2021  ?-Continue sliding scale insulin, tapering IV steroids, hopefully CBGs will improve ?

## 2021-10-19 NOTE — Consult Note (Signed)
Renal Service ?Consult Note ?[facility] ? ?Stacey Dorsey ?10/19/2021 ?[name], MD ?Requesting Physician: Dr Tana Coast ? ?Reason for Consult: ESRD pt w/ SOB ?HPI: The patient is a 88 y.o. year-old w/ hx of ESRD on HD, DM2, HTN, HL and COPD who presented to ED last night 3/24 w/ SOB from home. Tried home nebs w/o relief. CPAP placed w/o improvement, BVM got 89% sat. TTS HD patient. Pt was being bagged on arrival to ED, transitioned to NRB as she improved. She rec'd solumedrol, atrovent and IV Mg. MS improved and resp status improved. CO2 80 on VBG. Bipap was initiated. CXR w/o any acute findings. WBC 14k, BNP > 4500. Hb 10. Creat 5.8. Repeat blood gas w/ CO2 down to 50. Pt being admitted. Asked to see for HD.   ? ?Pt seen in room. No c/o at this time. Doesn't really remember coming here this am. No prod cough or fevers. No recent HD issues.  ? ?ROS - denies CP, no joint pain, no HA, no blurry vision, no rash, no diarrhea, no nausea/ vomiting, no dysuria, no difficulty voiding ? ? ?Past Medical History  ?Past Medical History:  ?Diagnosis Date  ? Chronic kidney disease   ? COPD (chronic obstructive pulmonary disease) ([place])   ? Diabetes mellitus without complication ([place])   ? Hyperlipidemia   ? Hypertension   ? ?Past Surgical History  ?Past Surgical History:  ?Procedure Laterality Date  ? ABDOMINAL HYSTERECTOMY    ? AV FISTULA PLACEMENT    ? IR REMOVAL TUN CV CATH W/O FL  09/07/2020  ? ?Family History History reviewed. No pertinent family history. ?Social History  reports that she has never smoked. She has never used smokeless tobacco. She reports that she does not currently use alcohol. She reports that she does not use drugs. ?Allergies  ?Allergies  ?Allergen Reactions  ? Penicillins Rash  ? Prednisone Other (See Comments)  ?  insomnia ? ?  ? Scallops [Shellfish Allergy]   ?  Threw up blood ?Scallops only - can eat other shellfish  ? Tramadol Anxiety  ? ?Home medications ?Prior to Admission medications    ?Medication Sig Start Date End Date Taking? Authorizing Provider  ?acetaminophen (TYLENOL) 500 MG tablet Take 2 tablets (1,000 mg total) by mouth 3 (three) times daily. For 3 to 5 days and then can consider changing it to as needed based on improvement of her musculoskeletal chest pain related to rib fractures. ?Patient taking differently: Take 1,000 mg by mouth every 8 (eight) hours as needed for mild pain. 07/16/21   Modena Jansky, MD  ?albuterol (PROVENTIL) (2.5 MG/3ML) 0.083% nebulizer solution Inhale 2.5 mg into the lungs every 4 (four) hours as needed for wheezing or shortness of breath. 02/16/20   [provider]  ?albuterol (VENTOLIN HFA) 108 (90 Base) MCG/ACT inhaler Inhale 2 puffs into the lungs every 4 (four) hours as needed for shortness of breath. 04/02/20   [provider]  ?carvedilol (COREG) 25 MG tablet Take 1 tablet (25 mg total) by mouth See admin instructions. Take 25 mg twice daily on nondailysis days (Sun, Mon, Wed, and Fri) ?Patient taking differently: Take 25 mg by mouth in the morning and at bedtime. 07/26/21   Setzer, Edman Circle, PA-C  ?cinacalcet (SENSIPAR) 30 MG tablet Take 1 tablet (30 mg total) by mouth Every Tuesday,Thursday,and Saturday with dialysis. 07/27/21   Setzer, Edman Circle, PA-C  ?cloNIDine (CATAPRES) 0.1 MG tablet Take 1 tablet (0.1 mg total) by mouth  2 (two) times daily. 07/26/21   Setzer, Edman Circle, PA-C  ?clopidogrel (PLAVIX) 75 MG tablet Take 1 tablet (75 mg total) by mouth daily. 07/26/21   Setzer, Edman Circle, PA-C  ?Darbepoetin Alfa (ARANESP) 60 MCG/0.3ML SOSY injection Inject 0.3 mLs (60 mcg total) into the skin every Thursday at 6pm. 08/01/21   Setzer, Edman Circle, PA-C  ?docusate sodium (COLACE) 100 MG capsule Take 1 capsule (100 mg total) by mouth 2 (two) times daily. 07/16/21   Modena Jansky, MD  ?doxercalciferol (HECTOROL) 4 MCG/2ML injection Inject 2.5 mLs (5 mcg total) into the vein Every Tuesday,Thursday,and Saturday with dialysis. 07/27/21    Setzer, Edman Circle, PA-C  ?ferric citrate (AURYXIA) 1 GM 210 MG(Fe) tablet Take 1 tablet (210 mg total) by mouth 3 (three) times daily with meals. 07/26/21   Setzer, Edman Circle, PA-C  ?fluticasone-salmeterol (ADVAIR HFA) 115-21 MCG/ACT inhaler Inhale 1 puff into the lungs 2 (two) times daily. 07/26/21   Setzer, Edman Circle, PA-C  ?lidocaine (LIDODERM) 5 % Place 2 patches onto the skin daily. Remove & Discard patch within 12 hours or as directed by MD 07/26/21   Vaughan Basta, Edman Circle, PA-C  ?lidocaine-prilocaine (EMLA) cream Apply 1 application topically 3 (three) times a week. 04/05/21   [provider]  ?Menthol-Methyl Salicylate (MUSCLE RUB) 10-15 % CREA Apply 1 application topically as needed for muscle pain.    [provider]  ?multivitamin (RENA-VIT) TABS tablet Take 1 tablet by mouth at bedtime. 07/26/21   Setzer, Edman Circle, PA-C  ?NIFEdipine (PROCARDIA-XL/NIFEDICAL-XL) 30 MG 24 hr tablet Take 2 tablets (60 mg total) by mouth at bedtime. ?Patient taking differently: Take 30 mg by mouth in the morning and at bedtime. 07/26/21   Setzer, Edman Circle, PA-C  ?omeprazole (PRILOSEC) 20 MG capsule Take 2 capsules (40 mg total) by mouth daily. 07/26/21   Setzer, Edman Circle, PA-C  ?Polyethyl Glycol-Propyl Glycol (SYSTANE OP) Place 1 drop into both eyes daily as needed (dry eyes).    [provider]  ?polyethylene glycol (MIRALAX / GLYCOLAX) 17 g packet Take 17 g by mouth daily. ?Patient taking differently: Take 17 g by mouth daily as needed for mild constipation. 07/26/21   Setzer, Edman Circle, PA-C  ?rOPINIRole (REQUIP) 0.5 MG tablet Take 0.5 mg by mouth at bedtime. 05/03/21   [provider]  ?sertraline (ZOLOFT) 25 MG tablet Take 25 mg by mouth daily. 09/04/21   [provider]  ?simvastatin (ZOCOR) 20 MG tablet Take 20 mg by mouth at bedtime. 05/03/21   [provider]  ?traZODone (DESYREL) 50 MG tablet TAKE 1 TABLET(50 MG) BY MOUTH AT BEDTIME AS NEEDED FOR SLEEP ?Patient taking  differently: Take 50 mg by mouth at bedtime as needed for sleep. 09/02/21   Bayard Hugger, NP  ? ? ? ?Vitals:  ? 10/19/21 0207 10/19/21 0400 10/19/21 0739 10/19/21 0750  ?BP:  (!) 173/67 (!) 174/73   ?Pulse:  82 92   ?Resp:  15 18   ?Temp: 97.6 ?F (36.4 ?C) 98 ?F (36.7 ?C) 97.9 ?F (36.6 ?C)   ?TempSrc: Oral Oral Oral   ?SpO2:  100% 98% 98%  ?Weight:      ?Height:      ? ?Exam ?Gen alert, no distress, [place] O2, pleasant ?No rash, cyanosis or gangrene ?Sclera anicteric, throat clear  ?No jvd or bruits ?Chest clear bilat to bases, no rales/ wheezing ?RRR no RG ?Abd soft ntnd no mass or ascites +bs ?GU normal ?MS no joint effusions  or deformity ?Ext no LE or UE edema, no wounds or ulcers ?Neuro is alert, Ox 3 , nf ?   LUA AVF+bruit ? ? ? ? OP HD: AF TTS ?  3.5h  400/500  56.5kg  2/2 bath P2 LUA AVG Heparin none ?  - 3/25 > hep B Ag neg, hep B Ab's+/ protective ?  - leaving 59kg last few ? - mircera 150 q2, last 3/18, due 4/1 ? - sensipar 30 tiw ? - hectorol 5 ug tiw ? ?  CXR 3/25 - Cardiac shadow is enlarged but stable. Aortic calcifications are again seen. Lungs are well aerated bilaterally. Elevation of the right hemidiaphragm is again noted with mild right basilar atelectasis. No focal confluent infiltrate is seen  ?  Na 142 K 4.6 CO2 29 BUN 48 Cr 6.10 ? ?Assessment/ Plan: ?Acute hypercapneic resp failure - sp CPAP / BVM, then Bipap in ED. Now on [place] O2. Responded to solumedrol, Mg, atrovent. Per pmd.  ?AMS - likely due to CO2 retention, improved, Ox 3 ?ESRD - on HD TTS. No missed HD, plan HD today.  ?BP/ volume - no edema per CXR, but up 5 kg by wts. UF goal 3 L as tolerated w/ HD today.  ?Anemia esrd - Hb 9 here. Next esa due 4/1.  ?MBD ckd - CCa and phos in range, cont auyxia as binder and cont vdra IV and sensipar po w/ each HD.  ?  ? ? ? ?Kelly Splinter  MD ?10/19/2021, 10:44 AM ?Recent Labs  ?Lab 10/18/21 ?1938 10/18/21 ?2059 10/19/21 ?0302 10/19/21 ?0306  ?HGB  --    < > 9.0* 11.2*  ?ALBUMIN 3.0*  --  2.8*  --    ?CALCIUM 8.6*  --  8.9  --   ?PHOS  --   --  3.6  --   ?CREATININE 5.87*  --  6.10*  --   ?K 3.8   < > 4.6 4.6  ? < > = values in this interval not displayed.  ? ? ?

## 2021-10-19 NOTE — Progress Notes (Signed)
?  Echocardiogram ?2D Echocardiogram has been performed. ? ?Stacey Dorsey ?10/19/2021, 12:17 PM ?

## 2021-10-20 DIAGNOSIS — J441 Chronic obstructive pulmonary disease with (acute) exacerbation: Secondary | ICD-10-CM

## 2021-10-20 LAB — GLUCOSE, CAPILLARY
Glucose-Capillary: 240 mg/dL — ABNORMAL HIGH (ref 70–99)
Glucose-Capillary: 208 mg/dL — ABNORMAL HIGH (ref 70–99)
Glucose-Capillary: 327 mg/dL — ABNORMAL HIGH (ref 70–99)
Glucose-Capillary: 268 mg/dL — ABNORMAL HIGH (ref 70–99)

## 2021-10-20 MED ORDER — PANTOPRAZOLE SODIUM 40 MG PO TBEC
40.0000 mg | DELAYED_RELEASE_TABLET | Freq: Every day | ORAL | Status: DC
  Administered 2021-10-20 – 2021-10-21 (×2): 40 mg via ORAL
  Filled 2021-10-20 (×2): qty 1

## 2021-10-20 MED ORDER — INSULIN ASPART 100 UNIT/ML IJ SOLN
0.0000 [IU] | Freq: Three times a day (TID) | INTRAMUSCULAR | Status: DC
  Administered 2021-10-21 (×2): 5 [IU] via SUBCUTANEOUS

## 2021-10-20 MED ORDER — INSULIN ASPART 100 UNIT/ML IJ SOLN
0.0000 [IU] | Freq: Every day | INTRAMUSCULAR | Status: DC
  Administered 2021-10-20: 3 [IU] via SUBCUTANEOUS

## 2021-10-20 MED ORDER — METHYLPREDNISOLONE SODIUM SUCC 40 MG IJ SOLR
40.0000 mg | Freq: Two times a day (BID) | INTRAMUSCULAR | Status: DC
  Administered 2021-10-20: 40 mg via INTRAVENOUS
  Filled 2021-10-20: qty 1

## 2021-10-20 NOTE — Evaluation (Signed)
Physical Therapy Evaluation ?Patient Details ?Name: Stacey Dorsey ?MRN: [id number] ?DOB: 02/05/1933 ?Today's Date: 10/20/2021 ? ?History of Present Illness ? Patient is a 88 year old female  presented with acute hypoxic and hypercapnic respiratory failure.  EMS was called for altered mental status.  Per family, patient had 1 to 2 days of progressive altered mental status.,  Increased somnolence.  Patient has been experiencing new onset rhinitis, rhinorrhea and productive cough over the last 2 days.  She also reported generalized weakness. with ESRD on HD TTS, COPD, diabetes mellitus, hypertension, hyperlipidemia, chronic diastolic CHF  ?Clinical Impression ?  ?Pt admitted with above diagnosis. Lives at home with daughter, in a multi-level home (pt can stay on main level) with 2 steps to enter; Prior to admission, pt was able to walk independently, and manage basic ADLs; tells me her daughter drives her to and from HD, and that she typically needs a nap post HD; Presents to PT with generalized weakness and decr activity tolerance; Min/minguard assist with all mobility;  Pt currently with functional limitations due to the deficits listed below (see PT Problem List). Pt will benefit from skilled PT to increase their independence and safety with mobility to allow discharge to the venue listed below; anticipate she will be able to dc home without difficulty. ?   ? ?Recommendations for follow up therapy are one component of a multi-disciplinary discharge planning process, led by the attending physician.  Recommendations may be updated based on patient status, additional functional criteria and insurance authorization. ? ?Follow Up Recommendations No PT follow up ? ?  ?Assistance Recommended at Discharge Set up Supervision/Assistance  ?Patient can return home with the following ? A little help with walking and/or transfers;Help with stairs or ramp for entrance ? ?  ?Equipment Recommendations None recommended by PT   ?Recommendations for Other Services ?    ?  ?Functional Status Assessment Patient has had a recent decline in their functional status and demonstrates the ability to make significant improvements in function in a reasonable and predictable amount of time.  ? ?  ?Precautions / Restrictions Precautions ?Precautions: Fall ?Precaution Comments: Fall risk present, but minimal, especially with use of RW  ? ?  ? ?Mobility ? Bed Mobility ?Overal bed mobility: Modified Independent ?  ?  ?  ?  ?  ?  ?  ?  ? ?Transfers ?Overall transfer level: Needs assistance ?Equipment used: Rolling walker (2 wheels) ?Transfers: Sit to/from Stand ?Sit to Stand: Supervision ?  ?  ?  ?  ?  ?General transfer comment: good rise ?  ? ?Ambulation/Gait ?Ambulation/Gait assistance: Min assist (with and without phsyical contact) ?Gait Distance (Feet): 300 Feet ?Assistive device: Rolling walker (2 wheels) ?Gait Pattern/deviations: Step-through pattern ?Gait velocity: approaching WNL ?  ?  ?General Gait Details: Good use of RW for steadiness ? ?Stairs ?  ?  ?  ?  ?  ? ?Wheelchair Mobility ?  ? ?Modified Rankin (Stroke Patients Only) ?  ? ?  ? ?Balance Overall balance assessment: Needs assistance ?  ?Sitting balance-Leahy Scale: Normal ?  ?  ?  ?Standing balance-Leahy Scale: Fair ?  ?  ?  ?  ?  ?  ?  ?  ?  ?  ?  ?  ?   ? ? ? ?Pertinent Vitals/Pain Pain Assessment ?Pain Assessment: No/denies pain  ? ? ?Home Living Family/patient expects to be discharged to:: Private residence ?Living Arrangements: Children ?Available Help at Discharge: Family ?Type of Home: House ?Home Access:  Stairs to enter ?Entrance Stairs-Rails: None ?Entrance Stairs-Number of Steps: 2 ?  ?Home Layout: Two level;Able to live on main level with bedroom/bathroom ?Home Equipment: Conservation officer, nature (2 wheels);Cane - single point;Hand held shower head;Grab bars - tub/shower;Shower seat ?   ?  ?Prior Function Prior Level of Function : Needs assist ?  ?  ?  ?  ?  ?  ?Mobility Comments:  patient states she walks in/out of the home without an AD; has a RW that she can use when needed; tired after HD, and naps; daughter drives her to/from HD ?ADLs Comments: REports modified independent ?  ? ? ?Hand Dominance  ? Dominant Hand: Right ? ?  ?Extremity/Trunk Assessment  ? Upper Extremity Assessment ?Upper Extremity Assessment: Generalized weakness ?  ? ?Lower Extremity Assessment ?Lower Extremity Assessment: Generalized weakness ?  ? ?   ?Communication  ? Communication: No difficulties  ?Cognition Arousal/Alertness: Awake/alert ?Behavior During Therapy: [facility] for tasks assessed/performed ?Overall Cognitive Status: No family/caregiver present to determine baseline cognitive functioning ?  ?  ?  ?  ?  ?  ?  ?  ?  ?  ?  ?  ?  ?  ?  ?  ?  ?  ?  ? ?  ?General Comments General comments (skin integrity, edema, etc.): walked on Room Air and o2 sats remained greater than or equal to 90% ? ?  ?Exercises    ? ?Assessment/Plan  ?  ?PT Assessment Patient needs continued PT services  ?PT Problem List Decreased strength;Decreased activity tolerance;Decreased balance;Decreased mobility;Decreased knowledge of use of DME;Decreased safety awareness ? ?   ?  ?PT Treatment Interventions DME instruction;Gait training;Stair training;Functional mobility training;Therapeutic activities;Therapeutic exercise;Balance training;Cognitive remediation;Patient/family education   ? ?PT Goals (Current goals can be found in the Care Plan section)  ?Acute Rehab PT Goals ?Patient Stated Goal: Wants a nap after walking; hopes to go home tomorrow ?PT Goal Formulation: With patient ?Time For Goal Achievement: 11/03/21 ?Potential to Achieve Goals: Good ? ?  ?Frequency Min 3X/week ?  ? ? ?Co-evaluation   ?  ?  ?  ?  ? ? ?  ?AM-PAC PT "6 Clicks" Mobility  ?Outcome Measure Help needed turning from your back to your side while in a flat bed without using bedrails?: None ?Help needed moving from lying on your back to sitting on the side of a flat bed  without using bedrails?: None ?Help needed moving to and from a bed to a chair (including a wheelchair)?: None ?Help needed standing up from a chair using your arms (e.g., wheelchair or bedside chair)?: A Little ?Help needed to walk in hospital room?: A Little ?Help needed climbing 3-5 steps with a railing? : A Little ?6 Click Score: 21 ? ?  ?End of Session   ?Activity Tolerance: Patient tolerated treatment well ?Patient left: in bed;with call bell/phone within reach ?Nurse Communication: Mobility status ?PT Visit Diagnosis: Unsteadiness on feet (R26.81) ?  ? ?Time: 8315-1761 ?PT Time Calculation (min) (ACUTE ONLY): 16 min ? ? ?Charges:   PT Evaluation ?$PT Eval Low Complexity: 1 Low ?  ?  ?   ? ? ?Roney Marion, PT  ?Acute Rehabilitation Services ?Pager [phone] ?Office [phone] ? ? ?[place] ?10/20/2021, 5:39 PM ? ?

## 2021-10-20 NOTE — Progress Notes (Signed)
? ? ? [facility] ?                                                                            ? ? 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 ?LOS - 2  days ? ? ? ?Brief summary  ? ?Patient is a 88 year old female with ESRD on HD TTS, COPD, diabetes mellitus, hypertension, hyperlipidemia, chronic diastolic CHF presented with acute hypoxic and hypercapnic respiratory failure.  EMS was called for altered mental status.  Per family, patient had 1 to 2 days of progressive altered mental status.,  Increased somnolence.  Patient has been experiencing new onset rhinitis, rhinorrhea and productive cough over the last 2 days.  She also reported generalized weakness. ? ?Patient was found to have acute respiratory acidosis with hypercapnic/hypoxic respiratory failure.  Initially placed on O2 10 L NRB by EMS, subsequently transitioned to BiPAP in ED. ? ? ? ? ?Assessment & Plan  ? ? ?Assessment and Plan: ?* Acute respiratory failure with hypoxia and hypercapnia (HCC) ?- Known history of COPD, initial O2 sats in 70s on room air, was placed on 10 L NRB.  ABG showed acute respiratory acidosis with pH of 7.1, PCO2 87.3 ?-Patient was placed on BiPAP, significant ABGs improved, pH 7.4, PCO2 46, alert and oriented on exam   ? -Continue scheduled DuoNebs, antitussives, IV Zithromax.   ?-Taper IV Solu-Medrol to 40 mg every 12 hours, transition to p.o. prednisone in a.m. with taper  ?-O2 sats improved, 96% on room air today ? ?Acute encephalopathy ?- Likely due to hypercapnic respiratory failure, patient was placed on BiPAP ?-Resolved ? ? ?ESRD (end stage renal disease) ([place]) ?-On HD, TTS schedule.   ?-Underwent hemodialysis on 3/25, next HD on Tuesday ? ?Diabetes mellitus without complication ([place]) ?-HbA1c 5.8 in 06/2021  ?-Continue sliding scale insulin, tapering IV steroids, hopefully CBGs will  improve ? ?Chronic diastolic CHF (congestive heart failure) ([place]) ?- Currently appears to be euvolemic.   ?-Mildly elevated troponins, 151-> 397 likely due to acute hypoxic/hypercapnic respiratory failure.   ?-2D echo showed EF of 55 to 60%, G1 DD, right ventricular overload mild MR ?-Volume management with hemodialysis, underwent HD on 3/25 ? ?Lactic acidosis ?-Likely due to acute respiratory acidosis and hypoxia, dehydration.  No sepsis ?-Lactic acid improved to 1.2 ? ?History of anemia due to chronic kidney disease ?-Baseline hemoglobin 9-11 ? -H&H currently stable ? ?Hyperlipidemia ?-Continue statin ?  ?  ?  ? ?Generalized weakness ?- PT OT evaluation pending ? ?Code Status: Full code ?DVT Prophylaxis:  SCDs Start: 10/18/21 2209 ? ? ?Level of Care: Level of care: Progressive--> changed to med telemetry ?Family Communication: called patient's daughter, Ms Michaelle Copas and updated in detail. ?Disposition Plan:     Remains inpatient appropriate: Plan for DC home in a.m., will transition to oral prednisone with taper ? ?Procedures:  ?Hemodialysis ? ?Consultants:   ?Nephrology ? ?Antimicrobials:  ? ?Anti-infectives (From admission, onward)  ? ? Start     Dose/Rate Route Frequency Ordered Stop  ? 10/18/21 2245  azithromycin (ZITHROMAX) 500  mg in sodium chloride 0.9 % 250 mL IVPB       ? 500 mg ?250 mL/hr over 60 Minutes Intravenous Every 24 hours 10/18/21 2233    ? ?  ? ? ? ?Medications ? ? benzonatate  200 mg Oral TID  ? Chlorhexidine Gluconate Cloth  6 each Topical Q0600  ? [START ON 10/22/2021] cinacalcet  30 mg Oral Q T,Th,Sa-HD  ? cloNIDine  0.1 mg Oral BID  ? clopidogrel  75 mg Oral Daily  ? [START ON 10/22/2021] doxercalciferol  5 mcg Intravenous Q T,Th,Sa-HD  ? ferric citrate  210 mg Oral TID WC  ? insulin aspart  0-6 Units Subcutaneous TID WC  ? ipratropium-albuterol  3 mL Nebulization TID  ? methylPREDNISolone (SOLU-MEDROL) injection  40 mg Intravenous Q12H  ? NIFEdipine  60 mg Oral QHS  ? pantoprazole  40  mg Oral Daily  ? simvastatin  20 mg Oral QHS  ? traZODone  50 mg Oral QHS  ? ? ? ?Subjective:  ? ?Stacey Dorsey was seen and examined today.  Feeling a lot better today, patient denies dizziness, chest pain, shortness of breath, abdominal pain, N/V/. No acute events overnight.  Sats improving, 96% on room air ? ?Objective:  ? ?Vitals:  ? 10/20/21 0800 10/20/21 0850 10/20/21 1130 10/20/21 1450  ?BP: (!) 116/50  (!) 121/54   ?Pulse: 74  74   ?Resp: 20  17   ?Temp: 98 ?F (36.7 ?C)  98.2 ?F (36.8 ?C)   ?TempSrc: Oral  Oral   ?SpO2: 94% 97% 96% 96%  ?Weight:      ?Height:      ? ? ?Intake/Output Summary (Last 24 hours) at 10/20/2021 1528 ?Last data filed at 10/20/2021 0900 ?Gross per 24 hour  ?Intake 310 ml  ?Output -3000 ml  ?Net 3310 ml  ? ?Filed Weights  ? 10/19/21 0200 10/19/21 1438 10/19/21 1830  ?Weight: 61 kg 61.3 kg 58.3 kg  ? ? ? ?Exam ?General: Alert and oriented x 3, NAD, pleasant ?Cardiovascular: S1 S2 auscultated, no murmurs, RRR ?Respiratory: Mild scattered wheezing ?Gastrointestinal: Soft, nontender, nondistended, + bowel sounds ?Ext: no pedal edema bilaterally ?Neuro: no new deficits ?Psych: Normal affect and demeanor, alert and oriented x3  ? ? ?Data Reviewed:  I have personally reviewed following labs  ? ? ?CBC ?Lab Results  ?Component Value Date  ? WBC 9.9 10/19/2021  ? RBC 3.09 (L) 10/19/2021  ? HGB 11.2 (L) 10/19/2021  ? HCT 33.0 (L) 10/19/2021  ? MCV 95.5 10/19/2021  ? MCH 29.1 10/19/2021  ? PLT 187 10/19/2021  ? MCHC 30.5 10/19/2021  ? RDW 15.9 (H) 10/19/2021  ? LYMPHSABS 0.7 10/19/2021  ? MONOABS 0.3 10/19/2021  ? EOSABS 0.0 10/19/2021  ? BASOSABS 0.0 10/19/2021  ? ? ? ?Last metabolic panel ?Lab Results  ?Component Value Date  ? NA 141 10/19/2021  ? K 4.6 10/19/2021  ? CL 102 10/19/2021  ? CO2 29 10/19/2021  ? BUN 48 (H) 10/19/2021  ? CREATININE 6.10 (H) 10/19/2021  ? GLUCOSE 154 (H) 10/19/2021  ? GFRNONAA 6 (L) 10/19/2021  ? CALCIUM 8.9 10/19/2021  ? PHOS 3.6 10/19/2021  ? PROT 6.7 10/19/2021  ?  ALBUMIN 2.8 (L) 10/19/2021  ? BILITOT 0.6 10/19/2021  ? ALKPHOS 96 10/19/2021  ? AST 23 10/19/2021  ? ALT 16 10/19/2021  ? ANIONGAP 11 10/19/2021  ? ? ?CBG (last 3)  ?Recent Labs  ?  10/19/21 ?2130 10/20/21 ?2505 10/20/21 ?1139  ?GLUCAP 232* 240*  208*  ?  ? ? ?Coagulation Profile: ?Recent Labs  ?Lab 10/19/21 ?0341  ?INR 1.0  ? ? ? ?Radiology Studies: I have personally reviewed the imaging studies  ?DG Chest Portable 1 View ? ?Result Date: 10/18/2021 ? IMPRESSION: Mild right basilar atelectasis. Electronically Signed   By: Inez Catalina M.D.   On: 10/18/2021 20:07  ? ?ECHOCARDIOGRAM COMPLETE ? ?Result Date: 10/19/2021 ?IMPRESSIONS  1. Left ventricular ejection fraction, by estimation, is 55 to 60%. The left ventricle has normal function. The left ventricle has no regional wall motion abnormalities. Left ventricular diastolic parameters are consistent with Grade I diastolic dysfunction (impaired relaxation). Elevated left ventricular end-diastolic pressure. The E/e' is 25. There is abnormal (paradoxical) septal motion, consistent with right ventricular volume overload.  2. Right ventricular systolic function is normal. The right ventricular size is normal. There is severely elevated pulmonary artery systolic pressure. The estimated right ventricular systolic pressure is 43.7 mmHg.  3. Left atrial size was moderately dilated.  4. The mitral valve is abnormal. Mild mitral valve regurgitation. Mild mitral stenosis. The mean mitral valve gradient is 6.0 mmHg with average heart rate of 90 bpm. Moderate mitral annular calcification.  5. The aortic valve is tricuspid. Aortic valve regurgitation is not visualized. Mild to moderate aortic valve stenosis. Aortic valve area, by VTI measures 1.38 cm?Marland Kitchen Aortic valve mean gradient measures 15.0 mmHg. Aortic valve Vmax measures 2.51 m/s. DI is 0.49.  6. The inferior vena cava is dilated in size with <50% respiratory variability, suggesting right atrial pressure of 15 mmHg.  Comparison(s): Changes from prior study are noted. 07/04/2021: LVEF 50-55%, moderaet LVH, grade 1 DD, RVSP 61.5 mmHg, mild LAE, mild AS - mean gradient 9 mmHg.  ? ? ? ?Estill Cotta M.D. ?[facility] ?10/20/2021, 3:28 PM ?

## 2021-10-20 NOTE — Progress Notes (Signed)
[facility] ?Progress Note ? ?Subjective: HD last night w/ 3 L removed, up 1-2kg post HD. Feeling better, no SOB or cough or CP today.  ? ?Vitals:  ? 10/19/21 1830 10/19/21 2024 10/19/21 2040 10/20/21 0000  ?BP: (!) 179/88 (!) 178/90  (!) 136/53  ?Pulse: 82 100  (!) 105  ?Resp:  (!) 22  20  ?Temp: 98.2 ?F (36.8 ?C) 98.2 ?F (36.8 ?C)  98 ?F (36.7 ?C)  ?TempSrc: Oral Oral  Oral  ?SpO2:  97% 96%   ?Weight: 58.3 kg     ?Height:      ? ? ?Exam: ?Gen alert, no distress,  O2, pleasant ?No jvd or bruits ?Chest clear bilat to bases ?RRR no RG ?Abd soft ntnd no mass or ascites +bs ?Ext no LE or UE edema ?Neuro is alert, Ox 3 , nf ?   LUA AVF+bruit ?  ?  ?  ? OP HD: AF TTS ?  3.5h  400/500  56.5kg  2/2 bath P2 LUA AVG Heparin none ?  - 3/25 > hep B Ag neg, hep B Ab's+/ protective ?  - leaving 59kg last few ? - mircera 150 q2, last 3/18, due 4/1 ? - sensipar 30 tiw ? - hectorol 5 ug tiw ?  ?  CXR 3/25 - Cardiac shadow is enlarged but stable. Aortic calcifications are again seen. Lungs are well aerated bilaterally. Elevation of the right hemidiaphragm is again noted with mild right basilar atelectasis. No focal confluent infiltrate is seen  ?  Na 142 K 4.6 CO2 29 BUN 48 Cr 6.10 ?  ?Assessment/ Plan: ?Acute hypercapneic resp failure - required BVM w/ EMS then Bipap in ED. Pt responded to solumedrol, Mg, atrovent and was weaned to [facility] in ED yesterday. Likely COPD (+/- vol overload). CXR no edema. We did HD then later in the afternoon which was well tolerated, got 3 L UF. Close to dry wt today.  ?AMS - likely due to CO2 retention, improved/ resolved. Ox 3 ?ESRD - on HD TTS. Had not missed HD. Had HD here Saturday as above. Next HD 3/28.  ?BP/ volume - no edema per CXR. Wt's close to dry wt post HD yest ?Anemia esrd - Hb 9 here. Next esa due 4/1.  ?MBD ckd - CCa and phos in range, cont auyxia as binder and cont vdra IV and sensipar po w/ HD.  ?  ?  ? ? ? ?Rob Cecile Gillispie ?10/20/2021, 6:51 AM ? ? ?Recent Labs  ?Lab  10/18/21 ?1938 10/18/21 ?2059 10/19/21 ?0302 10/19/21 ?0306  ?HGB  --    < > 9.0* 11.2*  ?ALBUMIN 3.0*  --  2.8*  --   ?CALCIUM 8.6*  --  8.9  --   ?PHOS  --   --  3.6  --   ?CREATININE 5.87*  --  6.10*  --   ?K 3.8   < > 4.6 4.6  ? < > = values in this interval not displayed.  ? ?Inpatient medications: ? benzonatate  200 mg Oral TID  ? Chlorhexidine Gluconate Cloth  6 each Topical Q0600  ? [START ON 10/22/2021] cinacalcet  30 mg Oral Q T,Th,Sa-HD  ? cloNIDine  0.1 mg Oral BID  ? clopidogrel  75 mg Oral Daily  ? [START ON 10/22/2021] doxercalciferol  5 mcg Intravenous Q T,Th,Sa-HD  ? ferric citrate  210 mg Oral TID WC  ? insulin aspart  0-6 Units Subcutaneous TID WC  ? ipratropium-albuterol  3 mL Nebulization  TID  ? methylPREDNISolone (SOLU-MEDROL) injection  80 mg Intravenous Q12H  ? NIFEdipine  60 mg Oral QHS  ? pantoprazole (PROTONIX) IV  40 mg Intravenous Q24H  ? simvastatin  20 mg Oral QHS  ? traZODone  50 mg Oral QHS  ? ? azithromycin 500 mg (10/19/21 2316)  ? ?acetaminophen **OR** acetaminophen, albuterol, guaiFENesin-dextromethorphan ? ? ? ? ? ? ?

## 2021-10-21 LAB — GLUCOSE, CAPILLARY
Glucose-Capillary: 258 mg/dL — ABNORMAL HIGH (ref 70–99)
Glucose-Capillary: 261 mg/dL — ABNORMAL HIGH (ref 70–99)
Glucose-Capillary: 230 mg/dL — ABNORMAL HIGH (ref 70–99)
Glucose-Capillary: 30 mg/dL — CL (ref 70–99)
Glucose-Capillary: 186 mg/dL — ABNORMAL HIGH (ref 70–99)
Glucose-Capillary: 222 mg/dL — ABNORMAL HIGH (ref 70–99)

## 2021-10-21 MED ORDER — IPRATROPIUM-ALBUTEROL 0.5-2.5 (3) MG/3ML IN SOLN: 3.0000 mL | RESPIRATORY_TRACT | Status: DC | PRN

## 2021-10-21 MED ORDER — DEXTROSE 50 % IV SOLN
INTRAVENOUS | Status: AC
  Administered 2021-10-21: 50 mL
  Filled 2021-10-21: qty 50

## 2021-10-21 MED ORDER — METHYLPREDNISOLONE SODIUM SUCC 40 MG IJ SOLR: 40.0000 mg | Freq: Every day | INTRAMUSCULAR | Status: DC

## 2021-10-21 MED ORDER — AZITHROMYCIN 500 MG PO TABS
250.0000 mg | ORAL_TABLET | Freq: Every day | ORAL | Status: DC
  Administered 2021-10-21: 250 mg via ORAL
  Filled 2021-10-21: qty 1

## 2021-10-21 NOTE — Progress Notes (Signed)
 [facility] Progress Note   Subjective:Seen in room. No C/Os but says she has not been eating well at home   Objective Vitals:   10/21/21 0458 10/21/21 0500 10/21/21 0524 10/21/21 0758  BP: (!) 83/50  (!) 125/59 (!) 122/55  Pulse: 90  82 84  Resp: 20  (!) 21 18  Temp: 98.3 F (36.8 C)   98.1 F (36.7 C)  TempSrc: Oral   Oral  SpO2: 98%   100%  Weight:  59.6 kg    Height:       Physical Exam General: Pleasant elderly female in NAD Heart:S1,S2 2/6 systolic M. No R/G Lungs: CTAB Abdomen: soft, NABS Extremities:No LE edema Dialysis Access: L AVF + T/B   Additional Objective Labs: Basic Metabolic Panel: Recent Labs  Lab 10/18/21 1932 10/18/21 1935 10/18/21 1938 10/18/21 2059 10/19/21 0302 10/19/21 0306  NA 140   < > 138 139 142 141  K 3.8   < > 3.8 4.5 4.6 4.6  CL 104  --  101  --  102  --   CO2  --   --  26  --  29  --   GLUCOSE 274*  --  271*  --  154*  --   BUN 46*  --  44*  --  48*  --   CREATININE 5.80*  --  5.87*  --  6.10*  --   CALCIUM   --   --  8.6*  --  8.9  --   PHOS  --   --   --   --  3.6  --    < > = values in this interval not displayed.   Liver Function Tests: Recent Labs  Lab 10/18/21 1938 10/19/21 0302  AST 24 23  ALT 13 16  ALKPHOS 99 96  BILITOT 0.6 0.6  PROT 7.1 6.7  ALBUMIN  3.0* 2.8*   No results for input(s): LIPASE, AMYLASE in the last 168 hours. CBC: Recent Labs  Lab 10/18/21 1921 10/18/21 1932 10/18/21 2059 10/19/21 0302 10/19/21 0306  WBC 14.5*  --   --  9.9  --   NEUTROABS 6.6  --   --  8.9*  --   HGB 10.3*   < > 9.9* 9.0* 11.2*  HCT 34.1*   < > 29.0* 29.5* 33.0*  MCV 98.6  --   --  95.5  --   PLT 202  --   --  187  --    < > = values in this interval not displayed.   Blood Culture    Component Value Date/Time   SDES RIGHT ANTECUBITAL 07/04/2021 1123   SDES RIGHT ANTECUBITAL 07/04/2021 1123   SPECREQUEST BOTTLES DRAWN AEROBIC ONLY 07/04/2021 1123   SPECREQUEST ANAEROBIC 07/04/2021 1123   CULT   07/04/2021 1123    NO GROWTH 5 DAYS Performed at [facility], [address], [place], [place] [address]    CULT  07/04/2021 1123    NO GROWTH 5 DAYS Performed at [facility], [address], [place], [place] [address]    REPTSTATUS 07/09/2021 FINAL 07/04/2021 1123   REPTSTATUS 07/09/2021 FINAL 07/04/2021 1123    Cardiac Enzymes: No results for input(s): CKTOTAL, CKMB, CKMBINDEX, TROPONINI in the last 168 hours. CBG: Recent Labs  Lab 10/20/21 0810 10/20/21 1139 10/20/21 1721 10/20/21 2205 10/21/21 0801  GLUCAP 240* 208* 327* 268* 258*   Iron  Studies: No results for input(s): IRON , TIBC, TRANSFERRIN, FERRITIN in the last 72  hours. @lablastinr3 @ Studies/Results: ECHOCARDIOGRAM COMPLETE  Result Date: 10/19/2021    ECHOCARDIOGRAM REPORT   Patient Name:   Stacey Dorsey Date of Exam: 10/19/2021 Medical Rec #:  [id number]     Height:       66.0 in Accession #:    [id number]    Weight:       134.5 lb Date of Birth:  Dec 23, 1932     BSA:          1.689 m Patient Age:    88 years      BP:           174/73 mmHg Patient Gender: F             HR:           91 bpm. Exam Location:  Inpatient Procedure: 2D Echo Indications:    elevated troponin  History:        Patient has prior history of Echocardiogram examinations, most                 recent 07/04/2021. CHF, end stage renal disease and COPD,                 Signs/Symptoms:Altered Mental Status; Risk Factors:Diabetes,                 Hypertension and Dyslipidemia.  Sonographer:    Tinnie Barefoot RDCS Referring Phys: [id number] JUSTIN B HOWERTER IMPRESSIONS  1. Left ventricular ejection fraction, by estimation, is 55 to 60%. The left ventricle has normal function. The left ventricle has no regional wall motion abnormalities. Left ventricular diastolic parameters are consistent with Grade I diastolic dysfunction (impaired relaxation). Elevated left ventricular end-diastolic pressure. The E/e' is 25. There is abnormal (paradoxical) septal  motion, consistent with right ventricular volume overload.  2. Right ventricular systolic function is normal. The right ventricular size is normal. There is severely elevated pulmonary artery systolic pressure. The estimated right ventricular systolic pressure is 67.1 mmHg.  3. Left atrial size was moderately dilated.  4. The mitral valve is abnormal. Mild mitral valve regurgitation. Mild mitral stenosis. The mean mitral valve gradient is 6.0 mmHg with average heart rate of 90 bpm. Moderate mitral annular calcification.  5. The aortic valve is tricuspid. Aortic valve regurgitation is not visualized. Mild to moderate aortic valve stenosis. Aortic valve area, by VTI measures 1.38 cm. Aortic valve mean gradient measures 15.0 mmHg. Aortic valve Vmax measures 2.51 m/s. DI is 0.49.  6. The inferior vena cava is dilated in size with <50% respiratory variability, suggesting right atrial pressure of 15 mmHg. Comparison(s): Changes from prior study are noted. 07/04/2021: LVEF 50-55%, moderaet LVH, grade 1 DD, RVSP 61.5 mmHg, mild LAE, mild AS - mean gradient 9 mmHg. FINDINGS  Left Ventricle: Left ventricular ejection fraction, by estimation, is 55 to 60%. The left ventricle has normal function. The left ventricle has no regional wall motion abnormalities. The left ventricular internal cavity size was normal in size. There is  no left ventricular hypertrophy. Abnormal (paradoxical) septal motion, consistent with right ventricular volume overload. Left ventricular diastolic parameters are consistent with Grade I diastolic dysfunction (impaired relaxation). Elevated left ventricular end-diastolic pressure. The E/e' is 25. Right Ventricle: The right ventricular size is normal. No increase in right ventricular wall thickness. Right ventricular systolic function is normal. There is severely elevated pulmonary artery systolic pressure. The tricuspid regurgitant velocity is 3.61 m/s, and with an assumed right atrial pressure of 15  mmHg, the estimated right ventricular  systolic pressure is 67.1 mmHg. Left Atrium: Left atrial size was moderately dilated. Right Atrium: Right atrial size was normal in size. Pericardium: There is no evidence of pericardial effusion. Mitral Valve: The mitral valve is abnormal. Moderate mitral annular calcification. Mild mitral valve regurgitation. Mild mitral valve stenosis. The mean mitral valve gradient is 6.0 mmHg with average heart rate of 90 bpm. Tricuspid Valve: The tricuspid valve is grossly normal. Tricuspid valve regurgitation is mild. Aortic Valve: The aortic valve is tricuspid. Aortic valve regurgitation is not visualized. Mild to moderate aortic stenosis is present. Aortic valve mean gradient measures 15.0 mmHg. Aortic valve peak gradient measures 25.2 mmHg. Aortic valve area, by VTI measures 1.38 cm. Pulmonic Valve: The pulmonic valve was grossly normal. Pulmonic valve regurgitation is trivial. Aorta: The aortic root and ascending aorta are structurally normal, with no evidence of dilitation. Venous: The inferior vena cava is dilated in size with less than 50% respiratory variability, suggesting right atrial pressure of 15 mmHg. IAS/Shunts: No atrial level shunt detected by color flow Doppler.  LEFT VENTRICLE PLAX 2D LVIDd:         4.50 cm   Diastology LVIDs:         3.20 cm   LV e' medial:    5.33 cm/s LV PW:         1.10 cm   LV E/e' medial:  26.5 LV IVS:        1.00 cm   LV e' lateral:   5.87 cm/s LVOT diam:     1.90 cm   LV E/e' lateral: 24.0 LV SV:         69 LV SV Index:   41 LVOT Area:     2.84 cm  RIGHT VENTRICLE             IVC RV S prime:     13.30 cm/s  IVC diam: 2.30 cm TAPSE (M-mode): 2.1 cm LEFT ATRIUM              Index        RIGHT ATRIUM           Index LA diam:        4.30 cm  2.55 cm/m   RA Area:     13.80 cm LA Vol (A2C):   101.0 ml 59.78 ml/m  RA Volume:   32.00 ml  18.94 ml/m LA Vol (A4C):   79.3 ml  46.94 ml/m LA Biplane Vol: 91.8 ml  54.34 ml/m  AORTIC VALVE AV Area  (Vmax):    1.35 cm AV Area (Vmean):   1.29 cm AV Area (VTI):     1.38 cm AV Vmax:           251.00 cm/s AV Vmean:          181.000 cm/s AV VTI:            0.501 m AV Peak Grad:      25.2 mmHg AV Mean Grad:      15.0 mmHg LVOT Vmax:         119.50 cm/s LVOT Vmean:        82.200 cm/s LVOT VTI:          0.243 m LVOT/AV VTI ratio: 0.49  AORTA Ao Root diam: 2.60 cm Ao Asc diam:  3.20 cm MITRAL VALVE                TRICUSPID VALVE MV Area (PHT): 4.68 cm     TR  Peak grad:   52.1 mmHg MV Mean grad:  6.0 mmHg     TR Vmax:        361.00 cm/s MV Decel Time: 162 msec MV E velocity: 141.00 cm/s  SHUNTS MV A velocity: 166.00 cm/s  Systemic VTI:  0.24 m MV E/A ratio:  0.85         Systemic Diam: 1.90 cm Vinie Maxcy MD Electronically signed by Vinie Maxcy MD Signature Date/Time: 10/19/2021/12:25:55 PM    Final    Medications:   azithromycin   250 mg Oral Daily   benzonatate   200 mg Oral TID   Chlorhexidine  Gluconate Cloth  6 each Topical Q0600   [START ON 10/22/2021] cinacalcet   30 mg Oral Q T,Th,Sa-HD   cloNIDine   0.1 mg Oral BID   clopidogrel   75 mg Oral Daily   [START ON 10/22/2021] doxercalciferol   5 mcg Intravenous Q T,Th,Sa-HD   ferric citrate   210 mg Oral TID WC   insulin  aspart  0-5 Units Subcutaneous QHS   insulin  aspart  0-9 Units Subcutaneous TID WC   [START ON 10/22/2021] methylPREDNISolone  (SOLU-MEDROL ) injection  40 mg Intravenous Daily   NIFEdipine   60 mg Oral QHS   pantoprazole   40 mg Oral Daily   simvastatin   20 mg Oral QHS   traZODone   50 mg Oral QHS    OP HD: AF TTS   3.5h  400/500  56.5kg  2/2 bath P2 LUA AVG Heparin  none   - 3/25 > hep B Ag neg, hep B Ab's+/ protective   - leaving 59kg last few  - mircera 150 q2, last 3/18, due 4/1  - sensipar  30 tiw  - hectorol  5 ug tiw     CXR 3/25 - Cardiac shadow is enlarged but stable. Aortic calcifications are again seen. Lungs are well aerated bilaterally. Elevation of the right hemidiaphragm is again noted with mild right basilar  atelectasis. No focal confluent infiltrate is seen    Na 142 K 4.6 CO2 29 BUN 48 Cr 6.10   Assessment/ Plan: Acute hypercapneic resp failure - required BVM w/ EMS then Bipap in ED. Pt responded to solumedrol, Mg, atrovent and was weaned to [facility] in ED yesterday. Likely COPD (+/- vol overload). CXR no edema. We did HD then later in the afternoon which was well tolerated, got 3 L UF. Close to dry wt today.  AMS - likely due to CO2 retention, improved/ resolved. Ox 3 ESRD - on HD TTS. Had not missed HD. Had HD here Saturday as above. Next HD 3/28.  BP/ volume - no edema per CXR. Wt's close to dry wt post HD yest. H/O cyptogenic CVA. Keep SBP >120.  Anemia esrd - Hb 11.2 Next esa due 4/1.  MBD ckd - CCa and phos in range, cont auyxia as binder and cont vdra IV and sensipar  po w/ HD.  Nutrition: Albumin  low. Add protein supss. H/O Cryptogenic CVA    Gladies Sofranko H. Estera Ozier NP-C 10/21/2021, 11:31 AM  [employer] [phone]

## 2021-10-21 NOTE — Care Management Important Message (Signed)
Important Message ? ?Patient Details  ?Name: Stacey Dorsey ?MRN: [id number] ?Date of Birth: [birth year] ? ? ?Medicare Important Message Given:  Yes ? ? ? ? ?Keiri Solano ?10/21/2021, 2:43 PM ?

## 2021-10-21 NOTE — Progress Notes (Signed)
? ? ? [facility] ?                                                                            ? ? 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 ?LOS - 3  days ? ? ? ?Brief summary  ? ?Patient is a 88 year old female with ESRD on HD TTS, COPD, diabetes mellitus, hypertension, hyperlipidemia, chronic diastolic CHF presented with acute hypoxic and hypercapnic respiratory failure.  EMS was called for altered mental status.  Per family, patient had 1 to 2 days of progressive altered mental status.,  Increased somnolence.  Patient has been experiencing new onset rhinitis, rhinorrhea and productive cough over the last 2 days.  She also reported generalized weakness. ? ?Patient was found to have acute respiratory acidosis with hypercapnic/hypoxic respiratory failure.  Initially placed on O2 10 L NRB by EMS, subsequently transitioned to BiPAP in ED. ? ? ? ? ?Assessment & Plan  ? ? Acute respiratory failure with hypoxia and hypercapnia (HCC)  - Known history of COPD, initial O2 sats in 70s on room air, was placed on 10 L NRB.  ABG showed acute respiratory acidosis with pH of 7.1, PCO2 87.3, she briefly required BiPAP. ? ?She was started on IV steroids, IV azithromycin along with nebulizer treatments, she is overall much improved with minimal wheezing down to 2 L nasal cannula oxygen, off of BiPAP.  Pulmonary standpoint she is doing fairly well but overall feels quite weak and wants to stay another day in the hospital.  Will advance activity, I-S and flutter valve will be added, encouraged to sit up in chair in daytime.  Continue tapering steroids, titrate off oxygen likely discharge tomorrow. ? ? ?Acute encephalopathy - Likely due to hypercapnic respiratory failure, patient was placed on BiPAP, resolved. ? ? ?ESRD (end stage renal disease) ([place])  - On HD, TTS schedule.  Renal on board. ? ?Chronic  diastolic CHF (congestive heart failure) (HCC) - - Currently appears to be euvolemic.  Elevated troponin due to hypoxia and demand ischemia, echocardiogram shows EF 55 to 60% without any new wall motion abnormality. ? ?Finding of moderate AS on echocardiogram.  Outpatient cardiology follow-up to be arranged by PCP. ? ?Lactic acidosis  - -Likely due to acute respiratory acidosis and hypoxia, dehydration.  No sepsis.  ? ?History of anemia due to chronic kidney disease  -Baseline hemoglobin 9-11, stable. ? ?Hyperlipidemia  -Continue statin. ? ?Generalized weakness - PT OT evaluation ? ?Diabetes mellitus without complication ([place]) - WPY0D 5.8 in 06/2021 . ISS ?  ?CBG (last 3)  ?Recent Labs  ?  10/20/21 ?1721 10/20/21 ?2205 10/21/21 ?0801  ?GLUCAP 327* 268* 258*  ? ? ?   ? ?Code Status: Full code ?DVT Prophylaxis:  SCDs Start: 10/18/21 2209 ? ? ?Level of Care: Level of care: Progressive--> changed to med telemetry ?Family Communication: called patient's daughter, Ms Michaelle Copas and updated in detail. ?Disposition Plan:     Remains inpatient appropriate: DC in am 10/22/21 ? ?Procedures:  ?Hemodialysis ? ?Consultants:   ?Nephrology ? ?Antimicrobials:  ? ?  Anti-infectives (From admission, onward)  ? ? Start     Dose/Rate Route Frequency Ordered Stop  ? 10/21/21 1000  azithromycin (ZITHROMAX) tablet 250 mg       ? 250 mg Oral Daily 10/21/21 0844 10/25/21 0959  ? 10/18/21 2245  azithromycin (ZITHROMAX) 500 mg in sodium chloride 0.9 % 250 mL IVPB  Status:  Discontinued       ? 500 mg ?250 mL/hr over 60 Minutes Intravenous Every 24 hours 10/18/21 2233 10/21/21 0844  ? ?  ? ? ? ?Medications ? ? azithromycin  250 mg Oral Daily  ? benzonatate  200 mg Oral TID  ? Chlorhexidine Gluconate Cloth  6 each Topical Q0600  ? [START ON 10/22/2021] cinacalcet  30 mg Oral Q T,Th,Sa-HD  ? cloNIDine  0.1 mg Oral BID  ? clopidogrel  75 mg Oral Daily  ? [START ON 10/22/2021] doxercalciferol  5 mcg Intravenous Q T,Th,Sa-HD  ? ferric citrate  210  mg Oral TID WC  ? insulin aspart  0-5 Units Subcutaneous QHS  ? insulin aspart  0-9 Units Subcutaneous TID WC  ? [START ON 10/22/2021] methylPREDNISolone (SOLU-MEDROL) injection  40 mg Intravenous Daily  ? NIFEdipine  60 mg Oral QHS  ? pantoprazole  40 mg Oral Daily  ? simvastatin  20 mg Oral QHS  ? traZODone  50 mg Oral QHS  ? ? ? ?Subjective:  ? ?Patient in bed, appears comfortable, denies any headache, no fever, no chest pain or pressure, improved shortness of breath on 2 lits o2 , no abdominal pain. No new focal weakness. Overall not feeling great this am, does not want to go home. ? ? ?Objective:  ? ?Vitals:  ? 10/21/21 0458 10/21/21 0500 10/21/21 0524 10/21/21 0758  ?BP: (!) 83/50  (!) 125/59 (!) 122/55  ?Pulse: 90  82 84  ?Resp: 20  (!) 21 18  ?Temp: 98.3 ?F (36.8 ?C)   98.1 ?F (36.7 ?C)  ?TempSrc: Oral   Oral  ?SpO2: 98%   100%  ?Weight:  59.6 kg    ?Height:      ? ? ?Intake/Output Summary (Last 24 hours) at 10/21/2021 1038 ?Last data filed at 10/21/2021 0844 ?Gross per 24 hour  ?Intake 600 ml  ?Output --  ?Net 600 ml  ? ?Filed Weights  ? 10/19/21 1438 10/19/21 1830 10/21/21 0500  ?Weight: 61.3 kg 58.3 kg 59.6 kg  ? ? ? ?Exam ? ?Awake Alert, No new F.N deficits, Normal affect ?[place].AT,PERRAL ?Supple Neck, No JVD,   ?Symmetrical Chest wall movement, Good air movement bilaterally, CTAB ?RRR,No Gallops, Rubs or new Murmurs,  ?+ve B.Sounds, Abd Soft, No tenderness,   ?No Cyanosis, Clubbing or edema  ? ?Data Reviewed:  I have personally reviewed following labs  ? ? ?CBC ?Lab Results  ?Component Value Date  ? WBC 9.9 10/19/2021  ? RBC 3.09 (L) 10/19/2021  ? HGB 11.2 (L) 10/19/2021  ? HCT 33.0 (L) 10/19/2021  ? MCV 95.5 10/19/2021  ? MCH 29.1 10/19/2021  ? PLT 187 10/19/2021  ? MCHC 30.5 10/19/2021  ? RDW 15.9 (H) 10/19/2021  ? LYMPHSABS 0.7 10/19/2021  ? MONOABS 0.3 10/19/2021  ? EOSABS 0.0 10/19/2021  ? BASOSABS 0.0 10/19/2021  ? ? ? ?Last metabolic panel ?Lab Results  ?Component Value Date  ? NA 141 10/19/2021  ? K  4.6 10/19/2021  ? CL 102 10/19/2021  ? CO2 29 10/19/2021  ? BUN 48 (H) 10/19/2021  ? CREATININE 6.10 (H) 10/19/2021  ? GLUCOSE  154 (H) 10/19/2021  ? GFRNONAA 6 (L) 10/19/2021  ? CALCIUM 8.9 10/19/2021  ? PHOS 3.6 10/19/2021  ? PROT 6.7 10/19/2021  ? ALBUMIN 2.8 (L) 10/19/2021  ? BILITOT 0.6 10/19/2021  ? ALKPHOS 96 10/19/2021  ? AST 23 10/19/2021  ? ALT 16 10/19/2021  ? ANIONGAP 11 10/19/2021  ? ? ?CBG (last 3)  ?Recent Labs  ?  10/20/21 ?1721 10/20/21 ?2205 10/21/21 ?0801  ?GLUCAP 327* 268* 258*  ?  ? ? ?Coagulation Profile: ?Recent Labs  ?Lab 10/19/21 ?0341  ?INR 1.0  ? ? ? ?Radiology Studies: I have personally reviewed the imaging studies  ?DG Chest Portable 1 View ? ?Result Date: 10/18/2021 ? IMPRESSION: Mild right basilar atelectasis. Electronically Signed   By: Inez Catalina M.D.   On: 10/18/2021 20:07  ? ?ECHOCARDIOGRAM COMPLETE ? ?Result Date: 10/19/2021 ?IMPRESSIONS  1. Left ventricular ejection fraction, by estimation, is 55 to 60%. The left ventricle has normal function. The left ventricle has no regional wall motion abnormalities. Left ventricular diastolic parameters are consistent with Grade I diastolic dysfunction (impaired relaxation). Elevated left ventricular end-diastolic pressure. The E/e' is 25. There is abnormal (paradoxical) septal motion, consistent with right ventricular volume overload.  2. Right ventricular systolic function is normal. The right ventricular size is normal. There is severely elevated pulmonary artery systolic pressure. The estimated right ventricular systolic pressure is 85.6 mmHg.  3. Left atrial size was moderately dilated.  4. The mitral valve is abnormal. Mild mitral valve regurgitation. Mild mitral stenosis. The mean mitral valve gradient is 6.0 mmHg with average heart rate of 90 bpm. Moderate mitral annular calcification.  5. The aortic valve is tricuspid. Aortic valve regurgitation is not visualized. Mild to moderate aortic valve stenosis. Aortic valve area, by VTI  measures 1.38 cm?Marland Kitchen Aortic valve mean gradient measures 15.0 mmHg. Aortic valve Vmax measures 2.51 m/s. DI is 0.49.  6. The inferior vena cava is dilated in size with <50% respiratory variability, suggesting

## 2021-10-21 NOTE — Progress Notes (Signed)
Patient Glucose checked glucose was 30, juice given and have an amp of dextrose initiated. During push of dextrose recheck of patients glucose level was 230. Rechecked an hour later was 186. Provider notified of event. ?

## 2021-10-21 NOTE — Progress Notes (Signed)
Pt is for possible d/c tomorrow am per MD note. Contacted treatment team via secure chat regarding possible d/c by 9:30 so pt could receive HD as out-pt to avoid day of d/c HD. Met with pt at bedside. Pt is concerned that she will feel weak in the morning and not be able to make it to out-pt HD. Pt also states that she is not sure if pt's daughter would be able to to be at hospital by 9:30. Pt agreeable to navigator contacting pt's daughter to discuss the above. Pt also mentioned about going to stay with sister in [place] in the future. Spoke to pt's daughter via phone. Daughter aware of possible early d/c if pt medically stable in the morning. Daughter feels she could possibly be here by 9:15-9:20. Pt's daughter states that pt has not mentioned to her the option of pt staying with pt's sister in the future. Update provided to treatment team regarding pt/daughter responses via secure chat this afternoon. Spoke to staff at [facility]) and pt is on the schedule for tomorrow. Also contacted inpt HD staff to request that pt be placed on 2nd shift in the event pt is unable to be d/c early tomorrow morning.  ? ?Melven Sartorius ?Renal Navigator ?[phone] ?

## 2021-10-21 NOTE — Progress Notes (Signed)
SATURATION QUALIFICATIONS: (This note is used to comply with regulatory documentation for home oxygen) ? ?Patient Saturations on Room Air at Rest = 98% ? ?Patient Saturations on Room Air while Ambulating = 98% ? ? ? ?Patient does not need oxygen when ambulating. ? ? ?Arby Barrette, PT ?Acute Rehabilitation Services  ?Pager [phone] ?Office [phone] ? ?

## 2021-10-21 NOTE — Progress Notes (Signed)
Physical Therapy Treatment ?Patient Details ?Name: Stacey Dorsey ?MRN: [id number] ?DOB: [birth year] ?Today's Date: 10/21/2021 ? ? ?History of Present Illness Patient is a 88 year old female  presented with acute hypoxic and hypercapnic respiratory failure.  EMS was called for altered mental status.  Per family, patient had 1 to 2 days of progressive altered mental status.,  Increased somnolence.  Patient has been experiencing new onset rhinitis, rhinorrhea and productive cough over the last 2 days.  She also reported generalized weakness. with ESRD on HD TTS, COPD, diabetes mellitus, hypertension, hyperlipidemia, chronic diastolic CHF ? ?  ?PT Comments  ? ? Patient mobilizing well with RW; reports she does not feel strong enough to walk without RW. Oxygen saturation remained 98% on room air while walking. Min cues for safe use of RW during ambulation and for maneuvering in tight spaces in her room.  ?   ?Recommendations for follow up therapy are one component of a multi-disciplinary discharge planning process, led by the attending physician.  Recommendations may be updated based on patient status, additional functional criteria and insurance authorization. ? ?Follow Up Recommendations ? No PT follow up ?  ?  ?Assistance Recommended at Discharge Set up Supervision/Assistance  ?Patient can return home with the following A little help with walking and/or transfers;Help with stairs or ramp for entrance ?  ?Equipment Recommendations ? None recommended by PT  ?  ?Recommendations for Other Services   ? ? ?  ?Precautions / Restrictions Precautions ?Precautions: Fall ?Precaution Comments: Fall risk present, but minimal, especially with use of RW ?Restrictions ?Weight Bearing Restrictions: No  ?  ? ?Mobility ? Bed Mobility ?Overal bed mobility: Modified Independent ?  ?  ?  ?  ?  ?  ?  ?  ? ?Transfers ?Overall transfer level: Needs assistance ?Equipment used: Rolling walker (2 wheels) ?Transfers: Sit to/from Stand ?Sit to Stand:  Supervision ?  ?  ?  ?  ?  ?General transfer comment: vc for safe use of RW ?  ? ?Ambulation/Gait ?Ambulation/Gait assistance: Min guard (without phsyical contact) ?Gait Distance (Feet): 200 Feet ?Assistive device: Rolling walker (2 wheels) ?Gait Pattern/deviations: Step-through pattern ?Gait velocity: approaching WNL ?  ?  ?General Gait Details: Good use of RW for steadiness; did not feel she could try without RW due to generalized weakness ? ? ?Stairs ?  ?  ?  ?  ?  ? ? ?Wheelchair Mobility ?  ? ?Modified Rankin (Stroke Patients Only) ?  ? ? ?  ?Balance Overall balance assessment: Needs assistance ?  ?Sitting balance-Leahy Scale: Normal ?  ?  ?  ?Standing balance-Leahy Scale: Fair ?  ?  ?  ?  ?  ?  ?  ?  ?  ?  ?  ?  ?  ? ?  ?Cognition Arousal/Alertness: Awake/alert ?Behavior During Therapy: [facility] for tasks assessed/performed ?Overall Cognitive Status: No family/caregiver present to determine baseline cognitive functioning ?  ?  ?  ?  ?  ?  ?  ?  ?  ?  ?  ?  ?  ?  ?  ?  ?General Comments: ?safety awareness reporting she was up in the room by herself earlier today (?accurate) ?  ?  ? ?  ?Exercises   ? ?  ?General Comments General comments (skin integrity, edema, etc.): on room air with sats 98% ?  ?  ? ?Pertinent Vitals/Pain Pain Assessment ?Pain Assessment: No/denies pain  ? ? ?Home Living Family/patient expects to be discharged to:: Private residence ?  Living Arrangements: Children ?Available Help at Discharge: Family ?Type of Home: House ?Home Access: Stairs to enter ?Entrance Stairs-Rails: None ?Entrance Stairs-Number of Steps: 2 ?  ?Home Layout: Two level;Able to live on main level with bedroom/bathroom ?Home Equipment: Conservation officer, nature (2 wheels);Cane - single point;Hand held shower head;Grab bars - tub/shower;Shower seat ?   ?  ?Prior Function    ?  ?  ?   ? ?PT Goals (current goals can now be found in the care plan section) Acute Rehab PT Goals ?Patient Stated Goal: wants to feel better, not so weak ?Time For  Goal Achievement: 11/03/21 ?Potential to Achieve Goals: Good ?Progress towards PT goals: Progressing toward goals ? ?  ?Frequency ? ? ? Min 3X/week ? ? ? ?  ?PT Plan Current plan remains appropriate  ? ? ?Co-evaluation   ?  ?  ?  ?  ? ?  ?AM-PAC PT "6 Clicks" Mobility   ?Outcome Measure ? Help needed turning from your back to your side while in a flat bed without using bedrails?: None ?Help needed moving from lying on your back to sitting on the side of a flat bed without using bedrails?: None ?Help needed moving to and from a bed to a chair (including a wheelchair)?: None ?Help needed standing up from a chair using your arms (e.g., wheelchair or bedside chair)?: A Little ?Help needed to walk in hospital room?: A Little ?Help needed climbing 3-5 steps with a railing? : A Little ?6 Click Score: 21 ? ?  ?End of Session   ?Activity Tolerance: Patient tolerated treatment well ?Patient left: with call bell/phone within reach;in chair ?Nurse Communication: Mobility status ?PT Visit Diagnosis: Unsteadiness on feet (R26.81) ?  ? ? ?Time: 4680-3212 ?PT Time Calculation (min) (ACUTE ONLY): 17 min ? ?Charges:  $Gait Training: 8-22 mins          ?          ? ? ?Arby Barrette, PT ?Acute Rehabilitation Services  ?Pager [phone] ?Office [phone] ? ? ? ?Jeanie Cooks Marlie Kuennen ?10/21/2021, 1:39 PM ? ?

## 2021-10-21 NOTE — Progress Notes (Signed)
Inpatient Diabetes Program Recommendations ? ?AACE/ADA: New Consensus Statement on Inpatient Glycemic Control (2015) ? ?Target Ranges:  Prepandial:   less than 140 mg/dL ?     Peak postprandial:   less than 180 mg/dL (1-2 hours) ?     Critically ill patients:  140 - 180 mg/dL  ? ?Lab Results  ?Component Value Date  ? GLUCAP 261 (H) 10/21/2021  ? HGBA1C 6.1 (H) 10/19/2021  ? ? ?Review of Glycemic Control ? Latest Reference Range & Units 10/20/21 08:10 10/20/21 11:39 10/20/21 17:21 10/20/21 22:05 10/21/21 08:01 10/21/21 11:39  ?Glucose-Capillary 70 - 99 mg/dL 240 (H) 208 (H) 327 (H) 268 (H) 258 (H) 261 (H)  ?(H): Data is abnormally high ? ?Diabetes history: DM2 ?Outpatient Diabetes medications: None ?Current orders for Inpatient glycemic control: Novolog 0-9 units TID & 0-5 QHS, Solumedrol 40 mg QD (decreased from 80 mg) ? ?Inpatient Diabetes Program Recommendations:   ? ?Novolog 0-15 units TID  ? ?Will continue to follow while inpatient. ? ?Thank you, ?Reche Dixon, MSN, RN ?Diabetes Coordinator ?Inpatient Diabetes Program ?[phone] (team pager from 8a-5p) ? ? ? ?

## 2021-10-22 ENCOUNTER — Other Ambulatory Visit (HOSPITAL_COMMUNITY): Payer: Self-pay

## 2021-10-22 LAB — HEPATITIS B SURFACE ANTIBODY, QUANTITATIVE: Hep B S AB Quant (Post): 152.1 m[IU]/mL (ref >9.9)

## 2021-10-22 LAB — GLUCOSE, CAPILLARY
Glucose-Capillary: 125 mg/dL — ABNORMAL HIGH (ref 70–99)
Glucose-Capillary: 149 mg/dL — ABNORMAL HIGH (ref 70–99)

## 2021-10-22 MED ORDER — METHYLPREDNISOLONE SODIUM SUCC 40 MG IJ SOLR: 20.0000 mg | Freq: Every day | INTRAMUSCULAR | Status: DC

## 2021-10-22 MED ORDER — METHYLPREDNISOLONE 4 MG PO TBPK
ORAL_TABLET | ORAL | 0 refills | Status: DC
  Filled 2021-10-22: qty 21, 6d supply, fill #0

## 2021-10-22 MED ORDER — AZITHROMYCIN 250 MG PO TABS
250.0000 mg | ORAL_TABLET | Freq: Every day | ORAL | 0 refills | Status: DC
  Filled 2021-10-22: qty 2, 2d supply, fill #0

## 2021-10-22 NOTE — TOC Transition Note (Signed)
Transition of Care (TOC) - CM/SW Discharge Note ? ? ?Patient Details  ?Name: Stacey Dorsey ?MRN: [id number] ?Date of Birth: Aug 23, 1932 ? ?Transition of Care (TOC) CM/SW Contact:  ?Carles Collet, RN ?Phone Number: ?10/22/2021, 10:19 AM ? ? ?Clinical Narrative:   Spoke with patient's daughter to discuss follow up with OP OT at [facility]. ?Appreciative of referral, understands they will be getting a call from them to schedule. ?hargrove,cheryl (Daughter)  ?[phone] ? ? ? ? ? ?  ?  ? ? ?Patient Goals and CMS Choice ?  ?  ?  ? ?Discharge Placement ?  ?           ?  ?  ?  ?  ? ?Discharge Plan and Services ?  ?  ?           ?  ?  ?  ?  ?  ?  ?  ?  ?  ?  ? ?Social Determinants of Health (SDOH) Interventions ?  ? ? ?Readmission Risk Interventions ?   ? View : No data to display.  ?  ?  ?  ? ? ? ? ? ?

## 2021-10-22 NOTE — Discharge Summary (Signed)
?                                                                                ? Service Gerrard ZOX:[id number] DOB: [birth year] DOA: 10/18/2021 ? ?PCP: Bartholome Bill, MD ? ?Admit date: 10/18/2021  Discharge date: 10/22/2021 ? ?Admitted From: Home   Disposition:  Home ? ? ?Recommendations for Outpatient Follow-up:  ? ?Follow up with PCP in 1-2 weeks ? ?PCP Please obtain BMP/CBC, 2 view CXR in 1week,  (see Discharge instructions)  ? ?PCP Please follow up on the following pending results:  ? ? ?Home Health: None   ?Equipment/Devices: as below  ?Consultations: Renal ?Discharge Condition: Stable    ?CODE STATUS: Full    ?Diet Recommendation: Renal-low carbohydrate diet with 1.5 L fluid restriction per day. ? ? ?CC - SOB ? ?Brief history of present illness from the day of admission and additional interim summary   ? ?88 year old female with ESRD on HD TTS, COPD, diabetes mellitus, hypertension, hyperlipidemia, chronic diastolic CHF presented with acute hypoxic and hypercapnic respiratory failure.  EMS was called for altered mental status.  Per family, patient had 1 to 2 days of progressive altered mental status.,  Increased somnolence.  Patient has been experiencing new onset rhinitis, rhinorrhea and productive cough over the last 2 days.  She also reported generalized weakness. ?  ?Patient was found to have acute respiratory acidosis with hypercapnic/hypoxic respiratory failure.  Initially placed on O2 10 L NRB by EMS, subsequently transitioned to BiPAP in ED. ? ?                                                               Hospital Course  ? ? ? Acute respiratory failure with hypoxia and hypercapnia (HCC)  - Known history of COPD, initial O2 sats in 70s on room air, was placed on 10 L NRB.  ABG showed acute respiratory acidosis with pH of 7.1, PCO2 87.3, she briefly required BiPAP. ?  ?She was started on IV steroids, IV  azithromycin along with nebulizer treatments, she is overall much improved with minimal wheezing down to 2 L nasal cannula oxygen, off of BiPAP.  Pulmonary standpoint she is doing fairly well but overall feels quite weak and wants to stay another day in the hospital.  Will advance activity, I-S and flutter valve will be added, encouraged to sit up in chair in daytime.  Continue tapering steroids, has been titrated off oxygen , at baseline DC home with short steroid taper and 2 more days of azithromycin. ?  ?  ?Acute encephalopathy - Likely due to hypercapnic respiratory failure, patient was placed on BiPAP, resolved. ?  ?  ?ESRD (end stage renal disease) ([place])  - On HD, TTS schedule.  Renal on board. ?  ?Chronic diastolic CHF (congestive heart failure) (HCC) - - Currently appears to be euvolemic.  Elevated troponin due to hypoxia and demand ischemia, echocardiogram shows EF 55 to 60% without any new wall motion  abnormality. ?  ?Finding of moderate AS on echocardiogram.  Outpatient cardiology follow-up to be arranged by PCP. ?  ?Lactic acidosis  - -Likely due to acute respiratory acidosis and hypoxia, dehydration.  No sepsis.  ?  ?History of anemia due to chronic kidney disease  -Baseline hemoglobin 9-11, stable. ?  ?Hyperlipidemia  -Continue statin. ?  ?Generalized weakness - PT OT evaluation ?  ?Diabetes mellitus without complication ([place]) - ZDG3O 5.8 in 06/2021 - diet controlled ? ?Discharge diagnosis   ? ? ?Principal Problem: ?  Acute respiratory failure with hypoxia and hypercapnia (HCC) ?Active Problems: ?  Acute encephalopathy ?  Lactic acidosis ?  Chronic diastolic CHF (congestive heart failure) ([place]) ?  Diabetes mellitus without complication ([place]) ?  ESRD (end stage renal disease) ([place]) ?  Generalized weakness ?  Hyperlipidemia ?  History of anemia due to chronic kidney disease ? ? ? ?Discharge instructions   ? ?Discharge Instructions   ? ? Discharge instructions   Complete by: As directed ?  ? Follow with  Primary MD Bartholome Bill, MD in 7 days  ? ?Get CBC, CMP, 2 view Chest X ray -  checked next visit within 1 week by Primary MD   ? ?Activity: As tolerated with Full fall precautions use walker/cane & assistance as needed ? ?Disposition Home  ? ?Diet: Renal Low Carb, 1.5 lit/day fluid restriction ? ?Special Instructions: If you have smoked or chewed Tobacco  in the last 2 yrs please stop smoking, stop any regular Alcohol  and or any Recreational drug use. ? ?On your next visit with your primary care physician please Get Medicines reviewed and adjusted. ? ?Please request your Prim.MD to go over all Hospital Tests and Procedure/Radiological results at the follow up, please get all Hospital records sent to your Prim MD by signing hospital release before you go home. ? ?If you experience worsening of your admission symptoms, develop shortness of breath, life threatening emergency, suicidal or homicidal thoughts you must seek medical attention immediately by calling 911 or calling your MD immediately  if symptoms less severe. ? ?You Must read complete instructions/literature along with all the possible adverse reactions/side effects for all the Medicines you take and that have been prescribed to you. Take any new Medicines after you have completely understood and accpet all the possible adverse reactions/side effects.  ? Increase activity slowly   Complete by: As directed ?  ? ?  ? ? ?Discharge Medications  ? ?Allergies as of 10/22/2021   ? ?   Reactions  ? Penicillins Rash  ? Prednisone Other (See Comments)  ? insomnia  ? Scallops [shellfish Allergy]   ? Threw up blood ?Scallops only - can eat other shellfish  ? Tramadol Anxiety  ? ?  ? ?  ?Medication List  ?  ? ?TAKE these medications   ? ?acetaminophen 500 MG tablet ?Commonly known as: TYLENOL ?Take 2 tablets (1,000 mg total) by mouth 3 (three) times daily. For 3 to 5 days and then can consider changing it to as needed based on improvement of her musculoskeletal  chest pain related to rib fractures. ?What changed:  ?when to take this ?reasons to take this ?additional instructions ?  ?Advair HFA 115-21 MCG/ACT inhaler ?Generic drug: fluticasone-salmeterol ?Inhale 1 puff into the lungs 2 (two) times daily. ?What changed:  ?when to take this ?reasons to take this ?  ?albuterol (2.5 MG/3ML) 0.083% nebulizer solution ?Commonly known as: PROVENTIL ?Inhale 2.5 mg into the lungs every  4 (four) hours as needed for wheezing or shortness of breath. ?  ?albuterol 108 (90 Base) MCG/ACT inhaler ?Commonly known as: VENTOLIN HFA ?Inhale 2 puffs into the lungs every 4 (four) hours as needed for shortness of breath. ?  ?azithromycin 250 MG tablet ?Commonly known as: ZITHROMAX ?Take 1 tablet (250 mg total) by mouth daily. ?  ?carvedilol 25 MG tablet ?Commonly known as: COREG ?Take 1 tablet (25 mg total) by mouth See admin instructions. Take 25 mg twice daily on nondailysis days (Sun, Mon, Wed, and Fri) ?What changed:  ?when to take this ?additional instructions ?  ?cinacalcet 30 MG tablet ?Commonly known as: SENSIPAR ?Take 1 tablet (30 mg total) by mouth Every Tuesday,Thursday,and Saturday with dialysis. ?  ?cloNIDine 0.1 MG tablet ?Commonly known as: CATAPRES ?Take 1 tablet (0.1 mg total) by mouth 2 (two) times daily. ?  ?clopidogrel 75 MG tablet ?Commonly known as: PLAVIX ?Take 75 mg by mouth daily. ?  ?docusate sodium 100 MG capsule ?Commonly known as: COLACE ?Take 1 capsule (100 mg total) by mouth 2 (two) times daily. ?  ?doxercalciferol 4 MCG/2ML injection ?Commonly known as: HECTOROL ?Inject 2.5 mLs (5 mcg total) into the vein Every Tuesday,Thursday,and Saturday with dialysis. ?  ?ferric citrate 1 GM 210 MG(Fe) tablet ?Commonly known as: AURYXIA ?Take 1 tablet (210 mg total) by mouth 3 (three) times daily with meals. ?  ?lidocaine 5 % ?Commonly known as: LIDODERM ?Place 2 patches onto the skin daily. Remove & Discard patch within 12 hours or as directed by MD ?  ?lidocaine-prilocaine  cream ?Commonly known as: EMLA ?Apply 1 application topically 3 (three) times a week. ?  ?methylPREDNISolone 4 MG Tbpk tablet ?Commonly known as: MEDROL DOSEPAK ?follow package directions ?  ?mirtazapine 15 MG

## 2021-10-22 NOTE — Evaluation (Addendum)
Occupational Therapy Evaluation ?Patient Details ?Name: Stacey Dorsey ?MRN: [id number] ?DOB: [birth year] ?Today's Date: 10/22/2021 ? ? ?History of Present Illness Patient is a 88 year old female  presented with acute hypoxic and hypercapnic respiratory failure.  EMS was called for altered mental status.  Per family, patient had 1 to 2 days of progressive altered mental status.,  Increased somnolence.  Patient has been experiencing new onset rhinitis, rhinorrhea and productive cough over the last 2 days.  She also reported generalized weakness. with ESRD on HD TTS, COPD, diabetes mellitus, hypertension, hyperlipidemia, chronic diastolic CHF  ? ?Clinical Impression ?  ?Pt currently min guard for completion of functional transfers to the toilet as well as for toileting tasks and selfcare sit to stand.  She presents with low endurance however O2 sats remained greater than 95% on room air throughout session.  Dyspnea 3/4 at times when standing for approximately one minute while completing peri care and pulling pants over hips.  Feel she will benefit from use of the RW for support with transfers in and out of the bathroom as well as use of a shower seat, which she already has.  Would recommend outpatient OT to continue progression toward modified independent level for selfcare and home management tasks as well as for building up activity tolerance.  No further acute needs secondary to pt discharging.   ?   ? ?Recommendations for follow up therapy are one component of a multi-disciplinary discharge planning process, led by the attending physician.  Recommendations may be updated based on patient status, additional functional criteria and insurance authorization.  ? ?Follow Up Recommendations ? Outpatient OT  ?  ?Assistance Recommended at Discharge Intermittent Supervision/Assistance  ?Patient can return home with the following A little help with walking and/or transfers;A little help with bathing/dressing/bathroom;Assistance  with cooking/housework;Help with stairs or ramp for entrance;Direct supervision/assist for medications management ? ?  ?Functional Status Assessment ? Patient has had a recent decline in their functional status and demonstrates the ability to make significant improvements in function in a reasonable and predictable amount of time.  ?Equipment Recommendations ? None recommended by OT  ?  ?Recommendations for Other Services   ? ? ?  ?Precautions / Restrictions Precautions ?Precautions: Fall ?Precaution Comments: Fall risk present, but minimal, especially with use of RW ?Restrictions ?Weight Bearing Restrictions: No  ? ?  ? ?Mobility Bed Mobility ?  ?  ?  ?  ?  ?  ?  ?  ?  ? ?Transfers ?Overall transfer level: Needs assistance ?Equipment used: None ?Transfers: Sit to/from Stand, Bed to chair/wheelchair/BSC ?Sit to Stand: Supervision ?  ?  ?Step pivot transfers: Min guard ?  ?  ?General transfer comment: No device ?  ? ?  ?Balance Overall balance assessment: Needs assistance ?Sitting-balance support: No upper extremity supported ?Sitting balance-Leahy Scale: Normal ?  ?  ?Standing balance support: During functional activity ?Standing balance-Leahy Scale: Fair ?Standing balance comment: Pt needed at least one UE stabilized on surface during mobiltihy ?  ?  ?  ?  ?  ?  ?  ?  ?  ?  ?  ?   ? ?ADL either performed or assessed with clinical judgement  ? ?ADL Overall ADL's : Needs assistance/impaired ?Eating/Feeding: Independent ?  ?Grooming: Wash/dry hands;Wash/dry face;Set up ?Grooming Details (indicate cue type and reason): sitting at the sink ?  ?  ?Lower Body Bathing: Min guard;Sit to/from stand ?Lower Body Bathing Details (indicate cue type and reason): sitting on the toilet ?Upper Body  Dressing : Supervision/safety;Sitting ?  ?Lower Body Dressing: Min guard;Sit to/from stand ?Lower Body Dressing Details (indicate cue type and reason): no assistive device ?Toilet Transfer: Min guard ?Toilet Transfer Details (indicate cue  type and reason): no assistive device ?Toileting- Clothing Manipulation and Hygiene: Sit to/from stand;Min guard ?Toileting - Clothing Manipulation Details (indicate cue type and reason): sit to stand from regular toilet ?  ?  ?Functional mobility during ADLs: Min guard ?General ADL Comments: Pt with O2 sats remaining greater than 95% on room air with activity.  HR increasing to 105 with bathing and dressing tasks as well.  She exhibits dyspnea 3/4 with standing to manage her LB clothing as well as for washing front and back peri area, requiring rest breaks after less than one minute.  She reports having a shower seat at home but is able to use her grab bar or surface beside the toilet for sit to stand without difficulty.  She completed sit to stand from the lower toilet with min guard throughout toileting and washing/drying peri area.  ? ? ? ?Vision Baseline Vision/History:  (hx of corneal implants per report) ?Ability to See in Adequate Light: 1 Impaired ?Patient Visual Report: Blurring of vision ?Vision Assessment?: No apparent visual deficits ?Additional Comments: Pt reports blurriness over time and need to follow-up with the optometrist since it has been a while.  ?   ?Perception Perception ?Perception: Within Functional Limits ?  ?Praxis Praxis ?Praxis: Intact ?  ? ?Pertinent Vitals/Pain Pain Assessment ?Pain Assessment: No/denies pain  ? ? ? ?Hand Dominance Right ?  ?Extremity/Trunk Assessment Upper Extremity Assessment ?Upper Extremity Assessment: Generalized weakness ?  ?Lower Extremity Assessment ?Lower Extremity Assessment: Defer to PT evaluation ?  ?Cervical / Trunk Assessment ?Cervical / Trunk Assessment: Kyphotic ?  ?Communication Communication ?Communication: No difficulties ?  ?Cognition Arousal/Alertness: Awake/alert ?Behavior During Therapy: [facility] for tasks assessed/performed ?Overall Cognitive Status: No family/caregiver present to determine baseline cognitive functioning ?  ?  ?  ?  ?  ?  ?  ?  ?   ?  ?  ?  ?  ?  ?  ?  ?General Comments: Pt with awareness of balance deficits and reports that she feels more comfortable using the RW or holding onto surfaces during mobility. ?  ?  ?   ?   ?   ? ? ?Home Living Family/patient expects to be discharged to:: Private residence ?Living Arrangements: Children ?Available Help at Discharge: Family ?Type of Home: House ?Home Access: Stairs to enter ?Entrance Stairs-Number of Steps: 2 ?Entrance Stairs-Rails: None ?Home Layout: Two level;Able to live on main level with bedroom/bathroom ?  ?  ?Bathroom Shower/Tub: Walk-in shower ?  ?Bathroom Toilet: Standard ?Bathroom Accessibility: Yes ?  ?Home Equipment: Conservation officer, nature (2 wheels);Cane - single point;Hand held shower head;Grab bars - tub/shower;Shower seat ?  ?  ?  ? ?  ?Prior Functioning/Environment Prior Level of Function : Needs assist ? Cognitive Assist : Mobility (cognitive);ADLs (cognitive) ?  ?  ?  ?  ?  ?Mobility Comments: patient states she walks in/out of the home without an AD; has a RW that she can use when needed; tired after HD, and naps; daughter drives her to/from HD ?ADLs Comments: REports modified independent ?  ? ?  ?  ?OT Problem List: Decreased activity tolerance;Impaired balance (sitting and/or standing);Cardiopulmonary status limiting activity;Decreased strength ?  ?   ?   ?   ?   ? ?   ?AM-PAC OT "6 Clicks"  Daily Activity     ?Outcome Measure Help from another person eating meals?: None ?Help from another person taking care of personal grooming?: None ?Help from another person toileting, which includes using toliet, bedpan, or urinal?: A Little ?Help from another person bathing (including washing, rinsing, drying)?: A Little ?Help from another person to put on and taking off regular upper body clothing?: None ?Help from another person to put on and taking off regular lower body clothing?: A Little ?6 Click Score: 21 ?  ?End of Session Equipment Utilized During Treatment: Gait belt ?Nurse  Communication: Mobility status ? ?Activity Tolerance: Patient limited by fatigue ?Patient left: in chair;with call bell/phone within reach ? ?OT Visit Diagnosis: Unsteadiness on feet (R26.81);Muscle weakness (gener

## 2021-10-22 NOTE — Progress Notes (Addendum)
Pt d/c this am. Contacted pt's daughter via phone to confirm pt's d/c for this morning. Unable to speak with daughter so left a message to say pt stable for d/c this am and requested a return call. Pt's daughter informed navigator yesterday that she could be here around 9:15-9:20 to pick pt up and take to clinic. ? ?Melven Sartorius ?Renal Navigator ?[phone] ? ?Addendum at 9:15 am: ?Spoke to pt's daughter via phone. Daughter is on her way to hospital to pick pt up and transport pt to out-pt HD center. Contacted [facility] and spoke to RN who is aware pt will receive treatment at clinic today. Contacted renal NP regarding clinic's need for orders for treatment. Also spoke to pt's RN who is aware of above plans.  ?

## 2021-10-22 NOTE — Discharge Instructions (Signed)
Follow with Primary MD Bartholome Bill, MD in 7 days  ? ?Get CBC, CMP, 2 view Chest X ray -  checked next visit within 1 week by Primary MD   ? ?Activity: As tolerated with Full fall precautions use walker/cane & assistance as needed ? ?Disposition Home  ? ?Diet: Renal Low Carb, 1.5 lit/day fluid restriction ? ?Special Instructions: If you have smoked or chewed Tobacco  in the last 2 yrs please stop smoking, stop any regular Alcohol  and or any Recreational drug use. ? ?On your next visit with your primary care physician please Get Medicines reviewed and adjusted. ? ?Please request your Prim.MD to go over all Hospital Tests and Procedure/Radiological results at the follow up, please get all Hospital records sent to your Prim MD by signing hospital release before you go home. ? ?If you experience worsening of your admission symptoms, develop shortness of breath, life threatening emergency, suicidal or homicidal thoughts you must seek medical attention immediately by calling 911 or calling your MD immediately  if symptoms less severe. ? ?You Must read complete instructions/literature along with all the possible adverse reactions/side effects for all the Medicines you take and that have been prescribed to you. Take any new Medicines after you have completely understood and accpet all the possible adverse reactions/side effects.  ? ?

## 2021-10-23 ENCOUNTER — Telehealth: Payer: Self-pay | Admitting: Nephrology

## 2021-10-23 LAB — METHYLMALONIC ACID, SERUM: Methylmalonic Acid, Quantitative: 791 nmol/L — ABNORMAL HIGH (ref 0–378)

## 2021-10-23 NOTE — Telephone Encounter (Signed)
Transition of care contact from inpatient facility ? ?Date of Discharge: 10/22/21 ?Date of Contact: attepted 10/23/21 ?Method of contact: Phone ? ?Attempted to contact patient to discuss transition of care from inpatient admission. Patient did not answer the phone. Message was left on the patient's voicemail with call  will see at [facility]  dialysis TTS  schedule  ? ?

## 2021-11-19 NOTE — Progress Notes (Deleted)
[facility] [address] [place]. [place] [address] [phone]       HOSPITAL FOLLOW UP NOTE  Ms. Ilaisaane Ettinger Date of Birth:  1933/01/05 Medical Record Number:  [id number]   Reason for Referral:  hospital stroke follow up    SUBJECTIVE:   CHIEF COMPLAINT:  No chief complaint on file.   HPI:   Ms. Camy Leder is a 88 y.o. female with history of ESRD on hemodialysis, poorly controlled hypertension, diabetes myelitis type II, hyperlipidemia, COPD with chronic oxygen who presented on 07/04/2021 s/p cardiac arrest with ROSC after 15 min with questionable presumed triggered pneumonia/hypoxia.  Patient was extubated 12/9.  Patient was noted to have intermittent aphasia on 12/11 and 2/12 with a stable CT head.  On the morning of 12/12 the patient's daughter was speaking with her on the phone and could not understand what she was saying.  MRI obtained which noted left MCA punctate infarcts, unclear etiology, possibly cardioembolic vs large vessel disease of left terminal ICA stenosis.  MRI also showed old cortical and subcortical infarcts in the left frontal lobe and old lacunar infarction in the right thalamus.  EF 50 to 55%.  LE Doppler negative for DVT.  Carotid ultrasound right ICA 40 to 59% stenosis.  Recommended further cardiac monitoring to rule out A-fib as potential etiology.  Recommended DAPT for 3 months then aspirin alone given left ICA severe stenosis as well as continuation of simvastatin 20 mg daily.  Per therapy eval's, patient discharged to CIR for ongoing therapy needs.   Today, 11/20/2021, patient is being seen for initial hospital follow-up.   Hospitalized 3/24 - 3/28 due to acute respiratory failure with hypoxia and hypercapnia with acute encephalopathy     PERTINENT IMAGING/LABS  Per hospitalization 07/04/2021 Code Stroke- stable left frontal encephalomalacia MRI  approximately 10 punctate acute infarctions scattered within the left MCA  territory consistent with embolic infarctions.  Old cortical and subcortical infarctions on the left frontal lobe. Old lacunar infarction in the right thalamus MRA head moderate to severe narrowing in the left supraclinoid ICA. Decreased perfusion in the left distal MCA branches. Multifocal narrowing of the right vertebral artery, which appears occluded just proximal to the vertebrobasilar junction 2D Echo LVEF 50 to 55% LE venous doppler neg CUS right ICA 40-59% stenosis  LDL 73 HgbA1c 5.7    ROS:   14 system review of systems performed and negative with exception of ***  PMH:  Past Medical History:  Diagnosis Date   Chronic kidney disease    COPD (chronic obstructive pulmonary disease) ([place])    Diabetes mellitus without complication (HCC)    Hyperlipidemia    Hypertension     PSH:  Past Surgical History:  Procedure Laterality Date   ABDOMINAL HYSTERECTOMY     AV FISTULA PLACEMENT     IR REMOVAL TUN CV CATH W/O FL  09/07/2020    Social History:  Social History   Socioeconomic History   Marital status: Married    Spouse name: Not on file   Number of children: Not on file   Years of education: Not on file   Highest education level: Not on file  Occupational History   Not on file  Tobacco Use   Smoking status: Never   Smokeless tobacco: Never  Vaping Use   Vaping Use: Never used  Substance and Sexual Activity   Alcohol use: Not Currently    Comment: occasional wine   Drug use: Never   Sexual activity:  Not Currently  Other Topics Concern   Not on file  Social History Narrative   Not on file   Social Determinants of Health   Financial Resource Strain: Not on file  Food Insecurity: Not on file  Transportation Needs: Not on file  Physical Activity: Not on file  Stress: Not on file  Social Connections: Not on file  Intimate Partner Violence: Not on file    Family History: No family history on file.  Medications:   Current Outpatient Medications on File  Prior to Visit  Medication Sig Dispense Refill   acetaminophen (TYLENOL) 500 MG tablet Take 2 tablets (1,000 mg total) by mouth 3 (three) times daily. For 3 to 5 days and then can consider changing it to as needed based on improvement of her musculoskeletal chest pain related to rib fractures. (Patient taking differently: Take 1,000 mg by mouth every 8 (eight) hours as needed for mild pain.)     albuterol (PROVENTIL) (2.5 MG/3ML) 0.083% nebulizer solution Inhale 2.5 mg into the lungs every 4 (four) hours as needed for wheezing or shortness of breath.     albuterol (VENTOLIN HFA) 108 (90 Base) MCG/ACT inhaler Inhale 2 puffs into the lungs every 4 (four) hours as needed for shortness of breath.     azithromycin (ZITHROMAX) 250 MG tablet Take 1 tablet (250 mg total) by mouth daily. 2 tablet 0   carvedilol (COREG) 25 MG tablet Take 1 tablet (25 mg total) by mouth See admin instructions. Take 25 mg twice daily on nondailysis days (Sun, Mon, Wed, and Fri) (Patient taking differently: Take 25 mg by mouth in the morning and at bedtime.) 32 tablet 0   cinacalcet (SENSIPAR) 30 MG tablet Take 1 tablet (30 mg total) by mouth Every Tuesday,Thursday,and Saturday with dialysis. 60 tablet 0   cloNIDine (CATAPRES) 0.1 MG tablet Take 1 tablet (0.1 mg total) by mouth 2 (two) times daily. 60 tablet 11   clopidogrel (PLAVIX) 75 MG tablet Take 75 mg by mouth daily.     docusate sodium (COLACE) 100 MG capsule Take 1 capsule (100 mg total) by mouth 2 (two) times daily.     doxercalciferol (HECTOROL) 4 MCG/2ML injection Inject 2.5 mLs (5 mcg total) into the vein Every Tuesday,Thursday,and Saturday with dialysis. 2 mL    ferric citrate (AURYXIA) 1 GM 210 MG(Fe) tablet Take 1 tablet (210 mg total) by mouth 3 (three) times daily with meals. 90 tablet 0   fluticasone-salmeterol (ADVAIR HFA) 115-21 MCG/ACT inhaler Inhale 1 puff into the lungs 2 (two) times daily. (Patient taking differently: Inhale 1 puff into the lungs 2 (two)  times daily as needed (shortness of breath).) 1 each 12   lidocaine (LIDODERM) 5 % Place 2 patches onto the skin daily. Remove & Discard patch within 12 hours or as directed by MD 30 patch 0   lidocaine-prilocaine (EMLA) cream Apply 1 application topically 3 (three) times a week.     Menthol-Methyl Salicylate (MUSCLE RUB) 10-15 % CREA Apply 1 application. topically daily as needed for muscle pain.     methylPREDNISolone (MEDROL DOSEPAK) 4 MG TBPK tablet Follow package directions 21 tablet 0   mirtazapine (REMERON) 15 MG tablet Take 15 mg by mouth daily.     NIFEdipine (PROCARDIA-XL/NIFEDICAL-XL) 30 MG 24 hr tablet Take 2 tablets (60 mg total) by mouth at bedtime. (Patient taking differently: Take 30 mg by mouth in the morning and at bedtime.) 60 tablet 0   omeprazole (PRILOSEC) 20 MG capsule Take 2 capsules (  40 mg total) by mouth daily. (Patient taking differently: Take 40 mg by mouth 2 (two) times daily before a meal.) 30 capsule 0   Polyethyl Glycol-Propyl Glycol (SYSTANE OP) Place 1 drop into both eyes daily as needed (dry eyes).     polyethylene glycol (MIRALAX / GLYCOLAX) 17 g packet Take 17 g by mouth daily. (Patient taking differently: Take 17 g by mouth daily as needed for mild constipation.) 14 each 0   rOPINIRole (REQUIP) 0.5 MG tablet Take 0.5 mg by mouth in the morning and at bedtime.     simvastatin (ZOCOR) 20 MG tablet Take 20 mg by mouth at bedtime.     traZODone (DESYREL) 50 MG tablet TAKE 1 TABLET(50 MG) BY MOUTH AT BEDTIME AS NEEDED FOR SLEEP (Patient taking differently: Take 50 mg by mouth at bedtime.) 30 tablet 0   No current facility-administered medications on file prior to visit.    Allergies:   Allergies  Allergen Reactions   Penicillins Rash   Prednisone Other (See Comments)    insomnia     Scallops [Shellfish Allergy]     Threw up blood Scallops only - can eat other shellfish   Tramadol Anxiety      OBJECTIVE:  Physical Exam  There were no vitals filed  for this visit. There is no height or weight on file to calculate BMI. No results found.      View : No data to display.           General: well developed, well nourished, seated, in no evident distress Head: head normocephalic and atraumatic.   Neck: supple with no carotid or supraclavicular bruits Cardiovascular: regular rate and rhythm, no murmurs Musculoskeletal: no deformity Skin:  no rash/petichiae Vascular:  Normal pulses all extremities   Neurologic Exam Mental Status: Awake and fully alert. Oriented to place and time. Recent and remote memory intact. Attention span, concentration and fund of knowledge appropriate. Mood and affect appropriate.  Cranial Nerves: Fundoscopic exam reveals sharp disc margins. Pupils equal, briskly reactive to light. Extraocular movements full without nystagmus. Visual fields full to confrontation. Hearing intact. Facial sensation intact. Face, tongue, palate moves normally and symmetrically.  Motor: Normal bulk and tone. Normal strength in all tested extremity muscles Sensory.: intact to touch , pinprick , position and vibratory sensation.  Coordination: Rapid alternating movements normal in all extremities. Finger-to-nose and heel-to-shin performed accurately bilaterally. Gait and Station: Arises from chair without difficulty. Stance is normal. Gait demonstrates normal stride length and balance with ***. Tandem walk and heel toe ***.  Reflexes: 1+ and symmetric. Toes downgoing.     NIHSS  *** Modified [place]in  ***      ASSESSMENT: Brittanie Cocco is a 88 y.o. year old female with hospitalization on 07/04/2021 after cardiac arrest and once extubated on 12/11, noted to be aphasic with MRI showing left MCA scattered several punctate infarcts, secondary to unclear source, possibly cardioembolic vs large vessel disease of left terminal ICA stenosis. Vascular risk factors include HTN, HLD, DM, advanced age, prior strokes on imaging (left frontal lobe  and right thalamic infarcts), and bilateral carotid stenosis.      PLAN:  Left MCA stroke:  Residual deficit: ***.  Continue {anticoagulants:31417}  and ***  for secondary stroke prevention.   Discussed secondary stroke prevention measures and importance of close PCP follow up for aggressive stroke risk factor management including BP goal<130/90, HLD with LDL goal<70 and DM with A1c.<7 .  I have gone over the pathophysiology  of stroke, warning signs and symptoms, risk factors and their management in some detail with instructions to go to the closest emergency room for symptoms of concern.     Follow up in *** or call earlier if needed   CC:  [facility] provider: Dr. Leonie Man PCP: Bartholome Bill, MD    I spent *** minutes of face-to-face and non-face-to-face time with patient.  This included previsit chart review including review of recent hospitalization, lab review, study review, order entry, electronic health record documentation, patient education regarding recent stroke including etiology, secondary stroke prevention measures and importance of managing stroke risk factors, residual deficits and typical recovery time and answered all other questions to patient satisfaction   Frann Rider, AGNP-BC  [facility] [address] [place], [place] [address]  Phone [phone] Fax [phone] Note: This document was prepared with digital dictation and possible smart phrase technology. Any transcriptional errors that result from this process are unintentional.

## 2021-11-20 ENCOUNTER — Inpatient Hospital Stay: Payer: Medicare Other | Admitting: Adult Health

## 2022-03-22 ENCOUNTER — Emergency Department (HOSPITAL_COMMUNITY): Payer: Medicare Other

## 2022-03-22 ENCOUNTER — Other Ambulatory Visit: Payer: Self-pay

## 2022-03-22 ENCOUNTER — Inpatient Hospital Stay (HOSPITAL_COMMUNITY)
Admission: EM | Admit: 2022-03-22 | Discharge: 2022-03-24 | DRG: 189 | Disposition: A | Discharge status: Home / Self Care | Payer: Medicare Other | Attending: Internal Medicine | Admitting: Internal Medicine

## 2022-03-22 ENCOUNTER — Encounter (HOSPITAL_COMMUNITY): Payer: Self-pay

## 2022-03-22 DIAGNOSIS — I5032 Chronic diastolic (congestive) heart failure: Secondary | ICD-10-CM | POA: Diagnosis present

## 2022-03-22 DIAGNOSIS — Z992 Dependence on renal dialysis: Secondary | ICD-10-CM | POA: Diagnosis not present

## 2022-03-22 DIAGNOSIS — I350 Nonrheumatic aortic (valve) stenosis: Secondary | ICD-10-CM | POA: Diagnosis present

## 2022-03-22 DIAGNOSIS — J9601 Acute respiratory failure with hypoxia: Principal | ICD-10-CM | POA: Diagnosis present

## 2022-03-22 DIAGNOSIS — I63412 Cerebral infarction due to embolism of left middle cerebral artery: Secondary | ICD-10-CM

## 2022-03-22 DIAGNOSIS — E785 Hyperlipidemia, unspecified: Secondary | ICD-10-CM | POA: Diagnosis present

## 2022-03-22 DIAGNOSIS — I272 Pulmonary hypertension, unspecified: Secondary | ICD-10-CM | POA: Diagnosis present

## 2022-03-22 DIAGNOSIS — R0603 Acute respiratory distress: Principal | ICD-10-CM

## 2022-03-22 DIAGNOSIS — J9602 Acute respiratory failure with hypercapnia: Secondary | ICD-10-CM | POA: Diagnosis present

## 2022-03-22 DIAGNOSIS — E119 Type 2 diabetes mellitus without complications: Secondary | ICD-10-CM | POA: Diagnosis not present

## 2022-03-22 DIAGNOSIS — F32A Depression, unspecified: Secondary | ICD-10-CM | POA: Diagnosis present

## 2022-03-22 DIAGNOSIS — Z880 Allergy status to penicillin: Secondary | ICD-10-CM | POA: Diagnosis not present

## 2022-03-22 DIAGNOSIS — Z91013 Allergy to seafood: Secondary | ICD-10-CM

## 2022-03-22 DIAGNOSIS — E8729 Other acidosis: Secondary | ICD-10-CM | POA: Diagnosis present

## 2022-03-22 DIAGNOSIS — N186 End stage renal disease: Secondary | ICD-10-CM | POA: Diagnosis present

## 2022-03-22 DIAGNOSIS — N2581 Secondary hyperparathyroidism of renal origin: Secondary | ICD-10-CM | POA: Diagnosis present

## 2022-03-22 DIAGNOSIS — Z20822 Contact with and (suspected) exposure to covid-19: Secondary | ICD-10-CM | POA: Diagnosis present

## 2022-03-22 DIAGNOSIS — J441 Chronic obstructive pulmonary disease with (acute) exacerbation: Secondary | ICD-10-CM | POA: Diagnosis present

## 2022-03-22 DIAGNOSIS — E1122 Type 2 diabetes mellitus with diabetic chronic kidney disease: Secondary | ICD-10-CM | POA: Diagnosis present

## 2022-03-22 DIAGNOSIS — Z888 Allergy status to other drugs, medicaments and biological substances status: Secondary | ICD-10-CM

## 2022-03-22 DIAGNOSIS — Z7951 Long term (current) use of inhaled steroids: Secondary | ICD-10-CM

## 2022-03-22 DIAGNOSIS — H8190 Unspecified disorder of vestibular function, unspecified ear: Secondary | ICD-10-CM

## 2022-03-22 DIAGNOSIS — Z8673 Personal history of transient ischemic attack (TIA), and cerebral infarction without residual deficits: Secondary | ICD-10-CM

## 2022-03-22 DIAGNOSIS — J440 Chronic obstructive pulmonary disease with acute lower respiratory infection: Secondary | ICD-10-CM | POA: Diagnosis present

## 2022-03-22 DIAGNOSIS — I5030 Unspecified diastolic (congestive) heart failure: Secondary | ICD-10-CM | POA: Diagnosis not present

## 2022-03-22 DIAGNOSIS — G2581 Restless legs syndrome: Secondary | ICD-10-CM | POA: Diagnosis present

## 2022-03-22 DIAGNOSIS — Z79899 Other long term (current) drug therapy: Secondary | ICD-10-CM

## 2022-03-22 DIAGNOSIS — I132 Hypertensive heart and chronic kidney disease with heart failure and with stage 5 chronic kidney disease, or end stage renal disease: Secondary | ICD-10-CM | POA: Diagnosis present

## 2022-03-22 DIAGNOSIS — D631 Anemia in chronic kidney disease: Secondary | ICD-10-CM | POA: Diagnosis present

## 2022-03-22 DIAGNOSIS — Z7902 Long term (current) use of antithrombotics/antiplatelets: Secondary | ICD-10-CM

## 2022-03-22 DIAGNOSIS — I248 Other forms of acute ischemic heart disease: Secondary | ICD-10-CM | POA: Diagnosis present

## 2022-03-22 DIAGNOSIS — Z833 Family history of diabetes mellitus: Secondary | ICD-10-CM

## 2022-03-22 DIAGNOSIS — J189 Pneumonia, unspecified organism: Secondary | ICD-10-CM | POA: Diagnosis present

## 2022-03-22 DIAGNOSIS — Z8249 Family history of ischemic heart disease and other diseases of the circulatory system: Secondary | ICD-10-CM

## 2022-03-22 DIAGNOSIS — J449 Chronic obstructive pulmonary disease, unspecified: Secondary | ICD-10-CM | POA: Diagnosis not present

## 2022-03-22 LAB — RESPIRATORY PANEL BY PCR

## 2022-03-22 LAB — COMPREHENSIVE METABOLIC PANEL
ALT: 22 U/L (ref 0–44)
AST: 26 U/L (ref 15–41)
Albumin: 3.9 g/dL (ref 3.5–5.0)
Alkaline Phosphatase: 86 U/L (ref 38–126)
Anion gap: 19 — ABNORMAL HIGH (ref 5–15)
BUN: 70 mg/dL — ABNORMAL HIGH (ref 8–23)
CO2: 16 mmol/L — ABNORMAL LOW (ref 22–32)
Calcium: 9.2 mg/dL (ref 8.9–10.3)
Chloride: 104 mmol/L (ref 98–111)
Creatinine, Ser: 10.15 mg/dL — ABNORMAL HIGH (ref 0.44–1.00)
GFR, Estimated: 3 mL/min — ABNORMAL LOW (ref >60)
Glucose, Bld: 246 mg/dL — ABNORMAL HIGH (ref 70–99)
Potassium: 4.5 mmol/L (ref 3.5–5.1)
Sodium: 139 mmol/L (ref 135–145)
Total Bilirubin: 0.3 mg/dL (ref 0.3–1.2)
Total Protein: 6.8 g/dL (ref 6.5–8.1)

## 2022-03-22 LAB — I-STAT ARTERIAL BLOOD GAS, ED
Acid-base deficit: 1 mmol/L (ref 0.0–2.0)
Acid-base deficit: 3 mmol/L — ABNORMAL HIGH (ref 0.0–2.0)
Bicarbonate: 27.8 mmol/L (ref 20.0–28.0)
Bicarbonate: 24.8 mmol/L (ref 20.0–28.0)
Calcium, Ion: 1.22 mmol/L (ref 1.15–1.40)
Calcium, Ion: 1.22 mmol/L (ref 1.15–1.40)
HCT: 30 % — ABNORMAL LOW (ref 36.0–46.0)
HCT: 31 % — ABNORMAL LOW (ref 36.0–46.0)
Hemoglobin: 10.2 g/dL — ABNORMAL LOW (ref 12.0–15.0)
Hemoglobin: 10.5 g/dL — ABNORMAL LOW (ref 12.0–15.0)
O2 Saturation: 100 %
O2 Saturation: 89 %
Potassium: 4.3 mmol/L (ref 3.5–5.1)
Potassium: 5.1 mmol/L (ref 3.5–5.1)
Sodium: 137 mmol/L (ref 135–145)
Sodium: 138 mmol/L (ref 135–145)
TCO2: 30 mmol/L (ref 22–32)
TCO2: 27 mmol/L (ref 22–32)
pCO2 arterial: 65 mmHg — ABNORMAL HIGH (ref 32–48)
pCO2 arterial: 59.3 mmHg — ABNORMAL HIGH (ref 32–48)
pH, Arterial: 7.239 — ABNORMAL LOW (ref 7.35–7.45)
pH, Arterial: 7.23 — ABNORMAL LOW (ref 7.35–7.45)
pO2, Arterial: 499 mmHg — ABNORMAL HIGH (ref 83–108)
pO2, Arterial: 68 mmHg — ABNORMAL LOW (ref 83–108)

## 2022-03-22 LAB — RESP PANEL BY RT-PCR (FLU A&B, COVID) ARPGX2
Influenza A by PCR: NEGATIVE
Influenza B by PCR: NEGATIVE
SARS Coronavirus 2 by RT PCR: NEGATIVE

## 2022-03-22 LAB — CBC WITH DIFFERENTIAL/PLATELET
Abs Immature Granulocytes: 0.11 10*3/uL — ABNORMAL HIGH (ref 0.00–0.07)
Basophils Absolute: 0.1 10*3/uL (ref 0.0–0.1)
Basophils Relative: 0 %
Eosinophils Absolute: 0.1 10*3/uL (ref 0.0–0.5)
Eosinophils Relative: 1 %
HCT: 32.9 % — ABNORMAL LOW (ref 36.0–46.0)
Hemoglobin: 10.2 g/dL — ABNORMAL LOW (ref 12.0–15.0)
Immature Granulocytes: 1 %
Lymphocytes Relative: 16 %
Lymphs Abs: 2.3 10*3/uL (ref 0.7–4.0)
MCH: 29.3 pg (ref 26.0–34.0)
MCHC: 31 g/dL (ref 30.0–36.0)
MCV: 94.5 fL (ref 80.0–100.0)
Monocytes Absolute: 1.2 10*3/uL — ABNORMAL HIGH (ref 0.1–1.0)
Monocytes Relative: 9 %
Neutro Abs: 10.5 10*3/uL — ABNORMAL HIGH (ref 1.7–7.7)
Neutrophils Relative %: 73 %
Platelets: 200 10*3/uL (ref 150–400)
RBC: 3.48 MIL/uL — ABNORMAL LOW (ref 3.87–5.11)
RDW: 16.4 % — ABNORMAL HIGH (ref 11.5–15.5)
WBC: 14.3 10*3/uL — ABNORMAL HIGH (ref 4.0–10.5)
nRBC: 0.1 % (ref 0.0–0.2)

## 2022-03-22 LAB — HEPATITIS B CORE ANTIBODY, TOTAL: Hep B Core Total Ab: NONREACTIVE

## 2022-03-22 LAB — I-STAT CHEM 8, ED
BUN: 71 mg/dL — ABNORMAL HIGH (ref 8–23)
Calcium, Ion: 1.21 mmol/L (ref 1.15–1.40)
Chloride: 104 mmol/L (ref 98–111)
Creatinine, Ser: 10.3 mg/dL — ABNORMAL HIGH (ref 0.44–1.00)
Glucose, Bld: 247 mg/dL — ABNORMAL HIGH (ref 70–99)
HCT: 36 % (ref 36.0–46.0)
Hemoglobin: 12.2 g/dL (ref 12.0–15.0)
Potassium: 4.4 mmol/L (ref 3.5–5.1)
Sodium: 141 mmol/L (ref 135–145)
TCO2: 27 mmol/L (ref 22–32)

## 2022-03-22 LAB — LACTIC ACID, PLASMA
Lactic Acid, Venous: 2.2 mmol/L (ref 0.5–1.9)
Lactic Acid, Venous: 1.1 mmol/L (ref 0.5–1.9)

## 2022-03-22 LAB — HEPATITIS C ANTIBODY: HCV Ab: NONREACTIVE

## 2022-03-22 LAB — TROPONIN I (HIGH SENSITIVITY)
Troponin I (High Sensitivity): 45 ng/L — ABNORMAL HIGH (ref <18)
Troponin I (High Sensitivity): 51 ng/L — ABNORMAL HIGH (ref <18)
Troponin I (High Sensitivity): 64 ng/L — ABNORMAL HIGH (ref <18)

## 2022-03-22 LAB — PROTIME-INR
INR: 1 (ref 0.8–1.2)
Prothrombin Time: 13.1 seconds (ref 11.4–15.2)

## 2022-03-22 LAB — HEPATITIS B SURFACE ANTIGEN: Hepatitis B Surface Ag: NONREACTIVE

## 2022-03-22 LAB — PROCALCITONIN: Procalcitonin: 1.64 ng/mL

## 2022-03-22 LAB — CBG MONITORING, ED
Glucose-Capillary: 114 mg/dL — ABNORMAL HIGH (ref 70–99)
Glucose-Capillary: 300 mg/dL — ABNORMAL HIGH (ref 70–99)

## 2022-03-22 LAB — HEPATITIS B SURFACE ANTIBODY,QUALITATIVE: Hep B S Ab: REACTIVE — AB

## 2022-03-22 LAB — MRSA NEXT GEN BY PCR, NASAL: MRSA by PCR Next Gen: NOT DETECTED

## 2022-03-22 LAB — BRAIN NATRIURETIC PEPTIDE: B Natriuretic Peptide: >4500 pg/mL — ABNORMAL HIGH (ref 0.0–100.0)

## 2022-03-22 LAB — PHOSPHORUS: Phosphorus: 7.4 mg/dL — ABNORMAL HIGH (ref 2.5–4.6)

## 2022-03-22 MED ORDER — CHLORHEXIDINE GLUCONATE CLOTH 2 % EX PADS
6.0000 | MEDICATED_PAD | Freq: Every day | CUTANEOUS | Status: DC
  Administered 2022-03-22 – 2022-03-24 (×3): 6 via TOPICAL

## 2022-03-22 MED ORDER — SODIUM CHLORIDE 0.9 % IV SOLN
2.0000 g | INTRAVENOUS | Status: DC
  Administered 2022-03-22 – 2022-03-23 (×2): 2 g via INTRAVENOUS
  Filled 2022-03-22 (×2): qty 20

## 2022-03-22 MED ORDER — HEPARIN SODIUM (PORCINE) 1000 UNIT/ML IJ SOLN
INTRAMUSCULAR | Status: AC
  Administered 2022-03-22: 2000 [IU]
  Filled 2022-03-22: qty 2

## 2022-03-22 MED ORDER — CEFEPIME HCL 2 G IV SOLR
2.0000 g | Freq: Once | INTRAVENOUS | Status: AC
  Administered 2022-03-22: 2 g via INTRAVENOUS
  Filled 2022-03-22: qty 12.5

## 2022-03-22 MED ORDER — SODIUM CHLORIDE 0.9 % IV SOLN
500.0000 mg | INTRAVENOUS | Status: AC
  Administered 2022-03-22 – 2022-03-24 (×3): 500 mg via INTRAVENOUS
  Filled 2022-03-22 (×4): qty 5

## 2022-03-22 MED ORDER — PANTOPRAZOLE SODIUM 40 MG PO TBEC
40.0000 mg | DELAYED_RELEASE_TABLET | Freq: Every day | ORAL | Status: DC
  Administered 2022-03-23 – 2022-03-24 (×2): 40 mg via ORAL
  Filled 2022-03-22 (×2): qty 1

## 2022-03-22 MED ORDER — DOXERCALCIFEROL 4 MCG/2ML IV SOLN
5.0000 ug | INTRAVENOUS | Status: DC
  Administered 2022-03-22: 5 ug via INTRAVENOUS
  Filled 2022-03-22: qty 4

## 2022-03-22 MED ORDER — VANCOMYCIN HCL 1250 MG/250ML IV SOLN
1250.0000 mg | Freq: Once | INTRAVENOUS | Status: AC
  Administered 2022-03-22: 1250 mg via INTRAVENOUS
  Filled 2022-03-22: qty 250

## 2022-03-22 MED ORDER — CINACALCET HCL 30 MG PO TABS: 30.0000 mg | ORAL_TABLET | ORAL | Status: DC

## 2022-03-22 MED ORDER — NIFEDIPINE ER OSMOTIC RELEASE 60 MG PO TB24
60.0000 mg | ORAL_TABLET | Freq: Every day | ORAL | Status: DC
  Administered 2022-03-22 – 2022-03-23 (×2): 60 mg via ORAL
  Filled 2022-03-22 (×3): qty 1

## 2022-03-22 MED ORDER — MOMETASONE FURO-FORMOTEROL FUM 200-5 MCG/ACT IN AERO
2.0000 | INHALATION_SPRAY | Freq: Two times a day (BID) | RESPIRATORY_TRACT | Status: DC
  Administered 2022-03-23 – 2022-03-24 (×2): 2 via RESPIRATORY_TRACT
  Filled 2022-03-22 (×2): qty 8.8

## 2022-03-22 MED ORDER — ACETAMINOPHEN 325 MG PO TABS
650.0000 mg | ORAL_TABLET | Freq: Four times a day (QID) | ORAL | Status: DC | PRN
  Administered 2022-03-23: 650 mg via ORAL
  Filled 2022-03-22: qty 2

## 2022-03-22 MED ORDER — CLONIDINE HCL 0.1 MG PO TABS
0.1000 mg | ORAL_TABLET | Freq: Two times a day (BID) | ORAL | Status: DC
  Administered 2022-03-22 – 2022-03-24 (×4): 0.1 mg via ORAL
  Filled 2022-03-22 (×4): qty 1

## 2022-03-22 MED ORDER — IPRATROPIUM-ALBUTEROL 0.5-2.5 (3) MG/3ML IN SOLN
3.0000 mL | RESPIRATORY_TRACT | Status: DC
Start: 2022-03-22 — End: 2022-03-23
  Administered 2022-03-22: 3 mL via RESPIRATORY_TRACT
  Filled 2022-03-22 (×2): qty 3

## 2022-03-22 MED ORDER — MIRTAZAPINE 15 MG PO TABS
15.0000 mg | ORAL_TABLET | Freq: Every day | ORAL | Status: DC
  Administered 2022-03-22 – 2022-03-23 (×2): 15 mg via ORAL
  Filled 2022-03-22 (×3): qty 1

## 2022-03-22 MED ORDER — HEPARIN SODIUM (PORCINE) 5000 UNIT/ML IJ SOLN
5000.0000 [IU] | Freq: Three times a day (TID) | INTRAMUSCULAR | Status: DC
  Administered 2022-03-22 – 2022-03-24 (×5): 5000 [IU] via SUBCUTANEOUS
  Filled 2022-03-22 (×5): qty 1

## 2022-03-22 MED ORDER — TRAZODONE HCL 50 MG PO TABS
50.0000 mg | ORAL_TABLET | Freq: Every day | ORAL | Status: DC
  Administered 2022-03-22 – 2022-03-23 (×2): 50 mg via ORAL
  Filled 2022-03-22 (×2): qty 1

## 2022-03-22 MED ORDER — CARVEDILOL 12.5 MG PO TABS
12.5000 mg | ORAL_TABLET | Freq: Two times a day (BID) | ORAL | Status: DC
  Administered 2022-03-23 – 2022-03-24 (×3): 12.5 mg via ORAL
  Filled 2022-03-22 (×3): qty 1

## 2022-03-22 MED ORDER — PREDNISONE 20 MG PO TABS
40.0000 mg | ORAL_TABLET | Freq: Every day | ORAL | Status: DC
  Administered 2022-03-23 – 2022-03-24 (×2): 40 mg via ORAL
  Filled 2022-03-22 (×2): qty 2

## 2022-03-22 MED ORDER — ACETAMINOPHEN 650 MG RE SUPP: 650.0000 mg | Freq: Four times a day (QID) | RECTAL | Status: DC | PRN

## 2022-03-22 MED ORDER — VANCOMYCIN VARIABLE DOSE PER UNSTABLE RENAL FUNCTION (PHARMACIST DOSING): Status: DC

## 2022-03-22 MED ORDER — SIMVASTATIN 20 MG PO TABS
20.0000 mg | ORAL_TABLET | Freq: Every day | ORAL | Status: DC
  Administered 2022-03-22 – 2022-03-23 (×2): 20 mg via ORAL
  Filled 2022-03-22 (×2): qty 1

## 2022-03-22 MED ORDER — SODIUM CHLORIDE 0.9 % IV SOLN: 1.0000 g | INTRAVENOUS | Status: DC

## 2022-03-22 MED ORDER — ROPINIROLE HCL 1 MG PO TABS
0.5000 mg | ORAL_TABLET | Freq: Every day | ORAL | Status: DC
  Administered 2022-03-22: 0.5 mg via ORAL
  Filled 2022-03-22 (×3): qty 1

## 2022-03-22 NOTE — Procedures (Signed)
   I was present at this dialysis session, have reviewed the session itself and made  appropriate changes Kelly Splinter MD Jette pager [phone]   03/22/2022, 4:30 PM

## 2022-03-22 NOTE — Progress Notes (Signed)
Pt taken off EMS CPAP and placed on BiPAP by RT. Pt tolerating  well at this time, RN at bedside, spo2 100%, RT will monitor.

## 2022-03-22 NOTE — ED Notes (Addendum)
When pt arrived she was unresponsive, EMS had her on CPAP, once on the stretcher in Tra [facility] she was placed on BIPAP after removing her teeth. She was sating 79% initially & she was being prepped or intubation. She then began to respond to painful stimuli & eventually verbal stimuli. She now can answer all questions, A/Ox3 - confused to situation & is sating 100% while remaining on BIPAP.

## 2022-03-22 NOTE — Progress Notes (Signed)
Pharmacy Antibiotic Note  Stacey Dorsey is a 88 y.o. female admitted on 03/22/2022 with respiratory distress.  Pharmacy has been consulted for vancomycin and cefepime dosing. Patient with PCN allergy (rash) but received ceftriaxone in December 2022. Patient is ESRD on HD TTS and reported missing last dialysis session.   Plan: Vancomycin 1250mg  x1 loading dose  Follow up iHD plans to schedule maintenance dose of vancomycin with iHD  Cefepime 2g x1 followed by 1g q24h  Obtain vancomycin levels as appropriate Follow up cultures and MRSA PCR     No data recorded.  No results for input(s): "WBC", "CREATININE", "LATICACIDVEN", "VANCOTROUGH", "VANCOPEAK", "VANCORANDOM", "GENTTROUGH", "GENTPEAK", "GENTRANDOM", "TOBRATROUGH", "TOBRAPEAK", "TOBRARND", "AMIKACINPEAK", "AMIKACINTROU", "AMIKACIN" in the last 168 hours.  CrCl cannot be calculated (Patient's most recent lab result is older than the maximum 21 days allowed.).    Allergies  Allergen Reactions   Penicillins Rash   Prednisone Other (See Comments)    insomnia     Scallops [Shellfish Allergy]     Threw up blood Scallops only - can eat other shellfish   Tramadol Anxiety    Antimicrobials this admission: Vancomycin 8/26 >>  Cefepime 8/26 >>   Dose adjustments this admission:   Microbiology results: 8/26 bcx:   Thank you for allowing pharmacy to be a part of this patient's care.  Cristela Felt, PharmD, BCPS Clinical Pharmacist 03/22/2022 8:10 AM

## 2022-03-22 NOTE — Hospital Course (Addendum)
  Copd  Never smoker Not on home o2   Does not use advair Walker and cand cane but does not use  [place] drives her but othewise  Hypertension  Moderate alcohol intake 1 drink a month, never smoked   Family history   2 year, great  Full code -------------- 8/28: Patient states that she was feeling well yesterday but that she is experiencing generalized weakness today. Patient states that she experiences dizziness upon standing. Patient states that she is still coughing up some mucus. Patient states that she has been working well with PT. Discussed plan to assess orthostatic vital signs. Discussed plan to likely discharge tomorrow. Discussed plan to send her home with steroids for her COPD.

## 2022-03-22 NOTE — Progress Notes (Signed)
Pt taken off BiPAP an placed on 3L [place] by RT. Pt tolerating well at this time, no resp distress noted, SpO2 100%, pt able to answer questions appropriately , RN aware, MD notified RT will monitor.

## 2022-03-22 NOTE — ED Notes (Signed)
Report given to [place], HD RN

## 2022-03-22 NOTE — ED Triage Notes (Signed)
Pt BIB [facility] from home as acute onset Resp Distress. Fire arrived on scene & applied NRB & she was sating at 96% with BP of 190/80, & pulse was 80.EMS arrived & gave her 5mg  albuterol & 125 Solu-Medrol her lung sounds were very diminished. She then started desating below 50% so they put her on CPAP. They was able to get her up to 88% on the CPAP, she was responding by looking around & moving her arms. She then dropped to 80% on CPAP & upon arrival she was not responding to verbal or painful stimuli. She did miss her last dialysis, fistula in her Lt arm & 20g PIV in her Rt FA. She is reported to have been coded & CPR performed 6 months ago.

## 2022-03-22 NOTE — Consult Note (Signed)
Renal Service Consult Note [facility]  [place] 03/22/2022 [name], MD Requesting Physician: Dr. Lenise Herald  Reason for Consult: ESRD pt w/ resp distress HPI: The patient is a 88 y.o. year-old w/ hx of COPD, HTN, HL who presented to ED this am w/ SOB/ resp distress. EMS gave pt albuterol and IV solumedrol w/ her hx of COPD. Then required CPAP and her mental status improved. She missed her last HD on Thursday. In ED was placed on Bipap. ABG 7.23/ 65, Na 139, BUN 71, creat 10, K 4.4.  We are asked to see for dialysis.   Pt seen in ED.  Pt is drowsy but arouses easily and denies any CP or abd pain. Unable to converse much w/ the bipap in place. No sob at this time.   ROS - denies CP, no joint pain, no HA, no blurry vision, no rash, no diarrhea, no nausea/ vomiting   Past Medical History  Past Medical History:  Diagnosis Date   Chronic kidney disease    COPD (chronic obstructive pulmonary disease) ([place])    Diabetes mellitus without complication ([place])    Hyperlipidemia    Hypertension    Past Surgical History  Past Surgical History:  Procedure Laterality Date   ABDOMINAL HYSTERECTOMY     AV FISTULA PLACEMENT     IR REMOVAL TUN CV CATH W/O FL  09/07/2020   Family History History reviewed. No pertinent family history. Social History  reports that she has never smoked. She has never used smokeless tobacco. She reports that she does not currently use alcohol. She reports that she does not use drugs. Allergies  Allergies  Allergen Reactions   Penicillins Rash   Prednisone Other (See Comments)    insomnia     Scallops [Shellfish Allergy]     Threw up blood Scallops only - can eat other shellfish   Tramadol Anxiety   Home medications Prior to Admission medications   Medication Sig Start Date End Date Taking? Authorizing Provider  acetaminophen (TYLENOL) 500 MG tablet Take 2 tablets (1,000 mg total) by mouth 3 (three) times daily. For 3 to 5 days and  then can consider changing it to as needed based on improvement of her musculoskeletal chest pain related to rib fractures. Patient taking differently: Take 1,000 mg by mouth every 8 (eight) hours as needed for mild pain. 07/16/21   Modena Jansky, MD  albuterol (PROVENTIL) (2.5 MG/3ML) 0.083% nebulizer solution Inhale 2.5 mg into the lungs every 4 (four) hours as needed for wheezing or shortness of breath. 02/16/20   [provider]  albuterol (VENTOLIN HFA) 108 (90 Base) MCG/ACT inhaler Inhale 2 puffs into the lungs every 4 (four) hours as needed for shortness of breath. 04/02/20   [provider]  azithromycin (ZITHROMAX) 250 MG tablet Take 1 tablet (250 mg total) by mouth daily. 10/22/21   Thurnell Lose, MD  carvedilol (COREG) 25 MG tablet Take 1 tablet (25 mg total) by mouth See admin instructions. Take 25 mg twice daily on nondailysis days (Sun, Mon, Wed, and Fri) Patient taking differently: Take 25 mg by mouth in the morning and at bedtime. 07/26/21   Setzer, Edman Circle, PA-C  cinacalcet (SENSIPAR) 30 MG tablet Take 1 tablet (30 mg total) by mouth Every Tuesday,Thursday,and Saturday with dialysis. 07/27/21   Setzer, Edman Circle, PA-C  cloNIDine (CATAPRES) 0.1 MG tablet Take 1 tablet (0.1 mg total) by mouth 2 (two) times daily. 07/26/21   Risa Grill  J, PA-C  clopidogrel (PLAVIX) 75 MG tablet Take 75 mg by mouth daily.    Setzer, Edman Circle, PA-C  docusate sodium (COLACE) 100 MG capsule Take 1 capsule (100 mg total) by mouth 2 (two) times daily. 07/16/21   Hongalgi, Lenis Dickinson, MD  doxercalciferol (HECTOROL) 4 MCG/2ML injection Inject 2.5 mLs (5 mcg total) into the vein Every Tuesday,Thursday,and Saturday with dialysis. 07/27/21   Setzer, Edman Circle, PA-C  ferric citrate (AURYXIA) 1 GM 210 MG(Fe) tablet Take 1 tablet (210 mg total) by mouth 3 (three) times daily with meals. 07/26/21   Setzer, Edman Circle, PA-C  fluticasone-salmeterol (ADVAIR HFA) 106-26 MCG/ACT inhaler Inhale 1 puff into  the lungs 2 (two) times daily. Patient taking differently: Inhale 1 puff into the lungs 2 (two) times daily as needed (shortness of breath). 07/26/21   Setzer, Edman Circle, PA-C  lidocaine (LIDODERM) 5 % Place 2 patches onto the skin daily. Remove & Discard patch within 12 hours or as directed by MD 07/26/21   Vaughan Basta, Edman Circle, PA-C  lidocaine-prilocaine (EMLA) cream Apply 1 application topically 3 (three) times a week. 04/05/21   [provider]  Menthol-Methyl Salicylate (MUSCLE RUB) 10-15 % CREA Apply 1 application. topically daily as needed for muscle pain.    [provider]  methylPREDNISolone (MEDROL DOSEPAK) 4 MG TBPK tablet Follow package directions 10/22/21   Thurnell Lose, MD  mirtazapine (REMERON) 15 MG tablet Take 15 mg by mouth daily. 10/04/21   [provider]  NIFEdipine (PROCARDIA-XL/NIFEDICAL-XL) 30 MG 24 hr tablet Take 2 tablets (60 mg total) by mouth at bedtime. Patient taking differently: Take 30 mg by mouth in the morning and at bedtime. 07/26/21   Setzer, Edman Circle, PA-C  omeprazole (PRILOSEC) 20 MG capsule Take 2 capsules (40 mg total) by mouth daily. Patient taking differently: Take 40 mg by mouth 2 (two) times daily before a meal. 07/26/21   Setzer, Edman Circle, PA-C  Polyethyl Glycol-Propyl Glycol (SYSTANE OP) Place 1 drop into both eyes daily as needed (dry eyes).    [provider]  polyethylene glycol (MIRALAX / GLYCOLAX) 17 g packet Take 17 g by mouth daily. Patient taking differently: Take 17 g by mouth daily as needed for mild constipation. 07/26/21   Setzer, Edman Circle, PA-C  rOPINIRole (REQUIP) 0.5 MG tablet Take 0.5 mg by mouth in the morning and at bedtime. 05/03/21   [provider]  simvastatin (ZOCOR) 20 MG tablet Take 20 mg by mouth at bedtime. 05/03/21   [provider]  traZODone (DESYREL) 50 MG tablet TAKE 1 TABLET(50 MG) BY MOUTH AT BEDTIME AS NEEDED FOR SLEEP Patient taking differently: Take 50 mg by mouth at  bedtime. 09/02/21   Bayard Hugger, NP     Vitals:   03/22/22 0900 03/22/22 0915 03/22/22 0930 03/22/22 0945  BP: (!) 184/61 (!) 179/80 (!) 163/53 (!) 162/49  Pulse: 64  62 65  Resp: 14 20 19  (!) 25  SpO2: 100%  100% 100%   Exam Gen alert, on bipap, resp stable No rash, cyanosis or gangrene Sclera anicteric, throat clear  No jvd or bruits Chest clear bilat to bases, no rales/ wheezing RRR no RG Abd soft ntnd no mass or ascites +bs GU deferred MS no joint effusions or deformity Ext no LE or UE edema, no wounds or ulcers Neuro is alert, Ox 3 , nf    LUA AVG+bruit   Home meds include - albuterol, carvedilol 25 bid, cinacalcet 30 tiw tts,  clonidine 0.1 bid, clopidgorel, colace, ferric citrate 210 tid ac, fluticasone-salmeterol, mirtazapine, nifedipine 30xl bid, omeprazole, ropinirole, simvastatin, trazodone, prns/ vits/ supps    OP HD: AF TTS 3.5h  400/500   60.4kg  2/2 bath  P2  LUA AVG   Hep 2000 - mircera 75 q2, last  - doxercalciferol 5 mcg iv tiw - cinacalcet 30mg  po tiw - last HD 822, came off at dry wt   CXR 8/26 - looks like R > L pulm edema , vs R sided pna.     Na 139 K 4.5  BUN 70  Creat 10.1  CO2 16  BNP > 4500  trop 45   WBC 14K   Assessment/ Plan: Acute/ chronic hypoxic/ hypercarbic resp failure - suspected pulm edema contributing (by CXR), +/- pneumonia , +/- underlying COPD. Pt on bipap in ED. BP's are up a bit. Will plan HD early on 2nd shift and see if resp status improves w/ vol removal.  ESRD - on HD TTS. HD today as above.  H/o COPD - was here in March w/ acute hypercapneic resp failure rx'd w/ solumedrol, IV Mg, atrovent and bipap Anemia esrd - Hb 10-12, no esa needs MBD ckd - cont sensipar and IV vdra w/ HD. CCa in range, add on phos.       Rob Ayano Douthitt  MD 03/22/2022, 10:15 AM Recent Labs  Lab 03/22/22 0756 03/22/22 0756 03/22/22 0758 03/22/22 0814 03/22/22 0845  HGB  --    < > 12.2 10.2* 10.2*  ALBUMIN 3.9  --   --   --   --   CALCIUM 9.2   --   --   --   --   CREATININE 10.15*  --  10.30*  --   --   K 4.5  --  4.4 4.3  --    < > = values in this interval not displayed.

## 2022-03-22 NOTE — Progress Notes (Signed)
Pt transported on BiPAP from [facility] to 009  without any complications. RN at bedside,RT will monitor.

## 2022-03-22 NOTE — ED Provider Notes (Signed)
[place] EMERGENCY DEPARTMENT Provider Note   CSN: [id number] Arrival date & time:        History  Chief Complaint  Patient presents with   Respiratory Distress    Stacey Dorsey is a 88 y.o. female.  88 year old female with prior medical history as detailed below presents for evaluation.  Patient arrives by EMS from home.  Per EMS patient missed dialysis on Thursday and was due for dialysis today.  Patient was significantly hypoxic on initial EMS evaluation with room air sats somewhere around 70%.  Patient placed on CPAP during transport.  Patient is obtunded on arrival.  However, with placement of BiPAP and increased FiO2 patient's mental status improved significantly.  Intubation was avoided.    The history is provided by the patient and medical records.       Home Medications Prior to Admission medications   Medication Sig Start Date End Date Taking? Authorizing Provider  acetaminophen (TYLENOL) 500 MG tablet Take 2 tablets (1,000 mg total) by mouth 3 (three) times daily. For 3 to 5 days and then can consider changing it to as needed based on improvement of her musculoskeletal chest pain related to rib fractures. Patient taking differently: Take 1,000 mg by mouth every 8 (eight) hours as needed for mild pain. 07/16/21   Modena Jansky, MD  albuterol (PROVENTIL) (2.5 MG/3ML) 0.083% nebulizer solution Inhale 2.5 mg into the lungs every 4 (four) hours as needed for wheezing or shortness of breath. 02/16/20   [provider]  albuterol (VENTOLIN HFA) 108 (90 Base) MCG/ACT inhaler Inhale 2 puffs into the lungs every 4 (four) hours as needed for shortness of breath. 04/02/20   [provider]  azithromycin (ZITHROMAX) 250 MG tablet Take 1 tablet (250 mg total) by mouth daily. 10/22/21   Thurnell Lose, MD  carvedilol (COREG) 25 MG tablet Take 1 tablet (25 mg total) by mouth See admin instructions. Take 25 mg twice daily on nondailysis  days (Sun, Mon, Wed, and Fri) Patient taking differently: Take 25 mg by mouth in the morning and at bedtime. 07/26/21   Setzer, Edman Circle, PA-C  cinacalcet (SENSIPAR) 30 MG tablet Take 1 tablet (30 mg total) by mouth Every Tuesday,Thursday,and Saturday with dialysis. 07/27/21   Setzer, Edman Circle, PA-C  cloNIDine (CATAPRES) 0.1 MG tablet Take 1 tablet (0.1 mg total) by mouth 2 (two) times daily. 07/26/21   Setzer, Edman Circle, PA-C  clopidogrel (PLAVIX) 75 MG tablet Take 75 mg by mouth daily.    Setzer, Edman Circle, PA-C  docusate sodium (COLACE) 100 MG capsule Take 1 capsule (100 mg total) by mouth 2 (two) times daily. 07/16/21   Hongalgi, Lenis Dickinson, MD  doxercalciferol (HECTOROL) 4 MCG/2ML injection Inject 2.5 mLs (5 mcg total) into the vein Every Tuesday,Thursday,and Saturday with dialysis. 07/27/21   Setzer, Edman Circle, PA-C  ferric citrate (AURYXIA) 1 GM 210 MG(Fe) tablet Take 1 tablet (210 mg total) by mouth 3 (three) times daily with meals. 07/26/21   Setzer, Edman Circle, PA-C  fluticasone-salmeterol (ADVAIR HFA) 989-21 MCG/ACT inhaler Inhale 1 puff into the lungs 2 (two) times daily. Patient taking differently: Inhale 1 puff into the lungs 2 (two) times daily as needed (shortness of breath). 07/26/21   Setzer, Edman Circle, PA-C  lidocaine (LIDODERM) 5 % Place 2 patches onto the skin daily. Remove & Discard patch within 12 hours or as directed by MD 07/26/21   Vaughan Basta, Edman Circle, PA-C  lidocaine-prilocaine (EMLA) cream Apply 1 application  topically 3 (three) times a week. 04/05/21   [provider]  Menthol-Methyl Salicylate (MUSCLE RUB) 10-15 % CREA Apply 1 application. topically daily as needed for muscle pain.    [provider]  methylPREDNISolone (MEDROL DOSEPAK) 4 MG TBPK tablet Follow package directions 10/22/21   Thurnell Lose, MD  mirtazapine (REMERON) 15 MG tablet Take 15 mg by mouth daily. 10/04/21   [provider]  NIFEdipine (PROCARDIA-XL/NIFEDICAL-XL) 30 MG 24 hr tablet  Take 2 tablets (60 mg total) by mouth at bedtime. Patient taking differently: Take 30 mg by mouth in the morning and at bedtime. 07/26/21   Setzer, Edman Circle, PA-C  omeprazole (PRILOSEC) 20 MG capsule Take 2 capsules (40 mg total) by mouth daily. Patient taking differently: Take 40 mg by mouth 2 (two) times daily before a meal. 07/26/21   Setzer, Edman Circle, PA-C  Polyethyl Glycol-Propyl Glycol (SYSTANE OP) Place 1 drop into both eyes daily as needed (dry eyes).    [provider]  polyethylene glycol (MIRALAX / GLYCOLAX) 17 g packet Take 17 g by mouth daily. Patient taking differently: Take 17 g by mouth daily as needed for mild constipation. 07/26/21   Setzer, Edman Circle, PA-C  rOPINIRole (REQUIP) 0.5 MG tablet Take 0.5 mg by mouth in the morning and at bedtime. 05/03/21   [provider]  simvastatin (ZOCOR) 20 MG tablet Take 20 mg by mouth at bedtime. 05/03/21   [provider]  traZODone (DESYREL) 50 MG tablet TAKE 1 TABLET(50 MG) BY MOUTH AT BEDTIME AS NEEDED FOR SLEEP Patient taking differently: Take 50 mg by mouth at bedtime. 09/02/21   Bayard Hugger, NP      Allergies    Penicillins, Prednisone, Scallops [shellfish allergy], and Tramadol    Review of Systems   Review of Systems  All other systems reviewed and are negative.   Physical Exam Updated Vital Signs BP (!) 190/65   Pulse 71   Resp (!) 21   SpO2 100%  Physical Exam Vitals and nursing note reviewed.  Constitutional:      General: She is not in acute distress.    Appearance: She is well-developed.     Comments: Initially obtunded, within minutes of arrival after placement of BiPAP and increased FiO2 patient's mental status improved remarkably.  Patient avoided intubation secondary to increased and improved mentation.  HENT:     Head: Normocephalic and atraumatic.  Eyes:     Conjunctiva/sclera: Conjunctivae normal.     Pupils: Pupils are equal, round, and reactive to light.  Cardiovascular:      Rate and Rhythm: Normal rate and regular rhythm.     Heart sounds: Normal heart sounds.  Pulmonary:     Effort: Pulmonary effort is normal. No respiratory distress.     Comments: Decreased breath sounds at bases Abdominal:     General: There is no distension.     Palpations: Abdomen is soft.     Tenderness: There is no abdominal tenderness.  Musculoskeletal:        General: No deformity. Normal range of motion.     Cervical back: Normal range of motion and neck supple.     Comments: AV fistula on left upper extremity with palpable thrill and no overlying erythema  Skin:    General: Skin is warm and dry.  Neurological:     General: No focal deficit present.     ED Results / Procedures / Treatments   Labs (all labs ordered are listed,  but only abnormal results are displayed) Labs Reviewed  I-STAT ARTERIAL BLOOD GAS, ED - Abnormal; Notable for the following components:      Result Value   pH, Arterial 7.239 (*)    pCO2 arterial 65.0 (*)    pO2, Arterial 499 (*)    HCT 30.0 (*)    Hemoglobin 10.2 (*)    All other components within normal limits  I-STAT CHEM 8, ED - Abnormal; Notable for the following components:   BUN 71 (*)    Creatinine, Ser 10.30 (*)    Glucose, Bld 247 (*)    All other components within normal limits  CULTURE, BLOOD (ROUTINE X 2)  CULTURE, BLOOD (ROUTINE X 2)  MRSA NEXT GEN BY PCR, NASAL  RESP PANEL BY RT-PCR (FLU A&B, COVID) [id number]  PROTIME-INR  COMPREHENSIVE METABOLIC PANEL  CBC WITH DIFFERENTIAL/PLATELET  BRAIN NATRIURETIC PEPTIDE  LACTIC ACID, PLASMA  LACTIC ACID, PLASMA  CBC WITH DIFFERENTIAL/PLATELET  TROPONIN I (HIGH SENSITIVITY)    EKG EKG Interpretation  Date/Time:  Saturday March 22 2022 08:14:05 EDT Ventricular Rate:  69 PR Interval:  161 QRS Duration: 107 QT Interval:  415 QTC Calculation: 445 R Axis:   73 Text Interpretation: Sinus rhythm Atrial premature complex Confirmed by Dene Gentry [phone]) on 03/22/2022 8:29:56  AM  Radiology DG Chest Portable 1 View  Result Date: 03/22/2022 CLINICAL DATA:  Shortness of breath.  Respiratory distress. EXAM: PORTABLE CHEST 1 VIEW COMPARISON:  November 14, 2021 FINDINGS: New patchy opacity in the right mid lung is identified. The cardiomediastinal silhouette is stable. No pneumothorax. Mild opacity in left base is probably atelectasis. No overt edema. IMPRESSION: New infiltrate in the right mid lung worrisome for pneumonia. Recommend short-term follow-up imaging to ensure resolution. Electronically Signed   By: Dorise Bullion III M.D.   On: 03/22/2022 08:03    Procedures Procedures    Medications Ordered in ED Medications  vancomycin (VANCOREADY) IVPB 1250 mg/250 mL (has no administration in time range)  ceFEPIme (MAXIPIME) 2 g in sodium chloride 0.9 % 100 mL IVPB (has no administration in time range)  ceFEPIme (MAXIPIME) 1 g in sodium chloride 0.9 % 100 mL IVPB (has no administration in time range)  vancomycin variable dose per unstable renal function (pharmacist dosing) (has no administration in time range)    ED Course/ Medical Decision Making/ A&P                           Medical Decision Making Amount and/or Complexity of Data Reviewed Labs: ordered. Radiology: ordered.  Risk Prescription drug management. Decision regarding hospitalization.    Medical Screen Complete  This patient presented to the ED with complaint of AMS, respiratory distress.  This complaint involves an extensive number of treatment options. The initial differential diagnosis includes, but is not limited to, fluid overload, metabolic abnormality, infection, etc.  This presentation is: Acute, Chronic, Self-Limited, Previously Undiagnosed, Uncertain Prognosis, Complicated, Systemic Symptoms, and Threat to Life/Bodily Function  Patient presented with respiratory distress and altered mental status.  With improved FiO2 and placement of BiPAP mentation improved  significantly.  Patient avoided intubation.  Work-up is concerning for possible fluid overload secondary to missed dialysis and possible concurrent pulmonary infection.  Broad-spectrum antibiotics initiated.  Dr. Jonnie Finner with nephrology is aware case will evaluate.  Teaching team is aware of case and will evaluate for admission.   Co morbidities that complicated the patient's evaluation  COPD, ESRD on HD  Additional history obtained:  Additional history obtained from EMS External records from outside sources obtained and reviewed including prior ED visits and prior Inpatient records.    Lab Tests:  I ordered and personally interpreted labs.  The pertinent results include: ABG, i-STAT Chem-8, CBC CMP Trop Lactic    Imaging Studies ordered:  I ordered imaging studies including CXR  I independently visualized and interpreted obtained imaging which showed CHF versus infiltrate I agree with the radiologist interpretation.   Cardiac Monitoring:  The patient was maintained on a cardiac monitor.  I personally viewed and interpreted the cardiac monitor which showed an underlying rhythm of: NSR   Medicines ordered:  I ordered medication including broad spectrum antibiotics for infection  Reevaluation of the patient after these medicines showed that the patient: improved    Problem List / ED Course:  Respiratory distress, AMS   Reevaluation:  After the interventions noted above, I reevaluated the patient and found that they have: improved   Disposition:  After consideration of the diagnostic results and the patients response to treatment, I feel that the patent would benefit from admission.    CRITICAL CARE Performed by: Valarie Merino   Total critical care time: 30 minutes  Critical care time was exclusive of separately billable procedures and treating other patients.  Critical care was necessary to treat or prevent imminent or life-threatening  deterioration.  Critical care was time spent personally by me on the following activities: development of treatment plan with patient and/or surrogate as well as nursing, discussions with consultants, evaluation of patient's response to treatment, examination of patient, obtaining history from patient or surrogate, ordering and performing treatments and interventions, ordering and review of laboratory studies, ordering and review of radiographic studies, pulse oximetry and re-evaluation of patient's condition.         Final Clinical Impression(s) / ED Diagnoses Final diagnoses:  Respiratory distress    Rx / DC Orders ED Discharge Orders     None         Valarie Merino, MD 03/22/22 [phone]

## 2022-03-22 NOTE — Progress Notes (Signed)
ABG collected by RT. MD notified of results. No new orders given at this time.     03/22/22 08:14  Sample type  ARTERIAL  pH, Arterial  7.239 (L)  pCO2 arterial  65.0 (H)  pO2, Arterial  499 (H)  TCO2  30  Acid-base deficit  1.0  Bicarbonate  27.8  O2 Saturation  100  Collection site  RADIAL, ALLEN'S TEST ACCEPTABLE

## 2022-03-22 NOTE — H&P (Signed)
Date: 03/22/2022               Patient Name:  Stacey Dorsey MRN: [id number]  DOB: February 22, 1933 Age / Sex: 88 y.o., female   PCP: Bartholome Bill, MD         Medical Service: Internal Medicine Teaching Service         Attending Physician: Dr. Jimmye Norman Elaina Pattee, MD    First Contact: Dr. Angelique Blonder Pager: [phone]  Second Contact: Dr. 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 Patient reports increase cough and sputum production for the last 2 weeks. Went to urgent care on 8/23 and discharged on azithromycin and prednisone. Missed HD on 8/24 as she was feeling unwell. Yesterday on 8/25 feeling better was able to go out shopping with her daughter who she lives with. This morning patient called daughter around 6:45 am saying she could not breath. Tried to give herself a breathing treatment but was too anxious. Daughter subsequently called EMS.   Per ED provider patient was placed on NRB and given albuterol and solumedrol. She subsequently desatted into the 60s and was placed on CPAP with decrease in mental status. She arrived in ED obtunded and was placed on BiPAP with improvement in mental status back to baseline.   Patient currently states her breathing is feeling better. She has a history of COPD, not on O2 at home, ususe albuterol at home as needed. Last used Does feel the BiPAP is uncomfortable and still has some wheezing. She denies fever, chills, chest pain, palpitations, abdominal pain, n/v/d, syncope, dizziness, or lightheadedness.   Meds:  Albuterol inhaler ASA 325 mg daily patient reports she is not taking Ferric citrate TID Carvedilol  12.5 mg twice daily Clonidine 0.1 mg twice daily Plavix 25 mg daily reports she is not taking this Aranesp 60 mcg  advair 2 puff twice daily Megestrol 10 mL daily  Mirtazapine 15 mg nightly Nifedipine 60 mg 24 hour tablet daily Omeprazole 40 mg daily Ropinirole 0.5 mg daily Senna-docusate PRN Simvastatin 20 mg daily Trazodone 50 mg at night   Allergies: Allergies as of 03/22/2022 - Review Complete 03/22/2022  Allergen Reaction Noted   Penicillins Rash 02/08/2020   Prednisone Other (See Comments) 02/15/2020   Scallops [shellfish allergy]  03/26/2020   Tramadol Anxiety 07/18/2021   Past Medical History:  Diagnosis Date   COPD (chronic obstructive pulmonary disease) (Stacey Dorsey)    Diabetes mellitus without complication (HCC)    ESRD on hemodialysis (HCC)    TTS HD at [facility]   Hyperlipidemia    Hypertension     Family History: DM(daughter, grandson), HTN( daughter), brain cancer (daughter), arthitis (sister)   Social History: Lives with daughter, granddaughter, great grandson, and great great grandson. Is independent in ADLs, daughter drives the patient but she is otherwise independent. Patient has a walker and cane but does not use them regularly per daughter. Patient has never smoked, drinks alcohol about once a month. No illicit drug use.  PCP: Bartholome Bill, MD [facility]   Review of Systems: A complete ROS was negative except as per  HPI.   Physical Exam: Blood pressure (!) 127/53, pulse 61, resp. rate (!) 22, SpO2 100 %. Constitutional: resting in bed laying on left side, appears tired Eyes: PERRL, EOMI HENT: Bipap in place, atraumatic, normocephalic Cardiovascular: normal rate, regular rhythm, systolic murmur best heard a left sternal border, no LE edema, no JVD Respiratory: On bipap, decrease breath sound bilaterally, occasional expiratory wheezes, no crackles  GI: non tender, active BS, no distention  Musculoskeletal: Normal bulk and  tone.  Neurological: Is alert and oriented x4, no apparent focal deficits noted. Skin: Warm and dry.  Normal skin turgor Psychiatric: Normal mood and affect.   EKG: personally reviewed my interpretation is  HR 69 sinus ryhtmn, LAH, no acute ischemic changes  CXR: personally reviewed my interpretation is new consolidation of the right mid line, increased vascular congestion, no pleural effusions   Assessment & Plan by Problem: Principal Problem:   Acute respiratory failure with hypoxia (HCC)  Acute hypercarbic and hypoxic respiratory failure CAP Patient with 2 weeks of productive cough has been on prednisone 20 mg daily and azithromycin. Worsening dyspnea this morning. Desaturating on CPAP obtunded. She was placed on BiPAP with improvement. ABG with respiratory acidosis pH 7.2, pCO2 65. pO2 499. Afebrile, leukocytosis of 14.3 has been on prednisone. Lactic acid 2.2>1.1. Elevated troponin 45>51 likely due to demand ischemia from hypoxia. CXR RML consolidation. Prior CXR on 8/24 at urgent care reported as normal. Respiratory distress likely multifactorial from CAP, COPD exacerbation, and possibly fluid overload as she missed Thursday. Respiratory status much improved on BiPAP and transitioned to NC3L as she could not tolerate wearing the mask. - Started on vanc and cefepime in ED, narrow to rocephin and azithromycin - duonebs, prednisone for COPD exacerbation - HD today per nephology - Repeat troponin - repeat ABG - Follow up blood cultures, MRSA swab, procal, RVP - Monitor fever curve, CBC - O2 as needed to keep saturation between 88 and 92%  COPD exacerbation Increased cough, dyspnea, and sputum production. On advair and albuterol at home, does not use Advair regularly. Reviewed IV solumedrol with EMS today. - duonebs q4h - prednisone 40 mg daily - dulera 2 puffs twice daily - O2 as needed to keep saturation between 88 and 92%  ESRD on HD TTS Reports due to hypertensive renal disease.  ON HD TTS missed last session on Thursday is due to HD today. Denies missing any other sessions. Some vascular congestion on CXR.  - HD today per nephology - Appreciated nephrology recommendations - continue cinacalcet - monitor I/o, weights - avoid nephrotoxic medications  Diet controlled DM Glucose elevated in 200s on BMP. Possibly in setting of steroid use. -Monitor CBGs  Anemia on chronic kidney disease Hgb 10.2 today appears near baseline.   HTN HLD BP elevated  to 184/61. Lipid panel cholesterol 06/2021 t cholesterol 139, trig 92, LDL 73, HDL 48. -Continue clonididn 0.1 mg twice daily, coreg 12.5 mg twice daily, nifedipine 60 mg daily  - Continue simvastatin 20 mg daily  HFpEF Last echo on 10/19/2020 EG 82-50% grade I diastolic dysfunction, dilated left atria, mild MR and MS, aortic stenosis.  - HD today for fluid -continue coreg 12.5 mg daily  Restless legs - continue ropinirole   Depression Takes mirtazapine for appetite and Trazadone nightly for sleep  Prior CVA LMCA cryptogenic stroke, was on DAPT but states she is not taking this. Had cardiology follow up and 24 cardiac monitor negative for arrhythmia. Did not follow up to discuss loop recorder.  Remains in NSR today.  Diet:  HH Fluids: None Code: Full VTE; Heparin  Dispo: Admit patient to Inpatient with expected length of stay greater than 2 midnights.  Signed: Iona Beard, MD 03/22/2022, 11:46 AM  Pager: [phone] After 5pm on weekdays and 1pm on weekends: On Call pager: [phone]

## 2022-03-23 ENCOUNTER — Encounter (HOSPITAL_COMMUNITY): Payer: Self-pay | Admitting: Internal Medicine

## 2022-03-23 ENCOUNTER — Inpatient Hospital Stay (HOSPITAL_COMMUNITY): Payer: Medicare Other

## 2022-03-23 LAB — BASIC METABOLIC PANEL
Anion gap: 18 — ABNORMAL HIGH (ref 5–15)
BUN: 42 mg/dL — ABNORMAL HIGH (ref 8–23)
CO2: 23 mmol/L (ref 22–32)
Calcium: 9 mg/dL (ref 8.9–10.3)
Chloride: 97 mmol/L — ABNORMAL LOW (ref 98–111)
Creatinine, Ser: 6.47 mg/dL — ABNORMAL HIGH (ref 0.44–1.00)
GFR, Estimated: 6 mL/min — ABNORMAL LOW (ref >60)
Glucose, Bld: 261 mg/dL — ABNORMAL HIGH (ref 70–99)
Potassium: 4.1 mmol/L (ref 3.5–5.1)
Sodium: 138 mmol/L (ref 135–145)

## 2022-03-23 LAB — CBC
HCT: 29 % — ABNORMAL LOW (ref 36.0–46.0)
Hemoglobin: 9.4 g/dL — ABNORMAL LOW (ref 12.0–15.0)
MCH: 29.3 pg (ref 26.0–34.0)
MCHC: 32.4 g/dL (ref 30.0–36.0)
MCV: 90.3 fL (ref 80.0–100.0)
Platelets: 195 10*3/uL (ref 150–400)
RBC: 3.21 MIL/uL — ABNORMAL LOW (ref 3.87–5.11)
RDW: 15.9 % — ABNORMAL HIGH (ref 11.5–15.5)
WBC: 7.9 10*3/uL (ref 4.0–10.5)
nRBC: 0 % (ref 0.0–0.2)

## 2022-03-23 LAB — TROPONIN I (HIGH SENSITIVITY): Troponin I (High Sensitivity): 52 ng/L — ABNORMAL HIGH (ref <18)

## 2022-03-23 LAB — GLUCOSE, CAPILLARY
Glucose-Capillary: 120 mg/dL — ABNORMAL HIGH (ref 70–99)
Glucose-Capillary: 146 mg/dL — ABNORMAL HIGH (ref 70–99)
Glucose-Capillary: 191 mg/dL — ABNORMAL HIGH (ref 70–99)
Glucose-Capillary: 257 mg/dL — ABNORMAL HIGH (ref 70–99)

## 2022-03-23 MED ORDER — INSULIN ASPART 100 UNIT/ML IJ SOLN
0.0000 [IU] | Freq: Three times a day (TID) | INTRAMUSCULAR | Status: DC
  Administered 2022-03-23: 1 [IU] via SUBCUTANEOUS
  Administered 2022-03-23: 2 [IU] via SUBCUTANEOUS
  Administered 2022-03-24: 3 [IU] via SUBCUTANEOUS
  Administered 2022-03-24: 7 [IU] via SUBCUTANEOUS

## 2022-03-23 MED ORDER — IPRATROPIUM-ALBUTEROL 0.5-2.5 (3) MG/3ML IN SOLN
3.0000 mL | Freq: Three times a day (TID) | RESPIRATORY_TRACT | Status: DC
Start: 2022-03-23 — End: 2022-03-23
  Administered 2022-03-23: 3 mL via RESPIRATORY_TRACT
  Filled 2022-03-23: qty 3

## 2022-03-23 MED ORDER — ALBUTEROL SULFATE (2.5 MG/3ML) 0.083% IN NEBU
2.5000 mg | INHALATION_SOLUTION | RESPIRATORY_TRACT | Status: DC | PRN
Start: 2022-03-23 — End: 2022-03-25

## 2022-03-23 NOTE — Progress Notes (Cosign Needed Addendum)
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   Overnight Events: none  Stacey Dorsey reports feeling improved today. Denies shortness of breath and not requiring O2 now. Has some coughing. No fevers, chills, nausea or vomiting.   Objective:  Vital signs in last 24 hours: Vitals:   03/23/22 0850 03/23/22 1011 03/23/22 1200 03/23/22 1600  BP:   (!) 147/86 (!) 128/106  Pulse:   74 82  Resp:   20 20  Temp:   97.9 F (36.6 C)   TempSrc:   Oral   SpO2: 98% 93% 95%   Weight:      Height:       Supplemental O2: Room Air   Physical Exam:  Constitutional: well-appearing, sitting in chair comfortably, in no acute distress HENT: normocephalic atraumatic Neck: supple Cardiovascular: regular rate and rhythm, systolic murmur at left sternal border Pulmonary/Chest: normal work of breathing on room air, crackles noted on right mid-lower MSK: normal bulk and tone Neurological: alert & oriented x 3 Skin: warm and dry Psych: normal mood and behavior  Filed Weights   03/22/22 2300 03/23/22 0500  Weight: 59 kg 59 kg    No intake or output data in the 24 hours ending 03/23/22 1928 Net IO Since Admission: -3,000 mL [03/23/22 1928]  Pertinent Labs:    Latest Ref Rng & Units 03/23/2022   12:27 AM 03/22/2022   12:37 PM 03/22/2022    8:45 AM  CBC  WBC 4.0 - 10.5 K/uL 7.9   14.3   Hemoglobin 12.0 - 15.0 g/dL 9.4  10.5  10.2   Hematocrit 36.0 - 46.0 % 29.0  31.0  32.9   Platelets 150 - 400 K/uL 195   200        Latest Ref Rng & Units 03/23/2022   12:27 AM 03/22/2022   12:37 PM 03/22/2022    8:14 AM  CMP  Glucose 70 - 99 mg/dL 261     BUN 8 - 23 mg/dL 42     Creatinine 0.44 - 1.00 mg/dL 6.47     Sodium 135 - 145 mmol/L 138  138  137   Potassium 3.5 -  5.1 mmol/L 4.1  5.1  4.3   Chloride 98 - 111 mmol/L 97     CO2 22 - 32 mmol/L 23     Calcium 8.9 - 10.3 mg/dL 9.0       Imaging: DG Chest 2 View  Result Date: 03/23/2022 CLINICAL DATA:  Follow-up pneumonia. EXAM: CHEST - 2 VIEW COMPARISON:  03/22/2022 FINDINGS: Mild cardiomegaly and ectasia of the thoracic aorta remains stable. Right perihilar and bilateral lower lung airspace disease shows significant improvement since previous study. Small right pleural effusion persists. Elevation of right hemidiaphragm again noted. No new or worsening areas of pulmonary opacity are seen. IMPRESSION: Significant improvement in bilateral airspace disease. Persistent small right pleural effusion. Electronically Signed   By: Marlaine Hind M.D.   On: 03/23/2022 17:58    Assessment/Plan:   Principal Problem:   Acute respiratory failure with hypoxia [facility])   Patient Summary: Stacey Dorsey is a 88 y.o. 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    Acute hypercarbic and hypoxic respiratory failure 2/2 CAP, COPD, HFpEF Respiratory status improved and not requiring O2 now. CXR showed RML consolidation.  Respiratory distress could likely multifactorial from CAP, COPD and volume overload. Procalcitonin 1.64. RVP negative. MRSA nasal negative. Bicarbonate improved to 23. She is afebrile and leukocytosis resolved. Repeat CXR showed improvement but has small right pleural effusion.  -rocephin and azithromycin, day 2 -duonebs, prednisone x 4 doses for COPD -pending BC  COPD  Patient denies any dyspnea today.  Still having productive cough.  On Advair and albuterol at home but does not use the Advair regularly. -duonebs, prednisone x 4 doses  -dulera 2 puffs BID -O2 as needed to keep saturation 88-92%  ESRD on HD TTS Received HD yesterday.   Patient reports missing dialysis on Thursday.  Appreciate nephrology assistance with her care. -continue cinacalcet -monitor I/Os and weights  T2DM controlled with diet CBGs improved. Fasting glucose this morning was 120. She is on steroids so will continue to monitor.  -CBGs ACHS   HTN HLD BP improved from yesterday. She is on carvedilol 12.5 mg BID, clonidine 0.1 mg BID, nifedipine 60 mg daily. On simvastatin 20 mg daily.  -continue home meds   HFpEF BNP was >4500. Patient did miss HD on Thursday. Last echo on 09/2021 showed EF 55-60%. HD yesterday of 3L with markedly improvement of her dyspnea.  -carvedilol 12.5 mg daily  Anemia of CKD Hgb 9.4 from 10.2. Baseline appears to be around 9-10. Continue to monitor.   Prior CVA L MCA CVA in 06/2021. She does not recall DAPT. She thinks she has clopidogrel at home but does not take it because she is unaware what it was for when it was prescribed.    Diet: Heart Healthy IVF: None,None VTE: Heparin Code: Full PT/OT recs:  outpatient PT (vestibular assessment) , none. Family Update: lives with daughter and her family  Dispo: Anticipated discharge to Home in 1-2 days pending acute conditions.   Angelique Blonder, DO Internal Medicine Resident PGY-1 Please contact the on call pager after 5 pm and on weekends at [phone].

## 2022-03-23 NOTE — Progress Notes (Signed)
Physical Therapy Evaluation Patient Details Name: Stacey Dorsey MRN: [id number] DOB: 11-14-1932 Today's Date: 03/23/2022  History of Present Illness  88 y.o. female admitted 8/26 with respiratory distress and altered mental status. PMH:  ESRD on HD TTS, COPD, diabetes mellitus, hypertension, hyperlipidemia, chronic diastolic CHF  Clinical Impression  Pt admitted with above diagnosis. Grossly mobilizing at a supervision level, using a RW due to mild instability and is able to self correct with this AD. Pt reports new onset of positional vertigo when she sits up from a supine position (<30 seconds, no nystagmus noted today however not formally tested with dix-hallpike.) Pt was lightheaded while ambulating however VSS with SPO2 low to mid 90s on RA,  BP 170/69 with minimal fluctuation between sitting and standing, HR 80s to 90s.  currently with functional limitations due to the deficits listed below (see PT Problem List). Pt will benefit from skilled PT to increase their independence and safety with mobility to allow discharge to the venue listed below.          Recommendations for follow up therapy are one component of a multi-disciplinary discharge planning process, led by the attending physician.  Recommendations may be updated based on patient status, additional functional criteria and insurance authorization.  Follow Up Recommendations Outpatient PT (For vestibular assessment if pt continues to experience positional vertigo.)      Assistance Recommended at Discharge Set up Supervision/Assistance  Patient can return home with the following  Assistance with cooking/housework;Assist for transportation;Help with stairs or ramp for entrance    Equipment Recommendations None recommended by PT  Recommendations for Other Services       Functional Status Assessment Patient has had a recent decline in their functional status and demonstrates the ability to make significant improvements in function  in a reasonable and predictable amount of time.     Precautions / Restrictions Precautions Precautions: None Restrictions Weight Bearing Restrictions: No      Mobility  Bed Mobility Overal bed mobility: Modified Independent             General bed mobility comments: extra time    Transfers Overall transfer level: Needs assistance Equipment used: Rolling walker (2 wheels) Transfers: Sit to/from Stand Sit to Stand: Supervision           General transfer comment: supervision for safety, performed x2 without LOB.    Ambulation/Gait Ambulation/Gait assistance: Supervision Gait Distance (Feet): 145 Feet Assistive device: Rolling walker (2 wheels) Gait Pattern/deviations: Step-through pattern, Decreased stride length, Drifts right/left Gait velocity: decreased Gait velocity interpretation: <1.8 ft/sec, indicate of risk for recurrent falls   General Gait Details: Educated on safe AD use with RW. Assist provided for line/lead management, close supervision for safety, with mild instability noted but able to self correct with assistive device. Short distance in room to recliner without AD, pt furniture walking part-way, no overt LOB without AD. VSS however pt reports dizziness throughout bout which resolved immediately upon sitting.  Stairs            Wheelchair Mobility    Modified Rankin (Stroke Patients Only)       Balance Overall balance assessment: Needs assistance, Mild deficits observed, not formally tested Sitting-balance support: No upper extremity supported, Feet supported Sitting balance-Leahy Scale: Normal     Standing balance support: No upper extremity supported, During functional activity Standing balance-Leahy Scale: Fair  Pertinent Vitals/Pain Pain Assessment Pain Assessment: No/denies pain    Home Living Family/patient expects to be discharged to:: Private residence Living Arrangements:  Children (Daughter - Nigel Sloop) Available Help at Discharge: Family Type of Home: House Home Access: Stairs to enter Entrance Stairs-Rails: None Entrance Stairs-Number of Steps: 2   Home Layout: Two level;Able to live on main level with bedroom/bathroom Home Equipment: Rolling Walker (2 wheels);Cane - single point;Hand held shower head;Grab bars - tub/shower;Shower seat;Rollator (4 wheels)      Prior Function Prior Level of Function : Independent/Modified Independent             Mobility Comments: Ind at home, does not use AD in the house, but takes cane or rollator into community. Daughter drives her places. ADLs Comments: Daughter does the cooking     Hand Dominance   Dominant Hand: Right    Extremity/Trunk Assessment   Upper Extremity Assessment Upper Extremity Assessment: Defer to OT evaluation    Lower Extremity Assessment Lower Extremity Assessment: Generalized weakness       Communication   Communication: No difficulties  Cognition Arousal/Alertness: Awake/alert Behavior During Therapy: WFL for tasks assessed/performed Overall Cognitive Status: Within Functional Limits for tasks assessed                                          General Comments      Exercises     Assessment/Plan    PT Assessment Patient needs continued PT services  PT Problem List Decreased strength;Decreased activity tolerance;Decreased balance;Decreased mobility;Decreased knowledge of use of DME       PT Treatment Interventions DME instruction;Gait training;Stair training;Functional mobility training;Therapeutic activities;Therapeutic exercise;Balance training;Neuromuscular re-education;Patient/family education    PT Goals (Current goals can be found in the Care Plan section)  Acute Rehab PT Goals Patient Stated Goal: Get well and go home PT Goal Formulation: With patient Time For Goal Achievement: 03/30/22 Potential to Achieve Goals: Good    Frequency  Min 3X/week     Co-evaluation               AM-PAC PT "6 Clicks" Mobility  Outcome Measure Help needed turning from your back to your side while in a flat bed without using bedrails?: None Help needed moving from lying on your back to sitting on the side of a flat bed without using bedrails?: None Help needed moving to and from a bed to a chair (including a wheelchair)?: A Little Help needed standing up from a chair using your arms (e.g., wheelchair or bedside chair)?: A Little Help needed to walk in hospital room?: A Little Help needed climbing 3-5 steps with a railing? : A Little 6 Click Score: 20    End of Session Equipment Utilized During Treatment: Gait belt Activity Tolerance: Patient tolerated treatment well Patient left: in chair;with call bell/phone within reach;with nursing/sitter in room Nurse Communication: Other (comment) (Dizziness) PT Visit Diagnosis: Muscle weakness (generalized) (M62.81);Unsteadiness on feet (R26.81)    Time: 2706-2376 PT Time Calculation (min) (ACUTE ONLY): 28 min   Charges:   PT Evaluation $PT Eval Low Complexity: 1 Low PT Treatments $Gait Training: 8-22 mins        Candie Mile, PT, DPT Physical Therapist Acute Rehabilitation Services [place]   Stacey Dorsey 03/23/2022, 10:43 AM

## 2022-03-23 NOTE — Progress Notes (Signed)
Subjective: Seen sitting up in bedside.  Denies shortness of breath said tolerated 3 L UF dialysis yesterday  Objective Vital signs in last 24 hours: Vitals:   03/23/22 0822 03/23/22 0850 03/23/22 1011 03/23/22 1200  BP: (!) 150/62   (!) 147/86  Pulse: 79   74  Resp: 17   20  Temp: 97.8 F (36.6 C)   97.9 F (36.6 C)  TempSrc: Axillary   Oral  SpO2: 98% 98% 93% 95%  Weight:      Height:       Weight change:   Physical Exam: General: Thin elderly female alert pleasant NAD , Riddle 2 L Heart: RRR,2/6 SEM, no RG Lungs: Decreased BS at bases otherwise CTA nonlabored breathing on 2 L nasal cannula oxygen Abdomen: NABS soft NT ND no ascites Extremities: No pedal edema Dialysis Access: LUA AVG + bruit    OP HD: AF TTS 3.5h  400/500   60.4kg  2/2 bath  P2  LUA AVG   Hep 2000 - mircera 75 q2, last  - doxercalciferol 5 mcg iv tiw - cinacalcet 30mg  po tiw - last HD 822, came off at dry wt   Problem/Plan: Chronic hypoxic hypercapnic respiratory failure= affected pulmonary edema/pneumonia/underlying COPD aspiration playing a role HD yesterday on schedule 3 liters UF with symptoms resolved now ESRD -HD TTS, on schedule History of COPD-noted admitted March this year acute hypercapnic respiratory failure treated with SoluMedro,l IV Mg, atrovent and bipap. Anemia of ESRD-Hgb 9.4 follow-up trend ESA 75 last given 8/ Secondary hyperparathyroidism - cor Ca okay phosphorus 7.4 needed compliance with OP phosphorus binder continue Auryxia, Sensipar with dialysis and also vitamin D on dialysis History of prior CVA =was on DAPT reports not taking this.  See admit team plan   Ernest Haber, PA-C [facility] Beeper [phone] 03/23/2022,1:52 PM  LOS: 1 day   Labs: Basic Metabolic Panel: Recent Labs  Lab 03/22/22 0756 03/22/22 0758 03/22/22 0814 03/22/22 1237 03/22/22 1300 03/23/22 0027  NA 139 141 137 138  --  138  K 4.5 4.4 4.3 5.1  --  4.1  CL 104 104  --   --   --  97*   CO2 16*  --   --   --   --  23  GLUCOSE 246* 247*  --   --   --  261*  BUN 70* 71*  --   --   --  42*  CREATININE 10.15* 10.30*  --   --   --  6.47*  CALCIUM 9.2  --   --   --   --  9.0  PHOS  --   --   --   --  7.4*  --    Liver Function Tests: Recent Labs  Lab 03/22/22 0756  AST 26  ALT 22  ALKPHOS 86  BILITOT 0.3  PROT 6.8  ALBUMIN 3.9   No results for input(s): "LIPASE", "AMYLASE" in the last 168 hours. No results for input(s): "AMMONIA" in the last 168 hours. CBC: Recent Labs  Lab 03/22/22 0845 03/22/22 1237 03/23/22 0027  WBC 14.3*  --  7.9  NEUTROABS 10.5*  --   --   HGB 10.2* 10.5* 9.4*  HCT 32.9* 31.0* 29.0*  MCV 94.5  --  90.3  PLT 200  --  195   Cardiac Enzymes: No results for input(s): "CKTOTAL", "CKMB", "CKMBINDEX", "TROPONINI" in the last 168 hours. CBG: Recent Labs  Lab 03/22/22 1938 03/22/22 2201 03/23/22 0829 03/23/22  [place]    Studies/Results: DG Chest Portable 1 View  Result Date: 03/22/2022 CLINICAL DATA:  Shortness of breath.  Respiratory distress. EXAM: PORTABLE CHEST 1 VIEW COMPARISON:  November 14, 2021 FINDINGS: New patchy opacity in the right mid lung is identified. The cardiomediastinal silhouette is stable. No pneumothorax. Mild opacity in left base is probably atelectasis. No overt edema. IMPRESSION: New infiltrate in the right mid lung worrisome for pneumonia. Recommend short-term follow-up imaging to ensure resolution. Electronically Signed   By: Dorise Bullion III M.D.   On: 03/22/2022 08:03   Medications:  azithromycin 500 mg (03/23/22 1258)   cefTRIAXone (ROCEPHIN)  IV Stopped (03/22/22 2323)    carvedilol  12.5 mg Oral BID WC   Chlorhexidine Gluconate Cloth  6 each Topical Q0600   cinacalcet  30 mg Oral Q T,Th,Sa-HD   cloNIDine  0.1 mg Oral BID   doxercalciferol  5 mcg Intravenous Q T,Th,Sa-HD   heparin  5,000 Units Subcutaneous Q8H   insulin aspart  0-9 Units Subcutaneous TID WC   mirtazapine  15  mg Oral QHS   mometasone-formoterol  2 puff Inhalation BID   NIFEdipine  60 mg Oral QHS   pantoprazole  40 mg Oral Daily   predniSONE  40 mg Oral Q breakfast   rOPINIRole  0.5 mg Oral Q2000   simvastatin  20 mg Oral QHS   traZODone  50 mg Oral QHS

## 2022-03-24 ENCOUNTER — Other Ambulatory Visit (HOSPITAL_COMMUNITY): Payer: Self-pay

## 2022-03-24 DIAGNOSIS — J9601 Acute respiratory failure with hypoxia: Secondary | ICD-10-CM

## 2022-03-24 DIAGNOSIS — N186 End stage renal disease: Secondary | ICD-10-CM | POA: Diagnosis not present

## 2022-03-24 DIAGNOSIS — I5030 Unspecified diastolic (congestive) heart failure: Secondary | ICD-10-CM | POA: Diagnosis not present

## 2022-03-24 DIAGNOSIS — Z992 Dependence on renal dialysis: Secondary | ICD-10-CM

## 2022-03-24 DIAGNOSIS — J449 Chronic obstructive pulmonary disease, unspecified: Secondary | ICD-10-CM | POA: Diagnosis not present

## 2022-03-24 DIAGNOSIS — E119 Type 2 diabetes mellitus without complications: Secondary | ICD-10-CM

## 2022-03-24 LAB — BASIC METABOLIC PANEL
Anion gap: 11 (ref 5–15)
BUN: 74 mg/dL — ABNORMAL HIGH (ref 8–23)
CO2: 24 mmol/L (ref 22–32)
Calcium: 8.5 mg/dL — ABNORMAL LOW (ref 8.9–10.3)
Chloride: 101 mmol/L (ref 98–111)
Creatinine, Ser: 8.46 mg/dL — ABNORMAL HIGH (ref 0.44–1.00)
GFR, Estimated: 4 mL/min — ABNORMAL LOW (ref >60)
Glucose, Bld: 168 mg/dL — ABNORMAL HIGH (ref 70–99)
Potassium: 4.2 mmol/L (ref 3.5–5.1)
Sodium: 136 mmol/L (ref 135–145)

## 2022-03-24 LAB — GLUCOSE, CAPILLARY
Glucose-Capillary: 314 mg/dL — ABNORMAL HIGH (ref 70–99)
Glucose-Capillary: 68 mg/dL — ABNORMAL LOW (ref 70–99)
Glucose-Capillary: 214 mg/dL — ABNORMAL HIGH (ref 70–99)

## 2022-03-24 LAB — CBC
HCT: 25.9 % — ABNORMAL LOW (ref 36.0–46.0)
Hemoglobin: 8.5 g/dL — ABNORMAL LOW (ref 12.0–15.0)
MCH: 29 pg (ref 26.0–34.0)
MCHC: 32.8 g/dL (ref 30.0–36.0)
MCV: 88.4 fL (ref 80.0–100.0)
Platelets: 179 10*3/uL (ref 150–400)
RBC: 2.93 MIL/uL — ABNORMAL LOW (ref 3.87–5.11)
RDW: 16 % — ABNORMAL HIGH (ref 11.5–15.5)
WBC: 8.6 10*3/uL (ref 4.0–10.5)
nRBC: 0 % (ref 0.0–0.2)

## 2022-03-24 MED ORDER — CEPHALEXIN 500 MG PO CAPS
500.0000 mg | ORAL_CAPSULE | Freq: Four times a day (QID) | ORAL | 0 refills | Status: AC
  Filled 2022-03-24: qty 8, 2d supply, fill #0

## 2022-03-24 MED ORDER — DARBEPOETIN ALFA 100 MCG/0.5ML IJ SOSY: 100.0000 ug | PREFILLED_SYRINGE | INTRAMUSCULAR | Status: DC

## 2022-03-24 MED ORDER — BISACODYL 10 MG RE SUPP
10.0000 mg | Freq: Once | RECTAL | Status: DC
Start: 2022-03-24 — End: 2022-03-25

## 2022-03-24 MED ORDER — POLYETHYLENE GLYCOL 3350 17 G PO PACK: 17.0000 g | PACK | Freq: Every day | ORAL | Status: DC

## 2022-03-24 MED ORDER — PREDNISONE 20 MG PO TABS
40.0000 mg | ORAL_TABLET | Freq: Every day | ORAL | 0 refills | Status: AC
  Filled 2022-03-24: qty 4, 2d supply, fill #0

## 2022-03-24 NOTE — Discharge Summary (Signed)
Name: Stacey Dorsey MRN: [id number] DOB: Jan 15, 1933 88 y.o. PCP: Bartholome Bill, MD  Date of Admission: 03/22/2022  7:40 AM Date of Discharge: 03/24/22 Attending Physician: Dr. Daryll Drown  Discharge Diagnosis: Principal Problem:   Acute respiratory failure with hypoxia [facility])    Discharge Medications: Allergies as of 03/24/2022       Reactions   Penicillins Rash   Prednisone Other (See Comments)   insomnia   Scallops [shellfish Allergy]    Threw up blood Scallops only - can eat other shellfish   Tramadol Anxiety        Medication List     STOP taking these medications    azithromycin 250 MG tablet Commonly known as: ZITHROMAX   carvedilol 25 MG tablet Commonly known as: COREG   methylPREDNISolone 4 MG Tbpk tablet Commonly known as: MEDROL DOSEPAK       TAKE these medications    acetaminophen 500 MG tablet Commonly known as: TYLENOL Take 2 tablets (1,000 mg total) by mouth 3 (three) times daily. For 3 to 5 days and then can consider changing it to as needed based on improvement of her musculoskeletal chest pain related to rib fractures. What changed:  when to take this reasons to take this additional instructions   Advair HFA 115-21 MCG/ACT inhaler Generic drug: fluticasone-salmeterol Inhale 1 puff into the lungs 2 (two) times daily. What changed:  when to take this reasons to take this   albuterol (2.5 MG/3ML) 0.083% nebulizer solution Commonly known as: PROVENTIL Inhale 2.5 mg into the lungs every 4 (four) hours as needed for wheezing or shortness of breath.   albuterol 108 (90 Base) MCG/ACT inhaler Commonly known as: VENTOLIN HFA Inhale 2 puffs into the lungs every 4 (four) hours as needed for shortness of breath.   cephALEXin 500 MG capsule Commonly known as: KEFLEX Take 1 capsule (500 mg total) by mouth 4 (four) times daily for 2 days.   cinacalcet 30 MG tablet Commonly known as: SENSIPAR Take 1 tablet (30 mg total) by mouth Every  Tuesday,Thursday,and Saturday with dialysis.   cloNIDine 0.1 MG tablet Commonly known as: CATAPRES Take 1 tablet (0.1 mg total) by mouth 2 (two) times daily.   docusate sodium 100 MG capsule Commonly known as: COLACE Take 1 capsule (100 mg total) by mouth 2 (two) times daily.   doxercalciferol 4 MCG/2ML injection Commonly known as: HECTOROL Inject 2.5 mLs (5 mcg total) into the vein Every Tuesday,Thursday,and Saturday with dialysis.   ferric citrate 1 GM 210 MG(Fe) tablet Commonly known as: AURYXIA Take 1 tablet (210 mg total) by mouth 3 (three) times daily with meals. What changed: when to take this   lidocaine 5 % Commonly known as: LIDODERM Place 2 patches onto the skin daily. Remove & Discard patch within 12 hours or as directed by MD   lidocaine-prilocaine cream Commonly known as: EMLA Apply 1 application topically 3 (three) times a week.   mirtazapine 15 MG tablet Commonly known as: REMERON Take 15 mg by mouth daily.   NIFEdipine 30 MG 24 hr tablet Commonly known as: PROCARDIA-XL/NIFEDICAL-XL Take 2 tablets (60 mg total) by mouth at bedtime. What changed:  how much to take when to take this   omeprazole 20 MG capsule Commonly known as: PRILOSEC Take 2 capsules (40 mg total) by mouth daily. What changed: when to take this   polyethylene glycol 17 g packet Commonly known as: MIRALAX / GLYCOLAX Take 17 g by mouth daily.   predniSONE 20 MG  tablet Commonly known as: DELTASONE Take 2 tablets (40 mg total) by mouth daily with breakfast for 2 days. Start taking on: March 25, 2022   rOPINIRole 0.5 MG tablet Commonly known as: REQUIP Take 0.5 mg by mouth in the morning and at bedtime.   simvastatin 20 MG tablet Commonly known as: ZOCOR Take 20 mg by mouth at bedtime.   SYSTANE OP Place 1 drop into both eyes daily as needed (dry eyes).   traZODone 50 MG tablet Commonly known as: DESYREL TAKE 1 TABLET(50 MG) BY MOUTH AT BEDTIME AS NEEDED FOR SLEEP What  changed: See the new instructions.   VEGETABLE LAXATIVE PO Take 1 tablet by mouth See admin instructions. On dialysis days        Disposition and follow-up:   Stacey Dorsey was discharged from [facility] in Stable condition.  At the hospital follow up visit please address:  1.  Follow-up:  a. Acute hypercarbic and hypoxic respiratory failure - completed 3 days of azithromycin. Received 3 days of CTX, switched to Keflex for 2 days oral abx outpatient. 2 days of Prednisone. Bcx NGTD    b. COPD - on advair and albuterol at home. Discharged with 2 days steroids.    c. ESRD on HD TTS   d.  DM - diet controlled. Discharged with 2 day course steroids.   E. HFpEF/ HTN - complained of orthostatic symptoms. Orthostatics without significant change. HR low normal. BB held on discharge. Consider restarting at follow up.  2.  Labs / imaging needed at time of follow-up: CBC, BMP, CBG  3.  Pending labs/ test needing follow-up: BCx  4.  Medication Changes  Started: Keflex   Stopped: Coreg  Changed:   Abx -  Keflex  End Date: 8/30  Follow-up Appointments:  Follow-up [place] Follow up.   Specialty: Rehabilitation Why: Call to schedule apt for out patient physial therapy Contact information: [place]. [phone] [place]        Bartholome Bill, MD Follow up in 5 day(s).   Specialty: Family Medicine Why: Hospital follow up Contact information: [place] [address] [phone]         Vickie Epley, MD .   Specialties: Cardiology, Radiology Contact information: [address] [place] [address] [phone]                 Hospital Course by problem list:    Acute hypercarbic and hypoxic respiratory failure 2/2 CAP, COPD, HFpEF Patient noted to have CXR indicative of PNA with  fever and leukocytosis on admission . She was started on CTX and azithromycin for PNA. Bcx collected with NGTD. She was also given duonebs and prednisone for suspected COPD exacerbation. Clinically appeared much improved and repeat CXR showed improvement in pneumonia with small right pleural effusion. Patient had clinically improved and was discharged home with 2 additional days of steroids and oral antibiotics.   COPD  Received duonebs and prednisone during hospitalization. Subjective improvement in symptoms. To be discharged with 2 additional days of prednisone.   ESRD on HD TTS Nephrology consulted and managed with regular dialysis sessions.   T2DM controlled with diet  HFpEF - patient reported orthostatic symptoms. Orthostatics were checked without significant abnormality. HR was noted to be just above cutoff for bradycardia. BB already low dose so discontinued until she can  follow up as an outpatient.   Discharge Subjective: Patient reported dizziness when transitioning positions. Still endorsed some sputum production, overall breathing without complaint. After orthostatics checked, patient endorsed feeling well and was excited to discharge home.   Discharge Exam:   BP (!) 151/56 (BP Location: Right Arm)   Pulse 60   Temp 98.3 F (36.8 C) (Oral)   Resp 18   Ht 5\' 6"  (1.676 m)   Wt 61 kg   SpO2 94%   BMI 21.71 kg/m  Constitutional: well-appearing, sitting in chair comfortably, in no acute distress HENT: normocephalic atraumatic Neck: supple Cardiovascular: regular rate and rhythm, systolic murmur at left sternal border Pulmonary/Chest: normal work of breathing on room air, minimal crackles noted on right mid-lower MSK: normal bulk and tone Neurological: alert & oriented x 3 Skin: warm and dry Psych: normal mood and behavior  Pertinent Labs, Studies, and Procedures:     Latest Ref Rng & Units 03/24/2022    3:21 AM 03/23/2022   12:27 AM 03/22/2022   12:37 PM  CBC  WBC 4.0 -  10.5 K/uL 8.6  7.9    Hemoglobin 12.0 - 15.0 g/dL 8.5  9.4  10.5   Hematocrit 36.0 - 46.0 % 25.9  29.0  31.0   Platelets 150 - 400 K/uL 179  195         Latest Ref Rng & Units 03/24/2022    3:21 AM 03/23/2022   12:27 AM 03/22/2022   12:37 PM  CMP  Glucose 70 - 99 mg/dL 168  261    BUN 8 - 23 mg/dL 74  42    Creatinine 0.44 - 1.00 mg/dL 8.46  6.47    Sodium 135 - 145 mmol/L 136  138  138   Potassium 3.5 - 5.1 mmol/L 4.2  4.1  5.1   Chloride 98 - 111 mmol/L 101  97    CO2 22 - 32 mmol/L 24  23    Calcium 8.9 - 10.3 mg/dL 8.5  9.0      DG Chest 2 View  Result Date: 03/23/2022 CLINICAL DATA:  Follow-up pneumonia. EXAM: CHEST - 2 VIEW COMPARISON:  03/22/2022 FINDINGS: Mild cardiomegaly and ectasia of the thoracic aorta remains stable. Right perihilar and bilateral lower lung airspace disease shows significant improvement since previous study. Small right pleural effusion persists. Elevation of right hemidiaphragm again noted. No new or worsening areas of pulmonary opacity are seen. IMPRESSION: Significant improvement in bilateral airspace disease. Persistent small right pleural effusion. Electronically Signed   By: Marlaine Hind M.D.   On: 03/23/2022 17:58   DG Chest Portable 1 View  Result Date: 03/22/2022 CLINICAL DATA:  Shortness of breath.  Respiratory distress. EXAM: PORTABLE CHEST 1 VIEW COMPARISON:  November 14, 2021 FINDINGS: New patchy opacity in the right mid lung is identified. The cardiomediastinal silhouette is stable. No pneumothorax. Mild opacity in left base is probably atelectasis. No overt edema. IMPRESSION: New infiltrate in the right mid lung worrisome for pneumonia. Recommend short-term follow-up imaging to ensure resolution. Electronically Signed   By: Dorise Bullion III M.D.   On: 03/22/2022 08:03     Discharge Instructions: Discharge Instructions     Ambulatory referral to Physical Therapy   Complete by: As directed    Call MD for:  difficulty breathing, headache or  visual disturbances   Complete by: As directed    Call MD for:  extreme fatigue   Complete by: As directed    Call MD for:  hives   Complete by: As directed    Call MD for:  persistant dizziness or light-headedness   Complete by: As directed    Call MD for:  persistant nausea and vomiting   Complete by: As directed    Call MD for:  redness, tenderness, or signs of infection (pain, swelling, redness, odor or green/yellow discharge around incision site)   Complete by: As directed    Call MD for:  severe uncontrolled pain   Complete by: As directed    Call MD for:  temperature >100.4   Complete by: As directed    Diet - low sodium heart healthy   Complete by: As directed    Increase activity slowly   Complete by: As directed        Signed: Delene Ruffini, MD 03/24/2022, 2:11 PM   Pager: [phone]

## 2022-03-24 NOTE — Progress Notes (Signed)
Physical Therapy Treatment Patient Details Name: Stacey Dorsey MRN: [id number] DOB: Apr 27, 1933 Today's Date: 03/24/2022   History of Present Illness 88 y.o. female admitted 8/26 with respiratory distress and altered mental status. PMH:  ESRD on HD TTS, COPD, diabetes mellitus, hypertension, hyperlipidemia, chronic diastolic CHF    PT Comments    Pt continued to report dizziness with transfers and rolling.  Orthostatic blood pressure was stable when checked by RN just prior to PT.  O2 sats were stable on RA.  Pt did have some incoordination with smooth pursuit (kept falling behind or getting ahead of finger, jumping to catch up).  Also, had + symptoms but no nystagmus on hall pike dix on R side.  Performed Epley's maneuver for R posterior canal BPPV.  Pt reports feeling better with return to sitting. Provided HEP of Brandt-Dardoff exercise and seated habituation exercises.  Recommended outpt PT followup.   Recommendations for follow up therapy are one component of a multi-disciplinary discharge planning process, led by the attending physician.  Recommendations may be updated based on patient status, additional functional criteria and insurance authorization.  Follow Up Recommendations  Outpatient PT (vestibular)     Assistance Recommended at Discharge Set up Supervision/Assistance  Patient can return home with the following Assistance with cooking/housework;Assist for transportation;Help with stairs or ramp for entrance   Equipment Recommendations  None recommended by PT    Recommendations for Other Services       Precautions / Restrictions Precautions Precautions: Fall     Mobility  Bed Mobility Overal bed mobility: Independent                  Transfers Overall transfer level: Needs assistance Equipment used: Rolling walker (2 wheels) Transfers: Sit to/from Stand Sit to Stand: Supervision           General transfer comment: Performed x 3 during session, no LOB     Ambulation/Gait Ambulation/Gait assistance: Supervision Gait Distance (Feet): 200 Feet Assistive device: Rolling walker (2 wheels) Gait Pattern/deviations: Step-through pattern Gait velocity: decreased     General Gait Details: Mild decrease in speed; fatigued easily   Stairs             Wheelchair Mobility    Modified Rankin (Stroke Patients Only)       Balance Overall balance assessment: Needs assistance Sitting-balance support: No upper extremity supported, Feet supported Sitting balance-Leahy Scale: Normal     Standing balance support: No upper extremity supported, During functional activity Standing balance-Leahy Scale: Fair Standing balance comment: Took a few steps in room without RW                            Cognition Arousal/Alertness: Awake/alert Behavior During Therapy: WFL for tasks assessed/performed Overall Cognitive Status: No family/caregiver present to determine baseline cognitive functioning Area of Impairment: Memory                     Memory: Decreased short-term memory                  Exercises      General Comments General comments (skin integrity, edema, etc.):   VSS.  On RA sats 97% walking.  RN reports just checked orthostatic bp and was stable.  VESTIBULAR:  Pt reports some dizziness for 1-2 days now.  Described as swaying and occurs with rolling and initial standing.  States symptoms only last a few seconds to minutes. Does  not feel syncopal.    Smooth Pursuit: no dizziness or nystagmus, did demonstrate some incoordination with eyes catching up or falling behind finger Gaze Stabilization: intact, no dizziness Head Turns: mild dizziness Test of Skew: negative [facility] L: negative [address] R: + symptoms that lasted about 90 seconds but no nystagmus Performed Epley maneuver for R side.  Pt reports feeling better.      Pertinent Vitals/Pain Pain Assessment Pain Assessment: No/denies  pain    Home Living                          Prior Function            PT Goals (current goals can now be found in the care plan section) Progress towards PT goals: Progressing toward goals    Frequency    Min 3X/week      PT Plan Current plan remains appropriate    Co-evaluation              AM-PAC PT "6 Clicks" Mobility   Outcome Measure  Help needed turning from your back to your side while in a flat bed without using bedrails?: None Help needed moving from lying on your back to sitting on the side of a flat bed without using bedrails?: None Help needed moving to and from a bed to a chair (including a wheelchair)?: A Little Help needed standing up from a chair using your arms (e.g., wheelchair or bedside chair)?: A Little Help needed to walk in hospital room?: A Little Help needed climbing 3-5 steps with a railing? : A Little 6 Click Score: 20    End of Session Equipment Utilized During Treatment: Gait belt Activity Tolerance: Patient tolerated treatment well Patient left: with call bell/phone within reach;in bed;with bed alarm set Nurse Communication: Mobility status PT Visit Diagnosis: Muscle weakness (generalized) (M62.81);Unsteadiness on feet (R26.81)     Time: 1424-1450 PT Time Calculation (min) (ACUTE ONLY): 26 min  Charges:  $Gait Training: 8-22 mins $Canalith Rep Proc: 8-22 mins                    Abran Richard, PT Acute Rehab [facility] Rehab [phone]    Karlton Lemon 03/24/2022, 3:57 PM

## 2022-03-24 NOTE — Inpatient Diabetes Management (Signed)
Inpatient Diabetes Program Recommendations  AACE/ADA: New Consensus Statement on Inpatient Glycemic Control (2015)  Target Ranges:  Prepandial:   less than 140 mg/dL      Peak postprandial:   less than 180 mg/dL (1-2 hours)      Critically ill patients:  140 - 180 mg/dL   Lab Results  Component Value Date   GLUCAP 68 (L) 03/24/2022   HGBA1C 6.1 (H) 10/19/2021    Review of Glycemic Control  Latest Reference Range & Units 03/23/22 21:38 03/24/22 08:01 03/24/22 11:46  Glucose-Capillary 70 - 99 mg/dL 257 (H) 314 (H) Novolog 7 units 68 (L)  (H): Data is abnormally high (L): Data is abnormally low  Latest Reference Range & Units 03/24/22 03:21  GFR, Estimated >60 mL/min 4 (L)  (L): Data is abnormally low   Diabetes history: DM2 Outpatient Diabetes medications: None  Current orders for Inpatient glycemic control: Novolog 0-9 units TID and Prednisone 40 mg with breakfast  Inpatient Diabetes Program Recommendations:    Novolog 0-6 units TID and 0-5 units QHS  Will continue to follow while inpatient.  Thank you, Reche Dixon, MSN, [place] Diabetes Coordinator Inpatient Diabetes Program [phone] (team pager from 8a-5p)

## 2022-03-24 NOTE — TOC Transition Note (Signed)
Transition of Care [facility]) - CM/SW Discharge Note   Patient Details  Name: Stacey Dorsey MRN: [id number] Date of Birth: [birth year]  [facility] of [facility]) CM/SW Contact:  Cyndi Bender, RN Phone Number: 03/24/2022, 1:44 PM   Clinical Narrative:    Damaris Schooner to daughter, [name], regarding transition needs.[name] agreeable to OP Rehab and chose [facility] OP rehab. Referral sent. Patient has walker, cane and Rolator at home. Daughter concerned about transportation to OP Rehab. This RNCM notified her of patient's [facility] medicare transportation  benefit.    Address, Phone number and PCP verified.   Cyndi Bender, RN 03/24/2022 1:46 PM   Final next level of care: OP Rehab Barriers to Discharge: Barriers Resolved   Patient Goals and CMS Choice Patient states their goals for this hospitalization and ongoing recovery are:: return home CMS Medicare.gov Compare Post Acute Care list provided to:: Patient Represenative (must comment) Choice offered to / list presented to : Adult Children  Discharge Placement             home          Discharge Plan and [place] with OP Rehab                  Social Determinants of Health (SDOH) Interventions     Readmission Risk Interventions    03/24/2022    1:42 PM  Readmission Risk Prevention Plan  Transportation Screening Complete  Medication Review ([place]) Complete  PCP or Specialist appointment within 3-5 days of discharge Complete  HRI or [place] Complete  SW Recovery Care/Counseling Consult Complete  [place] Not Applicable

## 2022-03-24 NOTE — Progress Notes (Addendum)
Contacted attending staff to inquire about possible d/c date. Pt receives out-pt HD at [facility] on TTS. Will assist as needed.   Melven Sartorius Renal Navigator [phone]  Addendum at 1:40 pm: Advised by attending staff that pt is stable for d/c today. Contacted [facility] and spoke to [place]. Clinic advised pt will d/c today and resume care tomorrow.

## 2022-03-24 NOTE — Discharge Instructions (Signed)
Dear Stacey Dorsey,   Thank you for trusting [place] with your care. We treated you in the hospital for pneumonia. We started you on antibiotics. We would like you to take the antibiotic Keflex every 6 hours for the next two days. Please also take 40mg  of Prednisone daily with breakfast for the next two days. We have held your medication carvedilol (COREG). Please discuss this medication with your PCP at follow up.

## 2022-03-24 NOTE — Progress Notes (Signed)
OP HD: AF TTS 3.5h  400/500   60.4kg  2/2 bath  P2  LUA AVG   Hep 2000 - mircera 75 q2, last (will add) - doxercalciferol 5 mcg iv tiw - cinacalcet 30mg  po tiw - last outpt HD 8/22, came off at dry wt  Problem/Plan: Chronic hypoxic hypercapnic respiratory failure= affected pulmonary edema/pneumonia/underlying COPD aspiration playing a role HD yesterday on schedule 3 liters UF with symptoms resolved now ESRD -HD TTS, on schedule (next tx tomorrow). No absolute indication for RRT and the patient appears to be  comfortable.  History of COPD-noted admitted March this year acute hypercapnic respiratory failure treated with SoluMedro,l IV Mg, atrovent and bipap. Anemia of ESRD-Hgb 9.4 follow-up trend ESA 75 last given as outpt (will give Aranesp 100 qTues) Secondary hyperparathyroidism - cor Ca okay phosphorus 7.4 needed compliance with OP phosphorus binder continue Auryxia, Sensipar with dialysis and also vitamin D on dialysis History of prior CVA =was on DAPT reports not taking this.  See admit team plan  Subjective: Seen sitting up in bedside.  Denies shortness of breath, nausea, pain. Only complaint is cough with reddish sputum.  Objective Vital signs in last 24 hours: Vitals:   03/23/22 2149 03/23/22 2309 03/24/22 0328 03/24/22 0500  BP: (!) 182/39 (!) 177/58 (!) 173/61   Pulse:  61 (!) 59   Resp:  18 18   Temp:  98 F (36.7 C) 97.8 F (36.6 C)   TempSrc:  Oral Oral   SpO2:  98% 95%   Weight:    61 kg  Height:       Weight change: 2 kg  Physical Exam: General: Thin elderly female alert pleasant NAD , Brent 2 L Heart: RRR,2/6 SEM, no RG Lungs: Decreased BS at bases otherwise CTA nonlabored breathing on 2 L nasal cannula oxygen Abdomen: NABS soft NT ND no ascites Extremities: No pedal edema Dialysis Access: LUA AVG + bruit   Labs: Basic Metabolic Panel: Recent Labs  Lab 03/22/22 0756 03/22/22 0758 03/22/22 0814 03/22/22 1237 03/22/22 1300 03/23/22 0027 03/24/22 0321   NA 139 141   < > 138  --  138 136  K 4.5 4.4   < > 5.1  --  4.1 4.2  CL 104 104  --   --   --  97* 101  CO2 16*  --   --   --   --  23 24  GLUCOSE 246* 247*  --   --   --  261* 168*  BUN 70* 71*  --   --   --  42* 74*  CREATININE 10.15* 10.30*  --   --   --  6.47* 8.46*  CALCIUM 9.2  --   --   --   --  9.0 8.5*  PHOS  --   --   --   --  7.4*  --   --    < > = values in this interval not displayed.   Liver Function Tests: Recent Labs  Lab 03/22/22 0756  AST 26  ALT 22  ALKPHOS 86  BILITOT 0.3  PROT 6.8  ALBUMIN 3.9   No results for input(s): "LIPASE", "AMYLASE" in the last 168 hours. No results for input(s): "AMMONIA" in the last 168 hours. CBC: Recent Labs  Lab 03/22/22 0845 03/22/22 1237 03/23/22 0027 03/24/22 0321  WBC 14.3*  --  7.9 8.6  NEUTROABS 10.5*  --   --   --   HGB 10.2* 10.5* 9.4*  8.5*  HCT 32.9* 31.0* 29.0* 25.9*  MCV 94.5  --  90.3 88.4  PLT 200  --  195 179   Cardiac Enzymes: No results for input(s): "CKTOTAL", "CKMB", "CKMBINDEX", "TROPONINI" in the last 168 hours. CBG: Recent Labs  Lab 03/22/22 2201 03/23/22 0829 03/23/22 1244 03/23/22 1624 03/23/22 2138  GLUCAP 300* 120* 146* 191* 257*    Studies/Results: DG Chest 2 View  Result Date: 03/23/2022 CLINICAL DATA:  Follow-up pneumonia. EXAM: CHEST - 2 VIEW COMPARISON:  03/22/2022 FINDINGS: Mild cardiomegaly and ectasia of the thoracic aorta remains stable. Right perihilar and bilateral lower lung airspace disease shows significant improvement since previous study. Small right pleural effusion persists. Elevation of right hemidiaphragm again noted. No new or worsening areas of pulmonary opacity are seen. IMPRESSION: Significant improvement in bilateral airspace disease. Persistent small right pleural effusion. Electronically Signed   By: Marlaine Hind M.D.   On: 03/23/2022 17:58   DG Chest Portable 1 View  Result Date: 03/22/2022 CLINICAL DATA:  Shortness of breath.  Respiratory distress. EXAM:  PORTABLE CHEST 1 VIEW COMPARISON:  November 14, 2021 FINDINGS: New patchy opacity in the right mid lung is identified. The cardiomediastinal silhouette is stable. No pneumothorax. Mild opacity in left base is probably atelectasis. No overt edema. IMPRESSION: New infiltrate in the right mid lung worrisome for pneumonia. Recommend short-term follow-up imaging to ensure resolution. Electronically Signed   By: Dorise Bullion III M.D.   On: 03/22/2022 08:03   Medications:  azithromycin 500 mg (03/23/22 1258)   cefTRIAXone (ROCEPHIN)  IV 2 g (03/23/22 2142)    carvedilol  12.5 mg Oral BID WC   Chlorhexidine Gluconate Cloth  6 each Topical Q0600   cinacalcet  30 mg Oral Q T,Th,Sa-HD   cloNIDine  0.1 mg Oral BID   doxercalciferol  5 mcg Intravenous Q T,Th,Sa-HD   heparin  5,000 Units Subcutaneous Q8H   insulin aspart  0-9 Units Subcutaneous TID WC   mirtazapine  15 mg Oral QHS   mometasone-formoterol  2 puff Inhalation BID   NIFEdipine  60 mg Oral QHS   pantoprazole  40 mg Oral Daily   predniSONE  40 mg Oral Q breakfast   rOPINIRole  0.5 mg Oral Q2000   simvastatin  20 mg Oral QHS   traZODone  50 mg Oral QHS

## 2022-03-25 LAB — HEPATITIS B SURFACE ANTIBODY, QUANTITATIVE: Hep B S AB Quant (Post): 170.7 m[IU]/mL (ref >9.9)

## 2022-03-27 LAB — CULTURE, BLOOD (ROUTINE X 2)
Culture: NO GROWTH
Culture: NO GROWTH
Special Requests: ADEQUATE

## 2022-04-07 ENCOUNTER — Ambulatory Visit: Payer: Medicare Other | Attending: Internal Medicine | Admitting: Physical Therapy

## 2022-06-28 ENCOUNTER — Inpatient Hospital Stay (HOSPITAL_COMMUNITY)
Admission: EM | Admit: 2022-06-28 | Discharge: 2022-07-28 | DRG: 291 | Disposition: E | Payer: Medicare Other | Attending: Internal Medicine | Admitting: Internal Medicine

## 2022-06-28 ENCOUNTER — Emergency Department (HOSPITAL_COMMUNITY): Payer: Medicare Other

## 2022-06-28 DIAGNOSIS — I469 Cardiac arrest, cause unspecified: Principal | ICD-10-CM | POA: Diagnosis present

## 2022-06-28 DIAGNOSIS — G931 Anoxic brain damage, not elsewhere classified: Secondary | ICD-10-CM | POA: Diagnosis present

## 2022-06-28 DIAGNOSIS — I132 Hypertensive heart and chronic kidney disease with heart failure and with stage 5 chronic kidney disease, or end stage renal disease: Principal | ICD-10-CM | POA: Diagnosis present

## 2022-06-28 DIAGNOSIS — E1122 Type 2 diabetes mellitus with diabetic chronic kidney disease: Secondary | ICD-10-CM | POA: Diagnosis present

## 2022-06-28 DIAGNOSIS — E1165 Type 2 diabetes mellitus with hyperglycemia: Secondary | ICD-10-CM | POA: Diagnosis present

## 2022-06-28 DIAGNOSIS — J449 Chronic obstructive pulmonary disease, unspecified: Secondary | ICD-10-CM | POA: Diagnosis present

## 2022-06-28 DIAGNOSIS — I959 Hypotension, unspecified: Secondary | ICD-10-CM | POA: Diagnosis not present

## 2022-06-28 DIAGNOSIS — D631 Anemia in chronic kidney disease: Secondary | ICD-10-CM | POA: Diagnosis present

## 2022-06-28 DIAGNOSIS — Z992 Dependence on renal dialysis: Secondary | ICD-10-CM

## 2022-06-28 DIAGNOSIS — N186 End stage renal disease: Secondary | ICD-10-CM | POA: Diagnosis present

## 2022-06-28 DIAGNOSIS — Z515 Encounter for palliative care: Secondary | ICD-10-CM

## 2022-06-28 DIAGNOSIS — I5033 Acute on chronic diastolic (congestive) heart failure: Secondary | ICD-10-CM | POA: Diagnosis present

## 2022-06-28 DIAGNOSIS — G9341 Metabolic encephalopathy: Secondary | ICD-10-CM | POA: Diagnosis not present

## 2022-06-28 DIAGNOSIS — Z20822 Contact with and (suspected) exposure to covid-19: Secondary | ICD-10-CM | POA: Diagnosis present

## 2022-06-28 DIAGNOSIS — D696 Thrombocytopenia, unspecified: Secondary | ICD-10-CM | POA: Diagnosis present

## 2022-06-28 DIAGNOSIS — J9601 Acute respiratory failure with hypoxia: Secondary | ICD-10-CM | POA: Diagnosis present

## 2022-06-28 DIAGNOSIS — Z8673 Personal history of transient ischemic attack (TIA), and cerebral infarction without residual deficits: Secondary | ICD-10-CM | POA: Diagnosis not present

## 2022-06-28 DIAGNOSIS — E872 Acidosis, unspecified: Secondary | ICD-10-CM | POA: Diagnosis present

## 2022-06-28 DIAGNOSIS — Z66 Do not resuscitate: Secondary | ICD-10-CM | POA: Diagnosis present

## 2022-06-28 DIAGNOSIS — G40901 Epilepsy, unspecified, not intractable, with status epilepticus: Secondary | ICD-10-CM | POA: Diagnosis present

## 2022-06-28 DIAGNOSIS — Z9911 Dependence on respirator [ventilator] status: Secondary | ICD-10-CM | POA: Diagnosis not present

## 2022-06-28 DIAGNOSIS — G253 Myoclonus: Secondary | ICD-10-CM | POA: Diagnosis not present

## 2022-06-28 LAB — CREATININE, SERUM
Creatinine, Ser: 7.58 mg/dL — ABNORMAL HIGH (ref 0.44–1.00)
GFR, Estimated: 5 mL/min — ABNORMAL LOW (ref >60)

## 2022-06-28 LAB — I-STAT ARTERIAL BLOOD GAS, ED
Acid-base deficit: 6 mmol/L — ABNORMAL HIGH (ref 0.0–2.0)
Bicarbonate: 21.6 mmol/L (ref 20.0–28.0)
Calcium, Ion: 1.48 mmol/L — ABNORMAL HIGH (ref 1.15–1.40)
HCT: 33 % — ABNORMAL LOW (ref 36.0–46.0)
Hemoglobin: 11.2 g/dL — ABNORMAL LOW (ref 12.0–15.0)
O2 Saturation: 100 %
Patient temperature: 85.9
Potassium: 4.5 mmol/L (ref 3.5–5.1)
Sodium: 137 mmol/L (ref 135–145)
TCO2: 23 mmol/L (ref 22–32)
pCO2 arterial: 36.3 mmHg (ref 32–48)
pH, Arterial: 7.345 — ABNORMAL LOW (ref 7.35–7.45)
pO2, Arterial: 370 mmHg — ABNORMAL HIGH (ref 83–108)

## 2022-06-28 LAB — I-STAT VENOUS BLOOD GAS, ED
Acid-base deficit: 10 mmol/L — ABNORMAL HIGH (ref 0.0–2.0)
Bicarbonate: 21.6 mmol/L (ref 20.0–28.0)
Calcium, Ion: 1.45 mmol/L — ABNORMAL HIGH (ref 1.15–1.40)
HCT: 35 % — ABNORMAL LOW (ref 36.0–46.0)
Hemoglobin: 11.9 g/dL — ABNORMAL LOW (ref 12.0–15.0)
O2 Saturation: 78 %
Potassium: 4.1 mmol/L (ref 3.5–5.1)
Sodium: 137 mmol/L (ref 135–145)
TCO2: 24 mmol/L (ref 22–32)
pCO2, Ven: 77.9 mmHg (ref 44–60)
pH, Ven: 7.051 — CL (ref 7.25–7.43)
pO2, Ven: 62 mmHg — ABNORMAL HIGH (ref 32–45)

## 2022-06-28 LAB — CBC WITH DIFFERENTIAL/PLATELET
Abs Immature Granulocytes: 0.31 10*3/uL — ABNORMAL HIGH (ref 0.00–0.07)
Basophils Absolute: 0.1 10*3/uL (ref 0.0–0.1)
Basophils Relative: 1 %
Eosinophils Absolute: 0.1 10*3/uL (ref 0.0–0.5)
Eosinophils Relative: 1 %
HCT: 35.3 % — ABNORMAL LOW (ref 36.0–46.0)
Hemoglobin: 10.5 g/dL — ABNORMAL LOW (ref 12.0–15.0)
Immature Granulocytes: 3 %
Lymphocytes Relative: 65 %
Lymphs Abs: 6.8 10*3/uL — ABNORMAL HIGH (ref 0.7–4.0)
MCH: 28.7 pg (ref 26.0–34.0)
MCHC: 29.7 g/dL — ABNORMAL LOW (ref 30.0–36.0)
MCV: 96.4 fL (ref 80.0–100.0)
Monocytes Absolute: 0.2 10*3/uL (ref 0.1–1.0)
Monocytes Relative: 2 %
Neutro Abs: 2.9 10*3/uL (ref 1.7–7.7)
Neutrophils Relative %: 28 %
Platelets: 100 10*3/uL — ABNORMAL LOW (ref 150–400)
RBC: 3.66 MIL/uL — ABNORMAL LOW (ref 3.87–5.11)
RDW: 15.9 % — ABNORMAL HIGH (ref 11.5–15.5)
WBC: 10.5 10*3/uL (ref 4.0–10.5)
nRBC: 0.2 % (ref 0.0–0.2)

## 2022-06-28 LAB — SARS CORONAVIRUS 2 BY RT PCR: SARS Coronavirus 2 by RT PCR: NEGATIVE

## 2022-06-28 LAB — D-DIMER, QUANTITATIVE: D-Dimer, Quant: >20 ug/mL-FEU — ABNORMAL HIGH (ref 0.00–0.50)

## 2022-06-28 LAB — TROPONIN I (HIGH SENSITIVITY)
Troponin I (High Sensitivity): 1448 ng/L (ref <18)
Troponin I (High Sensitivity): 1596 ng/L (ref <18)

## 2022-06-28 LAB — COMPREHENSIVE METABOLIC PANEL
ALT: 301 U/L — ABNORMAL HIGH (ref 0–44)
AST: 358 U/L — ABNORMAL HIGH (ref 15–41)
Albumin: 2.9 g/dL — ABNORMAL LOW (ref 3.5–5.0)
Alkaline Phosphatase: 75 U/L (ref 38–126)
Anion gap: 16 — ABNORMAL HIGH (ref 5–15)
BUN: 35 mg/dL — ABNORMAL HIGH (ref 8–23)
CO2: 22 mmol/L (ref 22–32)
Calcium: 11.8 mg/dL — ABNORMAL HIGH (ref 8.9–10.3)
Chloride: 102 mmol/L (ref 98–111)
Creatinine, Ser: 7.66 mg/dL — ABNORMAL HIGH (ref 0.44–1.00)
GFR, Estimated: 5 mL/min — ABNORMAL LOW (ref >60)
Glucose, Bld: 301 mg/dL — ABNORMAL HIGH (ref 70–99)
Potassium: 4.1 mmol/L (ref 3.5–5.1)
Sodium: 140 mmol/L (ref 135–145)
Total Bilirubin: 0.3 mg/dL (ref 0.3–1.2)
Total Protein: 5.9 g/dL — ABNORMAL LOW (ref 6.5–8.1)

## 2022-06-28 LAB — I-STAT CHEM 8, ED
BUN: 49 mg/dL — ABNORMAL HIGH (ref 8–23)
Calcium, Ion: 1.45 mmol/L — ABNORMAL HIGH (ref 1.15–1.40)
Chloride: 105 mmol/L (ref 98–111)
Creatinine, Ser: 7.7 mg/dL — ABNORMAL HIGH (ref 0.44–1.00)
Glucose, Bld: 288 mg/dL — ABNORMAL HIGH (ref 70–99)
HCT: 35 % — ABNORMAL LOW (ref 36.0–46.0)
Hemoglobin: 11.9 g/dL — ABNORMAL LOW (ref 12.0–15.0)
Potassium: 4.1 mmol/L (ref 3.5–5.1)
Sodium: 138 mmol/L (ref 135–145)
TCO2: 25 mmol/L (ref 22–32)

## 2022-06-28 LAB — LACTIC ACID, PLASMA: Lactic Acid, Venous: 8.7 mmol/L (ref 0.5–1.9)

## 2022-06-28 LAB — CBG MONITORING, ED: Glucose-Capillary: 241 mg/dL — ABNORMAL HIGH (ref 70–99)

## 2022-06-28 LAB — POC OCCULT BLOOD, ED: Fecal Occult Bld: POSITIVE — AB

## 2022-06-28 MED ORDER — POLYETHYLENE GLYCOL 3350 17 G PO PACK: 17.0000 g | PACK | Freq: Every day | ORAL | Status: DC | PRN

## 2022-06-28 MED ORDER — FENTANYL CITRATE PF 50 MCG/ML IJ SOSY: 25.0000 ug | PREFILLED_SYRINGE | Freq: Once | INTRAMUSCULAR | Status: DC

## 2022-06-28 MED ORDER — DOCUSATE SODIUM 50 MG/5ML PO LIQD: 100.0000 mg | Freq: Two times a day (BID) | ORAL | Status: DC | PRN

## 2022-06-28 MED ORDER — DOCUSATE SODIUM 50 MG/5ML PO LIQD: 100.0000 mg | Freq: Two times a day (BID) | ORAL | Status: DC

## 2022-06-28 MED ORDER — IPRATROPIUM-ALBUTEROL 0.5-2.5 (3) MG/3ML IN SOLN
3.0000 mL | Freq: Four times a day (QID) | RESPIRATORY_TRACT | Status: DC
  Administered 2022-06-28 – 2022-06-30 (×7): 3 mL via RESPIRATORY_TRACT
  Filled 2022-06-28 (×6): qty 3

## 2022-06-28 MED ORDER — HEPARIN SODIUM (PORCINE) 5000 UNIT/ML IJ SOLN: 5000.0000 [IU] | Freq: Three times a day (TID) | INTRAMUSCULAR | Status: DC

## 2022-06-28 MED ORDER — FAMOTIDINE 20 MG PO TABS
10.0000 mg | ORAL_TABLET | Freq: Every day | ORAL | Status: DC
  Administered 2022-06-29: 10 mg
  Filled 2022-06-28: qty 1

## 2022-06-28 MED ORDER — POLYETHYLENE GLYCOL 3350 17 G PO PACK: 17.0000 g | PACK | Freq: Every day | ORAL | Status: DC

## 2022-06-28 MED ORDER — HYDRALAZINE HCL 20 MG/ML IJ SOLN
10.0000 mg | INTRAMUSCULAR | Status: DC | PRN
  Administered 2022-06-29: 20 mg via INTRAVENOUS
  Filled 2022-06-28: qty 1

## 2022-06-28 MED ORDER — FENTANYL 2500MCG IN NS 250ML (10MCG/ML) PREMIX INFUSION
25.0000 ug/h | INTRAVENOUS | Status: DC
  Administered 2022-06-28: 25 ug/h via INTRAVENOUS
  Filled 2022-06-28: qty 250

## 2022-06-28 MED ORDER — FENTANYL BOLUS VIA INFUSION: 25.0000 ug | INTRAVENOUS | Status: DC | PRN

## 2022-06-28 MED ORDER — CALCIUM GLUCONATE-NACL 1-0.675 GM/50ML-% IV SOLN: 1.0000 g | Freq: Once | INTRAVENOUS | Status: DC

## 2022-06-28 MED ORDER — IPRATROPIUM-ALBUTEROL 0.5-2.5 (3) MG/3ML IN SOLN: 3.0000 mL | Freq: Four times a day (QID) | RESPIRATORY_TRACT | Status: DC | PRN

## 2022-06-28 MED ORDER — INSULIN ASPART 100 UNIT/ML IJ SOLN
0.0000 [IU] | INTRAMUSCULAR | Status: DC
  Administered 2022-06-29: 2 [IU] via SUBCUTANEOUS

## 2022-06-28 MED ORDER — PROPOFOL 1000 MG/100ML IV EMUL
5.0000 ug/kg/min | INTRAVENOUS | Status: DC
  Administered 2022-06-28: 5 ug/kg/min via INTRAVENOUS
  Administered 2022-06-29: 50 ug/kg/min via INTRAVENOUS
  Administered 2022-06-29: 55 ug/kg/min via INTRAVENOUS
  Administered 2022-06-29: 50 ug/kg/min via INTRAVENOUS
  Administered 2022-06-30: 40 ug/kg/min via INTRAVENOUS
  Administered 2022-06-30: 30 ug/kg/min via INTRAVENOUS
  Filled 2022-06-28 (×3): qty 100
  Filled 2022-06-28: qty 200
  Filled 2022-06-28: qty 100

## 2022-06-28 MED ORDER — CALCIUM CHLORIDE 10 % IV SOLN
INTRAVENOUS | Status: AC | PRN
  Administered 2022-06-28: 1 g via INTRAVENOUS

## 2022-06-28 MED ORDER — EPINEPHRINE 1 MG/10ML IJ SOSY
PREFILLED_SYRINGE | INTRAMUSCULAR | Status: AC | PRN
  Administered 2022-06-28: 1 mg via INTRAVENOUS

## 2022-06-28 MED ORDER — CALCIUM GLUCONATE 10 % IV SOLN
INTRAVENOUS | Status: AC
  Administered 2022-06-28: 1 g
  Filled 2022-06-28: qty 10

## 2022-06-28 MED ORDER — SODIUM CHLORIDE 0.9 % IV SOLN: INTRAVENOUS | Status: DC | PRN

## 2022-06-28 NOTE — Procedures (Signed)
Arterial Catheter Insertion Procedure Note  Stacey Dorsey  [id number]  July 06, 1933  Date:06/28/22  Time:9:11 PM    Provider Performing: Morley Kos    Procedure: Insertion of Arterial Line (26203) without US guidance  Indication(s) Blood pressure monitoring and/or need for frequent ABGs  Consent Unable to obtain consent due to emergent nature of procedure.  Anesthesia None   Time Out Verified patient identification, verified procedure, site/side was marked, verified correct patient position, special equipment/implants available, medications/allergies/relevant history reviewed, required imaging and test results available.   Sterile Technique Maximal sterile technique including full sterile barrier drape, hand hygiene, sterile gown, sterile gloves, mask, hair covering, sterile ultrasound probe cover (if used).   Procedure Description Area of catheter insertion was cleaned with chlorhexidine and draped in sterile fashion. With real-time ultrasound guidance an arterial catheter was placed into the right radial artery.  Appropriate arterial tracings confirmed on monitor.     Complications/Tolerance None; patient tolerated the procedure well.   EBL Minimal   Specimen(s) None

## 2022-06-28 NOTE — ED Provider Notes (Incomplete)
Patient is an 88 year old female with a history of end-stage renal disease on dialysis who did not go to dialysis today presenting today with cardiac arrest.  Friend who was present with the patient reported that she complained of shortness of breath and went to get her inhaler.  She then sat down and slumped over.  When EMS arrived patient was agonal he breathing but appeared to be in PEA.  CPR was started initially with less than 5-minute downtime that they postulate.  She received multiple rounds of epi did have some breathing and gagging when the [facility] airway was placed had ROSC and then lost pulses approximately 8 minutes prior to arrival to the ED.  Upon arrival here patient was receiving compressions after 1 round of epi, calcium, bicarb patient had return of spontaneous circulation.  She was intubated without difficulty with some frothy sputum and bilateral rhonchi.  Blood pressure has been normal and she has been hemodynamically stable after airway was secured.  Concern for infectious etiology versus PE versus dysrhythmia versus electrolyte abnormality.  Patient's potassium is normal 4.1, BUN and creatinine are elevated consistent with end-stage renal disease.  Patient's first gas showed a respiratory acidosis however after being on the ventilator repeat gas was improved with a pH of 7.34 and CO2 of 36, troponin and lactic acid are elevated but hemoglobin is stable at 10, LFTs are also elevated.  Patient is not requiring any pressors.  Nephrology was consulted for dialysis, she will be admitted to the ICU.  Patient has been trying to grab the tube and was placed in soft restraints.  Based on family reports she was a full code discussed with the ICU for admission.  CRITICAL CARE Performed by: Makel Mcmann Total critical care time: 30 minutes Critical care time was exclusive of separately billable procedures and treating other patients. Critical care was necessary to treat or prevent imminent or  life-threatening deterioration. Critical care was time spent personally by me on the following activities: development of treatment plan with patient and/or surrogate as well as nursing, discussions with consultants, evaluation of patient's response to treatment, examination of patient, obtaining history from patient or surrogate, ordering and performing treatments and interventions, ordering and review of laboratory studies, ordering and review of radiographic studies, pulse oximetry and re-evaluation of patient's condition.

## 2022-06-28 NOTE — ED Notes (Signed)
Pt had large amount of liquid stool, dark in color. Hemoccult test performed + results. Provider notified

## 2022-06-28 NOTE — H&P (Signed)
NAME:  Stacey Dorsey, MRN:  [id number], DOB:  06-25-1933, LOS: 0 ADMISSION DATE:  06/28/2022, CONSULTATION DATE:  06/28/22 REFERRING MD:  EDP, CHIEF COMPLAINT:  cardiac arrest   History of Present Illness:  88 yo esrd (TTS), DM2, HTN, HFpEF, Ao stenosis, COPD, prior L MCA CVA who presented via ems after friend called when pt was suddenly sob and gasping for air. Pt reportedly missed dialysis today as well.   History is obtained from the limited chart as pt is intubated and unresponsive (off sedation) at this time and no family reachable. Per EMS report pt was visiting with friend and suddenly had sob, her friend went to get the patients inhaler and when she retrurned the patient was slumped over in chair. EMS arrived and cpr initiated. She was given epi and rosc achieved but she had second arrest shortly after, given 6 epi and 1 bicarb, cpr was still ongoing upon arrival.   At the time of my examination she is hypertensive with sbp >220 intubated, not on infusions. She is unresponsive to painful stim. No gag, pupils pinpoint and equal but unable to appreciate any reactivity. Her lactate is is elevated at >8, trop elevated, electrolytes all within range despite the missing of dilaysis today. Her cxr shows pulmonary edema and she is wheezing bilaterally.    Pertinent  Medical History  ESRD HTN HFpEF Ao stenosis COPD Prior L MCA Significant Hospital Events: Including procedures, antibiotic start and stop dates in addition to other pertinent events   Cardiac arrest out of hospital 06/28/22 Admitted to hospital 12/2  Interim History / Subjective:    Objective   Blood pressure (!) 131/49, pulse 69, resp. rate (!) 23, height 5\' 6"  (1.676 m), weight 59 kg, SpO2 97 %.    Vent Mode: PRVC FiO2 (%):  [50 %-100 %] 50 % Set Rate:  [24 bmp] 24 bmp Vt Set:  [470 mL] 470 mL PEEP:  [5 cmH20] 5 cmH20 Plateau Pressure:  [17 cmH20] 17 cmH20  No intake or output data in the 24 hours ending 06/28/22  2144 Filed Weights   06/28/22 2057  Weight: 59 kg    Examination: General: unresponsive on vent HENT: ncat, pupils are equal but pinpoint and difficult to appreciate reactivity. Arcus senilis  Lungs: wheezing bilaterally Cardiovascular: sinus with pvc Abdomen: soft non distended, well healed midline scar Extremities: no c/c/e Neuro: unresponsive to painful stim. No purposeful movement.  GU: foley in place  Resolved Hospital Problem list     Assessment & Plan:  [place] s/p rosc -suspect 2/2 bronchospasm vs flash pulmonary edema with htn and missed dialysis -not req pressors at this time infact hypertensive.  -prn anti-htn  -echo ordered.  -le dopplers ? Pe playing a role but unable to do a ctpa with renal dysfunction Htn:  -prn agents -resume home meds once chart has been merged and meds verified Hfpef with acute exacerbation:  -suspect this is a large contributing factor to her arrest -follow uop, unclear how much she makes at [facility] -dialysis in am.  Lactic acidosis:  -trend, judicious volume to clear  Acute hypoxic resp failure:  Pulm edema -titrate vent -prn bronchodilators -will dose steroids x1 for now. If improves wheezing will continue but otherwise perhaps just volume overload is cause.   Esrd"  -missed session -nephro consult in am.   T2dm:  -ssi   Best Practice (right click and "Reselect all SmartList Selections" daily)   Diet/type: NPO DVT prophylaxis: systemic heparin GI prophylaxis: H2B  Lines: Arterial Line Foley:  Yes, and it is still needed Code Status:  full code Last date of multidisciplinary goals of care discussion [pending discussion with family]  Labs   CBC: Recent Labs  Lab 06/28/22 2026 06/28/22 2035 06/28/22 2036 06/28/22 2106  WBC 10.5  --   --   --   NEUTROABS 2.9  --   --   --   HGB 10.5* 11.9* 11.9* 11.2*  HCT 35.3* 35.0* 35.0* 33.0*  MCV 96.4  --   --   --   PLT 100*  --   --   --     Basic Metabolic  Panel: Recent Labs  Lab 06/28/22 2035 06/28/22 2036 06/28/22 2106  NA 138 137 137  K 4.1 4.1 4.5  CL 105  --   --   GLUCOSE 288*  --   --   BUN 49*  --   --   CREATININE 7.70*  --   --    GFR: Estimated Creatinine Clearance: 4.8 mL/min (A) (by C-G formula based on SCr of 7.7 mg/dL (H)). Recent Labs  Lab 06/28/22 2026  WBC 10.5    Liver Function Tests: No results for input(s): "AST", "ALT", "ALKPHOS", "BILITOT", "PROT", "ALBUMIN" in the last 168 hours. No results for input(s): "LIPASE", "AMYLASE" in the last 168 hours. No results for input(s): "AMMONIA" in the last 168 hours.  ABG    Component Value Date/Time   PHART 7.345 (L) 06/28/2022 2106   PCO2ART 36.3 06/28/2022 2106   PO2ART 370 (H) 06/28/2022 2106   HCO3 21.6 06/28/2022 2106   TCO2 23 06/28/2022 2106   ACIDBASEDEF 6.0 (H) 06/28/2022 2106   O2SAT 100 06/28/2022 2106     Coagulation Profile: No results for input(s): "INR", "PROTIME" in the last 168 hours.  Cardiac Enzymes: No results for input(s): "CKTOTAL", "CKMB", "CKMBINDEX", "TROPONINI" in the last 168 hours.  HbA1C: No results found for: "HGBA1C"  CBG: Recent Labs  Lab 06/28/22 2035  GLUCAP 241*    Review of Systems:   Unobtainable 2/2 intubated status  Past Medical History:  She,  has no past medical history on file.   Surgical History:  unobtainable   Social History:      Family History:  Her family history is not on file.   Allergies Not on File   Home Medications  Prior to Admission medications   Not on File     Critical care time: 41 min excluding procedures.

## 2022-06-28 NOTE — ED Provider Notes (Signed)
[facility] EMERGENCY DEPARTMENT Provider Note   CSN: [id number] Arrival date & time:        History  Chief Complaint  Patient presents with   CPR    Stacey Dorsey is a 88 y.o. female.  HPI The patient is an 88 year old female with reported history of ESRD on HD with missed sessions presenting by EMS as a out of hospital cardiac arrest.  Per EMS, they were called to scene after the patient became unresponsive.  They initiated CPR on their arrival at 1917 and obtained ROSC after approximately 30 minutes of CPR.  The patient maintained pulses for approximately 10 minutes before CPR was initiated again.  During transport, EMS gave 5 rounds of epi, 1 g of calcium, and 1 amp of bicarb.  On arrival, the patient had been undergoing CPR for approximately 20 minutes since CPR was reinitiated.     Home Medications Prior to Admission medications   Not on File      Allergies    Patient has no allergy information on record.    Review of Systems   Review of Systems  See HPI  Physical Exam Updated Vital Signs BP (!) 160/75   Pulse 69   Resp 12   Ht 5\' 6"  (1.676 m)   Wt 59 kg   SpO2 94%   BMI 20.98 kg/m  Physical Exam Vitals and nursing note reviewed.  Constitutional:      General: She is not in acute distress.    Appearance: She is well-developed.     Comments: GCS 3t  HENT:     Head: Normocephalic and atraumatic.  Eyes:     Conjunctiva/sclera: Conjunctivae normal.  Cardiovascular:     Comments: CPR in progress with device on arrival. Pulmonary:     Comments: King airway in place with bag-valve-mask ventilation. Skin:    General: Skin is warm and dry.     Capillary Refill: Capillary refill takes less than 2 seconds.  Neurological:     Comments: GCS 3  Psychiatric:        Mood and Affect: Mood normal.     ED Results / Procedures / Treatments   Labs (all labs ordered are listed, but only abnormal results are displayed) Labs Reviewed  CBC  WITH DIFFERENTIAL/PLATELET - Abnormal; Notable for the following components:      Result Value   RBC 3.66 (*)    Hemoglobin 10.5 (*)    HCT 35.3 (*)    MCHC 29.7 (*)    RDW 15.9 (*)    Platelets 100 (*)    Lymphs Abs 6.8 (*)    Abs Immature Granulocytes 0.31 (*)    All other components within normal limits  COMPREHENSIVE METABOLIC PANEL - Abnormal; Notable for the following components:   Glucose, Bld 301 (*)    BUN 35 (*)    Creatinine, Ser 7.66 (*)    Calcium 11.8 (*)    Total Protein 5.9 (*)    Albumin 2.9 (*)    AST 358 (*)    ALT 301 (*)    GFR, Estimated 5 (*)    Anion gap 16 (*)    All other components within normal limits  LACTIC ACID, PLASMA - Abnormal; Notable for the following components:   Lactic Acid, Venous 8.7 (*)    All other components within normal limits  D-DIMER, QUANTITATIVE - Abnormal; Notable for the following components:   D-Dimer, Quant >20.00 (*)    All other  components within normal limits  CREATININE, SERUM - Abnormal; Notable for the following components:   Creatinine, Ser 7.58 (*)    GFR, Estimated 5 (*)    All other components within normal limits  I-STAT ARTERIAL BLOOD GAS, ED - Abnormal; Notable for the following components:   pH, Arterial 7.345 (*)    pO2, Arterial 370 (*)    Acid-base deficit 6.0 (*)    Calcium, Ion 1.48 (*)    HCT 33.0 (*)    Hemoglobin 11.2 (*)    All other components within normal limits  I-STAT CHEM 8, ED - Abnormal; Notable for the following components:   BUN 49 (*)    Creatinine, Ser 7.70 (*)    Glucose, Bld 288 (*)    Calcium, Ion 1.45 (*)    Hemoglobin 11.9 (*)    HCT 35.0 (*)    All other components within normal limits  CBG MONITORING, ED - Abnormal; Notable for the following components:   Glucose-Capillary 241 (*)    All other components within normal limits  I-STAT VENOUS BLOOD GAS, ED - Abnormal; Notable for the following components:   pH, Ven 7.051 (*)    pCO2, Ven 77.9 (*)    pO2, Ven 62 (*)     Acid-base deficit 10.0 (*)    Calcium, Ion 1.45 (*)    HCT 35.0 (*)    Hemoglobin 11.9 (*)    All other components within normal limits  POC OCCULT BLOOD, ED - Abnormal; Notable for the following components:   Fecal Occult Bld POSITIVE (*)    All other components within normal limits  TROPONIN I (HIGH SENSITIVITY) - Abnormal; Notable for the following components:   Troponin I (High Sensitivity) 1,448 (*)    All other components within normal limits  TROPONIN I (HIGH SENSITIVITY) - Abnormal; Notable for the following components:   Troponin I (High Sensitivity) 1,596 (*)    All other components within normal limits  SARS CORONAVIRUS 2 BY RT PCR  CULTURE, BLOOD (ROUTINE X 2)  CULTURE, BLOOD (ROUTINE X 2)  CULTURE, RESPIRATORY W GRAM STAIN  LACTIC ACID, PLASMA  LACTIC ACID, PLASMA  LACTIC ACID, PLASMA  CBC  BASIC METABOLIC PANEL  MAGNESIUM  PHOSPHORUS  TRIGLYCERIDES  HEMOGLOBIN A1C  TYPE AND SCREEN  ABO/RH  TYPE AND SCREEN    EKG EKG Interpretation  Date/Time:  Saturday June 28 2022 20:22:30 EST Ventricular Rate:  85 PR Interval:    QRS Duration: 139 QT Interval:  457 QTC Calculation: 544 R Axis:   -7 Text Interpretation: Atrial fibrillation Ventricular premature complex Left bundle branch block Confirmed by Blanchie Dessert [phone]) on 06/28/2022 8:26:48 PM  Radiology DG Chest Portable 1 View  Result Date: 06/28/2022 CLINICAL DATA:  Post intubation EXAM: PORTABLE CHEST 1 VIEW COMPARISON:  None Available. FINDINGS: Endotracheal tube tip is 9 mm above the carina. The heart is enlarged there central pulmonary vascular congestion. There is some patchy airspace opacities in the right perihilar region. No pleural effusion. No definite pneumothorax or acute fracture. IMPRESSION: 1. Endotracheal tube tip is 9 mm above the carina. Retraction by 2 cm is recommended. 2. Cardiomegaly with central pulmonary vascular congestion. 3. Patchy airspace opacities in the right perihilar  region. Electronically Signed   By: Ronney Asters M.D.   On: 06/28/2022 20:42    Procedures Procedure Name: Intubation Date/Time: 06/28/2022 10:15 PM  Performed by: Dani Gobble, MDPre-anesthesia Checklist: Patient identified, Emergency Drugs available, Suction available, Patient being monitored and Timeout performed  Oxygen Delivery Method: Ambu bag Preoxygenation: Pre-oxygenation with 100% oxygen Ventilation: Mask ventilation without difficulty Laryngoscope Size: Glidescope and 3 Grade View: Grade II Tube size: 7.5 mm Number of attempts: 1 Airway Equipment and Method: Video-laryngoscopy and Rigid stylet Placement Confirmation: ETT inserted through vocal cords under direct vision, Positive ETCO2, CO2 detector and Breath sounds checked- equal and bilateral Secured at: 23 cm Tube secured with: ETT holder        Medications Ordered in ED Medications  0.9 %  sodium chloride infusion (has no administration in time range)  docusate (COLACE) 50 MG/5ML liquid 100 mg (has no administration in time range)  polyethylene glycol (MIRALAX / GLYCOLAX) packet 17 g (has no administration in time range)  heparin injection 5,000 Units (has no administration in time range)  famotidine (PEPCID) tablet 10 mg (has no administration in time range)  ipratropium-albuterol (DUONEB) 0.5-2.5 (3) MG/3ML nebulizer solution 3 mL (3 mLs Nebulization Given 06/28/22 2205)  ipratropium-albuterol (DUONEB) 0.5-2.5 (3) MG/3ML nebulizer solution 3 mL (has no administration in time range)  hydrALAZINE (APRESOLINE) injection 10-40 mg (has no administration in time range)  docusate (COLACE) 50 MG/5ML liquid 100 mg (has no administration in time range)  polyethylene glycol (MIRALAX / GLYCOLAX) packet 17 g (has no administration in time range)  fentaNYL (SUBLIMAZE) injection 25 mcg (has no administration in time range)  fentaNYL 2551mcg in NS 266mL (8mcg/ml) infusion-PREMIX (25 mcg/hr Intravenous New Bag/Given 06/28/22 2319)   fentaNYL (SUBLIMAZE) bolus via infusion 25-100 mcg (has no administration in time range)  propofol (DIPRIVAN) 1000 MG/100ML infusion (5 mcg/kg/min  59 kg Intravenous New Bag/Given 06/28/22 2318)  insulin aspart (novoLOG) injection 0-6 Units (has no administration in time range)  calcium gluconate 10 % injection (1 g  Given by Other 06/28/22 2036)  EPINEPHrine (ADRENALIN) 1 MG/10ML injection (1 mg Intravenous Given 06/28/22 2010)  calcium chloride injection (1 g Intravenous Given 06/28/22 2011)    ED Course/ Medical Decision Making/ A&P                           Medical Decision Making  The patient is an 88 year old female with reported history of ESRD on HD with missed sessions presenting by EMS as a out of hospital cardiac arrest with initial rhythm of PEA.  The differential diagnosis considered includes: Electrolyte abnormality, pulmonary embolism, ACS, volume overload, acute hypoxic respiratory failure, trauma, sepsis.  On arrival, the patient was receiving CPR with a [place] device.  She received 1 mg of code dose epi and a gram of calcium gluconate and an additional gram of calcium chloride.  At the 2-minute pulse check the [facility] device was paused and the patient was found to have femoral pulses.  CPR was discontinued.  The patient's Edison Pace airway was replaced with an endotracheal tube in accordance with the procedure section of the note above.  I independently reviewed all patient's lab work, EKGs, and imaging involved in her workup.  The patient received a chest x-ray which showed cardiomegaly with central pulmonary vascular congestion and patchy airspace opacities in the perihilar region.  She received several EKGs which showed an organized rhythm which significantly improved after receiving calcium.  The patient's lab workup included a VBG on arrival which showed a pH of 7.051 with pCO2 of 77.9 and hemoglobin of 11.9.  An arterial line was placed and patient's ABG showed a pH of 7.345 after  intubation with calcium 1.48, and hemoglobin of 11.2.  Patient received  a point-of-care glucose which was elevated 240; i-STAT Chem-8 with BUN elevated to 49, glucose 288, calcium 1.45; D-dimer elevated to greater than 20; initial troponin elevated to 1448; CMP with glucose 301, BUN 35, potassium 4.1, calcium 11.8, AST 358 and ALT of 301, with anion gap of 16; the patient's CBC showed a white blood cell count of 10.5 and hemoglobin 10.5.  After intubation, patient was noted to be reaching for her endotracheal tube and required soft wrist restraints.  The patient was admitted to the ICU for cardiac arrest of unknown etiology.  Amount and/or Complexity of Data Reviewed Independent Historian: EMS Labs: ordered. Decision-making details documented in ED Course. Radiology: ordered and independent interpretation performed. Decision-making details documented in ED Course. ECG/medicine tests: ordered and independent interpretation performed. Decision-making details documented in ED Course. Discussion of management or test interpretation with external provider(s): Intensivist  Risk Prescription drug management. Decision regarding hospitalization.   Patient's presentation is most consistent with acute presentation with potential threat to life or bodily function.         Final Clinical Impression(s) / ED Diagnoses Final diagnoses:  Cardiac arrest [facility])    Rx / [place] Orders ED Discharge Orders     None         Dani Gobble, MD 06/28/22 [id number]    Blanchie Dessert, MD 06/29/22 1358

## 2022-06-28 NOTE — ED Notes (Signed)
Blood Glucose 241

## 2022-06-28 NOTE — ED Triage Notes (Addendum)
Pt in from home via [facility], called out by friend who had just talked to her in person normally 5 min prior. Pt with hx of dialysis, missed today's session. Friend states pt was c/o sob, and the friend went to go get an inhaler. By the time she returned, pt was slouched and unresponsive in her chair. CPR started at 1917, ROSC gained, then 2nd CPR at 1954. Given 6 epi's, 1 amp of bicarb and Ca enroute. Arrives with king airway, CPR still in progress

## 2022-06-29 ENCOUNTER — Other Ambulatory Visit (HOSPITAL_COMMUNITY): Payer: Medicare Other

## 2022-06-29 ENCOUNTER — Encounter (HOSPITAL_COMMUNITY): Payer: Medicare Other

## 2022-06-29 ENCOUNTER — Inpatient Hospital Stay (HOSPITAL_COMMUNITY): Payer: Medicare Other

## 2022-06-29 DIAGNOSIS — I469 Cardiac arrest, cause unspecified: Secondary | ICD-10-CM | POA: Diagnosis not present

## 2022-06-29 DIAGNOSIS — G9341 Metabolic encephalopathy: Secondary | ICD-10-CM | POA: Diagnosis not present

## 2022-06-29 DIAGNOSIS — G253 Myoclonus: Secondary | ICD-10-CM | POA: Diagnosis not present

## 2022-06-29 DIAGNOSIS — J9601 Acute respiratory failure with hypoxia: Secondary | ICD-10-CM

## 2022-06-29 DIAGNOSIS — Z9911 Dependence on respirator [ventilator] status: Secondary | ICD-10-CM

## 2022-06-29 LAB — GLUCOSE, CAPILLARY
Glucose-Capillary: 110 mg/dL — ABNORMAL HIGH (ref 70–99)
Glucose-Capillary: 129 mg/dL — ABNORMAL HIGH (ref 70–99)
Glucose-Capillary: 140 mg/dL — ABNORMAL HIGH (ref 70–99)
Glucose-Capillary: 148 mg/dL — ABNORMAL HIGH (ref 70–99)
Glucose-Capillary: 190 mg/dL — ABNORMAL HIGH (ref 70–99)
Glucose-Capillary: 50 mg/dL — ABNORMAL LOW (ref 70–99)
Glucose-Capillary: 76 mg/dL (ref 70–99)

## 2022-06-29 LAB — POCT I-STAT 7, (LYTES, BLD GAS, ICA,H+H)
Acid-base deficit: 2 mmol/L (ref 0.0–2.0)
Bicarbonate: 19.2 mmol/L — ABNORMAL LOW (ref 20.0–28.0)
Calcium, Ion: 1.29 mmol/L (ref 1.15–1.40)
HCT: 32 % — ABNORMAL LOW (ref 36.0–46.0)
Hemoglobin: 10.9 g/dL — ABNORMAL LOW (ref 12.0–15.0)
O2 Saturation: 100 %
Patient temperature: 35.9
Potassium: 4.7 mmol/L (ref 3.5–5.1)
Sodium: 139 mmol/L (ref 135–145)
TCO2: 20 mmol/L — ABNORMAL LOW (ref 22–32)
pCO2 arterial: 21.1 mmHg — ABNORMAL LOW (ref 32–48)
pH, Arterial: 7.564 — ABNORMAL HIGH (ref 7.35–7.45)
pO2, Arterial: 229 mmHg — ABNORMAL HIGH (ref 83–108)

## 2022-06-29 LAB — CBC
HCT: 30.4 % — ABNORMAL LOW (ref 36.0–46.0)
Hemoglobin: 10.1 g/dL — ABNORMAL LOW (ref 12.0–15.0)
MCH: 28.6 pg (ref 26.0–34.0)
MCHC: 33.2 g/dL (ref 30.0–36.0)
MCV: 86.1 fL (ref 80.0–100.0)
Platelets: 144 10*3/uL — ABNORMAL LOW (ref 150–400)
RBC: 3.53 MIL/uL — ABNORMAL LOW (ref 3.87–5.11)
RDW: 15.7 % — ABNORMAL HIGH (ref 11.5–15.5)
WBC: 14.5 10*3/uL — ABNORMAL HIGH (ref 4.0–10.5)
nRBC: 0 % (ref 0.0–0.2)

## 2022-06-29 LAB — MAGNESIUM: Magnesium: 2.2 mg/dL (ref 1.7–2.4)

## 2022-06-29 LAB — LACTIC ACID, PLASMA
Lactic Acid, Venous: 3.2 mmol/L (ref 0.5–1.9)
Lactic Acid, Venous: 3.5 mmol/L (ref 0.5–1.9)

## 2022-06-29 LAB — ABO/RH: ABO/RH(D): A POS

## 2022-06-29 LAB — BASIC METABOLIC PANEL
Anion gap: 15 (ref 5–15)
BUN: 46 mg/dL — ABNORMAL HIGH (ref 8–23)
CO2: 22 mmol/L (ref 22–32)
Calcium: 10.3 mg/dL (ref 8.9–10.3)
Chloride: 104 mmol/L (ref 98–111)
Creatinine, Ser: 7.83 mg/dL — ABNORMAL HIGH (ref 0.44–1.00)
GFR, Estimated: 5 mL/min — ABNORMAL LOW (ref >60)
Glucose, Bld: 123 mg/dL — ABNORMAL HIGH (ref 70–99)
Potassium: 4.2 mmol/L (ref 3.5–5.1)
Sodium: 141 mmol/L (ref 135–145)

## 2022-06-29 LAB — PHOSPHORUS: Phosphorus: 6 mg/dL — ABNORMAL HIGH (ref 2.5–4.6)

## 2022-06-29 LAB — TRIGLYCERIDES: Triglycerides: 82 mg/dL (ref <150)

## 2022-06-29 LAB — MRSA NEXT GEN BY PCR, NASAL: MRSA by PCR Next Gen: NOT DETECTED

## 2022-06-29 MED ORDER — POLYVINYL ALCOHOL 1.4 % OP SOLN: 1.0000 [drp] | Freq: Four times a day (QID) | OPHTHALMIC | Status: DC | PRN

## 2022-06-29 MED ORDER — FENTANYL 2500MCG IN NS 250ML (10MCG/ML) PREMIX INFUSION: 0.0000 ug/h | INTRAVENOUS | Status: DC

## 2022-06-29 MED ORDER — GLYCOPYRROLATE 0.2 MG/ML IJ SOLN
0.2000 mg | INTRAMUSCULAR | Status: DC | PRN
  Filled 2022-06-29: qty 1

## 2022-06-29 MED ORDER — MIDAZOLAM-SODIUM CHLORIDE 100-0.9 MG/100ML-% IV SOLN
0.0000 mg/h | INTRAVENOUS | Status: DC
  Administered 2022-06-29: 1 mg/h via INTRAVENOUS
  Administered 2022-06-30: 2 mg/h via INTRAVENOUS
  Filled 2022-06-29: qty 100

## 2022-06-29 MED ORDER — GLYCOPYRROLATE 0.2 MG/ML IJ SOLN
0.2000 mg | INTRAMUSCULAR | Status: DC | PRN
  Administered 2022-06-30 (×2): 0.2 mg via INTRAVENOUS
  Filled 2022-06-29: qty 1

## 2022-06-29 MED ORDER — CHLORHEXIDINE GLUCONATE CLOTH 2 % EX PADS
6.0000 | MEDICATED_PAD | Freq: Every day | CUTANEOUS | Status: DC
  Administered 2022-06-29: 6 via TOPICAL

## 2022-06-29 MED ORDER — MIDAZOLAM HCL 2 MG/2ML IJ SOLN: 1.0000 mg | INTRAMUSCULAR | Status: DC | PRN

## 2022-06-29 MED ORDER — ACETAMINOPHEN 325 MG PO TABS: 650.0000 mg | ORAL_TABLET | Freq: Four times a day (QID) | ORAL | Status: DC | PRN

## 2022-06-29 MED ORDER — NOREPINEPHRINE 4 MG/250ML-% IV SOLN
2.0000 ug/min | INTRAVENOUS | Status: DC
  Administered 2022-06-29: 2 ug/min via INTRAVENOUS

## 2022-06-29 MED ORDER — ORAL CARE MOUTH RINSE: 15.0000 mL | OROMUCOSAL | Status: DC | PRN

## 2022-06-29 MED ORDER — DEXTROSE 5 % IV SOLN: INTRAVENOUS | Status: DC

## 2022-06-29 MED ORDER — FENTANYL BOLUS VIA INFUSION: 100.0000 ug | INTRAVENOUS | Status: DC | PRN

## 2022-06-29 MED ORDER — MIDAZOLAM BOLUS VIA INFUSION (WITHDRAWAL LIFE SUSTAINING TX): 2.0000 mg | INTRAVENOUS | Status: DC | PRN

## 2022-06-29 MED ORDER — DIPHENHYDRAMINE HCL 50 MG/ML IJ SOLN: 25.0000 mg | INTRAMUSCULAR | Status: DC | PRN

## 2022-06-29 MED ORDER — SODIUM CHLORIDE 0.9 % IV SOLN: 250.0000 mL | INTRAVENOUS | Status: DC

## 2022-06-29 MED ORDER — DEXTROSE 50 % IV SOLN
25.0000 g | INTRAVENOUS | Status: AC
  Administered 2022-06-29: 25 g via INTRAVENOUS
  Filled 2022-06-29: qty 50

## 2022-06-29 MED ORDER — SODIUM CHLORIDE 0.9 % IV SOLN
2.0000 g | INTRAVENOUS | Status: DC
  Administered 2022-06-29 – 2022-06-30 (×2): 2 g via INTRAVENOUS
  Filled 2022-06-29 (×2): qty 20

## 2022-06-29 MED ORDER — ATROPINE SULFATE 1 MG/10ML IJ SOSY
PREFILLED_SYRINGE | INTRAMUSCULAR | Status: AC
  Filled 2022-06-29: qty 10

## 2022-06-29 MED ORDER — GLYCOPYRROLATE 1 MG PO TABS: 1.0000 mg | ORAL_TABLET | ORAL | Status: DC | PRN

## 2022-06-29 MED ORDER — NOREPINEPHRINE 4 MG/250ML-% IV SOLN
INTRAVENOUS | Status: AC
  Filled 2022-06-29: qty 250

## 2022-06-29 MED ORDER — ORAL CARE MOUTH RINSE
15.0000 mL | OROMUCOSAL | Status: DC
  Administered 2022-06-29 – 2022-06-30 (×14): 15 mL via OROMUCOSAL

## 2022-06-29 MED ORDER — EPINEPHRINE 1 MG/10ML IJ SOSY
PREFILLED_SYRINGE | INTRAMUSCULAR | Status: AC
  Filled 2022-06-29: qty 10

## 2022-06-29 MED ORDER — ACETAMINOPHEN 650 MG RE SUPP: 650.0000 mg | Freq: Four times a day (QID) | RECTAL | Status: DC | PRN

## 2022-06-29 NOTE — Progress Notes (Signed)
[facility] assisted with tele-visit with family 

## 2022-06-29 NOTE — Progress Notes (Signed)
RT and RN transported vent patient 2H ICU.Vital signs stable through out.

## 2022-06-29 NOTE — Progress Notes (Signed)
Pt failed SBT due to no gag, cough or spontaneous breaths. MD aware.

## 2022-06-29 NOTE — Procedures (Signed)
[facility] TeleNeurology Consult Services  Routine EEG Report  Video Performed: Performed  Demographics: Patient Name:   Stacey Dorsey, Stacey Dorsey  Date of Birth:   June 27, 1933  Identification Number:   MRN - [id number]  Study Times:  Study Start Time:   06/29/2022 10:09:00 Study End Time:   06/29/2022 10:33:00  Indication(s): Seizures, Encephalopathy  Technical Summary:  This EEG was performed utilizing standard International 10-20 System of electrode placement. One channel electrocardiogram was monitored. Data were obtained, stored, and interpreted utilizing referential montage recording, with reformatting to longitudinal, transverse bipolar, and referential montages as necessary for interpretation.  State(s):      Coma (not medically induced)  EEG Description:  There was a burst suppression pattern with highly epileptiform bursts (HEBs) lasting 2-4 seconds followed by 4-6 seconds of diffuse suppression (<10uV). The bursts consisted of sharp waves or spikes which were generalized but centrally predominant, with many of these obscured by diffuse EMG activity. There was no reactivity. Spontaneous variability was present.  On video these spikes were very often correlated with myoclonus involving the head, shoulder, and sometimes entire torso.  Impression:  CLASSIFICATION (EEG IMPRESSION):  Abnormal Significance III (coma)  Myoclonic status epilepticus  1a. Burst suppression with highly epileptiform bursts    b. Myoclonus  2. Spikes and sharp waves, generalized, centrally predominant    CLINICAL INTERPRETATION:  There is myoclonic status epilepticus that retreats into burst suppression with sedation (propofol) without resolution of myoclonus. This pattern taken with the patient's history is consistent with post anoxic myoclonus and generally portends a very poor neurologic prognosis.  Neurology consultation and continued goals of care discussion is recommended.  Critical results  called to primary attending Dr. Chestine Spore at 11:38am local time.  Dr Idelle Jo      [facility] For Inpatient follow-up with [facility] physician please call [facility] [phone]. This is not an outpatient service. Post hospital discharge, please contact hospital directly.

## 2022-06-29 NOTE — IPAL (Addendum)
  Interdisciplinary Goals of Care Family Meeting   Date carried out: 06/29/2022  Location of the meeting: Bedside  Member's involved: Physician, Bedside Registered Nurse, and Family Member or next of kin  Durable Power of Attorney or acting medical decision maker: daughter    Discussion: We discussed goals of care for Stacey Dorsey .  I met with Stacey Dorsey's daughter and granddaughter at bedside. She was getting her EEG and had her propofol paused for the EEG during this time. They are upset that she was at her baseline yesterday and was doing well and suddenly had this change. They saw her severe myoclonus and were concerned she is suffering. They agree that additional resuscitation in the event of another cardiac arrest would be unlikely to be beneficial. They are concerned about her suffering. We discussed that she has suffered a significant brain injury if she has severe myoclonus like she is having. They are going to call her other granddaughter to discuss further.   Code status: Full DNR  Disposition: Continue current acute care  Time spent for the meeting: 15 min.    Julian Hy, DO  06/29/2022, 10:40 AM    I received a call from the tele neurologist-- severely abnormal scan associated with a poor prognosis. I discussed this with her daughter and granddaughter. The patient's sister is coming from [place] today, and her daughter wants to withdraw care after she is able to visit. There may be other additional visitors today as well. They want to make sure she is not in pain or uncomfortable. Versed and fentaly gtt ordered.   Julian Hy, DO 06/29/22 11:59 AM [facility]

## 2022-06-29 NOTE — H&P (Signed)
NAME:  Stacey Dorsey, MRN:  [id number], DOB:  July 01, 1933, LOS: 1 ADMISSION DATE:  07-09-22, CONSULTATION DATE:  2022/07/09 REFERRING MD:  EDP, CHIEF COMPLAINT:  cardiac arrest   History of Present Illness:  88 yo esrd (TTS), DM2, HTN, HFpEF, Ao stenosis, COPD, prior L MCA CVA who presented via ems after friend called when pt was suddenly sob and gasping for air. Pt reportedly missed dialysis today as well.   History is obtained from the limited chart as pt is intubated and unresponsive (off sedation) at this time and no family reachable. Per EMS report pt was visiting with friend and suddenly had sob, her friend went to get the patients inhaler and when she retrurned the patient was slumped over in chair. EMS arrived and cpr initiated. She was given epi and rosc achieved but she had second arrest shortly after, given 6 epi and 1 bicarb, cpr was still ongoing upon arrival.   At the time of my examination she is hypertensive with sbp >220 intubated, not on infusions. She is unresponsive to painful stim. No gag, pupils pinpoint and equal but unable to appreciate any reactivity. Her lactate is is elevated at >8, trop elevated, electrolytes all within range despite the missing of dilaysis today. Her cxr shows pulmonary edema and she is wheezing bilaterally.    Pertinent  Medical History  ESRD HTN HFpEF Ao stenosis COPD Prior L MCA Significant Hospital Events: Including procedures, antibiotic start and stop dates in addition to other pertinent events   Cardiac arrest out of hospital 09-Jul-2022 Admitted to hospital 12/2  Interim History / Subjective:  Myoclonus-- suppressed partially on propofol.   Objective   Blood pressure (!) 125/49, pulse 78, temperature 97.7 F (36.5 C), resp. rate 20, height 5\' 6"  (1.676 m), weight 61.5 kg, SpO2 100 %.    Vent Mode: PRVC FiO2 (%):  [40 %-100 %] 50 % Set Rate:  [18 bmp-24 bmp] 18 bmp Vt Set:  [470 mL] 470 mL PEEP:  [5 cmH20] 5 cmH20 Plateau Pressure:   [17 cmH20-18 cmH20] 18 cmH20   Intake/Output Summary (Last 24 hours) at 06/29/2022 0739 Last data filed at 06/29/2022 0600 Gross per 24 hour  Intake 59.43 ml  Output --  Net 59.43 ml   Filed Weights   09-Jul-2022 2057 06/29/22 0343  Weight: 59 kg 61.5 kg    Examination: General: critically ill appearing woman lying in bed in AND HENT: [place]/AT, eyes anicteric, ETT, edentulous Lungs: CTAB, mild brown secretions from ETT Cardiovascular: S1S2, RRR, NSR on tele Abdomen: soft, NT Extremities: no c/c/e Neuro:  apneic on SBT, pupils asymmetric-- larger on R, both reactive. No gag, +tracheal cough. No corneal reflexes. No withdrawal from painful stimulation. Myoclonus periodically. GU: foley in place> very min UOP  Bicarb 22 BUN 46 Cr 7.83 BUN 46 Cr 7.83 WBC 14.5 H/H 10.1/30.4 Platelets 144 CXR personally reviewed> RLL infiltrate, pulm edema   Resolved Hospital Problem list     Assessment & Plan:  OOH cardiac arrest with prolonged resuscitation, concern this was respiratory in origin due to flash pulmonary edema. Initially hypertensive. -supportive care -echo pending   Hypertension; hypotensive with propofol -PRN meds; has pressors as needed for hypotension with propofol -hold PTA antihypertensives  Acute on chronic HFpEF -control hypertension -needs volume off  ESRD hyperphosphatemia -nephrology consult  Myoclonus, acute encephalopathy-- concern for severe anoxic brain injury -CT head, EEG, Neuro consult  Lactic acidosis:  -monitor; need to avoid additional volume in her case -maintain adequate perfusion  Acute hypoxic respiratory failure due to acute pulmonary edema -LTVV -VAP prevention protocol -PAD protocol for sedation -daily SAT & SBT when appropriate -needs volume off  T2 DM, hypergylcemia -SSI PRN -goal BG 140-180  Chronic anemia due to ESRD thrombocytopenia -no current indication for transfusion -monitor  Family coming to bedside soon. Will  update then.  Best Practice (right click and "Reselect all SmartList Selections" daily)   Diet/type: NPO DVT prophylaxis: prophylactic heparin  GI prophylaxis: H2B Lines: Arterial Line Foley:  Yes, and it is no longer needed and removal ordered  Code Status:  full code Last date of multidisciplinary goals of care discussion [ pending ]  Labs   CBC: Recent Labs  Lab 06/28/22 2026 06/28/22 2035 06/28/22 2036 06/28/22 2106 06/29/22 0046 06/29/22 0520  WBC 10.5  --   --   --   --  14.5*  NEUTROABS 2.9  --   --   --   --   --   HGB 10.5* 11.9* 11.9* 11.2* 10.9* 10.1*  HCT 35.3* 35.0* 35.0* 33.0* 32.0* 30.4*  MCV 96.4  --   --   --   --  86.1  PLT 100*  --   --   --   --  144*     Basic Metabolic Panel: Recent Labs  Lab 06/28/22 2025 06/28/22 2026 06/28/22 2026 06/28/22 2035 06/28/22 2036 06/28/22 2106 06/29/22 0046 06/29/22 0520  NA  --  140   < > 138 137 137 139 141  K  --  4.1   < > 4.1 4.1 4.5 4.7 4.2  CL  --  102  --  105  --   --   --  104  CO2  --  22  --   --   --   --   --  22  GLUCOSE  --  301*  --  288*  --   --   --  123*  BUN  --  35*  --  49*  --   --   --  46*  CREATININE 7.58* 7.66*  --  7.70*  --   --   --  7.83*  CALCIUM  --  11.8*  --   --   --   --   --  10.3  MG  --   --   --   --   --   --   --  2.2  PHOS  --   --   --   --   --   --   --  6.0*   < > = values in this interval not displayed.    GFR: Estimated Creatinine Clearance: 4.7 mL/min (A) (by C-G formula based on SCr of 7.83 mg/dL (H)). Recent Labs  Lab 06/28/22 2026 06/29/22 0033 06/29/22 0315 06/29/22 0520  WBC 10.5  --   --  14.5*  LATICACIDVEN 8.7* 3.2* 3.5*  --      Liver Function Tests: Recent Labs  Lab 06/28/22 2026  AST 358*  ALT 301*  ALKPHOS 75  BILITOT 0.3  PROT 5.9*  ALBUMIN 2.9*   No results for input(s): "LIPASE", "AMYLASE" in the last 168 hours. No results for input(s): "AMMONIA" in the last 168 hours.  ABG    Component Value Date/Time   PHART  7.564 (H) 06/29/2022 0046   PCO2ART 21.1 (L) 06/29/2022 0046   PO2ART 229 (H) 06/29/2022 0046   HCO3 19.2 (L) 06/29/2022 0046   TCO2 20 (L)  06/29/2022 0046   ACIDBASEDEF 2.0 06/29/2022 0046   O2SAT 100 06/29/2022 0046     This patient is critically ill with multiple organ system failure which requires frequent high complexity decision making, assessment, support, evaluation, and titration of therapies. This was completed through the application of advanced monitoring technologies and extensive interpretation of multiple databases. During this encounter critical care time was devoted to patient care services described in this note for 34 minutes.  Steffanie Dunn, DO 06/29/22 8:08 AM [facility]

## 2022-06-29 NOTE — Progress Notes (Signed)
EEG completed, results pending. 

## 2022-06-30 DIAGNOSIS — I469 Cardiac arrest, cause unspecified: Secondary | ICD-10-CM | POA: Diagnosis not present

## 2022-06-30 DIAGNOSIS — J9601 Acute respiratory failure with hypoxia: Secondary | ICD-10-CM | POA: Diagnosis not present

## 2022-06-30 LAB — GLUCOSE, CAPILLARY
Glucose-Capillary: 130 mg/dL — ABNORMAL HIGH (ref 70–99)
Glucose-Capillary: 209 mg/dL — ABNORMAL HIGH (ref 70–99)
Glucose-Capillary: 134 mg/dL — ABNORMAL HIGH (ref 70–99)

## 2022-06-30 LAB — HEMOGLOBIN A1C
Hgb A1c MFr Bld: 6.5 % — ABNORMAL HIGH (ref 4.8–5.6)
Mean Plasma Glucose: 140 mg/dL

## 2022-07-01 LAB — CULTURE, RESPIRATORY W GRAM STAIN
Culture: NORMAL
Gram Stain: NONE SEEN

## 2022-07-03 LAB — TYPE AND SCREEN
ABO/RH(D): A POS
Antibody Screen: POSITIVE
Donor AG Type: NEGATIVE
Donor AG Type: NEGATIVE
PT AG Type: NEGATIVE
Unit division: 0
Unit division: 0

## 2022-07-03 LAB — BPAM RBC
Blood Product Expiration Date: 202312212359
Blood Product Expiration Date: 202312242359
Unit Type and Rh: 5100
Unit Type and Rh: 5100

## 2022-07-04 LAB — CULTURE, BLOOD (ROUTINE X 2)
Culture: NO GROWTH
Culture: NO GROWTH

## 2022-07-28 NOTE — Progress Notes (Signed)
Video chat for SO done

## 2022-07-28 NOTE — Progress Notes (Signed)
Pt compassionately extubated with family at bedside.

## 2022-07-28 NOTE — Progress Notes (Signed)
NAME:  Stacey Dorsey, MRN:  [id number], DOB:  09-17-1932, LOS: 2 ADMISSION DATE:  07/06/2022, CONSULTATION DATE:  06/29/2022 REFERRING MD:  EDP, CHIEF COMPLAINT:  cardiac arrest   History of Present Illness:  89 yo esrd (TTS), DM2, HTN, HFpEF, Ao stenosis, COPD, prior L MCA CVA who presented via ems after friend called when pt was suddenly sob and gasping for air. Pt reportedly missed dialysis today as well.   History is obtained from the limited chart as pt is intubated and unresponsive (off sedation) at this time and no family reachable. Per EMS report pt was visiting with friend and suddenly had sob, her friend went to get the patients inhaler and when she retrurned the patient was slumped over in chair. EMS arrived and cpr initiated. She was given epi and rosc achieved but she had second arrest shortly after, given 6 epi and 1 bicarb, cpr was still ongoing upon arrival.   At the time of my examination she is hypertensive with sbp >220 intubated, not on infusions. She is unresponsive to painful stim. No gag, pupils pinpoint and equal but unable to appreciate any reactivity. Her lactate is is elevated at >8, trop elevated, electrolytes all within range despite the missing of dilaysis today. Her cxr shows pulmonary edema and she is wheezing bilaterally.    Pertinent  Medical History  ESRD HTN HFpEF Ao stenosis COPD Prior L MCA Significant Hospital Events: Including procedures, antibiotic start and stop dates in addition to other pertinent events   Cardiac arrest out of hospital 07/20/2022 Admitted to hospital 12/2  Interim History / Subjective:  Remains on Propofol. Family planning on withdrawing today per RN.  Objective   Blood pressure (!) 132/52, pulse 84, temperature 97.9 F (36.6 C), temperature source Axillary, resp. rate 16, height 5\' 6"  (1.676 m), weight 61.5 kg, SpO2 100 %.    Vent Mode: PRVC FiO2 (%):  [30 %] 30 % Set Rate:  [16 bmp-18 bmp] 16 bmp Vt Set:  [470 mL] 470  mL PEEP:  [5 cmH20] 5 cmH20 Plateau Pressure:  [15 cmH20-18 cmH20] 15 cmH20   Intake/Output Summary (Last 24 hours) at 07-14-2022 0907 Last data filed at 2022/07/14 0800 Gross per 24 hour  Intake 1581.48 ml  Output 150 ml  Net 1431.48 ml    Filed Weights   07/15/2022 2057 06/29/22 0343  Weight: 59 kg 61.5 kg    Examination: General: critically ill appearing woman, in NAD HENT: [place]/AT, eyes anicteric, ETT, edentulous Lungs: Normal effort. CTAB Cardiovascular: RRR, no M/R/G Abdomen: BS x 4, S/NT/ND Extremities: Intact, no edema Neuro:  Sedated on vent, unresponsive GU: foley in place> very min UOP  Assessment & Plan:  OOH cardiac arrest with prolonged resuscitation, concern this was respiratory in origin due to flash pulmonary edema. Initially hypertensive. Myoclonus with ongoing acute encephalopathy - concern for severe anoxic brain injury. Acute hypoxic respiratory failure s/p intubation - 2/2 above. Hx Hypertension; hypotensive with propofol. Acute on chronic HFpEF. ESRD.  Discussion: Family has opted to transition to comfort measures once additional family from out of town arrive. Continue supportive care in there interim, no escalation. Once family arrives and has had a chance to visit, will initiate comfort orders.    Best Practice (right click and "Reselect all SmartList Selections" daily)   Diet/type: NPO DVT prophylaxis: prophylactic heparin  GI prophylaxis: H2B Lines: Arterial Line Foley:  Yes, and it is no longer needed and removal ordered  Code Status:  full code Last date  of multidisciplinary goals of care discussion [ pending ]   CC time: 30 min.  Montey Hora, [facility] For pager details, please see AMION or use Epic chat  After 1900, please call [facility] for cross coverage needs 07-20-2022, 9:15 AM

## 2022-07-28 NOTE — Death Summary Note (Signed)
DEATH SUMMARY   Stacey Dorsey Details  Name: Stacey Dorsey MRN: [id number] DOB: [birth year]  Admission/Discharge Information   Admit Date:  2022-07-20  Date of Death: Date of Death: 07/22/22  Time of Death: Time of Death: November 02, 1099  Length of Stay: 2  Referring Physician: Bartholome Bill, MD   Reason(s) for Hospitalization  Out of hospital PEA cardiac arrest likely due to hypoxia Acute hypoxic respiratory failure due to flash pulmonary edema Acute on chronic HFpEF Uncontrolled hypertension End-stage renal disease on hemodialysis Hypophosphatemia Lactic acidosis Diabetes type 2 Probable severe anoxic brain injury Anoxic encephalopathy Status myoclonus Diabetes type 2 with hyperglycemia Anemia of chronic disease  Diagnoses  Preliminary cause of death: Withdrawal of care in the setting of severe anoxic brain injury s/p PEA cardiac arrest she was Secondary Diagnoses (including complications and co-morbidities):  Principal Problem:   Cardiac arrest [facility])   Brief Hospital Course (including significant findings, care, treatment, and services provided and events leading to death)  Rion Quickel is a 89 y.o. year old female with CK esrd (TTS), DM2, HTN, HFpEF, Ao stenosis, COPD, prior L MCA CVA who presented via ems after friend called when pt was suddenly sob and gasping for air. Pt reportedly missed dialysis today as well.    History is obtained from the limited chart as pt is intubated and unresponsive (off sedation) at this time and no family reachable. Per EMS report pt was visiting with friend and suddenly had sob, her friend went to get the patients inhaler and when she retrurned the Stacey Dorsey was slumped over in chair. EMS arrived and cpr initiated. She was given epi and rosc achieved but she had second arrest shortly after, given 6 epi and 1 bicarb, cpr was still ongoing upon arrival.    At the time of my examination she is hypertensive with sbp >220 intubated, not on infusions. She  is unresponsive to painful stim. No gag, pupils pinpoint and equal but unable to appreciate any reactivity. Her lactate is is elevated at >8, trop elevated, electrolytes all within range despite the missing of dilaysis today. Her cxr shows pulmonary edema Stacey Dorsey was noted with follow-up severe myoclonus, EEG confirmed status myoclonus. Goals of care discussions were carried with family, due to poor neurological exam and status myoclonus which was consistent with severe anoxic brain injury s/p PEA cardiac arrest, Stacey Dorsey's family decided to proceed with comfort care and palliative extubation.  Stacey Dorsey was declared dead at 11:01 AM.  Stacey Dorsey's family was at bedside   Pertinent Labs and Studies  Significant Diagnostic Studies CT HEAD WO CONTRAST (5MM)  Result Date: 06/29/2022 CLINICAL DATA:  89 year old female with history of altered level of consciousness. Anoxic brain damage. EXAM: CT HEAD WITHOUT CONTRAST TECHNIQUE: Contiguous axial images were obtained from the base of the skull through the vertex without intravenous contrast. RADIATION DOSE REDUCTION: This exam was performed according to the departmental dose-optimization program which includes automated exposure control, adjustment of the mA and/or kV according to Stacey Dorsey size and/or use of iterative reconstruction technique. COMPARISON:  Head CT 09/12/2021. FINDINGS: Brain: Mild cerebral atrophy. Patchy and confluent areas of decreased attenuation are noted throughout the deep and periventricular white matter of the cerebral hemispheres bilaterally, compatible with chronic microvascular ischemic disease. Focal area of low attenuation in the left frontal lobe with overlying cortical atrophy, similar to prior examination, compatible with encephalomalacia from remote infarct. Physiologic calcifications in the basal ganglia bilaterally. Well-defined focus of low attenuation in the left thalamus, compatible with an old lacunar  infarct. No evidence of acute  infarction, hemorrhage, hydrocephalus, extra-axial collection or mass lesion/mass effect. Vascular: No hyperdense vessel or unexpected calcification. Skull: Normal. Negative for fracture or focal lesion. Sinuses/Orbits: No acute finding. Other: None. IMPRESSION: 1. No acute intracranial abnormalities. 2. Mild cerebral atrophy with chronic microvascular ischemic changes in the cerebral white matter, old left thalamic lacunar infarct, and encephalomalacia in the left frontal lobe from remote infarct. Electronically Signed   By: Vinnie Langton M.D.   On: 06/29/2022 09:22   DG Chest Portable 1 View  Result Date: 07/25/2022 CLINICAL DATA:  Post intubation EXAM: PORTABLE CHEST 1 VIEW COMPARISON:  None Available. FINDINGS: Endotracheal tube tip is 9 mm above the carina. The heart is enlarged there central pulmonary vascular congestion. There is some patchy airspace opacities in the right perihilar region. No pleural effusion. No definite pneumothorax or acute fracture. IMPRESSION: 1. Endotracheal tube tip is 9 mm above the carina. Retraction by 2 cm is recommended. 2. Cardiomegaly with central pulmonary vascular congestion. 3. Patchy airspace opacities in the right perihilar region. Electronically Signed   By: Ronney Asters M.D.   On: 07/12/2022 20:42    Microbiology Recent Results (from the past 240 hour(s))  SARS Coronavirus 2 by RT PCR (hospital order, performed in [facility] hospital lab) *cepheid single result test* Anterior Nasal Swab     Status: None   Collection Time: 07/26/2022  8:27 PM   Specimen: Anterior Nasal Swab  Result Value Ref Range Status   SARS Coronavirus 2 by RT PCR NEGATIVE NEGATIVE Final    Comment: (NOTE) SARS-CoV-2 target nucleic acids are NOT DETECTED.  The SARS-CoV-2 RNA is generally detectable in upper and lower respiratory specimens during the acute phase of infection. The lowest concentration of SARS-CoV-2 viral copies this assay can detect is 250 copies / mL. A negative  result does not preclude SARS-CoV-2 infection and should not be used as the sole basis for treatment or other Stacey Dorsey management decisions.  A negative result may occur with improper specimen collection / handling, submission of specimen other than nasopharyngeal swab, presence of viral mutation(s) within the areas targeted by this assay, and inadequate number of viral copies (<250 copies / mL). A negative result must be combined with clinical observations, Stacey Dorsey history, and epidemiological information.  Fact Sheet for Patients:   https://www.patel.info/  Fact Sheet for Healthcare Providers: https://hall.com/  This test is not yet approved or  cleared by the [place] FDA and has been authorized for detection and/or diagnosis of SARS-CoV-2 by FDA under an Emergency Use Authorization (EUA).  This EUA will remain in effect (meaning this test can be used) for the duration of the COVID-19 declaration under Section 564(b)(1) of the Act, 21 U.S.C. section 360bbb-3(b)(1), unless the authorization is terminated or revoked sooner.  Performed at [facility], [place] [address], [place], [place] [address]   MRSA Next Gen by PCR, Nasal     Status: None   Collection Time: 06/29/22 12:14 AM   Specimen: Nasal Mucosa; Nasal Swab  Result Value Ref Range Status   MRSA by PCR Next Gen NOT DETECTED NOT DETECTED Final    Comment: (NOTE) The GeneXpert MRSA Assay (FDA approved for NASAL specimens only), is one component of a comprehensive MRSA colonization surveillance program. It is not intended to diagnose MRSA infection nor to guide or monitor treatment for MRSA infections. Test performance is not FDA approved in patients less than 41 years old. Performed at [facility], [address] [place], [place] [address]   Culture, Respiratory w Gram Stain (tracheal aspirate)     Status: None   Collection Time: 06/29/22  1:18 AM   Specimen:  Tracheal Aspirate; Respiratory  Result Value Ref Range Status   Specimen Description TRACHEAL ASPIRATE  Final   Special Requests NONE  Final   Gram Stain NO WBC SEEN NO ORGANISMS SEEN   Final   Culture   Final    MODERATE Normal respiratory flora-no Staph aureus or Pseudomonas seen Performed at [facility], [address], [place], [place] [address]    Report Status 07/01/2022 FINAL  Final  Culture, blood (Routine X 2) w Reflex to ID Panel     Status: None (Preliminary result)   Collection Time: 06/29/22  2:39 AM   Specimen: BLOOD  Result Value Ref Range Status   Specimen Description BLOOD RIGHT HAND  Final   Special Requests   Final    BOTTLES DRAWN AEROBIC AND ANAEROBIC Blood Culture results may not be optimal due to an inadequate volume of blood received in culture bottles   Culture   Final    NO GROWTH 2 DAYS Performed at [facility], [place] [address], [place], [place] [address]    Report Status PENDING  Incomplete  Culture, blood (Routine X 2) w Reflex to ID Panel     Status: None (Preliminary result)   Collection Time: 06/29/22  2:43 AM   Specimen: BLOOD  Result Value Ref Range Status   Specimen Description BLOOD RIGHT HAND  Final   Special Requests   Final    BOTTLES DRAWN AEROBIC AND ANAEROBIC Blood Culture results may not be optimal due to an inadequate volume of blood received in culture bottles   Culture   Final    NO GROWTH 2 DAYS Performed at [facility], [place] [address] [place], [place] [address]    Report Status PENDING  Incomplete    Lab Basic Metabolic Panel: Recent Labs  Lab 07/25/2022 2025 07/07/2022 2026 07/19/2022 2026 07/03/2022 2035 07/26/2022 2036 07/12/2022 2106 06/29/22 0046 06/29/22 0520  NA  --  140   < > 138 137 137 139 141  K  --  4.1   < > 4.1 4.1 4.5 4.7 4.2  CL  --  102  --  105  --   --   --  104  CO2  --  22  --   --   --   --   --  22  GLUCOSE  --  301*  --  288*  --   --   --  123*  BUN  --  35*  --  49*  --   --   --   46*  CREATININE 7.58* 7.66*  --  7.70*  --   --   --  7.83*  CALCIUM  --  11.8*  --   --   --   --   --  10.3  MG  --   --   --   --   --   --   --  2.2  PHOS  --   --   --   --   --   --   --  6.0*   < > = values in this interval not displayed.   Liver Function Tests: Recent Labs  Lab 07/19/2022 2026  AST 358*  ALT 301*  ALKPHOS 75  BILITOT 0.3  PROT  5.9*  ALBUMIN 2.9*   No results for input(s): "LIPASE", "AMYLASE" in the last 168 hours. No results for input(s): "AMMONIA" in the last 168 hours. CBC: Recent Labs  Lab 07/06/2022 2026 07/19/2022 2035 07/06/2022 2036 07/26/2022 2106 06/29/22 0046 06/29/22 0520  WBC 10.5  --   --   --   --  14.5*  NEUTROABS 2.9  --   --   --   --   --   HGB 10.5* 11.9* 11.9* 11.2* 10.9* 10.1*  HCT 35.3* 35.0* 35.0* 33.0* 32.0* 30.4*  MCV 96.4  --   --   --   --  86.1  PLT 100*  --   --   --   --  144*   Cardiac Enzymes: No results for input(s): "CKTOTAL", "CKMB", "CKMBINDEX", "TROPONINI" in the last 168 hours. Sepsis Labs: Recent Labs  Lab 07/14/2022 2026 06/29/22 0033 06/29/22 0315 06/29/22 0520  WBC 10.5  --   --  14.5*  LATICACIDVEN 8.7* 3.2* 3.5*  --     Procedures/Operations     [employer] 07/01/2022, 4:59 PM

## 2022-07-28 NOTE — Progress Notes (Signed)
Time of death 1101, confirmed by two RN's.

## 2022-07-28 NOTE — Progress Notes (Signed)
Pt apneic without full vent support.   09-Jul-2022 0751  Adult Ventilator Settings  Vent Mode PRVC  Vt Set 470 mL  Set Rate 16 bmp  FiO2 (%) 30 %  I Time 0.9 Sec(s)  PEEP 5 cmH20  Adult Ventilator Measurements  Peak Airway Pressure 19 L/min  Mean Airway Pressure 8 cmH20  Resp Rate Spontaneous 0 br/min  Resp Rate Total 16 br/min  Exhaled Vt 462 mL  Measured Ve 6 mL  I:E Ratio Measured 1:3.1  SpO2 100 %

## 2022-07-28 DEATH — deceased
